# Patient Record
Sex: Female | Born: 1950 | Race: Black or African American | Hispanic: No | Smoking: Never smoker
Health system: Southern US, Community
[De-identification: ages and names within clinical notes are randomized; demographics above are authoritative.]

## PROBLEM LIST (undated history)

## (undated) DIAGNOSIS — G8929 Other chronic pain: Secondary | ICD-10-CM

## (undated) DIAGNOSIS — M069 Rheumatoid arthritis, unspecified: Secondary | ICD-10-CM

## (undated) DIAGNOSIS — Z973 Presence of spectacles and contact lenses: Secondary | ICD-10-CM

## (undated) DIAGNOSIS — I7300 Raynaud's syndrome without gangrene: Secondary | ICD-10-CM

## (undated) DIAGNOSIS — M797 Fibromyalgia: Secondary | ICD-10-CM

## (undated) DIAGNOSIS — M170 Bilateral primary osteoarthritis of knee: Secondary | ICD-10-CM

## (undated) DIAGNOSIS — K08109 Complete loss of teeth, unspecified cause, unspecified class: Secondary | ICD-10-CM

## (undated) DIAGNOSIS — I10 Essential (primary) hypertension: Secondary | ICD-10-CM

## (undated) DIAGNOSIS — M25512 Pain in left shoulder: Secondary | ICD-10-CM

## (undated) DIAGNOSIS — Z9889 Other specified postprocedural states: Secondary | ICD-10-CM

## (undated) DIAGNOSIS — F419 Anxiety disorder, unspecified: Secondary | ICD-10-CM

## (undated) DIAGNOSIS — Z9221 Personal history of antineoplastic chemotherapy: Secondary | ICD-10-CM

## (undated) DIAGNOSIS — R112 Nausea with vomiting, unspecified: Secondary | ICD-10-CM

## (undated) DIAGNOSIS — M5030 Other cervical disc degeneration, unspecified cervical region: Secondary | ICD-10-CM

## (undated) DIAGNOSIS — C50919 Malignant neoplasm of unspecified site of unspecified female breast: Secondary | ICD-10-CM

## (undated) DIAGNOSIS — Z972 Presence of dental prosthetic device (complete) (partial): Secondary | ICD-10-CM

## (undated) DIAGNOSIS — R55 Syncope and collapse: Secondary | ICD-10-CM

## (undated) HISTORY — PX: CHOLECYSTECTOMY: SHX55

## (undated) HISTORY — PX: COLONOSCOPY: SHX174

---

## 1978-10-13 HISTORY — PX: VAGINAL HYSTERECTOMY: SUR661

## 2005-10-11 ENCOUNTER — Emergency Department (HOSPITAL_COMMUNITY): Admission: EM | Admit: 2005-10-11 | Discharge: 2005-10-11 | Payer: Self-pay | Admitting: Emergency Medicine

## 2006-08-23 ENCOUNTER — Emergency Department (HOSPITAL_COMMUNITY): Admission: EM | Admit: 2006-08-23 | Discharge: 2006-08-23 | Payer: Self-pay | Admitting: Emergency Medicine

## 2006-11-05 ENCOUNTER — Ambulatory Visit (HOSPITAL_COMMUNITY): Admission: RE | Admit: 2006-11-05 | Discharge: 2006-11-05 | Payer: Self-pay | Admitting: Obstetrics and Gynecology

## 2006-12-16 ENCOUNTER — Ambulatory Visit (HOSPITAL_COMMUNITY): Admission: RE | Admit: 2006-12-16 | Discharge: 2006-12-16 | Payer: Self-pay | Admitting: Gastroenterology

## 2007-07-04 ENCOUNTER — Emergency Department (HOSPITAL_COMMUNITY): Admission: EM | Admit: 2007-07-04 | Discharge: 2007-07-05 | Payer: Self-pay | Admitting: Emergency Medicine

## 2008-08-28 ENCOUNTER — Emergency Department (HOSPITAL_COMMUNITY): Admission: EM | Admit: 2008-08-28 | Discharge: 2008-08-28 | Payer: Self-pay | Admitting: Emergency Medicine

## 2008-08-29 ENCOUNTER — Encounter: Admission: RE | Admit: 2008-08-29 | Discharge: 2008-08-29 | Payer: Self-pay | Admitting: Internal Medicine

## 2008-12-27 ENCOUNTER — Emergency Department (HOSPITAL_COMMUNITY): Admission: EM | Admit: 2008-12-27 | Discharge: 2008-12-27 | Payer: Self-pay | Admitting: Family Medicine

## 2009-01-01 ENCOUNTER — Other Ambulatory Visit: Admission: RE | Admit: 2009-01-01 | Discharge: 2009-01-01 | Payer: Self-pay | Admitting: Obstetrics and Gynecology

## 2009-01-05 ENCOUNTER — Ambulatory Visit (HOSPITAL_COMMUNITY): Admission: RE | Admit: 2009-01-05 | Discharge: 2009-01-05 | Payer: Self-pay | Admitting: Obstetrics and Gynecology

## 2009-03-18 ENCOUNTER — Emergency Department (HOSPITAL_COMMUNITY): Admission: EM | Admit: 2009-03-18 | Discharge: 2009-03-18 | Payer: Self-pay | Admitting: Emergency Medicine

## 2009-08-29 ENCOUNTER — Encounter: Admission: RE | Admit: 2009-08-29 | Discharge: 2009-08-29 | Payer: Self-pay | Admitting: Family Medicine

## 2009-09-04 ENCOUNTER — Encounter: Admission: RE | Admit: 2009-09-04 | Discharge: 2009-09-04 | Payer: Self-pay | Admitting: Family Medicine

## 2010-03-04 ENCOUNTER — Ambulatory Visit: Payer: Self-pay | Admitting: Internal Medicine

## 2010-03-04 DIAGNOSIS — E119 Type 2 diabetes mellitus without complications: Secondary | ICD-10-CM | POA: Insufficient documentation

## 2010-03-04 DIAGNOSIS — R609 Edema, unspecified: Secondary | ICD-10-CM | POA: Insufficient documentation

## 2010-03-04 DIAGNOSIS — M2550 Pain in unspecified joint: Secondary | ICD-10-CM | POA: Insufficient documentation

## 2010-03-05 ENCOUNTER — Encounter: Payer: Self-pay | Admitting: Internal Medicine

## 2010-03-07 ENCOUNTER — Ambulatory Visit: Payer: Self-pay | Admitting: Internal Medicine

## 2010-03-07 DIAGNOSIS — R799 Abnormal finding of blood chemistry, unspecified: Secondary | ICD-10-CM | POA: Insufficient documentation

## 2010-03-07 LAB — CONVERTED CEMR LAB
CRP, High Sensitivity: 10.52 — ABNORMAL HIGH (ref 0.00–5.00)
Total CK: 104 units/L (ref 7–177)

## 2010-04-12 ENCOUNTER — Telehealth: Payer: Self-pay | Admitting: Internal Medicine

## 2010-06-21 ENCOUNTER — Emergency Department (HOSPITAL_COMMUNITY): Admission: EM | Admit: 2010-06-21 | Discharge: 2010-06-21 | Payer: Self-pay | Admitting: Emergency Medicine

## 2010-08-24 ENCOUNTER — Emergency Department (HOSPITAL_COMMUNITY): Admission: EM | Admit: 2010-08-24 | Discharge: 2010-08-24 | Payer: Self-pay | Admitting: Emergency Medicine

## 2010-09-26 ENCOUNTER — Encounter (INDEPENDENT_AMBULATORY_CARE_PROVIDER_SITE_OTHER): Payer: Self-pay | Admitting: Family Medicine

## 2010-09-26 LAB — CONVERTED CEMR LAB
ALT: 14 units/L (ref 0–35)
AST: 18 units/L (ref 0–37)
Albumin: 5.1 g/dL (ref 3.5–5.2)
Alkaline Phosphatase: 135 units/L — ABNORMAL HIGH (ref 39–117)
Anti Nuclear Antibody(ANA): NEGATIVE
BUN: 10 mg/dL (ref 6–23)
Basophils Absolute: 0 10*3/uL (ref 0.0–0.1)
Basophils Relative: 0 % (ref 0–1)
CO2: 26 meq/L (ref 19–32)
Calcium: 10 mg/dL (ref 8.4–10.5)
Chloride: 103 meq/L (ref 96–112)
Creatinine, Ser: 0.82 mg/dL (ref 0.40–1.20)
Eosinophils Absolute: 0.1 10*3/uL (ref 0.0–0.7)
Eosinophils Relative: 1 % (ref 0–5)
Glucose, Bld: 72 mg/dL (ref 70–99)
HCT: 47.2 % — ABNORMAL HIGH (ref 36.0–46.0)
Hemoglobin: 15.1 g/dL — ABNORMAL HIGH (ref 12.0–15.0)
Lymphocytes Relative: 27 % (ref 12–46)
Lymphs Abs: 1.9 10*3/uL (ref 0.7–4.0)
MCHC: 32 g/dL (ref 30.0–36.0)
MCV: 86.6 fL (ref 78.0–100.0)
Monocytes Absolute: 0.5 10*3/uL (ref 0.1–1.0)
Monocytes Relative: 7 % (ref 3–12)
Neutro Abs: 4.7 10*3/uL (ref 1.7–7.7)
Neutrophils Relative %: 65 % (ref 43–77)
Platelets: 206 10*3/uL (ref 150–400)
Potassium: 3.8 meq/L (ref 3.5–5.3)
RBC: 5.45 M/uL — ABNORMAL HIGH (ref 3.87–5.11)
RDW: 14.9 % (ref 11.5–15.5)
Rheumatoid fact SerPl-aCnc: <20 intl units/mL (ref 0–20)
Sodium: 143 meq/L (ref 135–145)
Total Bilirubin: 0.4 mg/dL (ref 0.3–1.2)
Total Protein: 8.8 g/dL — ABNORMAL HIGH (ref 6.0–8.3)
WBC: 7.2 10*3/uL (ref 4.0–10.5)

## 2010-10-24 ENCOUNTER — Ambulatory Visit: Admit: 2010-10-24 | Payer: Self-pay | Admitting: Nurse Practitioner

## 2010-11-10 LAB — CONVERTED CEMR LAB
ALT: 20 units/L (ref 0–35)
AST: 23 units/L (ref 0–37)
Albumin: 4 g/dL (ref 3.5–5.2)
Alkaline Phosphatase: 103 units/L (ref 39–117)
Anti Nuclear Antibody(ANA): NEGATIVE
BUN: 12 mg/dL (ref 6–23)
Basophils Absolute: 0 10*3/uL (ref 0.0–0.1)
Basophils Relative: 0.3 % (ref 0.0–3.0)
Bilirubin Urine: NEGATIVE
Bilirubin, Direct: 0.1 mg/dL (ref 0.0–0.3)
CO2: 32 meq/L (ref 19–32)
CRP, High Sensitivity: 12.19 — ABNORMAL HIGH (ref 0.00–5.00)
Calcium: 9.1 mg/dL (ref 8.4–10.5)
Chloride: 105 meq/L (ref 96–112)
Creatinine, Ser: 0.8 mg/dL (ref 0.4–1.2)
Eosinophils Absolute: 0.2 10*3/uL (ref 0.0–0.7)
Eosinophils Relative: 2 % (ref 0.0–5.0)
GFR calc non Af Amer: 89.95 mL/min (ref >60)
Glucose, Bld: 93 mg/dL (ref 70–99)
HCT: 38.6 % (ref 36.0–46.0)
Hemoglobin: 13 g/dL (ref 12.0–15.0)
Hgb A1c MFr Bld: 6 % (ref 4.6–6.5)
Ketones, ur: NEGATIVE mg/dL
Lymphocytes Relative: 25.6 % (ref 12.0–46.0)
Lymphs Abs: 2.1 10*3/uL (ref 0.7–4.0)
MCHC: 33.8 g/dL (ref 30.0–36.0)
MCV: 84.6 fL (ref 78.0–100.0)
Monocytes Absolute: 0.8 10*3/uL (ref 0.1–1.0)
Monocytes Relative: 9.1 % (ref 3.0–12.0)
Neutro Abs: 5.2 10*3/uL (ref 1.4–7.7)
Neutrophils Relative %: 63 % (ref 43.0–77.0)
Nitrite: NEGATIVE
Pap Smear: NORMAL
Platelets: 161 10*3/uL (ref 150.0–400.0)
Potassium: 4.3 meq/L (ref 3.5–5.1)
RBC: 4.56 M/uL (ref 3.87–5.11)
RDW: 13.5 % (ref 11.5–14.6)
Rheumatoid fact SerPl-aCnc: 20 intl units/mL (ref 0.0–20.0)
Sed Rate: 13 mm/hr (ref 0–22)
Sodium: 142 meq/L (ref 135–145)
Specific Gravity, Urine: <=1.005 (ref 1.000–1.030)
TSH: 1.04 microintl units/mL (ref 0.35–5.50)
Total Bilirubin: 0.7 mg/dL (ref 0.3–1.2)
Total Protein, Urine: NEGATIVE mg/dL
Total Protein: 7.2 g/dL (ref 6.0–8.3)
Urine Glucose: NEGATIVE mg/dL
Urobilinogen, UA: 0.2 (ref 0.0–1.0)
WBC: 8.3 10*3/uL (ref 4.5–10.5)
pH: 6 (ref 5.0–8.0)

## 2010-11-12 ENCOUNTER — Encounter: Payer: Self-pay | Admitting: Internal Medicine

## 2010-11-12 LAB — CONVERTED CEMR LAB
CRP: 1 mg/dL — ABNORMAL HIGH (ref <0.6)
HCV Ab: NEGATIVE
Hep A Total Ab: NEGATIVE
Hep B Core Total Ab: NEGATIVE
Hep B S Ab: NEGATIVE
Sed Rate: 4 mm/hr (ref 0–22)
TSH: 1.331 microintl units/mL (ref 0.350–4.500)
Total CK: 131 units/L (ref 7–177)
Vit D, 25-Hydroxy: 21 ng/mL — ABNORMAL LOW (ref 30–89)
Vitamin B-12: 303 pg/mL (ref 211–911)

## 2010-11-13 ENCOUNTER — Other Ambulatory Visit (HOSPITAL_COMMUNITY): Payer: Self-pay | Admitting: Family Medicine

## 2010-11-13 ENCOUNTER — Ambulatory Visit (HOSPITAL_COMMUNITY)
Admission: RE | Admit: 2010-11-13 | Discharge: 2010-11-13 | Disposition: A | Discharge status: Home / Self Care | Payer: Self-pay | Source: Ambulatory Visit

## 2010-11-13 ENCOUNTER — Other Ambulatory Visit: Payer: Self-pay

## 2010-11-13 ENCOUNTER — Ambulatory Visit (HOSPITAL_COMMUNITY)
Admission: RE | Admit: 2010-11-13 | Discharge: 2010-11-13 | Disposition: A | Discharge status: Home / Self Care | Payer: Self-pay | Source: Ambulatory Visit | Attending: Family Medicine | Admitting: Family Medicine

## 2010-11-13 ENCOUNTER — Ambulatory Visit (HOSPITAL_COMMUNITY)
Admission: RE | Admit: 2010-11-13 | Discharge: 2010-11-13 | Disposition: A | Discharge status: Home / Self Care | Payer: Self-pay | Source: Ambulatory Visit | Attending: Internal Medicine | Admitting: Internal Medicine

## 2010-11-13 DIAGNOSIS — R52 Pain, unspecified: Secondary | ICD-10-CM

## 2010-11-13 DIAGNOSIS — M25549 Pain in joints of unspecified hand: Secondary | ICD-10-CM | POA: Insufficient documentation

## 2010-11-13 DIAGNOSIS — M25539 Pain in unspecified wrist: Secondary | ICD-10-CM | POA: Insufficient documentation

## 2010-11-14 NOTE — Letter (Signed)
Summary: Results Follow-up Letter  [facility]  [address] [place], [place] [address]   Phone: [phone]  Fax: [phone]    03/05/2010  [address] [place], [place]  [address]  Dear Ms. HEDGEPETH,   The following are the results of your recent test(s):  Test       Result     Liver/kidney     normal CBC         normal Urine         normal Inflammation test     high Blood sugars     very slightly elevated Thyroid       normal   _________________________________________________________  Please call for an appointment soon _________________________________________________________ _________________________________________________________ _________________________________________________________  Sincerely,  Sanda Linger MD [facility]

## 2010-11-14 NOTE — Assessment & Plan Note (Signed)
Summary: HAD A LETTER TO COME IN/NWS   Vital Signs:  Patient profile:   60 year old female Height:      60 inches Weight:      159.75 pounds BMI:     31.31 O2 Sat:      97 % on Room air Temp:     97.5 degrees F oral Pulse rate:   86 / minute Pulse rhythm:   regular Resp:     16 per minute BP sitting:   128 / 82  (left arm) Cuff size:   large  Vitals Entered By: Rock Nephew CMA (Mar 07, 2010 9:59 AM)  Nutrition Counseling: Patient's BMI is greater than 25 and therefore counseled on weight management options.  O2 Flow:  Room air  Primary Care Provider:  Etta Grandchild MD   History of Present Illness: She returns for f/up on joint  pains ( hands, elbows, feet, knees ) and a rare episode of muscle aches in her arms. Her CRP was slightly elevated but ANA and RF were negative. She has a hx. of OA and her sister has had surgery for OA in her knees.  Preventive Screening-Counseling & Management  Alcohol-Tobacco     Alcohol drinks/day: 0     Alcohol Counseling: not indicated; use of alcohol is not excessive or problematic     Smoking Status: never  Caffeine-Diet-Exercise     Does Patient Exercise: yes  Hep-HIV-STD-Contraception     Hepatitis Risk: no risk noted     HIV Risk: no risk noted     STD Risk: no risk noted      Sexual History:  currently monogamous.        Drug Use:  never and no.        Blood Transfusions:  no.    Current Medications (verified): 1)  Neurontin 100 Mg Caps (Gabapentin) .... 3 Tab Three Times A Day 2)  Potassium 3)  Vitamin D (Ergocalciferol) 50000 Unit Caps (Ergocalciferol) .... Once Weekly  Allergies (verified): 1)  ! Codeine  Past History:  Past Medical History: Reviewed history from 03/04/2010 and no changes required. Diabetes mellitus, type II  Past Surgical History: Cholecystectomy Hysterectomy Lumpectomy  Family History: Reviewed history and no changes required. Family History of Arthritis Family History Breast cancer  1st degree relative <50 Family History Diabetes 1st degree relative Family History High cholesterol Family History Hypertension  Social History: Reviewed history and no changes required. Occupation: CNA at [employer] Divorced Never Smoked Alcohol use-no Drug use-no Regular exercise-yes Drug Use:  never, no Does Patient Exercise:  yes  Review of Systems MS:  Complains of joint pain, muscle aches, and stiffness; denies joint redness, joint swelling, loss of strength, low back pain, cramps, muscle weakness, and thoracic pain.  Physical Exam  General:  alert, well-developed, well-nourished, well-hydrated, normal appearance, healthy-appearing, cooperative to examination, good hygiene, and overweight-appearing.   Mouth:  Oral mucosa and oropharynx without lesions or exudates.  Teeth in good repair. Neck:  supple, full ROM, no masses, no thyromegaly, no thyroid nodules or tenderness, no JVD, normal carotid upstroke, no carotid bruits, no cervical lymphadenopathy, and no neck tenderness.   Lungs:  normal respiratory effort, no intercostal retractions, no accessory muscle use, normal breath sounds, no dullness, no fremitus, no crackles, and no wheezes.   Heart:  normal rate, regular rhythm, no murmur, no gallop, no rub, and no JVD.   Abdomen:  soft, non-tender, normal bowel sounds, no distention, no masses, no guarding,  no rigidity, no rebound tenderness, no abdominal hernia, no hepatomegaly, and no splenomegaly.   Msk:  normal ROM, no joint tenderness, no joint swelling, no joint warmth, no redness over joints, no joint deformities, no joint instability, no crepitation, and no muscle atrophy.   Pulses:  R and L carotid,radial,femoral,dorsalis pedis and posterior tibial pulses are full and equal bilaterally Extremities:  No clubbing, cyanosis, edema, or deformity noted with normal full range of motion of all joints.   Neurologic:  No cranial nerve deficits noted. Station and gait are  normal. Plantar reflexes are down-going bilaterally. DTRs are symmetrical throughout. Sensory, motor and coordinative functions appear intact. Skin:  turgor normal, color normal, no rashes, no suspicious lesions, no ecchymoses, no petechiae, no purpura, no ulcerations, no edema, and tattoo(s).   Cervical Nodes:  no anterior cervical adenopathy and no posterior cervical adenopathy.   Axillary Nodes:  no R axillary adenopathy and no L axillary adenopathy.   Psych:  Cognition and judgment appear intact. Alert and cooperative with normal attention span and concentration. No apparent delusions, illusions, hallucinations  Diabetes Management Exam:    Foot Exam (with socks and/or shoes not present):       Sensory-Pinprick/Light touch:          Left medial foot (L-4): normal          Left dorsal foot (L-5): normal          Left lateral foot (S-1): normal          Right medial foot (L-4): normal          Right dorsal foot (L-5): normal          Right lateral foot (S-1): normal       Sensory-Monofilament:          Left foot: normal          Right foot: normal       Inspection:          Left foot: normal          Right foot: normal       Nails:          Left foot: normal          Right foot: normal   Impression & Recommendations:  Problem # 1:  C-REACTIVE PROTEIN, SERUM, ELEVATED (ICD-790.99) Assessment New  Orders: Venipuncture (78295) TLB-CRP-High Sensitivity (C-Reactive Protein) (86140-FCRP) TLB-CK Total Only(Creatine Kinase/CPK) (82550-CK) T-Hand Left 3 Views (73130TC)  Problem # 2:  ARTHRALGIA (ICD-719.40) Assessment: Unchanged try tramadol with the aleve she already takes. Orders: Venipuncture (62130) TLB-CRP-High Sensitivity (C-Reactive Protein) (86140-FCRP) TLB-CK Total Only(Creatine Kinase/CPK) (82550-CK) T-Hand Left 3 Views (73130TC)  Complete Medication List: 1)  Neurontin 100 Mg Caps (Gabapentin) .... 3 tab three times a day 2)  Potassium  3)  Vitamin D  (ergocalciferol) 50000 Unit Caps (Ergocalciferol) .... Once weekly 4)  Tramadol Hcl 50 Mg Tabs (Tramadol hcl) .Marland Kitchen.. 1-2 by mouth qid as needed for pain  Patient Instructions: 1)  Please schedule a follow-up appointment in 2 months. 2)  It is important that you exercise regularly at least 20 minutes 5 times a week. If you develop chest pain, have severe difficulty breathing, or feel very tired , stop exercising immediately and seek medical attention. 3)  You need to lose weight. Consider a lower calorie diet and regular exercise.  4)  Check your blood sugars regularly. If your readings are usually above 200 or below 70 you should contact our office. 5)  It is important that your Diabetic A1c level is checked every 3 months. 6)  See your eye doctor yearly to check for diabetic eye damage. 7)  Check your feet each night for sore areas, calluses or signs of infection. Prescriptions: TRAMADOL HCL 50 MG TABS (TRAMADOL HCL) 1-2 by mouth QID as needed for pain  #75 x 5   Entered and Authorized by:   Etta Grandchild MD   Signed by:   Etta Grandchild MD on 03/07/2010   Method used:   Print then Give to Patient   RxID:   [id number]

## 2010-11-14 NOTE — Progress Notes (Signed)
  Phone Note Call from Patient Call back at [facility] Phone [phone]   Caller: Patient Reason for Call: Lab or Test Results Summary of Call: Patietn called requesting recent lab and xray results. Please advise Initial call taken by: Rock Nephew CMA,  April 12, 2010 10:30 AM  Follow-up for Phone Call        inflammation test was elevated, xray was normal Follow-up by: Etta Grandchild MD,  April 12, 2010 10:40 AM  Additional Follow-up for Phone Call Additional follow up Details #1::        Left detailed vm for pt Additional Follow-up by: Lamar Sprinkles, CMA,  April 12, 2010 4:15 PM

## 2010-11-14 NOTE — Assessment & Plan Note (Signed)
Summary: NEW/ [employer] /NWS  #   Vital Signs:  Patient profile:   60 year old female Height:      60 inches Weight:      161.50 pounds BMI:     31.65 O2 Sat:      98 % on Room air Temp:     98.8 degrees F oral Pulse rate:   98 / minute Pulse rhythm:   regular Resp:     16 per minute BP sitting:   112 / 80  (left arm) Cuff size:   large  Vitals Entered By: Rock Nephew CMA (Mar 04, 2010 3:07 PM)  Nutrition Counseling: Patient's BMI is greater than 25 and therefore counseled on weight management options.  O2 Flow:  Room air CC: Upper and LoweBilateral extremity swelling and pain, Preventive Care Is Patient Diabetic? No   Primary Care Provider:  Etta Grandchild MD  CC:  Upper and LoweBilateral extremity swelling and pain and Preventive Care.  History of Present Illness: New to me she complains of a 2 month hx. of pain/swelling in hands (MCP joints), wrists, and elbows and some non-painful ankle swelling. She has been seen by ? Neurologist and though she says that her NCS/EMG was normal she has been treated for Neuropathy. No prior records are available today.  Preventive Screening-Counseling & Management  Alcohol-Tobacco     Alcohol drinks/day: 0     Smoking Status: never  Hep-HIV-STD-Contraception     Hepatitis Risk: no risk noted     HIV Risk: no risk noted     STD Risk: no risk noted      Sexual History:  currently monogamous.        Drug Use:  never.        Blood Transfusions:  no.    Medications Prior to Update: 1)  None  Current Medications (verified): 1)  Neurontin 100 Mg Caps (Gabapentin) .... 3 Tab Three Times A Day 2)  Potassium 3)  Vitamin D (Ergocalciferol) 50000 Unit Caps (Ergocalciferol) .... Once Weekly  Allergies (verified): 1)  ! Codeine  Past History:  Past Medical History: Diabetes mellitus, type II  Social History: Smoking Status:  never Hepatitis Risk:  no risk noted HIV Risk:  no risk noted STD Risk:  no risk noted Sexual  History:  currently monogamous Drug Use:  never Blood Transfusions:  no  Review of Systems       The patient complains of peripheral edema.  The patient denies anorexia, fever, weight loss, weight gain, chest pain, syncope, dyspnea on exertion, prolonged cough, headaches, hemoptysis, abdominal pain, hematuria, muscle weakness, suspicious skin lesions, difficulty walking, and enlarged lymph nodes.   MS:  Complains of joint pain; denies joint redness, joint swelling, loss of strength, low back pain, mid back pain, muscle aches, cramps, muscle weakness, stiffness, and thoracic pain.  Physical Exam  General:  alert, well-developed, well-nourished, well-hydrated, normal appearance, healthy-appearing, cooperative to examination, good hygiene, and overweight-appearing.   Head:  normocephalic, atraumatic, no abnormalities observed, and no abnormalities palpated.   Eyes:  vision grossly intact, pupils equal, pupils round, pupils reactive to light, and pupils react to accomodation.   Mouth:  Oral mucosa and oropharynx without lesions or exudates.  Teeth in good repair. Neck:  supple, full ROM, no masses, no thyromegaly, no thyroid nodules or tenderness, no JVD, normal carotid upstroke, no carotid bruits, no cervical lymphadenopathy, and no neck tenderness.   Lungs:  normal respiratory effort, no intercostal retractions, no accessory muscle  use, normal breath sounds, no dullness, no fremitus, no crackles, and no wheezes.   Heart:  normal rate, regular rhythm, no murmur, no gallop, no rub, and no JVD.   Abdomen:  soft, non-tender, normal bowel sounds, no distention, no masses, no guarding, no rigidity, no rebound tenderness, no abdominal hernia, no hepatomegaly, and no splenomegaly.   Msk:  normal ROM, no joint tenderness, no joint swelling, no joint warmth, no redness over joints, no joint deformities, no joint instability, no crepitation, and no muscle atrophy.   Pulses:  R and L  carotid,radial,femoral,dorsalis pedis and posterior tibial pulses are full and equal bilaterally Extremities:  No clubbing, cyanosis, edema, or deformity noted with normal full range of motion of all joints.   Neurologic:  No cranial nerve deficits noted. Station and gait are normal. Plantar reflexes are down-going bilaterally. DTRs are symmetrical throughout. Sensory, motor and coordinative functions appear intact. Skin:  Intact without suspicious lesions or rashes Cervical Nodes:  No lymphadenopathy noted Psych:  Cognition and judgment appear intact. Alert and cooperative with normal attention span and concentration. No apparent delusions, illusions, hallucinations Additional Exam:  EKG is normal.  Diabetes Management Exam:    Foot Exam (with socks and/or shoes not present):       Sensory-Pinprick/Light touch:          Left medial foot (L-4): normal          Left dorsal foot (L-5): normal          Left lateral foot (S-1): normal          Right medial foot (L-4): normal          Right dorsal foot (L-5): normal          Right lateral foot (S-1): normal       Sensory-Monofilament:          Left foot: normal          Right foot: normal       Inspection:          Left foot: normal          Right foot: normal       Nails:          Left foot: normal          Right foot: normal   Impression & Recommendations:  Problem # 1:  EDEMA (ICD-782.3) Assessment New exam is normal today but will look for secondary causes Orders: Venipuncture (59563) TLB-BMP (Basic Metabolic Panel-BMET) (80048-METABOL) TLB-CBC Platelet - w/Differential (85025-CBCD) TLB-Hepatic/Liver Function Pnl (80076-HEPATIC) TLB-TSH (Thyroid Stimulating Hormone) (84443-TSH) TLB-A1C / Hgb A1C (Glycohemoglobin) (83036-A1C) TLB-Rheumatoid Factor (RA) (87564-PP) T-Antinuclear Antib (ANA) (29518-84166) TLB-CRP-High Sensitivity (C-Reactive Protein) (86140-FCRP) TLB-Sedimentation Rate (ESR) (85652-ESR) TLB-Udip w/ Micro  (81001-URINE) EKG w/ Interpretation (93000)  Problem # 2:  ARTHRALGIA (ICD-719.40) Assessment: New will look for inflammatory arhtropathy Orders: Venipuncture (06301) TLB-BMP (Basic Metabolic Panel-BMET) (80048-METABOL) TLB-CBC Platelet - w/Differential (85025-CBCD) TLB-Hepatic/Liver Function Pnl (80076-HEPATIC) TLB-TSH (Thyroid Stimulating Hormone) (84443-TSH) TLB-A1C / Hgb A1C (Glycohemoglobin) (83036-A1C) TLB-Rheumatoid Factor (RA) (60109-NA) T-Antinuclear Antib (ANA) (35573-22025) TLB-CRP-High Sensitivity (C-Reactive Protein) (86140-FCRP) TLB-Sedimentation Rate (ESR) (85652-ESR) TLB-Udip w/ Micro (81001-URINE)  Problem # 3:  DIABETES MELLITUS, TYPE II (ICD-250.00) Assessment: Unchanged  Orders: Venipuncture (42706) TLB-BMP (Basic Metabolic Panel-BMET) (80048-METABOL) TLB-CBC Platelet - w/Differential (85025-CBCD) TLB-Hepatic/Liver Function Pnl (80076-HEPATIC) TLB-TSH (Thyroid Stimulating Hormone) (84443-TSH) TLB-A1C / Hgb A1C (Glycohemoglobin) (83036-A1C) TLB-Rheumatoid Factor (RA) (23762-GB) T-Antinuclear Antib (ANA) (15176-16073) TLB-CRP-High Sensitivity (C-Reactive Protein) (86140-FCRP) TLB-Sedimentation Rate (ESR) (85652-ESR) TLB-Udip w/ Micro (81001-URINE)  Complete Medication List: 1)  Neurontin 100 Mg Caps (Gabapentin) .... 3 tab three times a day 2)  Potassium  3)  Vitamin D (ergocalciferol) 50000 Unit Caps (Ergocalciferol) .... Once weekly  PAP Screening:    Hx Cervical Dysplasia in last 5 yrs? No    3 normal PAP smears in last 5 yrs? Yes    Last PAP smear:  01/11/2009  Mammogram Screening:    Last Mammogram:  03/13/2009  Osteoporosis Risk Assessment:  Risk Factors for Fracture or Low Bone Density:   Smoking status:       never  Patient Instructions: 1)  Limit your Sodium (Salt) to less than 4 grams a day (slightly less than 1 teaspoon) to prevent fluid retention, swelling, or worsening or symptoms. 2)  It is important that you exercise regularly at  least 20 minutes 5 times a week. If you develop chest pain, have severe difficulty breathing, or feel very tired , stop exercising immediately and seek medical attention. 3)  You need to lose weight. Consider a lower calorie diet and regular exercise.  4)  Check your Blood Pressure regularly. If it is above 140/90: you should make an appointment.  Preventive Care Screening  Colonoscopy:    Date:  03/13/2009    Results:  normal   Mammogram:    Date:  03/13/2009    Results:  normal   Pap Smear:    Date:  01/11/2009    Results:  normal

## 2010-12-26 LAB — GLUCOSE, CAPILLARY: Glucose-Capillary: 112 mg/dL — ABNORMAL HIGH (ref 70–99)

## 2011-01-31 ENCOUNTER — Other Ambulatory Visit (HOSPITAL_COMMUNITY): Payer: Self-pay | Admitting: Family Medicine

## 2011-01-31 DIAGNOSIS — R52 Pain, unspecified: Secondary | ICD-10-CM

## 2011-02-28 NOTE — Op Note (Signed)
NAMEEMMALIA, Angela Gross           ACCOUNT NO.:  [id number]   MEDICAL RECORD NO.:  [id number]          PATIENT TYPE:  AMB   LOCATION:  ENDO                         FACILITY:  [facility]   PHYSICIAN:  Anselmo Rod, M.D.  DATE OF BIRTH:  09-Oct-1951   DATE OF PROCEDURE:  12/16/2006  DATE OF DISCHARGE:                               OPERATIVE REPORT   PROCEDURE PERFORMED:  Screening colonoscopy.   ENDOSCOPIST:  Anselmo Rod, M.D.   INSTRUMENT USED:  Pentax video colonoscope.   INDICATIONS FOR PROCEDURE:  The patient is a 60 year old African-  American female undergoing screening colonoscopy, rule out colonic  polyps, masses, etc.   PREPROCEDURE PREPARATION:  Informed consent was procured from the  patient. The patient was fasted for eight hours prior to the procedure  and prepped with a gallon of Trilyte night prior to the procedure.  The  risks and benefits of the procedure including a 10% miss rate for cancer  and polyps was discussed with the patient as well.   PREPROCEDURE PHYSICAL:  The patient had stable vital signs.  Neck  supple.  Chest clear to auscultation.  S1 and S2 regular.  Abdomen soft  with normal bowel sounds.   DESCRIPTION OF PROCEDURE:  The patient was placed in left lateral  decubitus position and sedated with 50 mcg of Fentanyl and 4 mg of  Versed in slow incremental doses. Once the patient was adequately  sedated and maintained on low flow oxygen and continuous cardiac  monitoring, the Pentax video colonoscope was advanced from the rectum to  the cecum with difficulty.  The patient's position was changed from left  lateral decubitus position to the supine and then the right lateral  decubitus position with gentle application of abdominal pressure to  reach the cecal base. There was a large amount of residual stool in the  colon.  Multiple washes were done.  The appendicular orifice and  ileocecal valve were clearly visualized and photographed.  The  terminal  ileum was briefly visualized and appeared normal.  No masses, polyps,  erosions, ulcerations or diverticula were seen.  Small internal  hemorrhoids were appreciated on retroflexion in the rectum.  Small  lesions could be missed secondary to relatively poor prep.   IMPRESSION:  1. Small internal hemorrhoids seen on retroflexion.  2. No masses, polyps, or diverticula were seen.  3. Large amount of residual stool in the colon.  Small lesions could      be missed.  4. Normal terminal ileum.   RECOMMENDATIONS:  1. Continue high fiber diet with liberal fluid intake.  2. Use Colace 100 to 200 mg at bedtime to help with bouts of      constipation and if this does not help, MiraLax 17 g mixed in water      is advised two to three times a day.  3. Outpatient followup as need arises in the future. Repeat      colonoscopy planned in the next five years.  If the patient has any      abnormal symptoms in the interim, she should contact the office  immediately for further recommendations.      Anselmo Rod, M.D.  Electronically Signed     JNM/MEDQ  D:  12/16/2006  T:  12/16/2006  Job:  [id number]   cc:   Edwena Felty. Romine, M.D.  Betti D. Pecola Leisure, M.D.

## 2011-07-24 LAB — DIFFERENTIAL
Basophils Absolute: 0
Basophils Relative: 0
Eosinophils Absolute: 0.1
Eosinophils Relative: 1
Lymphocytes Relative: 30
Lymphs Abs: 2.4
Monocytes Absolute: 0.4
Monocytes Relative: 5
Neutro Abs: 5
Neutrophils Relative %: 64

## 2011-07-24 LAB — BASIC METABOLIC PANEL WITH GFR
BUN: 13
CO2: 26
Chloride: 109
Creatinine, Ser: 0.91
Glucose, Bld: 92

## 2011-07-24 LAB — CBC
HCT: 39
Hemoglobin: 13.1
MCHC: 33.7
MCV: 81.7
Platelets: 165
RBC: 4.77
RDW: 13.9
WBC: 7.8

## 2011-07-24 LAB — BASIC METABOLIC PANEL
Calcium: 9.4
GFR calc Af Amer: >60
GFR calc non Af Amer: >60
Potassium: 3.7
Sodium: 141

## 2011-09-20 ENCOUNTER — Encounter: Payer: Self-pay | Admitting: Nurse Practitioner

## 2011-09-20 ENCOUNTER — Emergency Department (HOSPITAL_COMMUNITY)
Admission: EM | Admit: 2011-09-20 | Discharge: 2011-09-20 | Disposition: A | Discharge status: Home / Self Care | Payer: Self-pay | Attending: Emergency Medicine | Admitting: Emergency Medicine

## 2011-09-20 DIAGNOSIS — I10 Essential (primary) hypertension: Secondary | ICD-10-CM | POA: Insufficient documentation

## 2011-09-20 DIAGNOSIS — I7300 Raynaud's syndrome without gangrene: Secondary | ICD-10-CM | POA: Insufficient documentation

## 2011-09-20 DIAGNOSIS — M810 Age-related osteoporosis without current pathological fracture: Secondary | ICD-10-CM | POA: Insufficient documentation

## 2011-09-20 DIAGNOSIS — M2550 Pain in unspecified joint: Secondary | ICD-10-CM | POA: Insufficient documentation

## 2011-09-20 DIAGNOSIS — M129 Arthropathy, unspecified: Secondary | ICD-10-CM | POA: Insufficient documentation

## 2011-09-20 HISTORY — DX: Essential (primary) hypertension: I10

## 2011-09-20 HISTORY — DX: Raynaud's syndrome without gangrene: I73.00

## 2011-09-20 HISTORY — DX: Fibromyalgia: M79.7

## 2011-09-20 MED ORDER — IBUPROFEN 200 MG PO TABS
600.0000 mg | ORAL_TABLET | Freq: Once | ORAL | Status: AC
  Administered 2011-09-20: 600 mg via ORAL
  Filled 2011-09-20: qty 3

## 2011-09-20 MED ORDER — IBUPROFEN 600 MG PO TABS: 600.0000 mg | ORAL_TABLET | Freq: Four times a day (QID) | ORAL | Status: AC | PRN

## 2011-09-20 NOTE — ED Notes (Signed)
C/o joint pain all over body for weeks. Also noticed a lump in l side of neck last week that went away and then returned. Pain at the site of the lump.

## 2011-09-20 NOTE — ED Provider Notes (Signed)
History     CSN: [id number] Arrival date & time: 09/20/2011 10:48 AM   First MD Initiated Contact with Patient 09/20/11 1200      Chief Complaint  Patient presents with  . Joint Pain    (Consider location/radiation/quality/duration/timing/severity/associated sxs/prior treatment) The history is provided by the patient.  pt with hx OA and fibromyalgia c/o pain to bil elbow, wrists, knees and ankles for several months. No acute or abrupt change in symptoms. No fever or chills. No focal joint redness or swelling. Constant, dull, non radiating pain, no specific exacerbating or alleviating factors. Taking no medications currently for pain. No pcp.  Past Medical History  Diagnosis Date  . Fibromyalgia   . Arthritis   . Raynaud disease   . Osteoporosis   . Hypertension     Past Surgical History  Procedure Date  . Cholecystectomy     No family history on file.  History  Substance Use Topics  . Smoking status: Never Smoker   . Smokeless tobacco: Not on file  . Alcohol Use: No    OB History    Grav Para Term Preterm Abortions TAB SAB Ect Mult Living                  Review of Systems  Constitutional: Negative for fever and chills.  HENT: Negative for neck pain.   Eyes: Negative for redness.  Respiratory: Negative for shortness of breath.   Cardiovascular: Negative for chest pain.  Gastrointestinal: Negative for abdominal pain.  Genitourinary: Negative for flank pain.  Musculoskeletal: Negative for back pain.  Skin: Negative for rash.  Neurological: Negative for headaches.  Hematological: Does not bruise/bleed easily.  Psychiatric/Behavioral: Negative for confusion.    Allergies  Codeine  Home Medications  No current outpatient prescriptions on file.  BP 128/84  Pulse 96  Temp(Src) 97.9 F (36.6 C) (Oral)  Resp 18  Ht 5' (1.524 m)  Wt 150 lb (68.04 kg)  BMI 29.30 kg/m2  SpO2 98%  Physical Exam  Nursing note and vitals reviewed. Constitutional: She  appears well-developed and well-nourished. No distress.  Eyes: Conjunctivae are normal. No scleral icterus.  Neck: Neck supple. No tracheal deviation present.  Cardiovascular: Normal rate, regular rhythm, normal heart sounds and intact distal pulses.  Exam reveals no gallop and no friction rub.   No murmur heard. Pulmonary/Chest: Effort normal and breath sounds normal. No respiratory distress. She has no wheezes. She has no rales.  Abdominal: Normal appearance. She exhibits no distension.  Musculoskeletal: She exhibits no edema and no tenderness.       Good rom at bil elbows, wrists, knees and ankles without pain. No focal joint swelling or redness. Distal pulses palp.   Lymphadenopathy:    She has no cervical adenopathy.  Neurological: She is alert.  Skin: Skin is warm and dry. No rash noted.  Psychiatric: She has a normal mood and affect.    ED Course  Procedures (including critical care time)     MDM  Hx arthritis. C/o arthralgias x many months. rec motrin and pcp follow up. Motrin 600 mg po. Pt had noted 'knot' near left shoulder, no current nodule or mass palpable.        Suzi Roots, MD 09/20/11 1210

## 2011-10-31 ENCOUNTER — Other Ambulatory Visit: Payer: Self-pay | Admitting: Rheumatology

## 2011-10-31 DIAGNOSIS — R9389 Abnormal findings on diagnostic imaging of other specified body structures: Secondary | ICD-10-CM

## 2011-11-07 ENCOUNTER — Ambulatory Visit
Admission: RE | Admit: 2011-11-07 | Discharge: 2011-11-07 | Disposition: A | Discharge status: Home / Self Care | Payer: BC Managed Care – PPO | Source: Ambulatory Visit | Attending: Rheumatology | Admitting: Rheumatology

## 2011-11-07 DIAGNOSIS — R9389 Abnormal findings on diagnostic imaging of other specified body structures: Secondary | ICD-10-CM

## 2011-11-07 MED ORDER — IOHEXOL 300 MG/ML  SOLN
75.0000 mL | Freq: Once | INTRAMUSCULAR | Status: AC | PRN
  Administered 2011-11-07: 75 mL via INTRAVENOUS

## 2011-11-07 MED ORDER — IOHEXOL 300 MG/ML  SOLN: 75.0000 mL | Freq: Once | INTRAMUSCULAR | Status: AC | PRN

## 2011-11-13 ENCOUNTER — Other Ambulatory Visit: Payer: Self-pay | Admitting: Rheumatology

## 2011-11-13 DIAGNOSIS — M7989 Other specified soft tissue disorders: Secondary | ICD-10-CM

## 2011-11-14 ENCOUNTER — Ambulatory Visit
Admission: RE | Admit: 2011-11-14 | Discharge: 2011-11-14 | Disposition: A | Discharge status: Home / Self Care | Payer: BC Managed Care – PPO | Source: Ambulatory Visit | Attending: Rheumatology | Admitting: Rheumatology

## 2011-11-14 DIAGNOSIS — M7989 Other specified soft tissue disorders: Secondary | ICD-10-CM

## 2011-11-14 MED ORDER — GADOBENATE DIMEGLUMINE 529 MG/ML IV SOLN
14.0000 mL | Freq: Once | INTRAVENOUS | Status: AC | PRN
  Administered 2011-11-14: 14 mL via INTRAVENOUS

## 2011-12-10 ENCOUNTER — Other Ambulatory Visit: Payer: Self-pay | Admitting: Rheumatology

## 2011-12-10 DIAGNOSIS — M064 Inflammatory polyarthropathy: Secondary | ICD-10-CM

## 2011-12-18 ENCOUNTER — Ambulatory Visit
Admission: RE | Admit: 2011-12-18 | Discharge: 2011-12-18 | Disposition: A | Discharge status: Home / Self Care | Payer: BC Managed Care – PPO | Source: Ambulatory Visit | Attending: Rheumatology | Admitting: Rheumatology

## 2011-12-18 DIAGNOSIS — M064 Inflammatory polyarthropathy: Secondary | ICD-10-CM

## 2011-12-18 MED ORDER — GADOBENATE DIMEGLUMINE 529 MG/ML IV SOLN
15.0000 mL | Freq: Once | INTRAVENOUS | Status: AC | PRN
  Administered 2011-12-18: 15 mL via INTRAVENOUS

## 2012-01-27 IMAGING — CT CT CHEST W/ CM
2 of 3 series · 14 of 31 positions shown, 16 images · IV contrast (75CC OMNI 300)
Comparison: Chest x-ray of 10/29/2011 from [facility] [place]

CLINICAL DATA: Lung nodules questioned on chest x-ray at [facility] at
[place]

CT CHEST WITH CONTRAST
TECHNIQUE: Multidetector CT imaging of the chest was performed
following the standard protocol during bolus administration of
intravenous contrast.
Contrast: 1 OMNIPAQUE IOHEXOL 300 MG/ML IV SOLN, 75mL OMNIPAQUE
IOHEXOL 300 MG/ML IV SOLN

[Series 3: chest with · axial · 0.70mm/px · z∈[-227,-12]mm · 6 of 61 slices shown, 8 images]
[im 9/61  mediastinal]
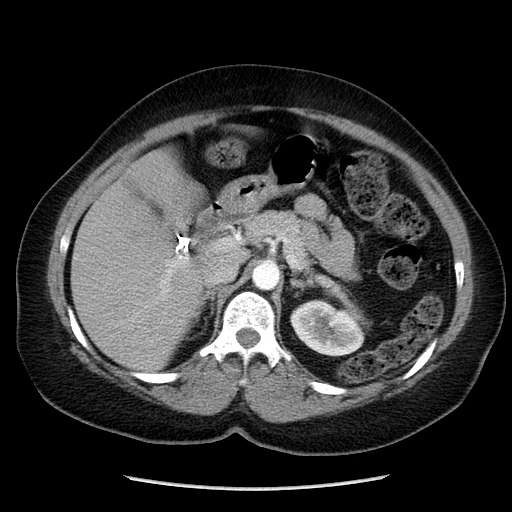
[im 9/61  lung]
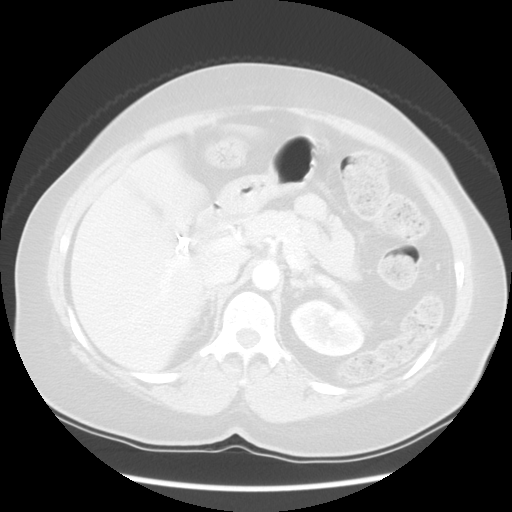
[im 18/61  lung]
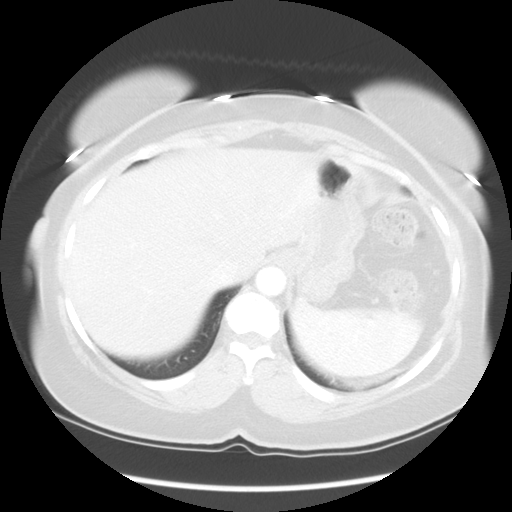
[im 26/61  lung]
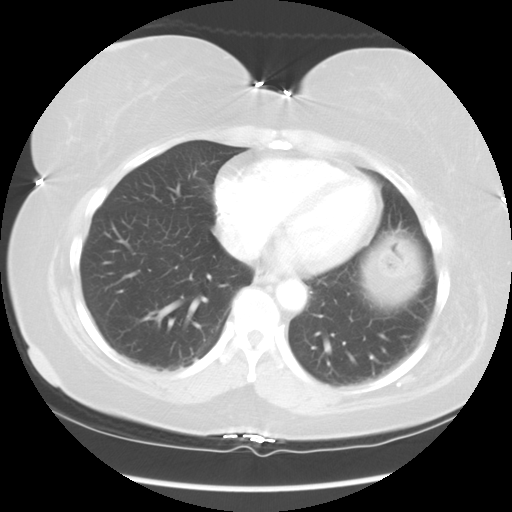
[im 35/61  lung]
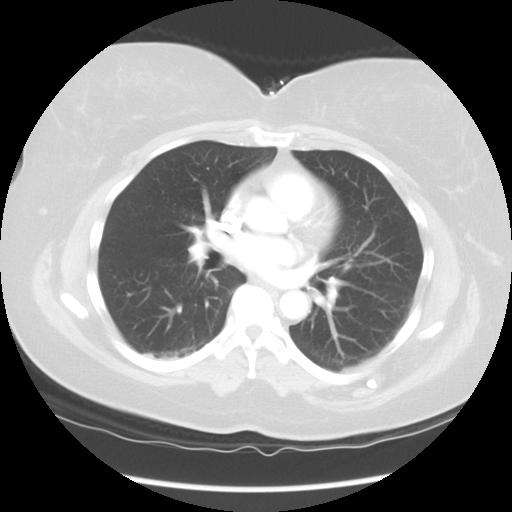
[im 43/61  mediastinal]
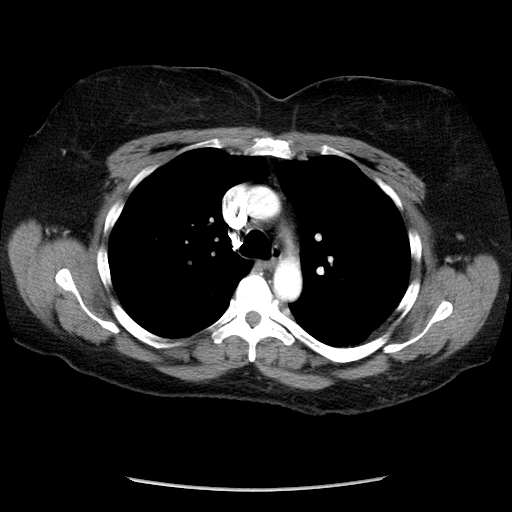
[im 43/61  lung]
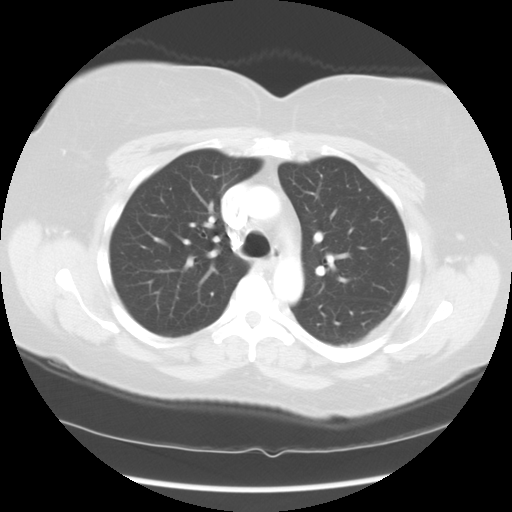
[im 52/61  lung]
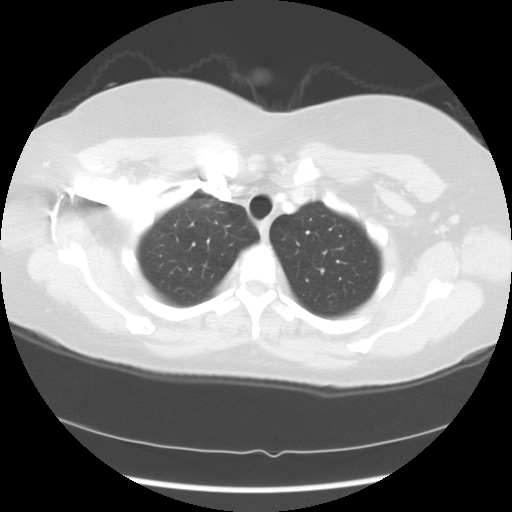

[Series 602: sagittal body · sagittal · 0.70mm/px · 8 of 145 slices shown]
[im 16/145  mediastinal]
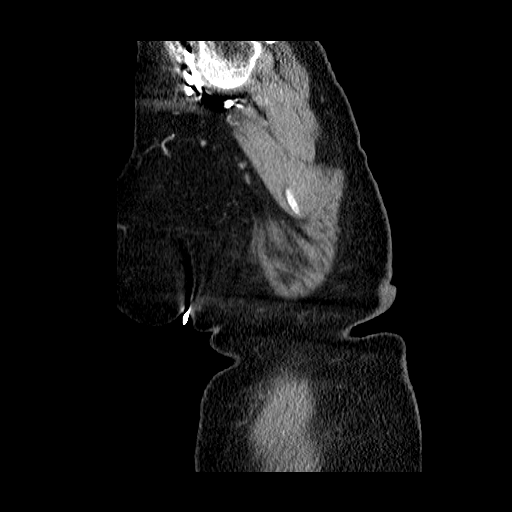
[im 31/145  mediastinal]
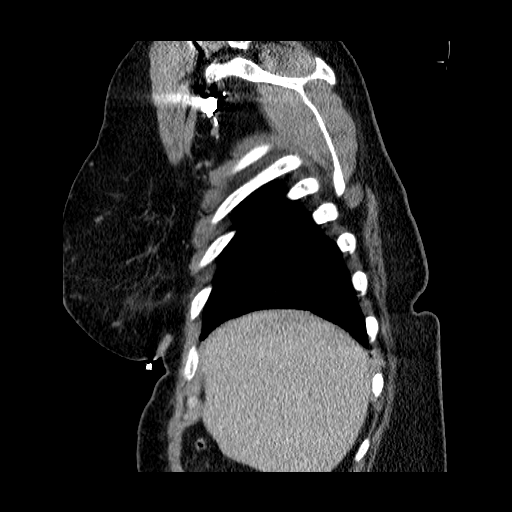
[im 46/145  mediastinal]
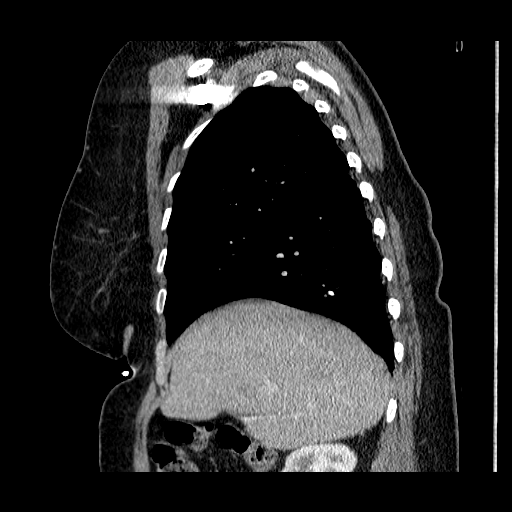
[im 69/145  mediastinal]
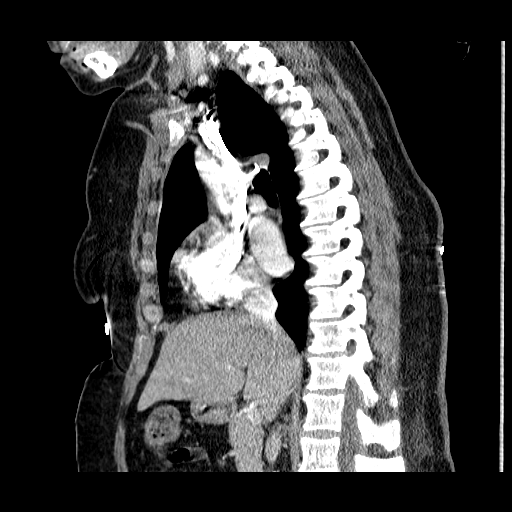
[im 76/145  mediastinal]
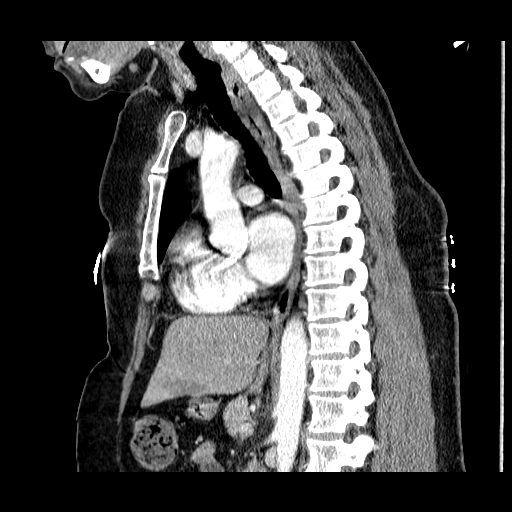
[im 99/145  mediastinal]
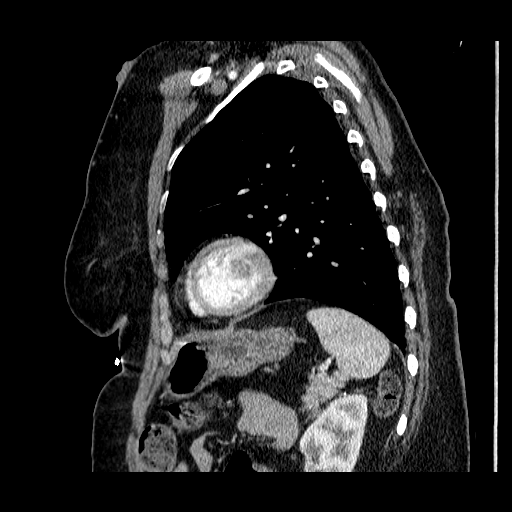
[im 114/145  mediastinal]
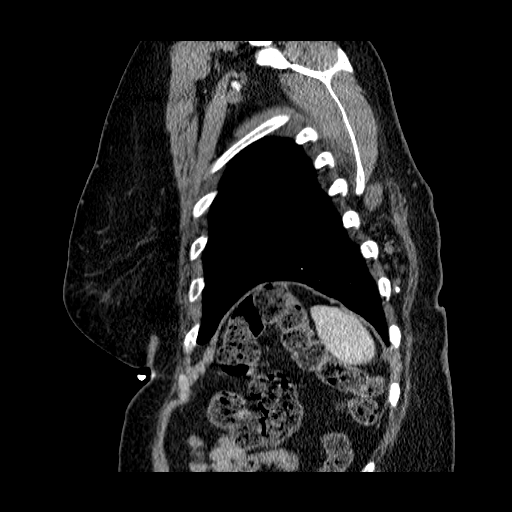
[im 129/145  mediastinal]
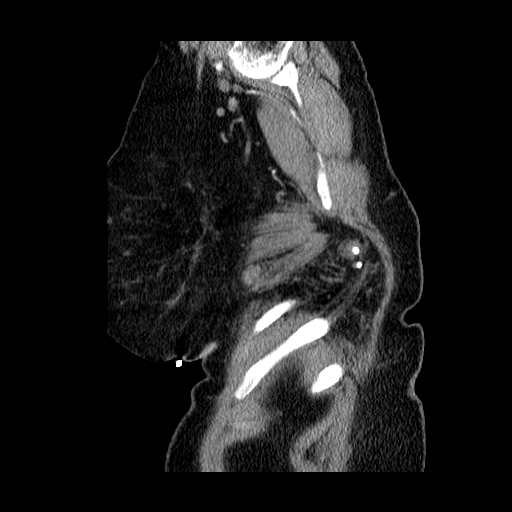

[14 of 31 positions shown; findings below may reference images not displayed]

FINDINGS: On the lung window images no lung parenchymal lesion is
seen.  No infiltrate is noted.  No pleural effusion is seen.

However, on soft tissue window images, the nodular opacity
questioned on chest x-ray corresponds to calcifications in the soft
tissues of the left back. These calcifications are associated with
a strandy soft tissue collection which is immediately below the
left lattissimus dorsally of the left back, slightly bulging the
contours of the left lattissimus dorsi.  This inhomogeneous soft
mass is largely fatty with strandiness and nodularity throughout as
well as a few dense calcifications.  This may represent a benign
entity termed elastofibroma dorsi, but MRI without and with
contrast is recommended to assess further and to exclude the
possibility of liposarcoma.

The pulmonary arteries and thoracic aorta opacify with no
significant abnormality.  No mediastinal or hilar adenopathy is
seen.  The origins of the great vessels are patent.  What is seen
of the upper abdomen is unremarkable with a probable cyst or
hemangioma within the spleen noted.
IMPRESSION: 1.  Soft tissue mass in the left back beneath the left lattissimus
dorsi muscle which may represent a benign soft tissue mass termed
elastofibroma dorsi.  However MRI is recommended without and with
contrast to assess further.
2.  No lung lesion is seen.

## 2012-01-31 ENCOUNTER — Encounter (HOSPITAL_COMMUNITY): Payer: Self-pay | Admitting: *Deleted

## 2012-01-31 ENCOUNTER — Emergency Department (HOSPITAL_COMMUNITY): Payer: Self-pay

## 2012-01-31 ENCOUNTER — Emergency Department (HOSPITAL_COMMUNITY)
Admission: EM | Admit: 2012-01-31 | Discharge: 2012-01-31 | Disposition: A | Discharge status: Home / Self Care | Payer: Self-pay | Attending: Emergency Medicine | Admitting: Emergency Medicine

## 2012-01-31 DIAGNOSIS — Z79899 Other long term (current) drug therapy: Secondary | ICD-10-CM | POA: Insufficient documentation

## 2012-01-31 DIAGNOSIS — R269 Unspecified abnormalities of gait and mobility: Secondary | ICD-10-CM | POA: Insufficient documentation

## 2012-01-31 DIAGNOSIS — S93401A Sprain of unspecified ligament of right ankle, initial encounter: Secondary | ICD-10-CM

## 2012-01-31 DIAGNOSIS — M129 Arthropathy, unspecified: Secondary | ICD-10-CM | POA: Insufficient documentation

## 2012-01-31 DIAGNOSIS — M25579 Pain in unspecified ankle and joints of unspecified foot: Secondary | ICD-10-CM | POA: Insufficient documentation

## 2012-01-31 DIAGNOSIS — R209 Unspecified disturbances of skin sensation: Secondary | ICD-10-CM | POA: Insufficient documentation

## 2012-01-31 DIAGNOSIS — IMO0001 Reserved for inherently not codable concepts without codable children: Secondary | ICD-10-CM | POA: Insufficient documentation

## 2012-01-31 DIAGNOSIS — I10 Essential (primary) hypertension: Secondary | ICD-10-CM | POA: Insufficient documentation

## 2012-01-31 DIAGNOSIS — S93409A Sprain of unspecified ligament of unspecified ankle, initial encounter: Secondary | ICD-10-CM | POA: Insufficient documentation

## 2012-01-31 DIAGNOSIS — M25476 Effusion, unspecified foot: Secondary | ICD-10-CM | POA: Insufficient documentation

## 2012-01-31 DIAGNOSIS — M810 Age-related osteoporosis without current pathological fracture: Secondary | ICD-10-CM | POA: Insufficient documentation

## 2012-01-31 DIAGNOSIS — M25473 Effusion, unspecified ankle: Secondary | ICD-10-CM | POA: Insufficient documentation

## 2012-01-31 DIAGNOSIS — I7300 Raynaud's syndrome without gangrene: Secondary | ICD-10-CM | POA: Insufficient documentation

## 2012-01-31 MED ORDER — HYDROCODONE-ACETAMINOPHEN 5-325 MG PO TABS
1.0000 | ORAL_TABLET | Freq: Once | ORAL | Status: AC
  Administered 2012-01-31: 1 via ORAL
  Filled 2012-01-31: qty 1

## 2012-01-31 MED ORDER — HYDROCODONE-ACETAMINOPHEN 5-325 MG PO TABS: 1.0000 | ORAL_TABLET | ORAL | Status: AC | PRN

## 2012-01-31 NOTE — ED Notes (Signed)
Ortho called for cam walker  

## 2012-01-31 NOTE — ED Provider Notes (Signed)
History     CSN: [id number]  Arrival date & time 01/31/12  1940   First MD Initiated Contact with Patient 01/31/12 2055      Chief Complaint  Patient presents with  . Ankle Pain    (Consider location/radiation/quality/duration/timing/severity/associated sxs/prior treatment) HPI Comments: Patient here after stepping off a step and twisting her right ankle today - pain to lateral malleolus with mild swelling.  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  Reports is unable to bear weight.  Patient is a 61 y.o. female presenting with ankle pain. The history is provided by the patient. No language interpreter was used.  Ankle Pain  The incident occurred 1 to 2 hours ago. The incident occurred at home. The injury mechanism was a fall. The pain is present in the right ankle. The quality of the pain is described as aching. The pain is at a severity of 6/10. The pain is moderate. The pain has been constant since onset. Associated symptoms include inability to bear weight. Pertinent negatives include no numbness, no loss of motion, no muscle weakness, no loss of sensation and no tingling. She reports no foreign bodies present. The symptoms are aggravated by nothing. She has tried nothing for the symptoms. The treatment provided no relief.    Past Medical History  Diagnosis Date  . Fibromyalgia   . Arthritis   . Raynaud disease   . Osteoporosis   . Hypertension     Past Surgical History  Procedure Date  . Cholecystectomy     No family history on file.  History  Substance Use Topics  . Smoking status: Never Smoker   . Smokeless tobacco: Not on file  . Alcohol Use: No    OB History    Grav Para Term Preterm Abortions TAB SAB Ect Mult Living                  Review of Systems  Musculoskeletal: Positive for joint swelling, arthralgias and gait problem. Negative for back pain.  Neurological: Negative for tingling and numbness.  All other systems reviewed  and are negative.    Allergies  Codeine  Home Medications   Current Outpatient Rx  Name Route Sig Dispense Refill  . FOLIC ACID 1 MG PO TABS Oral Take 1 mg by mouth every morning.    Marland Kitchen PREDNISOLONE 5 MG PO TABS Oral Take 15 mg by mouth every morning.    Marland Kitchen PRESCRIPTION MEDICATION Oral Take 1 tablet by mouth every 7 (seven) days. Pt takes a medication for rheumatoid arthritis every Wednesday but is unable to recall its name. [facility] pharmacy is closed & RX Capture did not yield any results.      BP 144/88  Pulse 79  Temp(Src) 98.1 F (36.7 C) (Oral)  Resp 16  SpO2 99%  Physical Exam  Nursing note and vitals reviewed. Constitutional: She is oriented to person, place, and time. She appears well-developed and well-nourished. No distress.  HENT:  Head: Normocephalic and atraumatic.  Right Ear: External ear normal.  Left Ear: External ear normal.  Nose: Nose normal.  Mouth/Throat: Oropharynx is clear and moist. No oropharyngeal exudate.  Eyes: Conjunctivae are normal. Pupils are equal, round, and reactive to light. No scleral icterus.  Neck: Normal range of motion. Neck supple.  Cardiovascular: Normal rate, regular rhythm and normal heart sounds.  Exam reveals no gallop and no friction rub.   No murmur heard. Pulmonary/Chest: Effort normal. No respiratory distress. She has no wheezes.  She has no rales. She exhibits no tenderness.  Abdominal: Soft. Bowel sounds are normal. She exhibits no distension. There is no tenderness.  Musculoskeletal:       Right ankle: She exhibits swelling. She exhibits normal range of motion, no ecchymosis, no deformity and normal pulse. tenderness. Lateral malleolus and posterior TFL tenderness found.  Lymphadenopathy:    She has no cervical adenopathy.  Neurological: She is alert and oriented to person, place, and time. No cranial nerve deficit.       Decreased sensation to toes, reports normal sensation to mid foot  Skin: Skin is warm and dry. No rash  noted. No erythema. No pallor.  Psychiatric: She has a normal mood and affect. Her behavior is normal. Judgment and thought content normal.    ED Course  Procedures (including critical care time)  Labs Reviewed - No data to display Dg Ankle Complete Right  01/31/2012  *RADIOLOGY REPORT*  Clinical Data: Pain after twisting injury.  RIGHT ANKLE - COMPLETE 3+ VIEW  Comparison: 12/27/2008  Findings: Mild posterior soft tissue swelling.  No fracture or dislocation.  Ankle mortise intact. Little change from priors.  IMPRESSION: Mild soft tissue swelling.  No fracture or dislocation.  Original Report Authenticated By: Elsie Stain, M.D.     Right ankle sprain    MDM  Patient here with right ankle sprain, chronic neuropathy, plan to place the patient in CAM walker because of pre-existing unsteadiness and give short course of pain control.        Izola Price [place], [place] 01/31/12 2137

## 2012-01-31 NOTE — ED Notes (Signed)
Pt st's she stepped off a step and twisted right ankle.  C/O pain and swelling to ankle.  ICE pack applied

## 2012-01-31 NOTE — Discharge Instructions (Signed)
Ankle Sprain An ankle sprain is an injury to the strong, fibrous tissues (ligaments) that hold the bones of your ankle joint together.  CAUSES Ankle sprain usually is caused by a fall or by twisting your ankle. People who participate in sports are more prone to these types of injuries.  SYMPTOMS  Symptoms of ankle sprain include:  Pain in your ankle. The pain may be present at rest or only when you are trying to stand or walk.   Swelling.   Bruising. Bruising may develop immediately or within 1 to 2 days after your injury.   Difficulty standing or walking.  DIAGNOSIS  Your caregiver will ask you details about your injury and perform a physical exam of your ankle to determine if you have an ankle sprain. During the physical exam, your caregiver will press and squeeze specific areas of your foot and ankle. Your caregiver will try to move your ankle in certain ways. An X-ray exam may be done to be sure a bone was not broken or a ligament did not separate from one of the bones in your ankle (avulsion).  TREATMENT  Certain types of braces can help stabilize your ankle. Your caregiver can make a recommendation for this. Your caregiver may recommend the use of medication for pain. If your sprain is severe, your caregiver may refer you to a surgeon who helps to restore function to parts of your skeletal system (orthopedist) or a physical therapist. HOME CARE INSTRUCTIONS  Apply ice to your injury for 1 to 2 days or as directed by your caregiver. Applying ice helps to reduce inflammation and pain.  Put ice in a plastic bag.   Place a towel between your skin and the bag.   Leave the ice on for 15 to 20 minutes at a time, every 2 hours while you are awake.   Take over-the-counter or prescription medicines for pain, discomfort, or fever only as directed by your caregiver.   Keep your injured leg elevated, when possible, to lessen swelling.   If your caregiver recommends crutches, use them as  instructed. Gradually, put weight on the affected ankle. Continue to use crutches or a cane until you can walk without feeling pain in your ankle.   If you have a plaster splint, wear the splint as directed by your caregiver. Do not rest it on anything harder than a pillow the first 24 hours. Do not put weight on it. Do not get it wet. You may take it off to take a shower or bath.   You may have been given an elastic bandage to wear around your ankle to provide support. If the elastic bandage is too tight (you have numbness or tingling in your foot or your foot becomes cold and blue), adjust the bandage to make it comfortable.   If you have an air splint, you may blow more air into it or let air out to make it more comfortable. You may take your splint off at night and before taking a shower or bath.   Wiggle your toes in the splint several times per day if you are able.  SEEK MEDICAL CARE IF:   You have an increase in bruising, swelling, or pain.   Your toes feel cold.   Pain relief is not achieved with medication.  SEEK IMMEDIATE MEDICAL CARE IF: Your toes are numb or blue or you have severe pain. MAKE SURE YOU:   Understand these instructions.   Will watch your condition.     Will get help right away if you are not doing well or get worse.  Document Released: 09/29/2005 Document Revised: 09/18/2011 Document Reviewed: 05/03/2008 Exit[employer] Patient Information 2012 [employer]. 

## 2012-01-31 NOTE — Progress Notes (Signed)
Orthopedic Tech Progress Note Patient Details:  Angela Gross 1950/10/25 [id number]  Other Ortho Devices Type of Ortho Device: CAM walker Ortho Device Location: (R) LE Ortho Device Interventions: Application   [name] 01/31/2012, 9:34 PM

## 2012-01-31 NOTE — ED Notes (Signed)
The pt stepped down a step and twisted her rt ankle earlier today. She has pain in her rt ankle.

## 2012-02-01 NOTE — ED Provider Notes (Signed)
Medical screening examination/treatment/procedure(s) were performed by non-physician practitioner and as supervising physician I was immediately available for consultation/collaboration.   Carleene Cooper III, MD 02/01/12 [phone]

## 2013-08-12 ENCOUNTER — Other Ambulatory Visit: Payer: Self-pay

## 2013-08-12 DIAGNOSIS — Z1231 Encounter for screening mammogram for malignant neoplasm of breast: Secondary | ICD-10-CM

## 2013-09-16 ENCOUNTER — Ambulatory Visit: Payer: BC Managed Care – PPO

## 2013-09-20 ENCOUNTER — Ambulatory Visit: Payer: BC Managed Care – PPO

## 2013-09-21 ENCOUNTER — Ambulatory Visit
Admission: RE | Admit: 2013-09-21 | Discharge: 2013-09-21 | Disposition: A | Discharge status: Home / Self Care | Payer: Medicare Other | Source: Ambulatory Visit

## 2013-09-21 DIAGNOSIS — Z1231 Encounter for screening mammogram for malignant neoplasm of breast: Secondary | ICD-10-CM

## 2013-09-29 ENCOUNTER — Other Ambulatory Visit: Payer: Self-pay | Admitting: Family

## 2013-09-29 DIAGNOSIS — R928 Other abnormal and inconclusive findings on diagnostic imaging of breast: Secondary | ICD-10-CM

## 2013-10-11 ENCOUNTER — Other Ambulatory Visit: Payer: Self-pay | Admitting: Family

## 2013-10-11 ENCOUNTER — Ambulatory Visit
Admission: RE | Admit: 2013-10-11 | Discharge: 2013-10-11 | Disposition: A | Discharge status: Home / Self Care | Payer: Medicare Other | Source: Ambulatory Visit | Attending: Family | Admitting: Family

## 2013-10-11 DIAGNOSIS — R928 Other abnormal and inconclusive findings on diagnostic imaging of breast: Secondary | ICD-10-CM

## 2013-10-12 ENCOUNTER — Other Ambulatory Visit: Payer: Self-pay | Admitting: Family

## 2013-10-13 HISTORY — PX: BREAST BIOPSY: SHX20

## 2013-10-13 HISTORY — PX: MASTECTOMY: SHX3

## 2013-10-13 HISTORY — PX: REDUCTION MAMMAPLASTY: SUR839

## 2013-10-19 ENCOUNTER — Other Ambulatory Visit: Payer: Self-pay | Admitting: Family

## 2013-10-19 ENCOUNTER — Ambulatory Visit
Admission: RE | Admit: 2013-10-19 | Discharge: 2013-10-19 | Disposition: A | Discharge status: Home / Self Care | Payer: Medicare Other | Source: Ambulatory Visit | Attending: Family | Admitting: Family

## 2013-10-19 DIAGNOSIS — R928 Other abnormal and inconclusive findings on diagnostic imaging of breast: Secondary | ICD-10-CM

## 2013-10-19 MED ORDER — GADOBENATE DIMEGLUMINE 529 MG/ML IV SOLN
15.0000 mL | Freq: Once | INTRAVENOUS | Status: AC | PRN
  Administered 2013-10-19: 15 mL via INTRAVENOUS

## 2013-10-20 ENCOUNTER — Encounter (INDEPENDENT_AMBULATORY_CARE_PROVIDER_SITE_OTHER): Payer: Self-pay

## 2013-10-20 ENCOUNTER — Encounter: Payer: Self-pay | Admitting: *Deleted

## 2013-10-20 ENCOUNTER — Encounter (INDEPENDENT_AMBULATORY_CARE_PROVIDER_SITE_OTHER): Payer: Self-pay | Admitting: General Surgery

## 2013-10-20 ENCOUNTER — Ambulatory Visit (INDEPENDENT_AMBULATORY_CARE_PROVIDER_SITE_OTHER): Payer: Medicare Other | Admitting: General Surgery

## 2013-10-20 VITALS — BP 127/89 | HR 70 | Temp 98.1°F | Resp 16 | Ht 60.0 in | Wt 157.4 lb

## 2013-10-20 DIAGNOSIS — C50119 Malignant neoplasm of central portion of unspecified female breast: Secondary | ICD-10-CM

## 2013-10-20 DIAGNOSIS — C50111 Malignant neoplasm of central portion of right female breast: Secondary | ICD-10-CM

## 2013-10-20 NOTE — Patient Instructions (Signed)
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 This cancer is estrogen receptor positive and HER-2/neu-positive.  You have biopsy proven invasive cancer in the central and lateral aspect of the right breast, as well as the inferior aspect of the breast. The x-rays are also suspicious in the medial aspect of the breast  He will ultimately need to have a right total mastectomy and sentinel node biopsy to control this disease. We have decided not to perform any other biopsies at this time.  You'll be referred to a medical oncologist for their opinion regarding management options, and whether you need to be given preoperative chemotherapy.  You will also be referred to a plastic surgeon to discuss reconstructive options. Return to see Dr. Dalbert Batman in approximately 3 weeks to make sure that your treatment plan is coming together.

## 2013-10-20 NOTE — Progress Notes (Signed)
Received referral in EPIC and took paperwork to Dr. Jana Hakim for an appt.  Emailed Clarise Cruz at [employer] to make her aware.

## 2013-10-20 NOTE — Progress Notes (Addendum)
Patient ID: Angela Gross, female   DOB: Jun 06, 1951, 63 y.o.   MRN: [id number]  Chief Complaint  Patient presents with  . Breast Problem    HPI Angela Gross is a 63 y.o. female.  She is referred by Dr. Jeanell Sparrow and ejection for evaluation and management of invasive ductal carcinoma of the right breast. Dr. [name] is her rheumatologist. Baruch Goldmann, P.A. At [facility] is her PCP.  The patient has no prior history of breast problems, and her last mammogram was 2010. She recently went for screening mammograms. The mammogram shows a 1.2 cm spiculated mass in the central right breast. There was other calcifications extending laterally spanning 1.6 cm. There were also some calcifications and extended inferiorly and medially and if all of these are malignant the transverse extent was said to be 6.5 cm.  An ultrasound revealed a 10 mm mass in the right breast at the 6:00 position. The axilla looked normal.  Image guided biopsy was performed of the right breast laterally and the right breast at 6:00 position. Both of these areas showed invasive ductal carcinoma. Both are strongly receptor positive and both are amplified for Her-2.   Ki-67  17%.  Subsequent MR shows mass and enhancement in the biopsy-proven area of malignancy in the anterior third at the 6:00 position and additionally in the slightly outer right breast. There is a large area of non-mass like enhancement in the right breast that is asymmetric compared to the left and suspicious for additional DCIS. I have discussed this with [name], and she states that if desired, a stereotactic biopsy of calcifications in the medial right breast could be performed to document multi-quadrant disease.  Patient is here today with her husband and daughter.  Family history is positive for breast cancer in the mother diagnosed in her 70s. No other breast or ovarian cancer in the family  Patient's comorbidities include rheumatoid arthritis,  Raynaud's phenomena, osteoarthritis, non-insulin-dependent diabetes mellitus. She's had a vaginal hysterectomy but ovaries were apparently retained. She is a significant keloid former and has multiple hypertrophic keloids  Social history reveals she is married has one daughter denies tobacco or alcohol. 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  We had a length discussion about the extent of her disease and what we do and don't know. She does not desire  any further biopsies and seems most interested in mastectomy. I also think that she would be best served by right total mastectomy and sentinel biopsy to control what I think is fairly extensive disease and that is our initial thought process. She is interested in plastic surgical consultation regarding reconstructive options and will be referred for that. We also talked about the medical oncology consultation which is appropriate given her breast diagnostic profile and HER-2/neu positive status. She is also interested in that.  HPI  Past Medical History  Diagnosis Date  . Fibromyalgia   . Arthritis   . Raynaud disease   . Osteoporosis   . Hypertension   . Diabetes mellitus without complication     Past Surgical History  Procedure Laterality Date  . Cholecystectomy      History reviewed. No pertinent family history.  Social History History  Substance Use Topics  . Smoking status: Never Smoker   . Smokeless tobacco: Not on file  . Alcohol Use: No    Allergies  Allergen Reactions  . Codeine Other (See Comments)     Altered mental status, Numbess  Current Outpatient Prescriptions  Medication Sig Dispense Refill  . folic acid (FOLVITE) 1 MG tablet Take 1 mg by mouth every morning.      . Methotrexate Sodium (METHOTREXATE PO) Take by mouth once a week. For rheumatoid arthritis every Wednesday      . prednisoLONE 5 MG TABS Take 15 mg by mouth every morning.       No current facility-administered medications for this  visit.    Review of Systems Review of Systems  Constitutional: Negative for fever, chills and unexpected weight change.  HENT: Negative for congestion, hearing loss, sore throat, trouble swallowing and voice change.   Eyes: Negative for visual disturbance.  Respiratory: Negative for cough and wheezing.   Cardiovascular: Negative for chest pain, palpitations and leg swelling.  Gastrointestinal: Negative for nausea, vomiting, abdominal pain, diarrhea, constipation, blood in stool, abdominal distention and anal bleeding.  Genitourinary: Negative for hematuria, vaginal bleeding and difficulty urinating.  Musculoskeletal: Positive for arthralgias and myalgias.       Reynauds.  Skin: Negative for rash and wound.  Neurological: Negative for seizures, syncope and headaches.  Hematological: Negative for adenopathy. Does not bruise/bleed easily.  Psychiatric/Behavioral: Negative for confusion. The patient is nervous/anxious.     Blood pressure 127/89, pulse 70, temperature 98.1 F (36.7 C), temperature source Temporal, resp. rate 16, height 5' (1.524 m), weight 157 lb 6.4 oz (71.396 kg).  Physical Exam Physical Exam  Constitutional: She is oriented to person, place, and time. She appears well-developed and well-nourished. No distress.  HENT:  Head: Normocephalic and atraumatic.  Nose: Nose normal.  Mouth/Throat: No oropharyngeal exudate.  Eyes: Conjunctivae and EOM are normal. Pupils are equal, round, and reactive to light. Left eye exhibits no discharge. No scleral icterus.  Neck: Neck supple. No JVD present. No tracheal deviation present. No thyromegaly present.  Cardiovascular: Normal rate, regular rhythm, normal heart sounds and intact distal pulses.   No murmur heard. Pulmonary/Chest: Effort normal and breath sounds normal. No respiratory distress. She has no wheezes. She has no rales. She exhibits no tenderness.  Breasts are large. 2 biopsy sites on the right. No significant hematoma  or mass. No axillary adenopathy on either side.  Abdominal: Soft. Bowel sounds are normal. She exhibits no distension and no mass. There is no tenderness. There is no rebound and no guarding.  Very large hypertrophic keloid right lateral chest wall, right lower quadrant of abdomen, left infraclavicular area.  Musculoskeletal: She exhibits no edema and no tenderness.  Lymphadenopathy:    She has no cervical adenopathy.  Neurological: She is alert and oriented to person, place, and time. She exhibits normal muscle tone. Coordination normal.  Skin: Skin is warm. No rash noted. She is not diaphoretic. No erythema. No pallor.  Psychiatric: She has a normal mood and affect. Her behavior is normal. Judgment and thought content normal.    Data Reviewed Multiple imaging studies. Pathology report including breast diagnostic profile of the 2 right breast biopsies. Phone conversation with [name] of the [facility].  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 The biopsy-proven and a radiographically suggested extent of her disease suggest that she would be best served by total mastectomy, sentinel nerve biopsy and with or without reconstruction  Rheumatoid arthritis on methotrexate  Reynaud's disease  Family history breast cancer in mother  Non-insulin-dependent diabetes mellitus  Extensive keloid formation  History of vaginal hysterectomy with retained ovaries     Plan  We had a very long talk about surgical issues, reconstructive issues, and medical oncology issues as well as radiation therapy issues. We agree that ultimately she will be best served by a right total mastectomy, sentinel node biopsy, with or without reconstruction.  She is interested in reconstructive options and will be referred to a plastic surgeon. Keloid formation could complicate reconstruction.  I have advised referral  to a medical  oncologist preop because of the possible extent of disease and most importantly because she is HER-2 positive. She is very much in favor of that. ADDENDUM: Case discussed in conference 10/26/2013. Dr. Jana Hakim will see her and states that she will need a port and herceptin based chemotherapy.  She is not interested in any further biopsies at this point in time. She is aware that the area in the medial right breast has not been biopsied yet.  Return to see me in 3 weeks to make sure that her treatment plan is coming together.        Edsel Petrin. Dalbert Batman, M.D., [facility] General and Minimally invasive Surgery Breast and Colorectal Surgery Office:   [phone] Pager:   [phone]  10/20/2013, 12:28 PM

## 2013-10-21 ENCOUNTER — Telehealth (INDEPENDENT_AMBULATORY_CARE_PROVIDER_SITE_OTHER): Payer: Self-pay | Admitting: *Deleted

## 2013-10-21 ENCOUNTER — Inpatient Hospital Stay: Admission: RE | Admit: 2013-10-21 | Payer: No Typology Code available for payment source | Source: Ambulatory Visit

## 2013-10-21 ENCOUNTER — Telehealth: Payer: Self-pay | Admitting: *Deleted

## 2013-10-21 NOTE — Telephone Encounter (Signed)
I spoke with pt and informed her of the appt with Dr. Iran Planas on 10/26/13 @ 3:30pm.  She has another appt this day and is unable to make that appt so I provided her with their phone number and she will call to reschedule.  I informed her of their address.  She is agreeable with this plan at this time.

## 2013-10-21 NOTE — Telephone Encounter (Signed)
Called and confirmed 10/26/13 appt w/ pt.  Unable to schedule Dr. Virgie Dad appt - emailed Audie Clear to enter or open.  Mailed before appt letter, welcome packet & intake form to pt.  Emailed Clarise Cruz at [employer] to make her aware.  Took paperwork to med rec for chart.

## 2013-10-25 ENCOUNTER — Other Ambulatory Visit: Payer: Self-pay | Admitting: *Deleted

## 2013-10-25 ENCOUNTER — Other Ambulatory Visit: Payer: Self-pay | Admitting: Physician Assistant

## 2013-10-25 DIAGNOSIS — Z853 Personal history of malignant neoplasm of breast: Secondary | ICD-10-CM

## 2013-10-25 DIAGNOSIS — Z170 Estrogen receptor positive status [ER+]: Secondary | ICD-10-CM

## 2013-10-25 DIAGNOSIS — C50311 Malignant neoplasm of lower-inner quadrant of right female breast: Secondary | ICD-10-CM

## 2013-10-25 HISTORY — DX: Personal history of malignant neoplasm of breast: Z85.3

## 2013-10-26 ENCOUNTER — Encounter: Payer: Self-pay | Admitting: Oncology

## 2013-10-26 ENCOUNTER — Encounter: Payer: Self-pay | Admitting: *Deleted

## 2013-10-26 ENCOUNTER — Other Ambulatory Visit (HOSPITAL_BASED_OUTPATIENT_CLINIC_OR_DEPARTMENT_OTHER): Payer: Medicare Other

## 2013-10-26 ENCOUNTER — Ambulatory Visit (HOSPITAL_BASED_OUTPATIENT_CLINIC_OR_DEPARTMENT_OTHER): Payer: Medicare Other | Admitting: Oncology

## 2013-10-26 ENCOUNTER — Ambulatory Visit: Payer: Medicare Other

## 2013-10-26 VITALS — BP 138/85 | HR 120 | Temp 97.7°F | Resp 18 | Ht 59.0 in | Wt 156.5 lb

## 2013-10-26 DIAGNOSIS — E119 Type 2 diabetes mellitus without complications: Secondary | ICD-10-CM

## 2013-10-26 DIAGNOSIS — C50919 Malignant neoplasm of unspecified site of unspecified female breast: Secondary | ICD-10-CM

## 2013-10-26 DIAGNOSIS — Z170 Estrogen receptor positive status [ER+]: Secondary | ICD-10-CM

## 2013-10-26 DIAGNOSIS — C50311 Malignant neoplasm of lower-inner quadrant of right female breast: Secondary | ICD-10-CM

## 2013-10-26 DIAGNOSIS — M2550 Pain in unspecified joint: Secondary | ICD-10-CM

## 2013-10-26 LAB — CBC WITH DIFFERENTIAL/PLATELET
BASO%: 0.7 % (ref 0.0–2.0)
Basophils Absolute: 0.1 10*3/uL (ref 0.0–0.1)
EOS%: 0.9 % (ref 0.0–7.0)
Eosinophils Absolute: 0.1 10*3/uL (ref 0.0–0.5)
HEMATOCRIT: 43.3 % (ref 34.8–46.6)
HGB: 14.3 g/dL (ref 11.6–15.9)
LYMPH%: 36.5 % (ref 14.0–49.7)
MCH: 27.5 pg (ref 25.1–34.0)
MCHC: 33 g/dL (ref 31.5–36.0)
MCV: 83.4 fL (ref 79.5–101.0)
MONO#: 0.8 10*3/uL (ref 0.1–0.9)
MONO%: 8.6 % (ref 0.0–14.0)
NEUT#: 5.1 10*3/uL (ref 1.5–6.5)
NEUT%: 53.3 % (ref 38.4–76.8)
Platelets: 169 10*3/uL (ref 145–400)
RBC: 5.2 10*6/uL (ref 3.70–5.45)
RDW: 13.5 % (ref 11.2–14.5)
WBC: 9.7 10*3/uL (ref 3.9–10.3)
lymph#: 3.5 10*3/uL — ABNORMAL HIGH (ref 0.9–3.3)

## 2013-10-26 LAB — COMPREHENSIVE METABOLIC PANEL (CC13)
ALT: 25 U/L (ref 0–55)
AST: 27 U/L (ref 5–34)
Albumin: 3.8 g/dL (ref 3.5–5.0)
Alkaline Phosphatase: 106 U/L (ref 40–150)
Anion Gap: 11 mEq/L (ref 3–11)
BILIRUBIN TOTAL: 0.43 mg/dL (ref 0.20–1.20)
BUN: 8.2 mg/dL (ref 7.0–26.0)
CHLORIDE: 104 meq/L (ref 98–109)
CO2: 27 mEq/L (ref 22–29)
CREATININE: 1 mg/dL (ref 0.6–1.1)
Calcium: 9.5 mg/dL (ref 8.4–10.4)
Glucose: 95 mg/dl (ref 70–140)
Potassium: 3.6 mEq/L (ref 3.5–5.1)
SODIUM: 142 meq/L (ref 136–145)
TOTAL PROTEIN: 7.9 g/dL (ref 6.4–8.3)

## 2013-10-26 NOTE — Progress Notes (Signed)
Checked in new patient with no financial issues. She has appt card and I gave her [facility] form

## 2013-10-26 NOTE — Progress Notes (Signed)
[place]  [phone] Fax:(336) [phone]     ID: Angela Gross OB: 31-Oct-1950  MR#: [id number]  DGL#:[id number]  PCP: Angela Gross GYN:   SU: Angela Gross OTHER MD: Angela Gross, Angela Gross  CHIEF COMPLAINT: "I just had a mammogram and it showed cancer".  HISTORY OF PRESENT ILLNESS: Angela Gross had routine screening mammography at the breast center 09/21/2013 showing a possible mass in calcifications in the right breast. On 10/11/2013 she underwent right diagnostic mammography and ultrasonography. This confirmed a spiculated 1.2 cm mass in the central right breast, with a group of pleomorphic calcifications slightly laterally. The calcifications spanning 1.6 cm. A second group of calcifications extended inferiorly and spanned 8 mm. The entire area in aggregate measured 6.5 cm. By physical exam there was no palpable abnormality. Ultrasonography showed an irregular hypoechoic mass at the 6:00 position of the right breast measuring 1 cm maximally. The right axilla was normal.  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 HER-2 was amplified, with a signals ratio of 3.44 and a copy number per cell 04 0.30.  Bilateral breast MRI 10/19/2013 showed again a spiculated mass in the 6:00 region of the right breast measuring 1.2 cm, a slightly more lateral hematoma associated with a second biopsy and also with clumped enhancement, and asymmetric none masslike enhancement in most of the right breast, the entire area of abnormality measuring 9.0 cm. The left breast was unremarkable and there were no abnormal appearing lymph nodes.  Her subsequent history is as detailed below    INTERVAL  HISTORY: Angela Gross was evaluated in the multidisciplinary breast cancer clinic 10/26/2013 accompanied by her daughter Angela Gross.  REVIEW OF SYSTEMS: There were no specific symptoms leading to the original mammogram, which was routinely scheduled. The patient denies unusual headaches, visual changes, nausea, vomiting, stiff neck, dizziness, or gait imbalance. There has been no cough, phlegm production, or pleurisy, no chest pain or pressure, and no change in bowel or bladder habits. The patient denies fever, rash, bleeding, unexplained fatigue or unexplained weight loss. She has occasional back and joint pain, which is long-standing, not severe, and not more intense or persistent than prior. A detailed review of systems was otherwise entirely negative.  PAST MEDICAL HISTORY: Past Medical History  Diagnosis Date  . Fibromyalgia   . Raynaud disease   . Osteoporosis   . Hypertension   . Diabetes mellitus without complication   . Arthritis     on Remicade    PAST SURGICAL HISTORY: Past Surgical History  Procedure Laterality Date  . Cholecystectomy    . Vaginal hysterectomy      no salpingo-oophoretomu    FAMILY HISTORY No family history on file. The patient is little information about her father. Her mother died from a heart attack at the age of 71. She had been diagnosed with breast cancer in her late 68s. The patient had 4 brothers and 4 sisters. There is no other history of breast or ovarian cancer in the family to her knowledge.  GYNECOLOGIC HISTORY:  She does not recall her age of menarche. She underwent hysterectomy in her 45s. She did not receive hormone replacement. First live birth at 36. She was GX P2.  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 She is currently disabled secondary to her rheumatoid arthritis. Her husband Angela Gross is a retired Administrator. Son Angela Gross works in [place] as a Firefighter. Daughter Angela Gross is a Social worker.    ADVANCED DIRECTIVES: Not in place   HEALTH MAINTENANCE: History  Substance Use Topics  . Smoking status: Never Smoker   . Smokeless tobacco: Not on file  . Alcohol Use: No     Colonoscopy:  PAP:  Bone density: At [facility] 01/05/2009 showed osteopenia with a T score of -1.7  Lipid panel:  Allergies  Allergen Reactions  . Codeine Other (See Comments)     Altered mental status, Numbess    Current Outpatient Prescriptions  Medication Sig Dispense Refill  . cyclobenzaprine (FLEXERIL) 10 MG tablet Take 10 mg by mouth 3 (three) times daily as needed for muscle spasms.      . InFLIXimab (REMICADE IV) Inject 3 mg/kg into the vein once. At 0,2 and 6 weeks      . Methotrexate Sodium (METHOTREXATE PO) Take 5 tablets by mouth once a week. For rheumatoid arthritis every Wednesday      . traMADol (ULTRAM) 50 MG tablet Take 50 mg by mouth 2 (two) times daily as needed.      . folic acid (FOLVITE) 1 MG tablet Take 1 mg by mouth daily.      . Vitamin D, Ergocalciferol, (DRISDOL) 50000 UNITS CAPS capsule Take 50,000 Units by mouth every 7 (seven) days.       No current facility-administered medications for this visit.    OBJECTIVE: [name] in no acute distress Filed Vitals:   10/26/13 1557  BP: 138/85  Pulse: 120  Temp: 97.7 F (36.5 C)  Resp: 18     Body mass index is 31.59 kg/(m^2).    ECOG FS:0 - Asymptomatic  Ocular: Sclerae unicteric, pupils equal, round and reactive to light Ear-nose-throat: Oropharynx clear, dentition fair Lymphatic: No cervical or supraclavicular adenopathy Lungs no rales or rhonchi, good excursion bilaterally Heart regular rate and rhythm, no murmur appreciated Abd soft, nontender, positive bowel sounds MSK no focal spinal tenderness, no joint edema Neuro: non-focal, well-oriented, appropriate affect Breasts: I do not palpate a mass in the right  breast and there are no skin or nipple changes of concern. The right axilla was benign per the left breast was unremarkable.   LAB RESULTS:  CMP     Component Value Date/Time   NA 142 10/26/2013 1551   NA 143 09/26/2010 2106   K 3.6 10/26/2013 1551   K 3.8 09/26/2010 2106   CL 103 09/26/2010 2106   CO2 27 10/26/2013 1551   CO2 26 09/26/2010 2106   GLUCOSE 95 10/26/2013 1551   GLUCOSE 72 09/26/2010 2106   BUN 8.2 10/26/2013 1551   BUN 10 09/26/2010 2106   CREATININE 1.0 10/26/2013 1551   CREATININE 0.82 09/26/2010 2106   CALCIUM 9.5 10/26/2013 1551   CALCIUM 10.0 09/26/2010 2106   PROT 7.9 10/26/2013 1551   PROT 8.8* 09/26/2010 2106   ALBUMIN 3.8 10/26/2013 1551   ALBUMIN 5.1 09/26/2010 2106   AST 27 10/26/2013 1551   AST 18 09/26/2010 2106   ALT 25 10/26/2013 1551   ALT 14 09/26/2010 2106   ALKPHOS 106 10/26/2013 1551   ALKPHOS 135* 09/26/2010 2106   BILITOT 0.43 10/26/2013 1551   BILITOT 0.4 09/26/2010 2106   GFRNONAA  89.95 03/04/2010 0000   GFRAA  Value: >60        The eGFR has been calculated using the MDRD equation. This calculation has not been validated in all clinical 07/05/2007 0035    I No results found for this basename: SPEP,  UPEP,   kappa and lambda light chains    Lab Results  Component Value Date   WBC 9.7 10/26/2013   NEUTROABS 5.1 10/26/2013   HGB 14.3 10/26/2013   HCT 43.3 10/26/2013   MCV 83.4 10/26/2013   PLT 169 10/26/2013      Chemistry      Component Value Date/Time   NA 142 10/26/2013 1551   NA 143 09/26/2010 2106   K 3.6 10/26/2013 1551   K 3.8 09/26/2010 2106   CL 103 09/26/2010 2106   CO2 27 10/26/2013 1551   CO2 26 09/26/2010 2106   BUN 8.2 10/26/2013 1551   BUN 10 09/26/2010 2106   CREATININE 1.0 10/26/2013 1551   CREATININE 0.82 09/26/2010 2106      Component Value Date/Time   CALCIUM 9.5 10/26/2013 1551   CALCIUM 10.0 09/26/2010 2106   ALKPHOS 106 10/26/2013 1551   ALKPHOS 135* 09/26/2010 2106   AST 27 10/26/2013 1551   AST 18 09/26/2010  2106   ALT 25 10/26/2013 1551   ALT 14 09/26/2010 2106   BILITOT 0.43 10/26/2013 1551   BILITOT 0.4 09/26/2010 2106       No results found for this basename: LABCA2    No components found with this basename: LABCA125    No results found for this basename: INR,  in the last 168 hours  Urinalysis    Component Value Date/Time   COLORURINE LT. YELLOW 03/04/2010 0000   APPEARANCEUR CLEAR 03/04/2010 0000   LABSPEC <=1.005 03/04/2010 0000   PHURINE 6.0 03/04/2010 0000   BILIRUBINUR NEGATIVE 03/04/2010 0000   KETONESUR NEGATIVE 03/04/2010 0000   UROBILINOGEN 0.2 03/04/2010 0000   NITRITE NEGATIVE 03/04/2010 0000   LEUKOCYTESUR TRACE 03/04/2010 0000    STUDIES: US Breast Right  10/11/2013   CLINICAL DATA:  Recall from screening mammogram.  EXAM: DIGITAL DIAGNOSTIC  right breast MAMMOGRAM  ULTRASOUND right BREAST  COMPARISON:  Previous examinations.  ACR Breast Density Category b: There are scattered areas of fibroglandular density.  FINDINGS: Additional views of the right breast with magnification demonstrate a spiculated mass measuring 1.2 cm in size located within the central right breast. This is worrisome for invasive mammary carcinoma. In addition, there is a group of pleomorphic calcifications with linear forms located slightly laterally within the right breast worrisome for DCIS. These calcifications span 1.6 cm. There is also an additional group of calcifications extending slightly inferiorly and medially from the spiculated mass with vague increased density. This is also worrisome for possible DCIS or invasive mammary carcinoma. This group of calcifications is located 2 cm from the spiculated mass and span 8 mm. In aggregate, when measuring from the lateral calcifications to these more medial calcifications the distance spans 6.5 cm.  On physical exam there is no discrete palpable abnormality.  Ultrasound is performed, showing an irregular hypoechoic mass with shadowing located within the  right breast at the 6 o'clock position 1 cm from the nipple corresponding to the worrisome mammographic finding. This measures 10 x 9 x 9 mm in size.  Ultrasound of the right axilla demonstrates normal axillary contents and no evidence for adenopathy.  I discussed ultrasound-guided core biopsy of the spiculated mass located at the  6 o'clock position as well stereotactic biopsy of the most distant (laterally located) right breast calcifications with the patient. She would like to proceed. These biopsies will be performed and reported separately.  IMPRESSION: 1. 1 cm spiculated mass located within the right breast at 6 o'clock position 1 cm from the nipple worrisome for invasive mammary carcinoma. 2. Two groups of worrisome calcifications ( located laterally and medially) within the right breast as discussed above. These span 6.5 cm.  Tissue sampling of the spiculated mass via ultrasound-guided core biopsy and stereotactic core biopsy of the more laterally located calcifications will be performed today and reported separately. Depending on the results of these biopsies, and if breast conservation is planned, biopsy of the more medial calcifications may be needed, as well.  RECOMMENDATION: Right breast ultrasound-guided core biopsy. Right breast stereotactic core biopsy.  I have discussed the findings and recommendations with the patient. Results were also provided in writing at the conclusion of the visit. If applicable, a reminder letter will be sent to the patient regarding the next appointment.  BI-RADS CATEGORY  5: Highly suggestive of malignancy - appropriate action should be taken.   Electronically Signed   By: Luberta Robertson M.D.   On: 10/11/2013 15:30   Mr Breast Bilateral W Wo Contrast  10/19/2013   CLINICAL DATA:  Recent diagnosis of invasive ductal carcinoma with DCIS following a stereotactic core biopsy of calcifications in the lateral right breast. A 2nd biopsy, ultrasound-guided, of a mass within the  right breast at 6 o'clock position, demonstrated grade 2-3 invasive ductal carcinoma. Additionally, it is noted that there is a 2nd group of suspicious microcalcifications in the medial right breast that have not been biopsied.  EXAM: BILATERAL BREAST MRI WITH AND WITHOUT CONTRAST  LABS:  BUN and creatinine were obtained on site at [place] at  [address]  Results:  BUN 7 mg/dL,  Creatinine 0.9 mg/dL.  TECHNIQUE: Multiplanar, multisequence MR images of both breasts were obtained prior to and following the intravenous administration of 64m of MultiHance.  THREE-DIMENSIONAL MR IMAGE RENDERING ON INDEPENDENT WORKSTATION:  Three-dimensional MR images were rendered by post-processing of the original MR data on an independent workstation. The three-dimensional MR images were interpreted, and findings are reported in the following complete MRI report for this study. Three dimensional images were evaluated at the independent DynaCad workstation  COMPARISON:  [facility] and CT of the chest 11/07/2011.  FINDINGS: Breast composition: b. Scattered fibroglandular tissue  Background parenchymal enhancement: Mild  Right breast: In the anterior 3rd of the 5-6 o'clock region of the right breast is an enhancing spiculated mass with washout kinetics that measures approximately 1.2 x 0.9 x 1.0 cm. This contains biopsy clip artifact and corresponds to the mass in the 6 o'clock region of the right breast biopsied under ultrasound and shown to be invasive ductal carcinoma. At the same depth within the right breast, but slightly lateral to the nipple is a biopsy clip with surrounding small hematoma measuring approximate 1.5 cm. This corresponds to the biopsy site of suspicious calcifications performed under stereotactic guidance, (invasive ductal carcinoma and DCIS). Clumped enhancement is seen adjacent to this  biopsy hematoma.  There is diffusely asymmetric non-masslike and predominately ductal enhancement in the majority of the right breast, most prominent in the central right breast. Findings are suspicious for ductal carcinoma in situ greater in extent than expected  by mammography. On total, the asymmetric enhancement in the right breast compared to the left, which includes the biopsy sites, measures approximately 9.0 x 6.8 x 4.8 cm (AP by transverse by craniocaudal). No additional masslike areas of enhancement are seen in the right breast, other than the biopsied mass.  Left breast: No mass or abnormal enhancement.  Lymph nodes: No abnormal appearing lymph nodes.  Ancillary findings: There is a fat-containing mass with scattered nodular areas of T2 brightness centered within the musculature of the left lateral chest wall. This mass has previously been characterized by CT and MRI (reference the chest CT dated 11/07/2011 and MRI the chest 11/14/2011).  IMPRESSION: 1. Enhancing mass and clumped enhancement are seen in the biopsy-proven regions of malignancy in the anterior 3rd of the right breast, in the 5-6 o'clock region and in the slightly outer right breast. Including the biopsy proven areas of malignancy, there is a large area (9.0 x 6.8 x 4.8 cm) of non-masslike, ductal enhancement in the right breast that is asymmetric compared to the normal appearing left breast, and suspicious for additional mammographically occult DCIS. If further pathologic evaluation of extent of disease is required for surgical planning, stereotactic biopsy of the previously described 2nd cluster of suspicious microcalcifications in the medial breast is felt to be the most specific targeting site for diagnosis. If this 2nd area of calcifications were positive for malignancy, the biopsy proven area of malignancy would include two quadrants and would span 5-6 cm transverse dimension on mammography. Our office will contact the patient to  schedule a 2nd stereotactic biopsy. A voicemail was left with Dr. Dalbert Batman on 10/19/2012.  2. No evidence of malignancy in the left breast.  3. Previously described left chest wall mass, favored to be a hemangioma or angiolipoma, as described on prior studies of 2013.  RECOMMENDATION: Stereotactic biopsy of calcifications in the medial right breast to document extent of disease.  BI-RADS CATEGORY  4: Suspicious abnormality - biopsy should be considered.   Electronically Signed   By: [name] M.D.   On: 10/19/2013 11:51   Mm Digital Diagnostic Unilat R  10/11/2013   CLINICAL DATA:  Recall from screening mammogram.  EXAM: DIGITAL DIAGNOSTIC  right breast MAMMOGRAM  ULTRASOUND right BREAST  COMPARISON:  Previous examinations.  ACR Breast Density Category b: There are scattered areas of fibroglandular density.  FINDINGS: Additional views of the right breast with magnification demonstrate a spiculated mass measuring 1.2 cm in size located within the central right breast. This is worrisome for invasive mammary carcinoma. In addition, there is a group of pleomorphic calcifications with linear forms located slightly laterally within the right breast worrisome for DCIS. These calcifications span 1.6 cm. There is also an additional group of calcifications extending slightly inferiorly and medially from the spiculated mass with vague increased density. This is also worrisome for possible DCIS or invasive mammary carcinoma. This group of calcifications is located 2 cm from the spiculated mass and span 8 mm. In aggregate, when measuring from the lateral calcifications to these more medial calcifications the distance spans 6.5 cm.  On physical exam there is no discrete palpable abnormality.  Ultrasound is performed, showing an irregular hypoechoic mass with shadowing located within the right breast at the 6 o'clock position 1 cm from the nipple corresponding to the worrisome mammographic finding. This measures 10 x 9 x 9 mm  in size.  Ultrasound of the right axilla demonstrates normal axillary contents and no evidence for adenopathy.  I  discussed ultrasound-guided core biopsy of the spiculated mass located at the 6 o'clock position as well stereotactic biopsy of the most distant (laterally located) right breast calcifications with the patient. She would like to proceed. These biopsies will be performed and reported separately.  IMPRESSION: 1. 1 cm spiculated mass located within the right breast at 6 o'clock position 1 cm from the nipple worrisome for invasive mammary carcinoma. 2. Two groups of worrisome calcifications ( located laterally and medially) within the right breast as discussed above. These span 6.5 cm.  Tissue sampling of the spiculated mass via ultrasound-guided core biopsy and stereotactic core biopsy of the more laterally located calcifications will be performed today and reported separately. Depending on the results of these biopsies, and if breast conservation is planned, biopsy of the more medial calcifications may be needed, as well.  RECOMMENDATION: Right breast ultrasound-guided core biopsy. Right breast stereotactic core biopsy.  I have discussed the findings and recommendations with the patient. Results were also provided in writing at the conclusion of the visit. If applicable, a reminder letter will be sent to the patient regarding the next appointment.  BI-RADS CATEGORY  5: Highly suggestive of malignancy - appropriate action should be taken.   Electronically Signed   By: Luberta Robertson M.D.   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 This is concordant with the imaging findings. The PATHOLOGY associated with the ultrasound-guided core  biopsy of the mass located within the right breast at the 6 o'clock position demonstrated grade 2-3 invasive ductal carcinoma. This is concordant with the imaging findings. I discussed the findings with the patient by telephone and answered her questions. The patient states that her biopsy sites are clean and dry without hematoma formation. There is mild tenderness. Post biopsy wound care instructions were reviewed with the patient. The patient is scheduled for breast MRI on 10/19/2013 at 7:15 a.m. The patient is scheduled for surgical consultation with Dr. Dalbert Batman on 10/20/2013 at 11:15 a.m. The patient was encouraged to call the breast center for additional questions or concerns.   Electronically Signed   By: Luberta Robertson M.D.   On: 10/12/2013 12:48   10/12/2013   CLINICAL DATA:  Right breast mass with worrisome right breast calcifications.  EXAM: RIGHT BREAST STEREOTACTIC CORE NEEDLE BIOPSY  COMPARISON:  Previous exams.  FINDINGS: The patient and I discussed the procedure of stereotactic-guided biopsy including benefits and alternatives. We discussed the high likelihood of a successful procedure. We discussed the risks of the procedure including infection, bleeding, tissue injury, clip migration, and inadequate sampling. Informed written consent was given. The usual time out protocol was performed immediately prior to the procedure.  Using sterile technique and 2% lidocaine as local anesthetic, under stereotactic guidance, a 9 gauge vacuum assisted core biopsy device was used to perform core needle biopsy of calcifications within the lateral right breast using an inferior craniocaudal approach. Specimen radiograph was performed showing multiple representative calcifications within the specimen. Tissue was sent for pathology.  At the conclusion of the procedure, a T-shaped tissue marker clip was deployed into the biopsy cavity. Follow-up 2-view mammogram confirmed clip to be in appropriate position. Also, the  ribbon shaped clip performed during ultrasound-guided core biopsy is in appropriate  position. Clips are separated by 3.5 cm.  IMPRESSION: Stereotactic-guided biopsy of right breast calcifications as discussed above. No apparent complications.  Depending on the results of the biopsies, and if breast conservation is planned, stereotactic biopsy of the more medial calcifications may be needed.  Electronically Signed: By: Luberta Robertson M.D. 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 This is concordant with the imaging findings. The PATHOLOGY associated with the ultrasound-guided core biopsy of the mass located within the right breast at the 6 o'clock position demonstrated grade 2-3 invasive ductal carcinoma. This is concordant with the imaging findings. I discussed the findings with the patient by telephone and answered her questions. The patient states that her biopsy sites are clean and dry without hematoma formation. There is mild tenderness. Post biopsy wound care instructions were reviewed with the patient. The patient is scheduled for breast MRI on 10/19/2013 at 7:15 a.m. The patient is scheduled for surgical consultation with Dr. Dalbert Batman on 10/20/2013 at 11:15 a.m. The patient was encouraged to call the breast center for additional questions or concerns.   Electronically Signed   By: Luberta Robertson M.D.   On: 10/12/2013 12:43   10/12/2013   CLINICAL DATA:  Right breast mass located at the 6 o'clock position.  EXAM: ULTRASOUND GUIDED right BREAST CORE NEEDLE BIOPSY  COMPARISON:  Previous exams.  FINDINGS: I met with the patient and we discussed the procedure of ultrasound-guided biopsy, including benefits and alternatives. We discussed  the high likelihood of a successful procedure. We discussed the risks of the procedure, including infection, bleeding, tissue injury, clip migration, and inadequate sampling. Informed written consent was given. The usual time-out protocol was performed immediately prior to the procedure.  Using sterile technique and 2% Lidocaine as local anesthetic, under direct ultrasound visualization, a 14 gauge core biopsy device was used to perform biopsy of the mass located within the right breast 6 o'clock position using a medial approach. At the conclusion of the procedure a ribbon shaped tissue marker clip was deployed into the biopsy cavity. Follow up 2 view mammogram was performed and dictated separately.  IMPRESSION: Ultrasound guided biopsy of the right breast mass located at the 6 o'clock position as discussed above. No apparent complications.  Electronically Signed: By: Luberta Robertson M.D. On: 10/11/2013 16:22    ASSESSMENT: 63 y.o. [place] woman s/p biopsy of separate Right breast masses 10/11/2013 for a clinical mT1 N0, stage IA invasive ductal carcinoma, grade II-III, one of the mass is being 85% estrogen receptor positive, progesterone receptor negative, with an MIB-1 of 17% and HER-2 amplified.  PLAN: We spent the better part of today's hour-long appointment discussing the biology of breast cancer in general, and the specifics of the patient's tumor in particular. I showed Josey the MRI images, and she could appreciate Y. our recommendation is for mastectomy, even though, if we are dealing with separate invasive masses, it could be that the largest single mass would be under 2 cm and her tumor might still be stage I.  Accordingly I cannot quote her a specific risk of recurrence with local treatment only, but she understands that whatever that risk is, chemotherapy will reduce it by one third, anti-HER-2 treatment will cut it in  half, and antiestrogen treatment will cut it again in half, so that she  will end up with 1/6 the baseline risk. For that reason I expect her prognosis will prove favorable once we have the pathology in hand to make specific predictions.  She understands in addition to the mastectomy and sentinel lymph node sampling she will need a port placed. She understands after surgery she will receive chemotherapy for 18 weeks concurrent with anti-HER-2 treatment, and that the anti-HER-2 treatment will be continued for 1 year. If she needs radiation, that would take 6-1/2 weeks and is started after chemotherapy. Once the chemotherapy and radiation are completed she will start her antiestrogen treatments.  I will work with her rheumatologist to see if she feels the patient needs to continue her methotrexate and Remicade through the chemotherapy, since chemotherapy itself can be profoundly immunosuppressive.  I expect the patient will have her surgery in about 3 weeks. Accordingly I am making her a return appointment with me early in March. She will need an echocardiogram and a visit with our "chemotherapy school" team prior to that visit. At that time she should be ready to start her systemic treatment.  Tamee has a good understanding of the overall plan and is in agreement with it. She knows the goal of treatment in her cases cure. She will call with any problems that may develop before next visit here.  Chauncey Cruel, MD   10/27/2013 6:43 PM

## 2013-10-26 NOTE — Progress Notes (Signed)
Completed chart.  Dawn checked on labs and they were already entered.  Placed chart in Dr. Virgie Dad box.

## 2013-10-27 ENCOUNTER — Encounter: Payer: Self-pay | Admitting: Oncology

## 2013-10-28 ENCOUNTER — Telehealth: Payer: Self-pay | Admitting: *Deleted

## 2013-10-28 ENCOUNTER — Encounter: Payer: Self-pay | Admitting: Oncology

## 2013-10-28 ENCOUNTER — Telehealth: Payer: Self-pay | Admitting: Oncology

## 2013-10-28 NOTE — Telephone Encounter (Signed)
Sent letter to Dr. Missy Sabins office from Dr. Jana Hakim.

## 2013-10-28 NOTE — Progress Notes (Signed)
Per [facility] [place] echos do not require precert.

## 2013-10-28 NOTE — Telephone Encounter (Signed)
Lm gv appt for 12/13/13 w/labs@4pm  and ov@ 4:30pm. i also made the pt aware that i will be calling back w/ echo appt. i gve echo order to [facility] for pre cert. Also made pt aware that i will mail a letter/avs...td

## 2013-11-01 ENCOUNTER — Telehealth: Payer: Self-pay | Admitting: *Deleted

## 2013-11-01 NOTE — Telephone Encounter (Signed)
Call received from [facility] from Dr Trudie Reed office wanting to give Dr Jannifer Rodney her recommendation per rhuematology medications.  Pt will stop Remicade now She will continue the MTX until 1 week prior to surgery.  Prednisone will be used for joint pain if needed.  Return call number for Dr Trudie Reed given as [phone].  This note will be given to MD .

## 2013-11-08 ENCOUNTER — Other Ambulatory Visit: Payer: Self-pay | Admitting: Oncology

## 2013-11-08 NOTE — Progress Notes (Unsigned)
We had a call from Dr. Trudie Reed telling us her planned regarding the patient's rheumatology medications. Dr. Trudie Reed has stopped Remicade. She will continue the methotrexate until one week before surgery. The patient will use prednisone as needed for joint pain. Dr. Trudie Reed and be reached at [phone] as needed.

## 2013-11-10 ENCOUNTER — Ambulatory Visit (INDEPENDENT_AMBULATORY_CARE_PROVIDER_SITE_OTHER): Payer: Medicare Other | Admitting: General Surgery

## 2013-11-10 ENCOUNTER — Encounter (INDEPENDENT_AMBULATORY_CARE_PROVIDER_SITE_OTHER): Payer: Self-pay | Admitting: General Surgery

## 2013-11-10 VITALS — BP 116/78 | HR 100 | Temp 97.7°F | Resp 15 | Ht 60.0 in | Wt 157.6 lb

## 2013-11-10 DIAGNOSIS — C50311 Malignant neoplasm of lower-inner quadrant of right female breast: Secondary | ICD-10-CM

## 2013-11-10 DIAGNOSIS — C50319 Malignant neoplasm of lower-inner quadrant of unspecified female breast: Secondary | ICD-10-CM

## 2013-11-10 NOTE — Patient Instructions (Signed)
You have seenWill. Dr. Jana Hakim, and he plans to give you chemotherapy after surgery. You Angela Gross need a Port-A-Cath inserted at the time of your mastectomy and we have discussed that.  You have seen Dr. Iran Planas, and she plans an immediate implant based reconstruction and excision of keloids at the time of her mastectomy.  You Angela Gross spend 2 nights in the hospital, most likely.  We Angela Gross coordinate your care with Dr. Iran Planas  Discontinue your Remicade 1-2 weeks prior to the surgery, if okay with Dr. [name].     Mastectomy, With or Without Reconstruction Mastectomy (removal of the breast) is a procedure most commonly used to treat cancer (tumor) of the breast. Different procedures are available for treatment. This depends on the stage of the tumor (abnormal growths). Discuss this with your caregiver, surgeon (a specialist for performing operations such as this), or oncologist (someone specialized in the treatment of cancer). With proper information, you can decide which treatment is best for you. Although the sound of the word cancer is frightening to all of [place], the new treatments and medications can be a source of reassurance and comfort. If there are things you are worried about, discuss them with your caregiver. He or she can help comfort you and your family. Some of the different procedures for treating breast cancer are:  Radical (extensive) mastectomy. This is an operation used to remove the entire breast, the muscles under the breast, and all of the glands (lymph nodes) under the arm. With all of the new treatments available for cancer of the breast, this procedure has become less common.  Modified radical mastectomy. This is a similar operation to the radical mastectomy described above. In the modified radical mastectomy, the muscles of the chest wall are not removed unless one of the lessor muscles is removed. One of the lessor muscles may be removed to allow better removal of the  lymph nodes. The axillary lymph nodes are also removed. Rarely, during an axillary node dissection nerves to this area are damaged. Radiation therapy is then often used to the area following this surgery.  A total mastectomy also known as a complete or simple mastectomy. It involves removal of only the breast. The lymph nodes and the muscles are left in place.  In a lumpectomy, the lump is removed from the breast. This is the simplest form of surgical treatment. A sentinel lymph node biopsy may also be done. Additional treatment may be required. RISKS AND COMPLICATIONS The main problems that follow removal of the breast include:  Infection (germs start growing in the wound). This can usually be treated with antibiotics (medications that kill germs).  Lymphedema. This means the arm on the side of the breast that was operated on swells because the lymph (tissue fluid) cannot follow the main channels back into the body. This only occurs when the lymph nodes have had to be removed under the arm.  There may be some areas of numbness to the upper arm and around the incision (cut by the surgeon) in the breast. This happens because of the cutting of or damage to some of the nerves in the area. This is most often unavoidable.  There may be difficulty moving the arm in a full range of motion (moving in all directions) following surgery. This usually improves with time following use and exercise.  Recurrence of breast cancer may happen with the very best of surgery and follow up treatment. Sometimes small cancer cells that cannot be seen with  the naked eye have already spread at the time of surgery. When this happens other treatment is available. This treatment may be radiation, medications or a combination of both. RECONSTRUCTION Reconstruction of the breast may be done immediately if there is not going to be post-operative radiation. This surgery is done for cosmetic (improve appearance) purposes to improve  the physical appearance after the operation. This may be done in two ways:  It can be done using a saline filled prosthetic (an artificial breast which is filled with salt water). Silicone breast implants are now re-approved by the FDA and are being commonly used.  Reconstruction can be done using the body's own muscle/fat/skin. Your caregiver Angela Gross discuss your options with you. Depending upon your needs or choice, together you Angela Gross be able to determine which procedure is best for you. Document Released: 06/24/2001 Document Revised: 06/23/2012 Document Reviewed: 02/15/2008 [facility] Patient Information 2014 [place].

## 2013-11-10 NOTE — Progress Notes (Signed)
Patient ID: Angela Gross, female   DOB: [birth year], 63 y.o.   MRN: [id number] History: Angela Gross is a 63 y.o. female. She returns for further evaluation and management of invasive ductal carcinoma of the right breast. Dr. [name] is her rheumatologist. Baruch Goldmann, P.A. At [facility] triad is her PCP.       She has seen Dr.  Jana Hakim, who has advised her to have chemotherapy and a Port-A-Cath insertion. She is in agreement with that.           She has seen Dr. Celedonio Miyamoto, who plans immediate implant based reconstruction and excision of keloids.       The patient has no prior history of breast problems, and her last mammogram was 2010. She recently went for screening mammograms. The mammogram shows a 1.2 cm spiculated mass in the central right breast. There was other calcifications extending laterally spanning 1.6 cm. There were also some calcifications and extended inferiorly and medially and if all of these are malignant the transverse extent was said to be 6.5 cm.  An ultrasound revealed a 10 mm mass in the right breast at the 6:00 position. The axilla looked normal.  Image guided biopsy was performed of the right breast laterally and the right breast at 6:00 position. Both of these areas showed invasive ductal carcinoma. Both are strongly receptor positive and both are amplified for Her-2. Ki-67 17%.  Subsequent MR shows mass and enhancement in the biopsy-proven area of malignancy in the anterior third at the 6:00 position and additionally in the slightly outer right breast. There is a large area of non-mass like enhancement in the right breast that is asymmetric compared to the left and suspicious for additional DCIS. I have discussed this with Angela Gross, and she states that if desired, a stereotactic biopsy of calcifications in the medial right breast could be performed to document multi-quadrant disease.  Patient is here today with her daughter.  Family history is positive for  breast cancer in the mother diagnosed in her 73s. No other breast or ovarian cancer in the family        Patient's comorbidities include rheumatoid arthritis, Raynaud's phenomena, osteoarthritis, non-insulin-dependent diabetes mellitus. She's had a vaginal hysterectomy but ovaries were apparently retained. She is a significant keloid former and has multiple hypertrophic keloids  Social history reveals she is married has one daughter denies tobacco or alcohol. 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       She is ready to go ahead and plan her surgery. We had a long discussion about Port-A-Cath insertion, a right total mastectomy, right axillary sentinel node biopsy, possible right axillary lymph node dissection. We discussed the indications, details, techniques, and numerous risks of all these procedures, including bleeding, infection, pneumothorax, skin necrosis, nerve damage, arm swelling, or numbness, catheter malfunction, and other unforeseen problems. She understands all these issues and all of her questions are answered. She agrees with this plan.   Past history, family history, social history, and review of systems are all documented on the chart, unchanged, and noncontributory except as described above.  Physical Exam: Constitutional: She is oriented to person, place, and time. She appears well-developed and well-nourished. No distress.  HENT:  Head: Normocephalic and atraumatic.    Neck supple. No JVD present. No tracheal deviation present. No thyromegaly present.  Cardiovascular: Normal rate, regular rhythm, normal heart sounds and intact distal pulses.  No murmur heard.  Pulmonary/Chest: Effort normal and breath sounds  normal. No respiratory distress. She has no wheezes. She has no rales. She exhibits no tenderness.  Breasts are large. 2 biopsy sites on the right. No significant hematoma or mass. No axillary adenopathy on either side.  Abdominal: Soft. Bowel sounds are normal.  She exhibits no distension and no mass. There is no tenderness. There is no rebound and no guarding.  Very large hypertrophic keloid right lateral chest wall, right lower quadrant of abdomen, left infraclavicular area.   Neurological: She is alert and oriented to person, place, and time. She exhibits normal muscle tone. Coordination normal.  Skin: Skin is warm. No rash noted. She is not diaphoretic. No erythema. No pallor.  Psychiatric: She has a normal mood and affect. Her behavior is normal. Judgment and thought content normal.    Assessment  Multifocal and probably multicentric invasive ductal carcinoma right breast, receptor positive, HER-2/neu-positive. The biopsy-proven and a radiographically suggested extent of her disease suggest that she would be best served by total mastectomy, sentinel node biopsy and with or without reconstruction  Rheumatoid arthritis on methotrexate  Reynaud's disease  Family history breast cancer in mother  Non-insulin-dependent diabetes mellitus  Extensive keloid formation  History of vaginal hysterectomy with retained ovaries   Plan: Patient is to go ahead and schedule surgery. She'll be scheduled for Port-A-Cath insertion, right total mastectomy, right axillary sentinal node biopsy, Possible right axillary lymph node dissection. To be followed by immediate implant based reconstruction by Dr. Marcelline Deist model Discontinue Remicade one to 2 weeks preoperative if okay with Dr. Trudie Reed. Otherwise we will simply go ahead.    Edsel Petrin. Dalbert Batman, M.D., [facility] General and Minimally invasive Surgery Breast and Colorectal Surgery Office:   [phone] Pager:   [phone]

## 2013-11-15 ENCOUNTER — Telehealth: Payer: Self-pay | Admitting: *Deleted

## 2013-11-15 NOTE — Telephone Encounter (Signed)
Message left by pt stating she is scheduled for MD follow up 3/3 " but that is when I will be having my surgery "  Pt is inquiring " when does Dr Jana Hakim need to see me ?"  Return call number given as [phone].  This RN will inquire with MD if he needs to see her before or post surgery for further discussion of plan of care.

## 2013-11-16 ENCOUNTER — Telehealth: Payer: Self-pay | Admitting: Oncology

## 2013-11-16 ENCOUNTER — Other Ambulatory Visit: Payer: Self-pay | Admitting: Oncology

## 2013-11-16 NOTE — Telephone Encounter (Signed)
, °

## 2013-11-16 NOTE — Telephone Encounter (Signed)
Pt called to cancel her appts stating that she will be in the hospital on 12/13/13 having breast surgery. i emailed GCM to make him aware...td

## 2013-11-18 ENCOUNTER — Telehealth (INDEPENDENT_AMBULATORY_CARE_PROVIDER_SITE_OTHER): Payer: Self-pay

## 2013-11-18 NOTE — Telephone Encounter (Signed)
Called patient to make her aware that Dr. Dalbert Batman is willing to do a prescription but will give it to her during her 1st PO appt.  Patient states understanding and agreeable at this time.

## 2013-11-18 NOTE — Telephone Encounter (Signed)
Patient calling into office requesting a prescription for Prosthesis Bra to bring the day of her surgery.  Patient  Has been to [facility] for the Prosthetic and was told she need a prescription.  Patient would like to have this as soon as she can.  Patient is scheduled for surgery on 12/13/13 for Right Total Mastectomy w/rt SLN  & PAC Insertion.  Please call when prescription if ready for pick up.

## 2013-12-01 ENCOUNTER — Telehealth: Payer: Self-pay | Admitting: *Deleted

## 2013-12-01 NOTE — Telephone Encounter (Signed)
sw pt gv appt for echo on 12/20/13@ 11am and for 12/26/13 @ 11:40am to see Bensimhon. Pt is aware...td

## 2013-12-07 ENCOUNTER — Encounter (HOSPITAL_BASED_OUTPATIENT_CLINIC_OR_DEPARTMENT_OTHER): Payer: Self-pay | Admitting: *Deleted

## 2013-12-07 NOTE — Progress Notes (Signed)
To come in for [facility] labs and cxr Bring all meds and overnight bag

## 2013-12-09 ENCOUNTER — Other Ambulatory Visit (INDEPENDENT_AMBULATORY_CARE_PROVIDER_SITE_OTHER): Payer: Self-pay | Admitting: General Surgery

## 2013-12-09 ENCOUNTER — Encounter (HOSPITAL_BASED_OUTPATIENT_CLINIC_OR_DEPARTMENT_OTHER)
Admission: RE | Admit: 2013-12-09 | Discharge: 2013-12-09 | Disposition: A | Discharge status: Home / Self Care | Payer: Medicare Other | Source: Ambulatory Visit | Attending: General Surgery | Admitting: General Surgery

## 2013-12-09 ENCOUNTER — Ambulatory Visit
Admission: RE | Admit: 2013-12-09 | Discharge: 2013-12-09 | Disposition: A | Discharge status: Home / Self Care | Payer: Medicare Other | Source: Ambulatory Visit | Attending: General Surgery | Admitting: General Surgery

## 2013-12-09 DIAGNOSIS — Z01812 Encounter for preprocedural laboratory examination: Secondary | ICD-10-CM | POA: Insufficient documentation

## 2013-12-09 LAB — URINALYSIS, ROUTINE W REFLEX MICROSCOPIC
BILIRUBIN URINE: NEGATIVE
Glucose, UA: NEGATIVE mg/dL
Ketones, ur: NEGATIVE mg/dL
Nitrite: NEGATIVE
Protein, ur: NEGATIVE mg/dL
Specific Gravity, Urine: 1.017 (ref 1.005–1.030)
UROBILINOGEN UA: 0.2 mg/dL (ref 0.0–1.0)
pH: 5 (ref 5.0–8.0)

## 2013-12-09 LAB — CBC WITH DIFFERENTIAL/PLATELET
BASOS ABS: 0 10*3/uL (ref 0.0–0.1)
Basophils Relative: 0 % (ref 0–1)
Eosinophils Absolute: 0.1 10*3/uL (ref 0.0–0.7)
Eosinophils Relative: 1 % (ref 0–5)
HEMATOCRIT: 41.5 % (ref 36.0–46.0)
HEMOGLOBIN: 14 g/dL (ref 12.0–15.0)
LYMPHS PCT: 34 % (ref 12–46)
Lymphs Abs: 3.4 10*3/uL (ref 0.7–4.0)
MCH: 27.8 pg (ref 26.0–34.0)
MCHC: 33.7 g/dL (ref 30.0–36.0)
MCV: 82.5 fL (ref 78.0–100.0)
MONO ABS: 0.9 10*3/uL (ref 0.1–1.0)
MONOS PCT: 9 % (ref 3–12)
Neutro Abs: 5.8 10*3/uL (ref 1.7–7.7)
Neutrophils Relative %: 56 % (ref 43–77)
Platelets: 162 10*3/uL (ref 150–400)
RBC: 5.03 MIL/uL (ref 3.87–5.11)
RDW: 13.7 % (ref 11.5–15.5)
WBC: 10.2 10*3/uL (ref 4.0–10.5)

## 2013-12-09 LAB — COMPREHENSIVE METABOLIC PANEL
ALT: 30 U/L (ref 0–35)
AST: 36 U/L (ref 0–37)
Albumin: 3.7 g/dL (ref 3.5–5.2)
Alkaline Phosphatase: 102 U/L (ref 39–117)
BILIRUBIN TOTAL: 0.2 mg/dL — AB (ref 0.3–1.2)
BUN: 8 mg/dL (ref 6–23)
CHLORIDE: 104 meq/L (ref 96–112)
CO2: 24 mEq/L (ref 19–32)
CREATININE: 0.91 mg/dL (ref 0.50–1.10)
Calcium: 9.4 mg/dL (ref 8.4–10.5)
GFR calc Af Amer: 78 mL/min — ABNORMAL LOW (ref >90)
GFR, EST NON AFRICAN AMERICAN: 67 mL/min — AB (ref >90)
Glucose, Bld: 126 mg/dL — ABNORMAL HIGH (ref 70–99)
Potassium: 4.3 mEq/L (ref 3.7–5.3)
Sodium: 140 mEq/L (ref 137–147)
Total Protein: 7.8 g/dL (ref 6.0–8.3)

## 2013-12-09 LAB — URINE MICROSCOPIC-ADD ON

## 2013-12-12 ENCOUNTER — Encounter (HOSPITAL_BASED_OUTPATIENT_CLINIC_OR_DEPARTMENT_OTHER): Payer: Self-pay | Admitting: *Deleted

## 2013-12-12 NOTE — H&P (Signed)
Angela Gross   MRN:  [id number]   Description: 63 year old female  Provider: Adin Hector, MD  Department: [facility]          Diagnoses      Breast cancer of lower-inner quadrant of right female breast    -  Primary      174.3               Current Vitals - Last Recorded      BP Pulse Temp(Src) Resp Ht Wt      116/78 100 97.7 F (36.5 C) (Temporal) 15 5' (1.524 m) 157 lb 9.6 oz (71.487 kg)      BMI 30.78 kg/m2                                 History and Physical   Adin Hector, MD     Status: Signed            Patient ID: Angela Gross, female   DOB: 1951/01/05, 63 y.o.   MRN: [id number]   Angela Gross is a 63 y.o. female. She returns for further evaluation and management of invasive ductal carcinoma of the right breast. Dr. [name] is her rheumatologist. Baruch Goldmann, P.A. At [facility]ad is her PCP.        She has seen Dr.  Jana Hakim, who has advised her to have chemotherapy and a Port-A-Cath insertion. She is in agreement with that.            She has seen Dr. Celedonio Miyamoto, who plans immediate implant based reconstruction and excision of keloids.        The patient has no prior history of breast problems, and her last mammogram was 2010. She recently went for screening mammograms. The mammogram shows a 1.2 cm spiculated mass in the central right breast. There was other calcifications extending laterally spanning 1.6 cm. There were also some calcifications and extended inferiorly and medially and if all of these are malignant the transverse extent was said to be 6.5 cm.   An ultrasound revealed a 10 mm mass in the right breast at the 6:00 position. The axilla looked normal.   Image guided biopsy was performed of the right breast laterally and the right breast at 6:00 position. Both of these areas showed invasive ductal carcinoma. Both are strongly receptor positive and both are amplified for Her-2. Ki-67 17%.   Subsequent MR shows mass  and enhancement in the biopsy-proven area of malignancy in the anterior third at the 6:00 position and additionally in the slightly outer right breast. There is a large area of non-mass like enhancement in the right breast that is asymmetric compared to the left and suspicious for additional DCIS. I have discussed this with [name], and she states that if desired, a stereotactic biopsy of calcifications in the medial right breast could be performed to document multi-quadrant disease.   Patient is here today with her daughter.   Family history is positive for breast cancer in the mother diagnosed in her 57s. No other breast or ovarian cancer in the family         Patient's comorbidities include rheumatoid arthritis, Raynaud's phenomena, osteoarthritis, non-insulin-dependent diabetes mellitus. She's had a vaginal hysterectomy but ovaries were apparently retained. She is a significant keloid former and has multiple hypertrophic keloids   Social history reveals she is married has  one daughter denies tobacco or alcohol. 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         She is ready to go ahead and plan her surgery. We had a long discussion about Port-A-Cath insertion, a right total mastectomy, right axillary sentinel node biopsy, possible right axillary lymph node dissection. We discussed the indications, details, techniques, and numerous risks of all these procedures, including bleeding, infection, pneumothorax, skin necrosis, nerve damage, arm swelling, or numbness, catheter malfunction, and other unforeseen problems. She understands all these issues and all of her questions are answered. She agrees with this plan.    Past history, family history, social history, and review of systems are all documented on the chart, unchanged, and noncontributory except as described above.   Physical Exam: Constitutional: She is oriented to person, place, and time. She appears well-developed and  well-nourished. No distress.   HENT:   Head: Normocephalic and atraumatic.     Neck supple. No JVD present. No tracheal deviation present. No thyromegaly present.   Cardiovascular: Normal rate, regular rhythm, normal heart sounds and intact distal pulses.   No murmur heard.   Pulmonary/Chest: Effort normal and breath sounds normal. No respiratory distress. She has no wheezes. She has no rales. She exhibits no tenderness.  Breasts are large. 2 biopsy sites on the right. No significant hematoma or mass. No axillary adenopathy on either side.   Abdominal: Soft. Bowel sounds are normal. She exhibits no distension and no mass. There is no tenderness. There is no rebound and no guarding.  Very large hypertrophic keloid right lateral chest wall, right lower quadrant of abdomen, left infraclavicular area.   Neurological: She is alert and oriented to person, place, and time. She exhibits normal muscle tone. Coordination normal.   Skin: Skin is warm. No rash noted. She is not diaphoretic. No erythema. No pallor.  Psychiatric: She has a normal mood and affect. Her behavior is normal. Judgment and thought content normal.      Assessment : Multifocal and probably multicentric invasive ductal carcinoma right breast, receptor positive, HER-2/neu-positive. The biopsy-proven and a radiographically suggested extent of her disease suggest that she would be best served by total mastectomy, sentinel node biopsy and with or without reconstruction   Rheumatoid arthritis on methotrexate   Reynaud's disease   Family history breast cancer in mother   Non-insulin-dependent diabetes mellitus   Extensive keloid formation   History of vaginal hysterectomy with retained ovaries    Plan: Patient is to go ahead and schedule surgery. She'll be scheduled for Port-A-Cath insertion, right total mastectomy, right axillary sentinal node biopsy, Possible right axillary lymph node dissection. To be followed by immediate implant  based reconstruction by Dr. Leland Johns Discontinue Remicade one to 2 weeks preoperative if okay with Dr. Trudie Reed. Otherwise we will simply go ahead.       Edsel Petrin. Dalbert Batman, M.D., [facility] General and Minimally invasive Surgery Breast and Colorectal Surgery Office:   [phone] Pager:   [phone]

## 2013-12-12 NOTE — H&P (Signed)
  Subjective:   Patient ID: Angela Gross is a 63 y.o. female.   HPI   [name] that presented with mass on screening MMG on right. . There was other calcifications extending laterally spanning 1.6 cm. Additional calcifications extended inferiorly and medially measuring 6.5 cm. US revealed a 10 mm mass in the right breast at the 6:00 position. Bx of the right breast laterally and the right breast at 6:00 positions showed invasive ductal carcinoma, Er/PR+, Her 2 +.  MRI showed mass in the biopsy-proven area of malignancy and additional arge area of non-mass like enhancement in the right breast suspicious for additional DCIS. Pt has declined additional biopsies of this area and would like to proceed with mastectomy and SLN.   History significant for RA and Raynauds. Angela Gross is her rheumatologist. On MTX for this and has discussed going off for surgery. RA mostly in hands.  Wt up to 167 when on steroids, she has been off these for over one month.  Significant hx keloids, inc ears chest. States worse with staple use. Has had ears excised and did not recur. Had steroids to one abdominal scar and this resolved.   No recent HbA1c in chart.   Review of Systems  HENT: Positive for sinus pressure.  Eyes: Negative.  Respiratory: Negative.  Cardiovascular: Negative.  Endocrine: Negative.  Skin: Negative.  Allergic/Immunologic: Negative.  Hematological: Bruises/bleeds easily.   Objective:   Physical Exam    Cardiovascular: Normal rate.  Pulmonary/Chest: Effort normal.  Genitourinary: No breast discharge.  Breasts without palpable masses grade 2 ptosis  SN o nipple R 29 L 30 cm BW R 19 L 19 cm Nipple to IMF R 9 L 10 cm  Large keloids at noted under skin  Neurological: She is alert and oriented to person, place, and time.  Skin:  Large keloids of left breast in area of tattoo, R chest in IMF, abdominal wall from open CCY scar   Assessment:   R breast cancer  Rheumatoid  arthritis, Raynauds   Plan:   Discussed autologous vs implant based reconstruction, immediate vs delayed. We discussed staged or multiple procedures, drains, expansion over several weeks. Discussed risks implant rupture, rippling, contralateral procedure, use of acellular dermis. Should expect to have smaller than D cup for final reconstruction. We also discussed that pending pathology, if any XRT recommended or additional areas of invasive disease, we will need to regroup and discuss changes to reconstruction plan.   Also discussed her significant keloid hx and she understands all of reconstruction or contralateral procedure could be marred by this. We can plan excision of R chest keloid, possibly left breast as part of this procedure and plan steroids in early post op period.   At this time, pt would like to proceed with immediate implant based reconstruction. Risks including but not limited to bleeding, seroma, hematoma, wound healing problems, implant failure or extrusion, DVT/PE, cardiopulmonary complications, damage to deeper structures, need for additional surgery, keloid recurrence reviewed.

## 2013-12-13 ENCOUNTER — Encounter (HOSPITAL_COMMUNITY)
Admission: RE | Admit: 2013-12-13 | Discharge: 2013-12-13 | Disposition: A | Discharge status: Home / Self Care | Payer: Medicare Other | Source: Ambulatory Visit | Attending: General Surgery | Admitting: General Surgery

## 2013-12-13 ENCOUNTER — Encounter (HOSPITAL_BASED_OUTPATIENT_CLINIC_OR_DEPARTMENT_OTHER)
Admission: RE | Disposition: A | Discharge status: Home / Self Care | Payer: Medicare Other | Source: Ambulatory Visit | Attending: General Surgery

## 2013-12-13 ENCOUNTER — Ambulatory Visit (HOSPITAL_COMMUNITY): Payer: Medicare Other

## 2013-12-13 ENCOUNTER — Encounter (HOSPITAL_BASED_OUTPATIENT_CLINIC_OR_DEPARTMENT_OTHER): Payer: Medicare Other | Admitting: Anesthesiology

## 2013-12-13 ENCOUNTER — Encounter (HOSPITAL_BASED_OUTPATIENT_CLINIC_OR_DEPARTMENT_OTHER): Payer: Self-pay | Admitting: *Deleted

## 2013-12-13 ENCOUNTER — Ambulatory Visit (HOSPITAL_BASED_OUTPATIENT_CLINIC_OR_DEPARTMENT_OTHER): Payer: Medicare Other | Admitting: Anesthesiology

## 2013-12-13 ENCOUNTER — Other Ambulatory Visit: Payer: No Typology Code available for payment source

## 2013-12-13 ENCOUNTER — Ambulatory Visit: Payer: No Typology Code available for payment source | Admitting: Oncology

## 2013-12-13 ENCOUNTER — Ambulatory Visit (HOSPITAL_BASED_OUTPATIENT_CLINIC_OR_DEPARTMENT_OTHER)
Admission: RE | Admit: 2013-12-13 | Discharge: 2013-12-14 | Disposition: A | Discharge status: Home / Self Care | Payer: Medicare Other | Source: Ambulatory Visit | Attending: General Surgery | Admitting: General Surgery

## 2013-12-13 DIAGNOSIS — I7300 Raynaud's syndrome without gangrene: Secondary | ICD-10-CM | POA: Insufficient documentation

## 2013-12-13 DIAGNOSIS — E119 Type 2 diabetes mellitus without complications: Secondary | ICD-10-CM | POA: Diagnosis not present

## 2013-12-13 DIAGNOSIS — M069 Rheumatoid arthritis, unspecified: Secondary | ICD-10-CM | POA: Diagnosis not present

## 2013-12-13 DIAGNOSIS — C50919 Malignant neoplasm of unspecified site of unspecified female breast: Secondary | ICD-10-CM

## 2013-12-13 DIAGNOSIS — Z452 Encounter for adjustment and management of vascular access device: Secondary | ICD-10-CM | POA: Insufficient documentation

## 2013-12-13 DIAGNOSIS — C50311 Malignant neoplasm of lower-inner quadrant of right female breast: Secondary | ICD-10-CM

## 2013-12-13 DIAGNOSIS — D0590 Unspecified type of carcinoma in situ of unspecified breast: Secondary | ICD-10-CM | POA: Diagnosis present

## 2013-12-13 DIAGNOSIS — L910 Hypertrophic scar: Secondary | ICD-10-CM | POA: Diagnosis not present

## 2013-12-13 DIAGNOSIS — C50911 Malignant neoplasm of unspecified site of right female breast: Secondary | ICD-10-CM | POA: Diagnosis present

## 2013-12-13 DIAGNOSIS — M1990 Unspecified osteoarthritis, unspecified site: Secondary | ICD-10-CM | POA: Diagnosis not present

## 2013-12-13 HISTORY — DX: Nausea with vomiting, unspecified: R11.2

## 2013-12-13 HISTORY — PX: LIPOMA EXCISION: SHX5283

## 2013-12-13 HISTORY — DX: Complete loss of teeth, unspecified cause, unspecified class: Z97.2

## 2013-12-13 HISTORY — DX: Presence of spectacles and contact lenses: Z97.3

## 2013-12-13 HISTORY — DX: Complete loss of teeth, unspecified cause, unspecified class: K08.109

## 2013-12-13 HISTORY — PX: MASTECTOMY W/ SENTINEL NODE BIOPSY: SHX2001

## 2013-12-13 HISTORY — PX: PORTACATH PLACEMENT: SHX2246

## 2013-12-13 HISTORY — DX: Nausea with vomiting, unspecified: Z98.890

## 2013-12-13 HISTORY — PX: BREAST RECONSTRUCTION WITH PLACEMENT OF TISSUE EXPANDER AND FLEX HD (ACELLULAR HYDRATED DERMIS): SHX6295

## 2013-12-13 LAB — HEMOGLOBIN A1C
HEMOGLOBIN A1C: 6 % — AB (ref <5.7)
Mean Plasma Glucose: 126 mg/dL — ABNORMAL HIGH (ref <117)

## 2013-12-13 LAB — GLUCOSE, CAPILLARY
GLUCOSE-CAPILLARY: 147 mg/dL — AB (ref 70–99)
GLUCOSE-CAPILLARY: 171 mg/dL — AB (ref 70–99)
Glucose-Capillary: 200 mg/dL — ABNORMAL HIGH (ref 70–99)
Glucose-Capillary: 152 mg/dL — ABNORMAL HIGH (ref 70–99)

## 2013-12-13 SURGERY — MASTECTOMY WITH SENTINEL LYMPH NODE BIOPSY
Anesthesia: General | Site: Chest | Laterality: Right

## 2013-12-13 MED ORDER — EPHEDRINE SULFATE 50 MG/ML IJ SOLN
INTRAMUSCULAR | Status: DC | PRN
  Administered 2013-12-13: 10 mg via INTRAVENOUS
  Administered 2013-12-13: 15 mg via INTRAVENOUS

## 2013-12-13 MED ORDER — INSULIN ASPART 100 UNIT/ML ~~LOC~~ SOLN
0.0000 [IU] | Freq: Three times a day (TID) | SUBCUTANEOUS | Status: DC
  Administered 2013-12-13: 4 [IU] via SUBCUTANEOUS

## 2013-12-13 MED ORDER — SODIUM CHLORIDE 0.9 % IJ SOLN
INTRAMUSCULAR | Status: AC
  Filled 2013-12-13: qty 10

## 2013-12-13 MED ORDER — HEPARIN SOD (PORK) LOCK FLUSH 100 UNIT/ML IV SOLN
INTRAVENOUS | Status: DC | PRN
  Administered 2013-12-13: 400 [IU] via INTRAVENOUS

## 2013-12-13 MED ORDER — ACETAMINOPHEN 500 MG PO TABS
1000.0000 mg | ORAL_TABLET | Freq: Three times a day (TID) | ORAL | Status: DC
  Administered 2013-12-13 – 2013-12-14 (×2): 1000 mg via ORAL
  Filled 2013-12-13 (×2): qty 2

## 2013-12-13 MED ORDER — SODIUM CHLORIDE 0.45 % IV SOLN
INTRAVENOUS | Status: DC
  Administered 2013-12-13: 75 mL/h via INTRAVENOUS

## 2013-12-13 MED ORDER — ACETAMINOPHEN 500 MG PO TABS
1000.0000 mg | ORAL_TABLET | Freq: Four times a day (QID) | ORAL | Status: DC
  Administered 2013-12-13: 1000 mg via ORAL
  Filled 2013-12-13: qty 2

## 2013-12-13 MED ORDER — METHYLENE BLUE 1 % INJ SOLN
INTRAMUSCULAR | Status: AC
  Filled 2013-12-13: qty 10

## 2013-12-13 MED ORDER — ONDANSETRON HCL 4 MG/2ML IJ SOLN: 4.0000 mg | Freq: Once | INTRAMUSCULAR | Status: DC | PRN

## 2013-12-13 MED ORDER — ONDANSETRON HCL 4 MG/2ML IJ SOLN
INTRAMUSCULAR | Status: DC | PRN
Start: 2013-12-13 — End: 2013-12-13
  Administered 2013-12-13 (×2): 4 mg via INTRAVENOUS

## 2013-12-13 MED ORDER — OXYCODONE HCL 5 MG PO TABS
5.0000 mg | ORAL_TABLET | ORAL | Status: DC | PRN
  Administered 2013-12-13 – 2013-12-14 (×4): 10 mg via ORAL
  Filled 2013-12-13 (×4): qty 2

## 2013-12-13 MED ORDER — OXYCODONE HCL 5 MG PO TABS: 5.0000 mg | ORAL_TABLET | Freq: Once | ORAL | Status: DC | PRN

## 2013-12-13 MED ORDER — SUCCINYLCHOLINE CHLORIDE 20 MG/ML IJ SOLN
INTRAMUSCULAR | Status: AC
Start: 2013-12-13 — End: 2013-12-13
  Filled 2013-12-13: qty 1

## 2013-12-13 MED ORDER — MIDAZOLAM HCL 2 MG/2ML IJ SOLN
INTRAMUSCULAR | Status: AC
  Filled 2013-12-13: qty 2

## 2013-12-13 MED ORDER — ONDANSETRON HCL 4 MG PO TABS: 4.0000 mg | ORAL_TABLET | Freq: Four times a day (QID) | ORAL | Status: DC | PRN

## 2013-12-13 MED ORDER — ONDANSETRON HCL 4 MG/2ML IJ SOLN
4.0000 mg | Freq: Once | INTRAMUSCULAR | Status: AC | PRN
  Administered 2013-12-13: 4 mg via INTRAVENOUS

## 2013-12-13 MED ORDER — OXYCODONE HCL 5 MG PO TABS: 5.0000 mg | ORAL_TABLET | ORAL | Status: DC | PRN

## 2013-12-13 MED ORDER — BUPIVACAINE-EPINEPHRINE 0.25% -1:200000 IJ SOLN
INTRAMUSCULAR | Status: DC | PRN
  Administered 2013-12-13: 18 mL

## 2013-12-13 MED ORDER — CHLORHEXIDINE GLUCONATE 4 % EX LIQD: 1.0000 "application " | Freq: Once | CUTANEOUS | Status: DC

## 2013-12-13 MED ORDER — CEFAZOLIN SODIUM 1-5 GM-% IV SOLN
INTRAVENOUS | Status: AC
  Filled 2013-12-13: qty 100

## 2013-12-13 MED ORDER — HYDROMORPHONE HCL PF 1 MG/ML IJ SOLN
INTRAMUSCULAR | Status: AC
  Filled 2013-12-13: qty 1

## 2013-12-13 MED ORDER — LIDOCAINE HCL (CARDIAC) 20 MG/ML IV SOLN
INTRAVENOUS | Status: DC | PRN
  Administered 2013-12-13: 100 mg via INTRAVENOUS

## 2013-12-13 MED ORDER — BUPIVACAINE-EPINEPHRINE PF 0.25-1:200000 % IJ SOLN
INTRAMUSCULAR | Status: AC
  Filled 2013-12-13: qty 30

## 2013-12-13 MED ORDER — CYCLOBENZAPRINE HCL 10 MG PO TABS: 10.0000 mg | ORAL_TABLET | Freq: Three times a day (TID) | ORAL | Status: DC | PRN

## 2013-12-13 MED ORDER — HYDROMORPHONE HCL PF 1 MG/ML IJ SOLN
0.5000 mg | INTRAMUSCULAR | Status: DC | PRN
  Administered 2013-12-13 (×2): 1 mg via INTRAVENOUS
  Filled 2013-12-13 (×2): qty 1

## 2013-12-13 MED ORDER — CEFAZOLIN SODIUM-DEXTROSE 2-3 GM-% IV SOLR
2.0000 g | INTRAVENOUS | Status: AC
  Administered 2013-12-13 (×2): 2 g via INTRAVENOUS

## 2013-12-13 MED ORDER — HYDROMORPHONE HCL PF 1 MG/ML IJ SOLN
0.2500 mg | INTRAMUSCULAR | Status: DC | PRN
Start: 2013-12-13 — End: 2013-12-13
  Administered 2013-12-13 (×2): 0.5 mg via INTRAVENOUS

## 2013-12-13 MED ORDER — 0.9 % SODIUM CHLORIDE (POUR BTL) OPTIME
TOPICAL | Status: DC | PRN
  Administered 2013-12-13: 1000 mL

## 2013-12-13 MED ORDER — PROPOFOL 10 MG/ML IV BOLUS
INTRAVENOUS | Status: DC | PRN
  Administered 2013-12-13: 1 mg via INTRAVENOUS

## 2013-12-13 MED ORDER — TRAMADOL HCL 50 MG PO TABS: 50.0000 mg | ORAL_TABLET | Freq: Four times a day (QID) | ORAL | Status: DC | PRN

## 2013-12-13 MED ORDER — ONDANSETRON HCL 4 MG/2ML IJ SOLN
INTRAMUSCULAR | Status: AC
  Filled 2013-12-13: qty 2

## 2013-12-13 MED ORDER — CEFAZOLIN SODIUM 1-5 GM-% IV SOLN
1.0000 g | Freq: Three times a day (TID) | INTRAVENOUS | Status: DC
  Administered 2013-12-13 – 2013-12-14 (×2): 1 g via INTRAVENOUS

## 2013-12-13 MED ORDER — PROPOFOL 10 MG/ML IV EMUL
INTRAVENOUS | Status: AC
  Filled 2013-12-13: qty 50

## 2013-12-13 MED ORDER — FENTANYL CITRATE 0.05 MG/ML IJ SOLN
50.0000 ug | INTRAMUSCULAR | Status: DC | PRN
  Administered 2013-12-13: 100 ug via INTRAVENOUS

## 2013-12-13 MED ORDER — LIDOCAINE-EPINEPHRINE 1 %-1:100000 IJ SOLN
INTRAMUSCULAR | Status: AC
  Filled 2013-12-13: qty 1

## 2013-12-13 MED ORDER — SODIUM CHLORIDE 0.9 % IR SOLN
Status: DC | PRN
  Administered 2013-12-13: 12:00:00

## 2013-12-13 MED ORDER — MIDAZOLAM HCL 2 MG/2ML IJ SOLN
1.0000 mg | INTRAMUSCULAR | Status: DC | PRN
  Administered 2013-12-13: 2 mg via INTRAVENOUS

## 2013-12-13 MED ORDER — TECHNETIUM TC 99M SULFUR COLLOID FILTERED
1.0000 | Freq: Once | INTRAVENOUS | Status: AC | PRN
  Administered 2013-12-13: 1 via INTRADERMAL

## 2013-12-13 MED ORDER — BUPIVACAINE-EPINEPHRINE PF 0.5-1:200000 % IJ SOLN
INTRAMUSCULAR | Status: AC
  Filled 2013-12-13: qty 30

## 2013-12-13 MED ORDER — SODIUM CHLORIDE 0.9 % IJ SOLN
INTRAMUSCULAR | Status: DC | PRN
  Administered 2013-12-13: 09:00:00

## 2013-12-13 MED ORDER — OXYCODONE HCL 5 MG/5ML PO SOLN: 5.0000 mg | Freq: Once | ORAL | Status: DC | PRN

## 2013-12-13 MED ORDER — SULFAMETHOXAZOLE-TMP DS 800-160 MG PO TABS
1.0000 | ORAL_TABLET | Freq: Two times a day (BID) | ORAL | Status: DC
Start: 2013-12-13 — End: 2013-12-26

## 2013-12-13 MED ORDER — PHENYLEPHRINE HCL 10 MG/ML IJ SOLN
INTRAMUSCULAR | Status: DC | PRN
  Administered 2013-12-13 (×2): 40 ug via INTRAVENOUS

## 2013-12-13 MED ORDER — DEXAMETHASONE SODIUM PHOSPHATE 4 MG/ML IJ SOLN
INTRAMUSCULAR | Status: DC | PRN
  Administered 2013-12-13: 4 mg via INTRAVENOUS

## 2013-12-13 MED ORDER — CEFAZOLIN SODIUM-DEXTROSE 2-3 GM-% IV SOLR
INTRAVENOUS | Status: AC
  Filled 2013-12-13: qty 50

## 2013-12-13 MED ORDER — SUCCINYLCHOLINE CHLORIDE 20 MG/ML IJ SOLN
INTRAMUSCULAR | Status: DC | PRN
  Administered 2013-12-13: 100 mg via INTRAVENOUS

## 2013-12-13 MED ORDER — HEPARIN SODIUM (PORCINE) 5000 UNIT/ML IJ SOLN
5000.0000 [IU] | Freq: Two times a day (BID) | INTRAMUSCULAR | Status: DC
  Administered 2013-12-13 (×2): 5000 [IU] via SUBCUTANEOUS

## 2013-12-13 MED ORDER — LACTATED RINGERS IV SOLN
INTRAVENOUS | Status: DC
  Administered 2013-12-13 (×2): via INTRAVENOUS

## 2013-12-13 MED ORDER — FENTANYL CITRATE 0.05 MG/ML IJ SOLN
INTRAMUSCULAR | Status: AC
  Filled 2013-12-13: qty 2

## 2013-12-13 MED ORDER — MORPHINE SULFATE 10 MG/ML IJ SOLN
INTRAMUSCULAR | Status: AC
  Filled 2013-12-13: qty 1

## 2013-12-13 MED ORDER — HEPARIN SOD (PORK) LOCK FLUSH 100 UNIT/ML IV SOLN
INTRAVENOUS | Status: AC
  Filled 2013-12-13: qty 5

## 2013-12-13 MED ORDER — BUPIVACAINE-EPINEPHRINE 0.5% -1:200000 IJ SOLN
INTRAMUSCULAR | Status: DC | PRN
  Administered 2013-12-13: 10 mL

## 2013-12-13 MED ORDER — HEPARIN (PORCINE) IN NACL 2-0.9 UNIT/ML-% IJ SOLN
INTRAMUSCULAR | Status: DC | PRN
  Administered 2013-12-13: 500 mL via INTRAVENOUS

## 2013-12-13 MED ORDER — MIDAZOLAM HCL 5 MG/5ML IJ SOLN
INTRAMUSCULAR | Status: DC | PRN
  Administered 2013-12-13: 2 mg via INTRAVENOUS

## 2013-12-13 MED ORDER — HEPARIN SODIUM (PORCINE) 5000 UNIT/ML IJ SOLN
INTRAMUSCULAR | Status: AC
  Filled 2013-12-13: qty 1

## 2013-12-13 MED ORDER — MEPERIDINE HCL 25 MG/ML IJ SOLN: 6.2500 mg | INTRAMUSCULAR | Status: DC | PRN

## 2013-12-13 MED ORDER — POTASSIUM CHLORIDE IN NACL 20-0.45 MEQ/L-% IV SOLN: INTRAVENOUS | Status: DC

## 2013-12-13 MED ORDER — SUFENTANIL CITRATE 50 MCG/ML IV SOLN
INTRAVENOUS | Status: DC | PRN
  Administered 2013-12-13: 30 ug via INTRAVENOUS
  Administered 2013-12-13 (×3): 10 ug via INTRAVENOUS

## 2013-12-13 MED ORDER — DEXTROSE 5 % IV SOLN
10.0000 mg | INTRAVENOUS | Status: DC | PRN
  Administered 2013-12-13: 40 ug/min via INTRAVENOUS

## 2013-12-13 MED ORDER — HEPARIN (PORCINE) IN NACL 2-0.9 UNIT/ML-% IJ SOLN
INTRAMUSCULAR | Status: AC
  Filled 2013-12-13: qty 500

## 2013-12-13 MED ORDER — ONDANSETRON HCL 4 MG/2ML IJ SOLN: 4.0000 mg | Freq: Four times a day (QID) | INTRAMUSCULAR | Status: DC | PRN

## 2013-12-13 MED ORDER — SUFENTANIL CITRATE 50 MCG/ML IV SOLN
INTRAVENOUS | Status: AC
  Filled 2013-12-13: qty 2

## 2013-12-13 SURGICAL SUPPLY — 142 items
APPLIER CLIP 11 MED OPEN (CLIP) ×5
BAG DECANTER FOR FLEXI CONT (MISCELLANEOUS) ×10 IMPLANT
BANDAGE ELASTIC 6 VELCRO ST LF (GAUZE/BANDAGES/DRESSINGS) IMPLANT
BENZOIN TINCTURE PRP APPL 2/3 (GAUZE/BANDAGES/DRESSINGS) IMPLANT
BINDER BREAST LRG (GAUZE/BANDAGES/DRESSINGS) IMPLANT
BINDER BREAST MEDIUM (GAUZE/BANDAGES/DRESSINGS) IMPLANT
BINDER BREAST XLRG (GAUZE/BANDAGES/DRESSINGS) IMPLANT
BINDER BREAST XXLRG (GAUZE/BANDAGES/DRESSINGS) ×5 IMPLANT
BIOPATCH RED 1 DISK 7.0 (GAUZE/BANDAGES/DRESSINGS) IMPLANT
BIOPATCH RED 1IN DISK 7.0MM (GAUZE/BANDAGES/DRESSINGS)
BLADE HEX COATED 2.75 (ELECTRODE) ×5 IMPLANT
BLADE SURG 10 STRL SS (BLADE) ×5 IMPLANT
BLADE SURG 15 STRL LF DISP TIS (BLADE) ×6 IMPLANT
BLADE SURG 15 STRL SS (BLADE) ×4
BLADE SURG ROTATE 9660 (MISCELLANEOUS) IMPLANT
BNDG CONFORM 2 STRL LF (GAUZE/BANDAGES/DRESSINGS) IMPLANT
BNDG ELASTIC 2 VLCR STRL LF (GAUZE/BANDAGES/DRESSINGS) IMPLANT
BNDG GAUZE ELAST 4 BULKY (GAUZE/BANDAGES/DRESSINGS) IMPLANT
CANISTER SUCT 1200ML W/VALVE (MISCELLANEOUS) ×10 IMPLANT
CATH ROBINSON RED A/P 14FR (CATHETERS) ×5 IMPLANT
CHLORAPREP W/TINT 26ML (MISCELLANEOUS) ×15 IMPLANT
CLEANER CAUTERY TIP 5X5 PAD (MISCELLANEOUS) IMPLANT
CLIP APPLIE 11 MED OPEN (CLIP) ×3 IMPLANT
CLOSURE WOUND 1/2 X4 (GAUZE/BANDAGES/DRESSINGS)
CORDS BIPOLAR (ELECTRODE) IMPLANT
COVER MAYO STAND STRL (DRAPES) ×5 IMPLANT
COVER PROBE 5X48 (MISCELLANEOUS) ×2
COVER PROBE W GEL 5X96 (DRAPES) IMPLANT
COVER TABLE BACK 60X90 (DRAPES) ×5 IMPLANT
DECANTER SPIKE VIAL GLASS SM (MISCELLANEOUS) IMPLANT
DERMABOND ADVANCED (GAUZE/BANDAGES/DRESSINGS) ×10
DERMABOND ADVANCED .7 DNX12 (GAUZE/BANDAGES/DRESSINGS) ×15 IMPLANT
DRAIN CHANNEL 19F RND (DRAIN) ×5 IMPLANT
DRAIN JP 10F RND SILICONE (MISCELLANEOUS) IMPLANT
DRAPE C-ARM 42X72 X-RAY (DRAPES) ×5 IMPLANT
DRAPE LAPAROSCOPIC ABDOMINAL (DRAPES) ×10 IMPLANT
DRAPE PED LAPAROTOMY (DRAPES) IMPLANT
DRAPE U-SHAPE 76X120 STRL (DRAPES) IMPLANT
DRAPE UTILITY XL STRL (DRAPES) ×15 IMPLANT
DRSG EMULSION OIL 3X3 NADH (GAUZE/BANDAGES/DRESSINGS) IMPLANT
DRSG TEGADERM 2-3/8X2-3/4 SM (GAUZE/BANDAGES/DRESSINGS) IMPLANT
DRSG TEGADERM 4X10 (GAUZE/BANDAGES/DRESSINGS) IMPLANT
DRSG TEGADERM 4X4.75 (GAUZE/BANDAGES/DRESSINGS) IMPLANT
ELECT BLADE 4.0 EZ CLEAN MEGAD (MISCELLANEOUS) ×10
ELECT COATED BLADE 2.86 ST (ELECTRODE) IMPLANT
ELECT NEEDLE BLADE 2-5/6 (NEEDLE) IMPLANT
ELECT REM PT RETURN 9FT ADLT (ELECTROSURGICAL) ×5
ELECT REM PT RETURN 9FT PED (ELECTROSURGICAL)
ELECTRODE BLDE 4.0 EZ CLN MEGD (MISCELLANEOUS) ×6 IMPLANT
ELECTRODE REM PT RETRN 9FT PED (ELECTROSURGICAL) IMPLANT
ELECTRODE REM PT RTRN 9FT ADLT (ELECTROSURGICAL) ×3 IMPLANT
EVACUATOR SILICONE 100CC (DRAIN) ×5 IMPLANT
GAUZE XEROFORM 1X8 LF (GAUZE/BANDAGES/DRESSINGS) IMPLANT
GLOVE BIO SURGEON STRL SZ 6 (GLOVE) ×10 IMPLANT
GLOVE BIO SURGEON STRL SZ 6.5 (GLOVE) IMPLANT
GLOVE BIO SURGEONS STRL SZ 6.5 (GLOVE)
GLOVE BIOGEL PI IND STRL 7.0 (GLOVE) ×6 IMPLANT
GLOVE BIOGEL PI IND STRL 8 (GLOVE) ×3 IMPLANT
GLOVE BIOGEL PI INDICATOR 7.0 (GLOVE) ×4
GLOVE BIOGEL PI INDICATOR 8 (GLOVE) ×2
GLOVE ECLIPSE 6.5 STRL STRAW (GLOVE) ×10 IMPLANT
GLOVE EUDERMIC 7 POWDERFREE (GLOVE) ×10 IMPLANT
GLOVE SURG SS PI 8.0 STRL IVOR (GLOVE) ×5 IMPLANT
GOWN STRL REUS W/ TWL LRG LVL3 (GOWN DISPOSABLE) ×9 IMPLANT
GOWN STRL REUS W/ TWL XL LVL3 (GOWN DISPOSABLE) ×6 IMPLANT
GOWN STRL REUS W/TWL LRG LVL3 (GOWN DISPOSABLE) ×6
GOWN STRL REUS W/TWL XL LVL3 (GOWN DISPOSABLE) ×4
GRAFT FLEX HD 4X16 THICK (Tissue Mesh) ×5 IMPLANT
IMPL BREAST TIS EXP M 450CC (Breast) ×3 IMPLANT
IMPLANT BREAST TIS EXP M 450CC (Breast) ×5 IMPLANT
IV CATH PLACEMENT UNIT 16 GA (IV SOLUTION) IMPLANT
IV KIT MINILOC 20X1 SAFETY (NEEDLE) IMPLANT
IV NS 1000ML (IV SOLUTION)
IV NS 1000ML BAXH (IV SOLUTION) IMPLANT
IV NS 500ML (IV SOLUTION) ×2
IV NS 500ML BAXH (IV SOLUTION) ×3 IMPLANT
KIT BARDPORT ISP (Port) IMPLANT
KIT CVR 48X5XPRB PLUP LF (MISCELLANEOUS) ×3 IMPLANT
KIT FILL SYSTEM UNIVERSAL (SET/KITS/TRAYS/PACK) ×5 IMPLANT
KIT PORT POWER 8FR ISP CVUE (Catheter) ×5 IMPLANT
KIT PORT POWER ISP 8FR (Catheter) IMPLANT
NDL SAFETY ECLIPSE 18X1.5 (NEEDLE) ×3 IMPLANT
NEEDLE 27GAX1X1/2 (NEEDLE) IMPLANT
NEEDLE BLUNT 17GA (NEEDLE) IMPLANT
NEEDLE HYPO 18GX1.5 SHARP (NEEDLE) ×2
NEEDLE HYPO 22GX1.5 SAFETY (NEEDLE) IMPLANT
NEEDLE HYPO 25X1 1.5 SAFETY (NEEDLE) ×10 IMPLANT
NS IRRIG 1000ML POUR BTL (IV SOLUTION) ×10 IMPLANT
PACK BASIN DAY SURGERY FS (CUSTOM PROCEDURE TRAY) ×5 IMPLANT
PAD ABD 8X10 STRL (GAUZE/BANDAGES/DRESSINGS) ×10 IMPLANT
PAD ALCOHOL SWAB (MISCELLANEOUS) ×10 IMPLANT
PAD CLEANER CAUTERY TIP 5X5 (MISCELLANEOUS)
PENCIL BUTTON HOLSTER BLD 10FT (ELECTRODE) ×5 IMPLANT
PIN SAFETY STERILE (MISCELLANEOUS) ×5 IMPLANT
RUBBERBAND STERILE (MISCELLANEOUS) IMPLANT
SET ASEPTIC TRANSFER (MISCELLANEOUS) ×10 IMPLANT
SET SHEATH INTRODUCER 10FR (MISCELLANEOUS) IMPLANT
SHEATH COOK PEEL AWAY SET 9F (SHEATH) IMPLANT
SHEET MEDIUM DRAPE 40X70 STRL (DRAPES) ×5 IMPLANT
SLEEVE SCD COMPRESS KNEE MED (MISCELLANEOUS) ×5 IMPLANT
SPONGE GAUZE 2X2 8PLY STER LF (GAUZE/BANDAGES/DRESSINGS)
SPONGE GAUZE 2X2 8PLY STRL LF (GAUZE/BANDAGES/DRESSINGS) IMPLANT
SPONGE GAUZE 4X4 12PLY (GAUZE/BANDAGES/DRESSINGS) IMPLANT
SPONGE GAUZE 4X4 12PLY STER LF (GAUZE/BANDAGES/DRESSINGS) IMPLANT
SPONGE LAP 18X18 X RAY DECT (DISPOSABLE) ×15 IMPLANT
SPONGE LAP 4X18 X RAY DECT (DISPOSABLE) ×5 IMPLANT
STAPLER VISISTAT 35W (STAPLE) IMPLANT
STRIP CLOSURE SKIN 1/2X4 (GAUZE/BANDAGES/DRESSINGS) IMPLANT
SUCTION FRAZIER TIP 10 FR DISP (SUCTIONS) IMPLANT
SUT CHROMIC 4 0 P 3 18 (SUTURE) IMPLANT
SUT ETHILON 2 0 FS 18 (SUTURE) ×5 IMPLANT
SUT ETHILON 3 0 PS 1 (SUTURE) IMPLANT
SUT ETHILON 4 0 PS 2 18 (SUTURE) IMPLANT
SUT ETHILON 5 0 P 3 18 (SUTURE)
SUT MNCRL 6-0 UNDY P1 1X18 (SUTURE) IMPLANT
SUT MNCRL AB 4-0 PS2 18 (SUTURE) ×15 IMPLANT
SUT MON AB 5-0 P3 18 (SUTURE) IMPLANT
SUT MON AB 5-0 PS2 18 (SUTURE) IMPLANT
SUT MONOCRYL 6-0 P1 1X18 (SUTURE)
SUT NYLON ETHILON 5-0 P-3 1X18 (SUTURE) IMPLANT
SUT PLAIN 5 0 P 3 18 (SUTURE) IMPLANT
SUT PROLENE 2 0 CT2 30 (SUTURE) ×10 IMPLANT
SUT SILK 2 0 FS (SUTURE) ×5 IMPLANT
SUT VIC AB 3-0 SH 27 (SUTURE)
SUT VIC AB 3-0 SH 27X BRD (SUTURE) IMPLANT
SUT VIC AB 5-0 P-3 18X BRD (SUTURE) IMPLANT
SUT VIC AB 5-0 P3 18 (SUTURE)
SUT VICRYL 3-0 CR8 SH (SUTURE) ×10 IMPLANT
SUT VICRYL 4-0 PS2 18IN ABS (SUTURE) ×15 IMPLANT
SYR 50ML LL SCALE MARK (SYRINGE) ×5 IMPLANT
SYR 5ML LUER SLIP (SYRINGE) ×5 IMPLANT
SYR BULB 3OZ (MISCELLANEOUS) IMPLANT
SYR BULB IRRIGATION 50ML (SYRINGE) ×5 IMPLANT
SYR CONTROL 10ML LL (SYRINGE) ×10 IMPLANT
TAPE CLOTH SURG 4X10 WHT LF (GAUZE/BANDAGES/DRESSINGS) IMPLANT
TOWEL OR 17X24 6PK STRL BLUE (TOWEL DISPOSABLE) ×10 IMPLANT
TOWEL OR NON WOVEN STRL DISP B (DISPOSABLE) IMPLANT
TRAY DSU PREP LF (CUSTOM PROCEDURE TRAY) ×5 IMPLANT
TUBE CONNECTING 20'X1/4 (TUBING) ×2
TUBE CONNECTING 20X1/4 (TUBING) ×8 IMPLANT
UNDERPAD 30X30 INCONTINENT (UNDERPADS AND DIAPERS) ×10 IMPLANT
YANKAUER SUCT BULB TIP NO VENT (SUCTIONS) ×5 IMPLANT

## 2013-12-13 NOTE — Interval H&P Note (Signed)
History and Physical Interval Note:  12/13/2013 6:59 AM  Angela Gross  has presented today for surgery, with the diagnosis of right breast cancer,KELOIDS OF CHEST OF LEFT BREAST  The various methods of treatment have been discussed with the patient and family. After consideration of risks, benefits and other options for treatment, the patient has consented to  Procedure(s): RIGHT TOTAL MASTECTOMY WITH SENTINEL LYMPH NODE BIOPSY (Right) INSERTION PORT-A-CATH (N/A) RIGHT BREAST RECONSTRUCTION WITH PLACEMENT OF TISSUE EXPANDER AND FLEX HD (ACELLULAR HYDRATED DERMIS) (Right) EXCISION OF LEFT BREAST KELOID AND RIGHT CHEST KELOID (Left) as a surgical intervention .  The patient's history has been reviewed, patient examined, no change in status, stable for surgery.  I have reviewed the patient's chart and labs.  Questions were answered to the patient's satisfaction.     Jabriel Vanduyne

## 2013-12-13 NOTE — Progress Notes (Signed)
CBG 171. Call in to MD per orders. Awaiting response.

## 2013-12-13 NOTE — Transfer of Care (Signed)
Immediate Anesthesia Transfer of Care Note  Patient: Angela Gross  Procedure(s) Performed: Procedure(s): RIGHT TOTAL MASTECTOMY WITH SENTINEL LYMPH NODE BIOPSY (Right) INSERTION PORT-A-CATH (Right) RIGHT BREAST RECONSTRUCTION WITH PLACEMENT OF TISSUE EXPANDER AND FLEX HD (ACELLULAR HYDRATED DERMIS) (Right) EXCISION OF LEFT BREAST KELOID AND RIGHT CHEST KELOID (Bilateral)  Patient Location: PACU  Anesthesia Type:General  Level of Consciousness: awake, alert  and oriented  Airway & Oxygen Therapy: Patient Spontanous Breathing and Patient connected to face mask oxygen  Post-op Assessment: Report given to PACU RN and Post -op Vital signs reviewed and stable  Post vital signs: Reviewed and stable  Complications: No apparent anesthesia complications

## 2013-12-13 NOTE — Interval H&P Note (Signed)
History and Physical Interval Note:  12/13/2013 7:13 AM  Angela Gross  has presented today for surgery, with the diagnosis of right breast cancer,KELOIDS OF CHEST OF LEFT BREAST  The goals and the various methods of treatment have been discussed with the patient and family. After consideration of risks, benefits and other options for treatment, the patient has consented to  Procedure(s): RIGHT TOTAL MASTECTOMY WITH SENTINEL LYMPH NODE BIOPSY (Right) INSERTION PORT-A-CATH (N/A) RIGHT BREAST RECONSTRUCTION WITH PLACEMENT OF TISSUE EXPANDER AND FLEX HD (ACELLULAR HYDRATED DERMIS) (Right) EXCISION OF LEFT BREAST KELOID AND RIGHT CHEST KELOID (Left) as a surgical intervention .  The patient's history has been reviewed, patient examined, no change in status, stable for surgery.  I have reviewed the patient's chart and labs.  Questions were answered to the patient's satisfaction.     Adin Hector

## 2013-12-13 NOTE — Op Note (Signed)
Operative Note   DATE OF OPERATION: 12/13/2013  LOCATION: [place]- observation  SURGICAL DIVISION: Plastic Surgery  PREOPERATIVE DIAGNOSES:  1.Right breast cancer 2. Keloid of left chest and right trunk  POSTOPERATIVE DIAGNOSES:  same  PROCEDURE:  1. Complex repair of left chest and right trunk totaling 18 cm. 2. Right breast reconstruction with tissue expander 3. Acellular dermis for breast reconstruction 60 cm2  SURGEON: Irene Limbo MD MBA  ASSISTANT: none  ANESTHESIA:  General.   EBL: 75 ml  COMPLICATIONS: None.   INDICATIONS FOR PROCEDURE:  The patient, Angela Gross, is a 63 y.o. female born on 01-27-1951, is here for immediate breast reconstruction. We will also addresss severe keloids over left anterior chest and right lateral chest.   FINDINGS: [place] style 9200 tissue expander 450 ml  Ref [id number] SN [id number] Initial fill volume 210 ml.   DESCRIPTION OF PROCEDURE:  The patient's operative sites were marked with the patient in the preoperative area. The patient was taken to the operating room. SCDs were placed and IV antibiotics were given. Following completion of port placement the drapes were removed and the patient's operative site was prepped and draped in a sterile fashion. A time out was performed and all information was confirmed to be correct.  Following completion of mastectomy, sharp excision of left anterior chest and right lateral chest keloids was completed sharply to normal appearing subcutaneous tissue. Limited subcutaneous dissection was completed to aid with tension free closure. Closure was completed in both areas in a layered fascion with 4-0 vicryl in dermis and running 4-0 monocryl subcuticular skin closure. We the directed attention to right chest. The inferior insertions of pectoralis major muscle were divided with cautery and submuscular dissection completed. Flex HD acellular dermis was fenestrated and sewn to inferior border of pectoralis major  muscle with 3-0 vicryl.The cavity was irrigated with solution containing bacitracin and inspected for hemostasis. 26 Fr JP placed in subcutaneous position and secured with 2-0 nylon. The tissue expander was prepared and inserted into submuscular space and secured to chest wall with 3-0 vicryl. The caudal edge of acelluar dermis was then sewn to Scarpas fascia along inframammary fold. The lateral border was left free. Skin closure was completed with running 3-0 vicryl in superficial fascia and 4-0 vicryl in dermis. Skin closure completed with 4-0 monocryl subcuticular and Dermabond applied to all incisions. Dry dressing and breast binder applied.  The patient was allowed to wake from anesthesia, extubated and taken to the recovery room in satisfactory condition.   SPECIMENS: none  DRAINS: 19 Fr JP in Right subcutaneous chest  Irene Limbo, MD [address] [facility] Plastic & Reconstructive Surgery [phone]

## 2013-12-13 NOTE — Anesthesia Procedure Notes (Signed)
Procedure Name: Intubation Date/Time: 12/13/2013 7:44 AM Performed by: Melynda Ripple D Pre-anesthesia Checklist: Patient identified, Emergency Drugs available, Suction available and Patient being monitored Patient Re-evaluated:Patient Re-evaluated prior to inductionOxygen Delivery Method: Circle System Utilized Preoxygenation: Pre-oxygenation with 100% oxygen Intubation Type: IV induction Ventilation: Mask ventilation without difficulty Laryngoscope Size: Mac and 3 Grade View: Grade I Tube type: Oral Number of attempts: 1 Airway Equipment and Method: stylet and oral airway Placement Confirmation: ETT inserted through vocal cords under direct vision,  positive ETCO2 and breath sounds checked- equal and bilateral Secured at: 2 cm Tube secured with: Tape Dental Injury: Teeth and Oropharynx as per pre-operative assessment

## 2013-12-13 NOTE — Progress Notes (Signed)
Emotional support during breast injections °

## 2013-12-13 NOTE — Anesthesia Preprocedure Evaluation (Signed)
Anesthesia Evaluation  Patient identified by MRN, date of birth, ID band Patient awake    Reviewed: Allergy & Precautions, H&P , NPO status , Patient's Chart, lab work & pertinent test results  History of Anesthesia Complications (+) PONV  Airway Mallampati: I TM Distance: >3 FB Neck ROM: Full    Dental   Pulmonary          Cardiovascular hypertension, Pt. on medications     Neuro/Psych    GI/Hepatic   Endo/Other  diabetes, Well Controlled, Type 2, Oral Hypoglycemic Agents  Renal/GU      Musculoskeletal   Abdominal   Peds  Hematology   Anesthesia Other Findings   Reproductive/Obstetrics                           Anesthesia Physical Anesthesia Plan  ASA: II  Anesthesia Plan: General   Post-op Pain Management:    Induction: Intravenous  Airway Management Planned: Oral ETT  Additional Equipment:   Intra-op Plan:   Post-operative Plan: Extubation in OR  Informed Consent: I have reviewed the patients History and Physical, chart, labs and discussed the procedure including the risks, benefits and alternatives for the proposed anesthesia with the patient or authorized representative who has indicated his/her understanding and acceptance.     Plan Discussed with: CRNA and Surgeon  Anesthesia Plan Comments:         Anesthesia Quick Evaluation

## 2013-12-13 NOTE — Op Note (Signed)
Patient Name:           Angela Gross   Date of Surgery:        12/13/2013  Pre op Diagnosis:      Multifocal and probably multicentric invasive ductal carcinoma right breast, receptor positive, HER-2/neu-positive.    Post op Diagnosis:    Same  Procedure:                 Insertion of power port ClearView 8 French totaled venous vascular access device, Use of fluoroscopy for guidance and positioning of catheter Inject blue dye right breast Right total mastectomy Right axillary sentinel node biopsy  Surgeon:                     Edsel Petrin. Dalbert Batman, M.D., FACS  Assistant:                      Lenis Dickinson, M.D.  Operative Indications:   She recently went for screening mammograms. The mammogram shows a 1.2 cm spiculated mass in the central right breast. There were other calcifications extending laterally spanning 1.6 cm. There were also some calcifications and extended inferiorly and medially and if all of these are malignant the transverse extent was said to be 6.5 cm.  An ultrasound revealed a 10 mm mass in the right breast at the 6:00 position. The axilla looked normal.  Image guided biopsy was performed of the right breast laterally and the right breast at 6:00 position. Both of these areas showed invasive ductal carcinoma. Both are strongly receptor positive and both are amplified for Her-2. Ki-67 17%.  Subsequent MR shows mass and enhancement in the biopsy-proven area of malignancy in the anterior third at the 6:00 position and additionally in the slightly outer right breast. There is a large area of non-mass like enhancement in the right breast that is asymmetric compared to the left and suspicious for additional DCIS.  The patient was not that should any further breast biopsies and requested that we proceed with mastectomy. She was actually an immediate reconstruction.Family history is positive for for breast cancer in the mother and. She has seen Dr. Jana Hakim, who has advised her to  have chemotherapy and a Port-A-Cath insertion. She is in agreement with that.  She has seen Dr. Celedonio Miyamoto, who plans immediate implant based reconstruction and excision of keloids. Comorbidities include methotrexate dependent Rheumatoid arthritis, Raynaud's, extensive keloid formation, osteoarthritis, non-insulin-dependent diabetes mellitus.    Operative Findings:       The port was placed in the right subclavian vein and the catheter flushed well and had excellent blood return. C-arm imaging showed good positioning without any deformity of the catheter. We avoided the left side because there was a huge keloid in the left infraclavicular area. The right mastectomy specimen was quite large but we removed very little skin. We found 2 sentinel lymph nodes. Touch prep cytology was negative for cancer and so no further axillary dissection  was done. Dr. Iran Planas  plans implant based reconstruction of the right breast and resection of some of her large keloids.  Procedure in Detail:          Nuclear medicine injection of the right breast was performed in the holding area by the nuclear medicine technician.     The patient was taken to operating room. General endotracheal anesthesia was utilized. The patient was positioned with her arms at her sides a roll behind her back. The neck  and chest were prepped and draped in sterile fashion. Intravenous antibiotics were given. Surgical time out was performed. A right subclavian venipuncture was performed and a guidewire inserted into the superior vena cava under fluoroscopic guidance. Using the C-arm I marked a template on the chest wall to guide placement of the catheter. The intent is to place a catheter in the superior vena cava just above the right atrium. A small incision was made at the wire insertion site. I made a transverse incision about 2 cm below the right clavicle. A pocket was created at the level of the pectoralis fascia. Using a tunneling device  to pass the catheter from the wire insertion site to the port pocket site. Then, utilizing the template marked on  the chest wall, measured the catheter and cut it at 21 cm. The catheter was then secured to the port with  the locking device and  the port and catheter flushed with heparinized saline. The port was placed in the skin pocket. The dilator and peel-away sheath were inserted over the guidewire into the central venous circulation. The wire and dilator removed, the catheter threaded into the peel-away sheath the peel-away sheath removed. We had  excellent blood return and the catheter flushed easily. Fluoroscopy was performed and showed that the catheter was positioned well in the superior vena cava and there was no deformity of the catheter anywhere along its course. The port and catheter were then flushed with concentrated heparin. The port was sutured to the pectoralis fascia with 3 interrupted sutures of 2-0 Prolene. The subcutaneous  tissue was closed with 3-0 Vicryl sutures and the skin closed with running subcuticular sutures of 4-0 Monocryl and Dermabond.  We then removed all the drapes and  brought the arms out to the sides. The neck and chest and upper abdomen and lateral chest wall and axilla were prepped and draped in sterile fashion. Another time out was performed. We planned a skin sparing transverse elliptical incision encompassing the nipple and areola complex. That incision was made. Skin flaps were raised superiorly to the infraclavicular area being careful to avoid injuring the port, medially to the parasternal area, inferiorly to the anterior rectus sheath and laterally to the latissimus dorsi muscle. I then continued my dissection up in the axilla. Using the neoprobe I dissected out 2 sentinel lymph nodes that were very hot and somewhat blue. These 2 sentinel lymph nodes were all that I found. Intraoperative cytology was performed by Dr. Tammi Klippel in the pathology lab and she called and  stated that both nodes were negative for cancer. We felt that no further axilla dissection was warrented. The lateral skin margin of the breast was marked with a silk suture. The right breast was then dissected off of the pectoralis major and minor muscles with electrocautery. Dissection was carried laterally down the chest wall and up to the tail of Spence and the specimen was removed and sent to the lab. Hemostasis was excellent and achieved with electrocautery and a few metal clips. The wound was irrigated.     At this point in the case we lost about 100 cc of blood. Sponge needle and instrument counts were correct. At this point the case control of the operation was turned over to Dr. Leland Johns who will dictate her portion of the procedure separately.     Edsel Petrin. Dalbert Batman, M.D., FACS General and Minimally Invasive Surgery Breast and Colorectal Surgery  12/13/2013 10:18 AM

## 2013-12-13 NOTE — Anesthesia Postprocedure Evaluation (Signed)
Anesthesia Post Note  Patient: Angela Gross  Procedure(s) Performed: Procedure(s) (LRB): RIGHT TOTAL MASTECTOMY WITH SENTINEL LYMPH NODE BIOPSY (Right) INSERTION PORT-A-CATH (Right) RIGHT BREAST RECONSTRUCTION WITH PLACEMENT OF TISSUE EXPANDER AND FLEX HD (ACELLULAR HYDRATED DERMIS) (Right) EXCISION OF LEFT BREAST KELOID AND RIGHT CHEST KELOID (Bilateral)  Anesthesia type: general  Patient location: PACU  Post pain: Pain level controlled  Post assessment: Patient's Cardiovascular Status Stable  Last Vitals:  Filed Vitals:   12/13/13 1350  BP: 120/80  Pulse: 119  Temp: 36.1 C  Resp: 16    Post vital signs: Reviewed and stable  Level of consciousness: sedated  Complications: No apparent anesthesia complications

## 2013-12-13 NOTE — Progress Notes (Signed)
Spoke to Dr. Iran Planas. Notified her of CBG of 171 and of Tylenol order being above maximum recommenced daily dose. MD to order SSI and change Tylenol order.

## 2013-12-14 ENCOUNTER — Encounter (HOSPITAL_BASED_OUTPATIENT_CLINIC_OR_DEPARTMENT_OTHER): Payer: Self-pay | Admitting: General Surgery

## 2013-12-14 DIAGNOSIS — D0590 Unspecified type of carcinoma in situ of unspecified breast: Secondary | ICD-10-CM | POA: Diagnosis not present

## 2013-12-14 LAB — GLUCOSE, CAPILLARY: Glucose-Capillary: 113 mg/dL — ABNORMAL HIGH (ref 70–99)

## 2013-12-14 NOTE — Addendum Note (Signed)
Addendum created 12/14/13 2025 by Willa Frater, CRNA   Modules edited: Charges VN

## 2013-12-14 NOTE — Progress Notes (Signed)
1 Day Post-Op  Subjective: She had a goodnight. Sore earlier. Pain under good control female. Denies any arm swelling or numbness. Tolerating diet. Alert.  She states she'll be ready to go home today.  Drain functioning normally. Moderate output.  Postop chest x-ray looks good. Lungs expanded. Port well-positioned.  CBG down to 152. Acceptable.  Objective: Vital signs in last 24 hours: Temp:  [97 F (36.1 C)-98.6 F (37 C)] 97.8 F (36.6 C) (03/04 0315) Pulse Rate:  [83-119] 101 (03/04 0315) Resp:  [11-26] 18 (03/04 0315) BP: (97-123)/(55-82) 105/58 mmHg (03/04 0315) SpO2:  [95 %-100 %] 97 % (03/04 0315) Weight:  [154 lb 8 oz (70.081 kg)] 154 lb 8 oz (70.081 kg) (03/03 0615)    Intake/Output from previous day: 03/03 0701 - 03/04 0700 In: 4912 [P.O.:2112; I.V.:2800] Out: 740 [Urine:300; Drains:215; Blood:225] Intake/Output this shift: Total I/O In: 1350 [P.O.:1350] Out: 135 [Drains:135]  General appearance: overt. Cooperative. Oriented. Mental status normal. In no distress. Breasts, right mastectomy skin flaps looked good. Viable and warm. JP draining serosanguineous. Port site looks good. No hematoma.  Lab Results:  Results for orders placed during the hospital encounter of 12/13/13 (from the past 24 hour(s))  HEMOGLOBIN A1C     Status: Abnormal   Collection Time    12/13/13  7:00 AM      Result Value Ref Range   Hemoglobin A1C 6.0 (*) <5.7 %   Mean Plasma Glucose 126 (*) <117 mg/dL  GLUCOSE, CAPILLARY     Status: Abnormal   Collection Time    12/13/13 12:24 PM      Result Value Ref Range   Glucose-Capillary 147 (*) 70 - 99 mg/dL  GLUCOSE, CAPILLARY     Status: Abnormal   Collection Time    12/13/13  3:11 PM      Result Value Ref Range   Glucose-Capillary 171 (*) 70 - 99 mg/dL  GLUCOSE, CAPILLARY     Status: Abnormal   Collection Time    12/13/13  5:19 PM      Result Value Ref Range   Glucose-Capillary 200 (*) 70 - 99 mg/dL  GLUCOSE, CAPILLARY     Status:  Abnormal   Collection Time    12/13/13 10:32 PM      Result Value Ref Range   Glucose-Capillary 152 (*) 70 - 99 mg/dL     Studies/Results: @RISRSLT24 @  . acetaminophen  1,000 mg Oral 3 times per day  .  ceFAZolin (ANCEF) IV  1 g Intravenous 3 times per day  . heparin  5,000 Units Subcutaneous BID  . insulin aspart  0-20 Units Subcutaneous TID WC     Assessment/Plan: s/p Procedure(s): RIGHT TOTAL MASTECTOMY WITH SENTINEL LYMPH NODE BIOPSY INSERTION PORT-A-CATH RIGHT BREAST RECONSTRUCTION WITH PLACEMENT OF TISSUE EXPANDER AND FLEX HD (ACELLULAR HYDRATED DERMIS) EXCISION OF LEFT BREAST KELOID AND RIGHT CHEST KELOID   POD #1. Doing well. Should be ready to discharge home today, if Dr. Leland Johns agrees.  I'll call pathology report to her tomorrow or the next day, once it is available.  she has an appointment to see Dr. Jana Hakim next week. See me in 3 weeks.  Defer all wound care, drain care, prescription for analgesics to Dr. Leland Johns.  @PROBHOSP @  LOS: 1 day    Angela Gross M 12/14/2013  . .prob

## 2013-12-14 NOTE — Discharge Instructions (Signed)
Diabetes and Exercise Exercising regularly is important. It is not just about losing weight. It has many health benefits, such as:  Improving your overall fitness, flexibility, and endurance.  Increasing your bone density.  Helping with weight control.  Decreasing your body fat.  Increasing your muscle strength.  Reducing stress and tension.  Improving your overall health. People with diabetes who exercise gain additional benefits because exercise:  Reduces appetite.  Improves the body's use of blood sugar (glucose).  Helps lower or control blood glucose.  Decreases blood pressure.  Helps control blood lipids (such as cholesterol and triglycerides).  Improves the body's use of the hormone insulin by:  Increasing the body's insulin sensitivity.  Reducing the body's insulin needs.  Decreases the risk for heart disease because exercising:  Lowers cholesterol and triglycerides levels.  Increases the levels of good cholesterol (such as high-density lipoproteins [HDL]) in the body.  Lowers blood glucose levels. YOUR ACTIVITY PLAN  Choose an activity that you enjoy and set realistic goals. Your health care provider or diabetes educator can help you make an activity plan that works for you. You can break activities into 2 or 3 sessions throughout the day. Doing so is as good as one long session. Exercise ideas include:  Taking the dog for a walk.  Taking the stairs instead of the elevator.  Dancing to your favorite song.  Doing your favorite exercise with a friend. RECOMMENDATIONS FOR EXERCISING WITH TYPE 1 OR TYPE 2 DIABETES   Check your blood glucose before exercising. If blood glucose levels are greater than 240 mg/dL, check for urine ketones. Do not exercise if ketones are present.  Avoid injecting insulin into areas of the body that are going to be exercised. For example, avoid injecting insulin into:  The arms when playing tennis.  The legs when  jogging.  Keep a record of:  Food intake before and after you exercise.  Expected peak times of insulin action.  Blood glucose levels before and after you exercise.  The type and amount of exercise you have done.  Review your records with your health care provider. Your health care provider will help you to develop guidelines for adjusting food intake and insulin amounts before and after exercising.  If you take insulin or oral hypoglycemic agents, watch for signs and symptoms of hypoglycemia. They include:  Dizziness.  Shaking.  Sweating.  Chills.  Confusion.  Drink plenty of water while you exercise to prevent dehydration or heat stroke. Body water is lost during exercise and must be replaced.  Talk to your health care provider before starting an exercise program to make sure it is safe for you. Remember, almost any type of activity is better than none. Document Released: 12/20/2003 Document Revised: 06/01/2013 Document Reviewed: 03/08/2013 [facility] Patient Information 2014 [place]. Diabetes Meal Planning Guide The diabetes meal planning guide is a tool to help you plan your meals and snacks. It is important for people with diabetes to manage their blood glucose (sugar) levels. Choosing the right foods and the right amounts throughout your day will help control your blood glucose. Eating right can even help you improve your blood pressure and reach or maintain a healthy weight. CARBOHYDRATE COUNTING MADE EASY When you eat carbohydrates, they turn to sugar. This raises your blood glucose level. Counting carbohydrates can help you control this level so you feel better. When you plan your meals by counting carbohydrates, you can have more flexibility in what you eat and balance your medicine  with your food intake. Carbohydrate counting simply means adding up the total amount of carbohydrate grams in your meals and snacks. Try to eat about the same amount at each meal. Foods  with carbohydrates are listed below. Each portion below is 1 carbohydrate serving or 15 grams of carbohydrates. Ask your dietician how many grams of carbohydrates you should eat at each meal or snack. Grains and Starches  1 slice bread.   English muffin or hotdog/hamburger bun.   cup cold cereal (unsweetened).   cup cooked pasta or rice.   cup starchy vegetables (corn, potatoes, peas, beans, winter squash).  1 tortilla (6 inches).   bagel.  1 waffle or pancake (size of a CD).   cup cooked cereal.  4 to 6 small crackers. *Whole grain is recommended. Fruit  1 cup fresh unsweetened berries, melon, papaya, pineapple.  1 small fresh fruit.   banana or mango.   cup fruit juice (4 oz unsweetened).   cup canned fruit in natural juice or water.  2 tbs dried fruit.  12 to 15 grapes or cherries. Milk and Yogurt  1 cup fat-free or 1% milk.  1 cup soy milk.  6 oz light yogurt with sugar-free sweetener.  6 oz low-fat soy yogurt.  6 oz plain yogurt. Vegetables  1 cup raw or  cup cooked is counted as 0 carbohydrates or a "free" food.  If you eat 3 or more servings at 1 meal, count them as 1 carbohydrate serving. Other Carbohydrates   oz chips or pretzels.   cup ice cream or frozen yogurt.   cup sherbet or sorbet.  2 inch square cake, no frosting.  1 tbs honey, sugar, jam, jelly, or syrup.  2 small cookies.  3 squares of graham crackers.  3 cups popcorn.  6 crackers.  1 cup broth-based soup.  Count 1 cup casserole or other mixed foods as 2 carbohydrate servings.  Foods with less than 20 calories in a serving may be counted as 0 carbohydrates or a "free" food. You may want to purchase a book or computer software that lists the carbohydrate gram counts of different foods. In addition, the nutrition facts panel on the labels of the foods you eat are a good source of this information. The label will tell you how big the serving size is and the  total number of carbohydrate grams you will be eating per serving. Divide this number by 15 to obtain the number of carbohydrate servings in a portion. Remember, 1 carbohydrate serving equals 15 grams of carbohydrate. SERVING SIZES Measuring foods and serving sizes helps you make sure you are getting the right amount of food. The list below tells how big or small some common serving sizes are.  1 oz.........4 stacked dice.  3 oz........Marland KitchenDeck of cards.  1 tsp.......Marland KitchenTip of little finger.  1 tbs......Marland KitchenMarland KitchenThumb.  2 tbs.......Marland KitchenGolf ball.   cup......Marland KitchenHalf of a fist.  1 cup.......Marland KitchenA fist. SAMPLE DIABETES MEAL PLAN Below is a sample meal plan that includes foods from the grain and starches, dairy, vegetable, fruit, and meat groups. A dietician can individualize a meal plan to fit your calorie needs and tell you the number of servings needed from each food group. However, controlling the total amount of carbohydrates in your meal or snack is more important than making sure you include all of the food groups at every meal. You may interchange carbohydrate containing foods (dairy, starches, and fruits). The meal plan below is an example of a 2000 calorie diet  using carbohydrate counting. This meal plan has 17 carbohydrate servings. Breakfast  1 cup oatmeal (2 carb servings).   cup light yogurt (1 carb serving).  1 cup blueberries (1 carb serving).   cup almonds. Snack  1 large apple (2 carb servings).  1 low-fat string cheese stick. Lunch  Chicken breast salad.  1 cup spinach.   cup chopped tomatoes.  2 oz chicken breast, sliced.  2 tbs low-fat [place] dressing.  12 whole-wheat crackers (2 carb servings).  12 to 15 grapes (1 carb serving).  1 cup low-fat milk (1 carb serving). Snack  1 cup carrots.   cup hummus (1 carb serving). Dinner  3 oz broiled salmon.  1 cup brown rice (3 carb servings). Snack  1  cups steamed broccoli (1 carb serving) drizzled with 1  tsp olive oil and lemon juice.  1 cup light pudding (2 carb servings). DIABETES MEAL PLANNING WORKSHEET Your dietician can use this worksheet to help you decide how many servings of foods and what types of foods are right for you.  BREAKFAST Food Group and Servings / Carb Servings Grain/Starches __________________________________ Dairy __________________________________________ Vegetable ______________________________________ Fruit ___________________________________________ Meat __________________________________________ Fat ____________________________________________ LUNCH Food Group and Servings / Carb Servings Grain/Starches ___________________________________ Dairy ___________________________________________ Fruit ____________________________________________ Meat ___________________________________________ Fat _____________________________________________ Angela Gross Food Group and Servings / Carb Servings Grain/Starches ___________________________________ Dairy ___________________________________________ Fruit ____________________________________________ Meat ___________________________________________ Fat _____________________________________________ SNACKS Food Group and Servings / Carb Servings Grain/Starches ___________________________________ Dairy ___________________________________________ Vegetable _______________________________________ Fruit ____________________________________________ Meat ___________________________________________ Fat _____________________________________________ DAILY TOTALS Starches _________________________ Vegetable ________________________ Fruit ____________________________ Dairy ____________________________ Meat ____________________________ Fat ______________________________   About my Jackson-Pratt Bulb Drain  What is a Jackson-Pratt bulb? A Jackson-Pratt is a soft, round device used to collect drainage. It is connected to a long,  thin drainage catheter, which is held in place by one or two small stiches near your surgical incision site. When the bulb is squeezed, it forms a vacuum, forcing the drainage to empty into the bulb.  Emptying the Jackson-Pratt bulb- To empty the bulb: 1. Release the plug on the top of the bulb. 2. Pour the bulb's contents into a measuring container which your nurse will provide. 3. Record the time emptied and amount of drainage. Empty the drain(s) as often as your     doctor or nurse recommends.  Date                  Time                    Amount (Drain 1)                 Amount (Drain 2)  _____________________________________________________________________  _____________________________________________________________________  _____________________________________________________________________  _____________________________________________________________________  _____________________________________________________________________  _____________________________________________________________________  _____________________________________________________________________  _____________________________________________________________________  Squeezing the Jackson-Pratt Bulb- To squeeze the bulb: 1. Make sure the plug at the top of the bulb is open. 2. Squeeze the bulb tightly in your fist. You will hear air squeezing from the bulb. 3. Replace the plug while the bulb is squeezed. 4. Use a safety pin to attach the bulb to your clothing. This will keep the catheter from     pulling at the bulb insertion site.  When to call your doctor- Call your doctor if:  Drain site becomes red, swollen or hot.  You have a fever greater than 101 degrees F.  There is oozing at the drain site.  Drain falls out (apply a guaze bandage over the drain hole and secure it  with tape).  Drainage increases daily not related to activity patterns. (You will usually have more drainage when you  are active than when you are resting.)  Drainage has a bad odor.   .     Be sure to drink lots of fluids, water and juice. Follow a low fat, high fiber diet. Loss of fruits and vegetables Walk as much as possible Keep her appointment with Dr. Jana Hakim next week

## 2013-12-16 NOTE — Progress Notes (Signed)
Pt notified of attached path note from Dr Dalbert Batman. Pt has po appt next week. Pt also has upcoming appt with Dr Jana Hakim. Pt will call with any concerns.

## 2013-12-16 NOTE — Progress Notes (Signed)
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 The margins are negative and that is good news. The 2 lymph nodes that we removed our also negative for cancer. That is excellent news. We will discuss this in detail at her first office visit.  hmi ______

## 2013-12-20 ENCOUNTER — Ambulatory Visit (HOSPITAL_COMMUNITY)
Admission: RE | Admit: 2013-12-20 | Discharge: 2013-12-20 | Disposition: A | Discharge status: Home / Self Care | Payer: Medicare Other | Source: Ambulatory Visit | Attending: Internal Medicine | Admitting: Internal Medicine

## 2013-12-20 DIAGNOSIS — C50319 Malignant neoplasm of lower-inner quadrant of unspecified female breast: Secondary | ICD-10-CM | POA: Insufficient documentation

## 2013-12-20 DIAGNOSIS — C50311 Malignant neoplasm of lower-inner quadrant of right female breast: Secondary | ICD-10-CM

## 2013-12-20 DIAGNOSIS — I519 Heart disease, unspecified: Secondary | ICD-10-CM

## 2013-12-20 NOTE — Progress Notes (Signed)
*  PRELIMINARY RESULTS* Echocardiogram 2D Echocardiogram has been performed.  Leavy Cella 12/20/2013, 11:54 AM

## 2013-12-22 ENCOUNTER — Ambulatory Visit (HOSPITAL_BASED_OUTPATIENT_CLINIC_OR_DEPARTMENT_OTHER): Payer: Medicare Other | Admitting: Oncology

## 2013-12-22 VITALS — BP 111/73 | HR 99 | Temp 98.0°F | Resp 18 | Ht 60.0 in | Wt 153.9 lb

## 2013-12-22 DIAGNOSIS — M2550 Pain in unspecified joint: Secondary | ICD-10-CM

## 2013-12-22 DIAGNOSIS — Z9010 Acquired absence of unspecified breast and nipple: Secondary | ICD-10-CM

## 2013-12-22 DIAGNOSIS — C50919 Malignant neoplasm of unspecified site of unspecified female breast: Secondary | ICD-10-CM

## 2013-12-22 DIAGNOSIS — C50311 Malignant neoplasm of lower-inner quadrant of right female breast: Secondary | ICD-10-CM

## 2013-12-22 DIAGNOSIS — C50911 Malignant neoplasm of unspecified site of right female breast: Secondary | ICD-10-CM

## 2013-12-22 DIAGNOSIS — M069 Rheumatoid arthritis, unspecified: Secondary | ICD-10-CM

## 2013-12-22 NOTE — Progress Notes (Signed)
[place]  [phone]) [phone] [phone]) [phone]     ID: [name] OB: Apr 04, 1951  MR#: [id number]  UTM#:[id number]  PCP: Namon Cirri GYN:   SU: Fanny Skates OTHER MD: [name], Arnoldo Hooker [place]  CHIEF COMPLAINT: "I just had a mammogram and it showed cancer".  HISTORY OF PRESENT ILLNESS: Angela Gross had routine screening mammography at the breast center 09/21/2013 showing a possible mass in calcifications in the right breast. On 10/11/2013 she underwent right diagnostic mammography and ultrasonography. This confirmed a spiculated 1.2 cm mass in the central right breast, with a group of pleomorphic calcifications slightly laterally. The calcifications spanning 1.6 cm. A second group of calcifications extended inferiorly and spanned 8 mm. The entire area in aggregate measured 6.5 cm. By physical exam there was no palpable abnormality. Ultrasonography showed an irregular hypoechoic mass at the 6:00 position of the right breast measuring 1 cm maximally. The right axilla was normal.  Biopsy of the right breast mass in question as well as the more lateral area of calcifications on 10/11/2013 showed ([id number]) most to be positive for an invasive ductal carcinoma, both estrogen receptor 85-90% positive with strong staining intensity, both progesterone receptor negative, with an MIB-1 of 17%. HER-2 was amplified, with a signals ratio of 3.44 and a copy number per cell 04 0.30.  Bilateral breast MRI 10/19/2013 showed again a spiculated mass in the 6:00 region of the right breast measuring 1.2 cm, a slightly more lateral hematoma associated with a second biopsy and also with clumped enhancement, and asymmetric none masslike enhancement in most of the right breast, the entire area of abnormality measuring 9.0 cm. The left breast was unremarkable and there were no abnormal appearing lymph nodes.  Her subsequent history is as detailed below    INTERVAL  HISTORY: Angela Gross returns today for followup of her breast cancer accompanied by her husband Angela Gross and daughter Angela Gross. Since her last visit here the patient underwent right simple mastectomy with sentinel lymph node sampling. The final pathology (SZ [id number]) showed a 1.8 cm invasive ductal carcinoma, grade 3, with ample margins, and and repeat HER-2 testing again positive, with a signals ratio of 5.32 and the number per cell 6.65. The second mass which had been noted to be lateral to the 6:00 mass associated with a biopsy clip showed only biopsy site response. The patient had a right tissue expander placed at the same time. She also had repair of a left chest wall keloid totaling 18 cm. She also had a port placed at the same time.  REVIEW OF SYSTEMS: Angela Gross tolerated the surgery moderately well, but she tells me she has not been able to keep "as quiet as the doctor wanted", so she has had some problems with wound healing on the right. She is now being very careful and keeps a pillow pressed against her right breast. She stopped the Remicade, prednisone, and methotrexate in anticipation of chemotherapy and to facilitate healing. She is beginning to have "flares" of her rheumatoid arthritis however. This is not associated with a rash or fever, but she is having more joint pain, she tells me. A detailed review of systems today was otherwise stable  PAST MEDICAL HISTORY: Past Medical History  Diagnosis Date  . Raynaud disease   . Osteoporosis   . Hypertension   . Arthritis     on Remicade  . PONV (postoperative nausea and vomiting)   . Wears glasses   . Full dentures   . Fibromyalgia   .  Diabetes mellitus without complication     has not taken meds in over a month    PAST SURGICAL HISTORY: Past Surgical History  Procedure Laterality Date  . Cholecystectomy    . Vaginal hysterectomy      no salpingo-oophoretomu  . Colonoscopy    . Mastectomy w/ sentinel node biopsy Right 12/13/2013     Procedure: RIGHT TOTAL MASTECTOMY WITH SENTINEL LYMPH NODE BIOPSY;  Surgeon: Adin Hector, MD;  Location: [facility];  Service: General;  Laterality: Right;  . Portacath placement Right 12/13/2013    Procedure: INSERTION PORT-A-CATH;  Surgeon: Adin Hector, MD;  Location: [facility];  Service: General;  Laterality: Right;  . Breast reconstruction with placement of tissue expander and flex hd (acellular hydrated dermis) Right 12/13/2013    Procedure: RIGHT BREAST RECONSTRUCTION WITH PLACEMENT OF TISSUE EXPANDER AND FLEX HD (ACELLULAR HYDRATED DERMIS);  Surgeon: Irene Limbo, MD;  Location: [facility];  Service: Plastics;  Laterality: Right;  . Lipoma excision Bilateral 12/13/2013    Procedure: EXCISION OF LEFT BREAST KELOID AND RIGHT CHEST KELOID;  Surgeon: Irene Limbo, MD;  Location: [facility];  Service: Plastics;  Laterality: Bilateral;    FAMILY HISTORY No family history on file. The patient is little information about her father. Her mother died from a heart attack at the age of 53. She had been diagnosed with breast cancer in her late 21s. The patient had 4 brothers and 4 sisters. There is no other history of breast or ovarian cancer in the family to her knowledge.  GYNECOLOGIC HISTORY:  She does not recall her age of menarche. She underwent hysterectomy in her 64s. She did not receive hormone replacement. First live birth at 76. She was GX P2.  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 She is currently disabled secondary to her rheumatoid arthritis. Her husband Kolette Vey is a retired Administrator. Son Angela Gross "Angela Maples" Gross works in [place] as a Patent attorney. Daughter Angela Gross is a Social worker.    ADVANCED DIRECTIVES: Not in place   HEALTH MAINTENANCE: History  Substance Use Topics  . Smoking status: Never Smoker   .  Smokeless tobacco: Not on file  . Alcohol Use: No     Colonoscopy:  PAP:  Bone density: At [facility] hospital 01/05/2009 showed osteopenia with a T score of -1.7  Lipid panel:  Allergies  Allergen Reactions  . Codeine Other (See Comments)     Altered mental status, Numbess    Current Outpatient Prescriptions  Medication Sig Dispense Refill  . cyclobenzaprine (FLEXERIL) 10 MG tablet Take 10 mg by mouth 3 (three) times daily as needed for muscle spasms.      . folic acid (FOLVITE) 1 MG tablet Take 1 mg by mouth daily.      . Liraglutide (VICTOZA [place]) Inject into the skin.      Marland Kitchen oxyCODONE (ROXICODONE) 5 MG immediate release tablet Take 1-2 tablets (5-10 mg total) by mouth every 4 (four) hours as needed for severe pain.  50 tablet  0  . sulfamethoxazole-trimethoprim (BACTRIM DS) 800-160 MG per tablet Take 1 tablet by mouth 2 (two) times daily.  14 tablet  0  . traMADol (ULTRAM) 50 MG tablet Take 50 mg by mouth 2 (two) times daily as needed.      . traMADol (ULTRAM) 50 MG tablet Take 1-2 tablets (50-100 mg  total) by mouth every 6 (six) hours as needed.  50 tablet  0  . Vitamin D, Ergocalciferol, (DRISDOL) 50000 UNITS CAPS capsule Take 50,000 Units by mouth every 7 (seven) days.       No current facility-administered medications for this visit.    OBJECTIVE: [name] holding a pillow to her right chest. Filed Vitals:   12/22/13 1657  BP: 111/73  Pulse: 99  Temp: 98 F (36.7 C)  Resp: 18     Body mass index is 30.06 kg/(m^2).    ECOG FS:2 - Symptomatic, <50% confined to bed  Exam was limited today because of the patient's recent surgery.  LAB RESULTS:  CMP     Component Value Date/Time   NA 140 12/09/2013 1400   NA 142 10/26/2013 1551   K 4.3 12/09/2013 1400   K 3.6 10/26/2013 1551   CL 104 12/09/2013 1400   CO2 24 12/09/2013 1400   CO2 27 10/26/2013 1551   GLUCOSE 126* 12/09/2013 1400   GLUCOSE 95 10/26/2013 1551   BUN 8 12/09/2013 1400   BUN 8.2  10/26/2013 1551   CREATININE 0.91 12/09/2013 1400   CREATININE 1.0 10/26/2013 1551   CALCIUM 9.4 12/09/2013 1400   CALCIUM 9.5 10/26/2013 1551   PROT 7.8 12/09/2013 1400   PROT 7.9 10/26/2013 1551   ALBUMIN 3.7 12/09/2013 1400   ALBUMIN 3.8 10/26/2013 1551   AST 36 12/09/2013 1400   AST 27 10/26/2013 1551   ALT 30 12/09/2013 1400   ALT 25 10/26/2013 1551   ALKPHOS 102 12/09/2013 1400   ALKPHOS 106 10/26/2013 1551   BILITOT 0.2* 12/09/2013 1400   BILITOT 0.43 10/26/2013 1551   GFRNONAA 67* 12/09/2013 1400   GFRAA 78* 12/09/2013 1400    I No results found for this basename: SPEP,  UPEP,   kappa and lambda light chains    Lab Results  Component Value Date   WBC 10.2 12/09/2013   NEUTROABS 5.8 12/09/2013   HGB 14.0 12/09/2013   HCT 41.5 12/09/2013   MCV 82.5 12/09/2013   PLT 162 12/09/2013      Chemistry      Component Value Date/Time   NA 140 12/09/2013 1400   NA 142 10/26/2013 1551   K 4.3 12/09/2013 1400   K 3.6 10/26/2013 1551   CL 104 12/09/2013 1400   CO2 24 12/09/2013 1400   CO2 27 10/26/2013 1551   BUN 8 12/09/2013 1400   BUN 8.2 10/26/2013 1551   CREATININE 0.91 12/09/2013 1400   CREATININE 1.0 10/26/2013 1551      Component Value Date/Time   CALCIUM 9.4 12/09/2013 1400   CALCIUM 9.5 10/26/2013 1551   ALKPHOS 102 12/09/2013 1400   ALKPHOS 106 10/26/2013 1551   AST 36 12/09/2013 1400   AST 27 10/26/2013 1551   ALT 30 12/09/2013 1400   ALT 25 10/26/2013 1551   BILITOT 0.2* 12/09/2013 1400   BILITOT 0.43 10/26/2013 1551       No results found for this basename: LABCA2    No components found with this basename: LABCA125    No results found for this basename: INR,  in the last 168 hours  Urinalysis    Component Value Date/Time   COLORURINE YELLOW 12/09/2013 1333   APPEARANCEUR TURBID* 12/09/2013 1333   LABSPEC 1.017 12/09/2013 [place] 5.0 12/09/2013 [place] 12/09/2013 [place]* 12/09/2013 [place] 12/09/2013 [place]  NEGATIVE  12/09/2013 1333   PROTEINUR NEGATIVE 12/09/2013 1333   UROBILINOGEN 0.2 12/09/2013 1333   NITRITE NEGATIVE 12/09/2013 1333   LEUKOCYTESUR LARGE* 12/09/2013 1333    STUDIES: ------------------------------------------------------------ Transthoracic Echocardiography  Patient: Nekeisha, Aure MR #: [id number] Study Date: 12/20/2013 Gender: F Age: 87 Height: 152.4cm Weight: 70kg BSA: 1.3m2 Pt. Status: Room:  ORDERING Anniemae Haberkorn, GHazle NordmannSONOGRAPHER ELeavy CellaPERFORMING [facility], Outpatient cc:  ------------------------------------------------------------ LV EF: 55% - 60%  ------------------------------------------------------------ History: PMH: Breast Cancer Magninant Neoplasm 174.9 Risk factors: Diabetes mellitus.    Chest 2 View  12/09/2013   CLINICAL DATA:  Preoperative evaluation.  Right breast carcinoma.  EXAM: CHEST  2 VIEW  COMPARISON:  DG CHEST 2V dated 10/29/2011; CT CHEST W/CM dated 11/07/2011  FINDINGS: Normal cardiac and mediastinal contours. No consolidative pulmonary opacities. No pleural effusion or pneumothorax. Regional skeleton is unremarkable. Cholecystectomy clips.  IMPRESSION: No acute cardiopulmonary process.   Electronically Signed   By: DLovey NewcomerM.D.   On: 12/09/2013 14:55   Nm Sentinel Node Inj-no Rpt (breast)  12/13/2013   CLINICAL DATA: Cancer right breast   Sulfur colloid was injected intradermally by the nuclear medicine  technologist for breast cancer sentinel node localization.    Dg Chest Port 1 View  12/13/2013   CLINICAL DATA:  Port-A-Cath placement.  EXAM: PORTABLE CHEST - 1 VIEW  COMPARISON:  12/09/2013  FINDINGS: There has been placement of a right subclavian Port-A-Cath which has tip at the cavoatrial junction. No definite right pneumothorax. There is a small caliber catheter projected over the right chest with tip just right of midline at the level of the hilum which may be related to patient's recent right breast surgery. Lungs are  somewhat hypoinflated with minimal prominence of the perihilar markings suggesting minimal vascular congestion. There is subtle opacification over the lateral left base likely atelectasis. Cardiomediastinal silhouette is within normal. There is a small right cervical rib. There are surgical clips over the right axilla. Remainder of the exam is unchanged.  IMPRESSION: Minimal prominence of the perihilar markings suggesting minimal vascular congestion. Possible minimal left basilar atelectasis.  Right subclavian Port-A-Cath with tip at the region of the cavoatrial junction. No definite pneumothorax.  Small caliber catheter over the right chest with tip just right of midline as this may represent a surgical drain related to patient's recent breast surgery.   Electronically Signed   By: DMarin OlpM.D.   On: 12/13/2013 13:27   Dg Fluoro Guide Cv Line-no Report  12/13/2013   CLINICAL DATA: port placement   FLOURO GUIDE CV LINE  Fluoroscopy was utilized by the requesting physician.  No radiographic  interpretation.       ASSESSMENT: 63y.o. [place] woman s/p biopsy of separate Right breast masses 10/11/2013 for a clinical mT1 N0, stage IA invasive ductal carcinoma, grade II-III, one of the mass is being 85% estrogen receptor positive, progesterone receptor negative, with an MIB-1 of 17% and HER-2 amplified.  (1) status post right mastectomy and sentinel lymph node sampling with immediate expander placement 12/13/2013 for a pT1c pN0 invasive ductal carcinoma, grade 3, again HER-2 positive.  (2) To start chemotherapy with carboplatin, docetaxel, trastuzumab and pertuzumab 01/24/2014. The plan is to repeat these agents every 21 days x6, after which the anti-HER-2 treatment will be continued to the total one year.  (3) the patient will not need postmastectomy radiation, so antiestrogen therapy will be started as soon as chemotherapy is completed  (4) rheumatoid arthritis, usually on Remicade,  prednisone  and methotrexate, held prior to mastectomy.  PLAN: We spent about well over an hour today going over Hanako's situation. One consideration would be for Taxol alone with the trastuzumab and pertuzumab, but my preference would be to try carboplatin/docetaxel, and if she does not tolerate the chemotherapy well then default to paclitaxel, always continuing the anti-HER-2 treatment. We discussed this at length today and the patient is a good understanding of the possible toxicities side effects and complications of these agents. She will also come to "chemotherapy school" next week.  Tentatively the plan will be to start chemotherapy on April 14, which should give her enough time to she'll from her current wound issues and will still be within the 2 month period we prefer for HER-2 positive patients. This there will be a several week delay, I have asked the patient to start methotrexate, at a lower dose, namely 35 mg per week, first dose March 18, last dose April 8.  I have entered the chemotherapy orders and also made appointments for the patient's first 3 cycles.  The patient will review her wound healing and also her antinausea and other supportive medications.meet with my 32 assistant on April 1 to review her wound healing and go over her antinausea and other supportive medications.   Quenesha has a good understanding of the overall plan. She agrees with it. She knows the goal of treatment is control. She will call with any problems that may develop before her next visit here.  Chauncey Cruel, MD   12/22/2013 5:24 PM

## 2013-12-23 ENCOUNTER — Other Ambulatory Visit: Payer: Self-pay | Admitting: *Deleted

## 2013-12-26 ENCOUNTER — Telehealth: Payer: Self-pay | Admitting: Oncology

## 2013-12-26 ENCOUNTER — Encounter (HOSPITAL_COMMUNITY): Payer: Self-pay

## 2013-12-26 ENCOUNTER — Ambulatory Visit (HOSPITAL_COMMUNITY)
Admission: RE | Admit: 2013-12-26 | Discharge: 2013-12-26 | Disposition: A | Discharge status: Home / Self Care | Payer: Medicare Other | Source: Ambulatory Visit | Attending: Internal Medicine | Admitting: Internal Medicine

## 2013-12-26 VITALS — BP 96/64 | HR 108 | Wt 154.4 lb

## 2013-12-26 DIAGNOSIS — C50319 Malignant neoplasm of lower-inner quadrant of unspecified female breast: Secondary | ICD-10-CM

## 2013-12-26 DIAGNOSIS — C50311 Malignant neoplasm of lower-inner quadrant of right female breast: Secondary | ICD-10-CM

## 2013-12-26 NOTE — Progress Notes (Signed)
Patient ID: Angela Gross, female   DOB: 08/30/1951, 63 y.o.   MRN: [id number] Primary oncologist: Dr. Jana Hakim  63 yo with history of breast cancer diagnosed in 12/14 s/p mastectomy presents to cardio-oncology clinic.  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  She has no significant cardiac history.  Her mother had CHF but we do not know the cause.  She has no exertional dyspnea.  She has occasional atypical, nonexertional chest pain that seems to be due to muscle spasm.  She had an echo done earlier this month that looked normal with EF 55-60%, lateral s' 11 cm/sec.   Patient will be starting chemotherapy with carboplatin, docetaxel, and Herceptin in 4/15.   PMH: 1. Breast cancer: Diagnosed in 12/14.  ER+/PR-/HER2neu+.  She is now s/p right mastectomy.  - Echo (3/15) with EF 55-60%, lateral s' 11 cm/sec (no strain) 2. Rheumatoid arthritis 3. Raynauds syndrome 4. OA 5. Degenerative disc disease: C-spine  SH: Lives with husband in [place], nonsmoker.   FH: Mother with CHF.   ROS: All systems reviewed and negative except as per HPI.   Current Outpatient Prescriptions  Medication Sig Dispense Refill  . cyclobenzaprine (FLEXERIL) 10 MG tablet Take 10 mg by mouth 3 (three) times daily as needed for muscle spasms.      . folic acid (FOLVITE) 1 MG tablet Take 1 mg by mouth daily.      . Liraglutide (VICTOZA [place]) Inject into the skin.      Marland Kitchen oxyCODONE (ROXICODONE) 5 MG immediate release tablet Take 1-2 tablets (5-10 mg total) by mouth every 4 (four) hours as needed for severe pain.  50 tablet  0  . traMADol (ULTRAM) 50 MG tablet Take 50 mg by mouth 2 (two) times daily as needed.      . traMADol (ULTRAM) 50 MG tablet Take 1-2 tablets (50-100 mg total) by mouth every 6 (six) hours as needed.  50 tablet  0  . Vitamin D, Ergocalciferol, (DRISDOL) 50000 UNITS CAPS capsule Take 50,000 Units by mouth every 7 (seven) days.       No current facility-administered medications for this encounter.   BP  96/64  Pulse 108  Wt 154 lb 6.4 oz (70.035 kg)  SpO2 97% General: NAD Neck: No JVD, no thyromegaly or thyroid nodule.  Lungs: Clear to auscultation bilaterally with normal respiratory effort. CV: Nondisplaced PMI.  Heart regular S1/S2, no S3/S4, no murmur.  No peripheral edema.  No carotid bruit.  Normal pedal pulses.  Abdomen: Soft, nontender, no hepatosplenomegaly, no distention.  Skin: Intact without lesions or rashes.  Neurologic: Alert and oriented x 3.  Psych: Normal affect. Extremities: No clubbing or cyanosis.  HEENT: Normal.   Assessment/Plan: 63 yo with history of breast cancer will be starting chemotherapy including chemotherapy in 4/15. We discussed the cardiac risk from Herceptin and the rationale for monitoring with regular echocardiograms while she is undergoing treatment.  Her baseline echo earlier this month was reviewed and looks good.   - She will followup in 3 months for repeat echocardiogram while on Herceptin.    Loralie Champagne 12/26/2013

## 2013-12-26 NOTE — Telephone Encounter (Signed)
, °

## 2013-12-26 NOTE — Patient Instructions (Signed)
We will contact you in 3 months to schedule your next appointment and echocardiogram  

## 2013-12-27 ENCOUNTER — Telehealth: Payer: Self-pay | Admitting: *Deleted

## 2013-12-27 ENCOUNTER — Ambulatory Visit (INDEPENDENT_AMBULATORY_CARE_PROVIDER_SITE_OTHER): Payer: Medicare Other | Admitting: General Surgery

## 2013-12-27 ENCOUNTER — Encounter (INDEPENDENT_AMBULATORY_CARE_PROVIDER_SITE_OTHER): Payer: Self-pay | Admitting: General Surgery

## 2013-12-27 VITALS — BP 126/78 | HR 75 | Temp 98.5°F | Resp 16 | Ht 60.0 in | Wt 153.0 lb

## 2013-12-27 DIAGNOSIS — C50311 Malignant neoplasm of lower-inner quadrant of right female breast: Secondary | ICD-10-CM

## 2013-12-27 DIAGNOSIS — C50319 Malignant neoplasm of lower-inner quadrant of unspecified female breast: Secondary | ICD-10-CM

## 2013-12-27 NOTE — Patient Instructions (Signed)
Your right mastectomy wound appears to be healing. There is no sign of infection or fluid collection. The skin is healthy  Keep your regular appointments with Dr. Iran Planas. She will decide when to remove the drain.  You need to be referred to physical therapy as soon as Dr. Iran Planas will allow that.  You may begin chemotherapy April 14, as planned  Return to see Dr. Dalbert Batman in 5 months.

## 2013-12-27 NOTE — Telephone Encounter (Signed)
Per staff message and POF I have scheduled appts.  JMW  

## 2013-12-27 NOTE — Progress Notes (Signed)
Patient ID: Angela Gross, female   DOB: 21-Sep-1951, 63 y.o.   MRN: [id number]  History: This patient underwent right total mastectomy, sentinel node biopsy and Port-A-Cath insertion on 12/13/2013.She then underwent right breast reconstruction with tissue expander, and excision of cervical bulky keloids by Dr. Iran Planas. Final pathology report showed invasive ductal carcinoma, 1.8 cm, negative margins, positive LVI,   negative lymph nodes(0/2),  ER +85%, PR negative, HER-2 positive, Ki 67 17%. Stage TI C., N0 (IA). She will not need radiation therapy. Dr. Jana Hakim plans to start chemotherapy on April 14, Herceptin based.   Exam: Is alert. Stable. No significant distress Right mastectomy wound with tissue expander in place and a single drain looked good. No sign of infection, skin necrosis, or fluid collections.    Assessment: Invasive duct carcinoma right breast, 1.8 cm, ER-positive, HER-2 positive, stage TI C., N0 Recovering uneventfully following right total mastectomy, sentinel node biopsy, insertion of tissue expander, and excision of multiple keloids by Dr. Iran Planas    Plan: Followup with Dr. Iran Planas and Dr. Jana Hakim as instructed Begin chemotherapy April 14 Ambulate more Physical therapy when allowed by Dr. Iran Planas Return to see me in 5 months.    Edsel Petrin. Dalbert Batman, M.D., [facility] General and Minimally invasive Surgery Breast and Colorectal Surgery Office:   [phone] Pager:   [phone]

## 2014-01-04 ENCOUNTER — Encounter: Payer: Self-pay | Admitting: *Deleted

## 2014-01-04 ENCOUNTER — Other Ambulatory Visit: Payer: No Typology Code available for payment source

## 2014-01-04 NOTE — Progress Notes (Signed)
[facility] Psychosocial Distress Screening Clinical Social Work  Clinical Social Work was referred by distress screening protocol.  The patient scored a 5 on the Psychosocial Distress Thermometer which indicates moderate distress. Clinical Social Worker contacted patient by phone to assess for distress and other psychosocial needs. The patient reports feeling much more at ease since attending the chemotherapy education class.  [name] shared many of the things she learned in her class and feels more prepared to begin treatment.  The patient indicated no other concerns.  CSW provided information on [employer] and support programs at [facility].  The patient plans to contact CSW if she is interested in the future.   Clinical Social Worker follow up needed: no  If yes, follow up plan:   Angela Gross, MSW, LCSW, OSW-C Clinical Social Worker [facility] [phone]

## 2014-01-06 ENCOUNTER — Encounter (INDEPENDENT_AMBULATORY_CARE_PROVIDER_SITE_OTHER): Payer: Self-pay

## 2014-01-09 ENCOUNTER — Observation Stay (HOSPITAL_COMMUNITY)
Admission: EM | Admit: 2014-01-09 | Discharge: 2014-01-11 | Disposition: A | Discharge status: Home / Self Care | Payer: Medicare Other | Attending: Plastic Surgery | Admitting: Plastic Surgery

## 2014-01-09 ENCOUNTER — Inpatient Hospital Stay: Admit: 2014-01-09 | Payer: Self-pay | Admitting: Plastic Surgery

## 2014-01-09 ENCOUNTER — Encounter (HOSPITAL_COMMUNITY): Payer: Self-pay | Admitting: Plastic Surgery

## 2014-01-09 ENCOUNTER — Encounter (HOSPITAL_COMMUNITY): Admission: EM | Disposition: A | Discharge status: Home / Self Care | Payer: Self-pay | Source: Home / Self Care

## 2014-01-09 DIAGNOSIS — Z853 Personal history of malignant neoplasm of breast: Secondary | ICD-10-CM

## 2014-01-09 DIAGNOSIS — Z79899 Other long term (current) drug therapy: Secondary | ICD-10-CM | POA: Insufficient documentation

## 2014-01-09 DIAGNOSIS — T8579XA Infection and inflammatory reaction due to other internal prosthetic devices, implants and grafts, initial encounter: Principal | ICD-10-CM | POA: Insufficient documentation

## 2014-01-09 DIAGNOSIS — N61 Mastitis without abscess: Secondary | ICD-10-CM | POA: Diagnosis present

## 2014-01-09 DIAGNOSIS — IMO0001 Reserved for inherently not codable concepts without codable children: Secondary | ICD-10-CM | POA: Insufficient documentation

## 2014-01-09 DIAGNOSIS — Y838 Other surgical procedures as the cause of abnormal reaction of the patient, or of later complication, without mention of misadventure at the time of the procedure: Secondary | ICD-10-CM | POA: Insufficient documentation

## 2014-01-09 DIAGNOSIS — Z9010 Acquired absence of unspecified breast and nipple: Secondary | ICD-10-CM | POA: Insufficient documentation

## 2014-01-09 DIAGNOSIS — I7300 Raynaud's syndrome without gangrene: Secondary | ICD-10-CM | POA: Insufficient documentation

## 2014-01-09 DIAGNOSIS — E119 Type 2 diabetes mellitus without complications: Secondary | ICD-10-CM | POA: Insufficient documentation

## 2014-01-09 DIAGNOSIS — M810 Age-related osteoporosis without current pathological fracture: Secondary | ICD-10-CM | POA: Insufficient documentation

## 2014-01-09 DIAGNOSIS — I739 Peripheral vascular disease, unspecified: Secondary | ICD-10-CM | POA: Insufficient documentation

## 2014-01-09 DIAGNOSIS — I10 Essential (primary) hypertension: Secondary | ICD-10-CM | POA: Insufficient documentation

## 2014-01-09 HISTORY — DX: Other chronic pain: G89.29

## 2014-01-09 HISTORY — DX: Syncope and collapse: R55

## 2014-01-09 HISTORY — DX: Rheumatoid arthritis, unspecified: M06.9

## 2014-01-09 HISTORY — DX: Bilateral primary osteoarthritis of knee: M17.0

## 2014-01-09 HISTORY — DX: Malignant neoplasm of unspecified site of unspecified female breast: C50.919

## 2014-01-09 HISTORY — DX: Other cervical disc degeneration, unspecified cervical region: M50.30

## 2014-01-09 HISTORY — DX: Pain in left shoulder: M25.512

## 2014-01-09 LAB — BASIC METABOLIC PANEL
BUN: 11 mg/dL (ref 6–23)
CALCIUM: 8.5 mg/dL (ref 8.4–10.5)
CO2: 25 mEq/L (ref 19–32)
Chloride: 100 mEq/L (ref 96–112)
Creatinine, Ser: 0.99 mg/dL (ref 0.50–1.10)
GFR, EST AFRICAN AMERICAN: 70 mL/min — AB (ref >90)
GFR, EST NON AFRICAN AMERICAN: 61 mL/min — AB (ref >90)
Glucose, Bld: 100 mg/dL — ABNORMAL HIGH (ref 70–99)
POTASSIUM: 4.2 meq/L (ref 3.7–5.3)
Sodium: 138 mEq/L (ref 137–147)

## 2014-01-09 LAB — GLUCOSE, CAPILLARY
Glucose-Capillary: 90 mg/dL (ref 70–99)
Glucose-Capillary: 121 mg/dL — ABNORMAL HIGH (ref 70–99)

## 2014-01-09 LAB — CBC
HCT: 35.1 % — ABNORMAL LOW (ref 36.0–46.0)
Hemoglobin: 11.9 g/dL — ABNORMAL LOW (ref 12.0–15.0)
MCH: 28.1 pg (ref 26.0–34.0)
MCHC: 33.9 g/dL (ref 30.0–36.0)
MCV: 82.8 fL (ref 78.0–100.0)
PLATELETS: 190 10*3/uL (ref 150–400)
RBC: 4.24 MIL/uL (ref 3.87–5.11)
RDW: 13.8 % (ref 11.5–15.5)
WBC: 18.4 10*3/uL — ABNORMAL HIGH (ref 4.0–10.5)

## 2014-01-09 SURGERY — INSERTION, TISSUE EXPANDER
Anesthesia: General | Laterality: Right

## 2014-01-09 MED ORDER — POTASSIUM CHLORIDE IN NACL 20-0.45 MEQ/L-% IV SOLN
INTRAVENOUS | Status: DC
  Filled 2014-01-09 (×2): qty 1000

## 2014-01-09 MED ORDER — POTASSIUM CHLORIDE IN NACL 20-0.45 MEQ/L-% IV SOLN
INTRAVENOUS | Status: DC
  Filled 2014-01-09: qty 1000

## 2014-01-09 MED ORDER — INSULIN ASPART 100 UNIT/ML ~~LOC~~ SOLN
2.0000 [IU] | SUBCUTANEOUS | Status: DC
  Administered 2014-01-09 – 2014-01-11 (×3): 2 [IU] via SUBCUTANEOUS
  Filled 2014-01-09 (×50): qty 0.06

## 2014-01-09 MED ORDER — MORPHINE SULFATE 2 MG/ML IJ SOLN
2.0000 mg | INTRAMUSCULAR | Status: DC | PRN
  Administered 2014-01-09: 2 mg via INTRAVENOUS
  Filled 2014-01-09: qty 1

## 2014-01-09 MED ORDER — POTASSIUM CHLORIDE IN NACL 20-0.45 MEQ/L-% IV SOLN
INTRAVENOUS | Status: DC
  Administered 2014-01-09 – 2014-01-10 (×3): via INTRAVENOUS
  Filled 2014-01-09 (×6): qty 1000

## 2014-01-09 MED ORDER — ACETAMINOPHEN 325 MG PO TABS
650.0000 mg | ORAL_TABLET | Freq: Four times a day (QID) | ORAL | Status: DC | PRN
  Administered 2014-01-09: 650 mg via ORAL
  Filled 2014-01-09 (×2): qty 2

## 2014-01-09 MED ORDER — ACETAMINOPHEN 650 MG RE SUPP
650.0000 mg | Freq: Four times a day (QID) | RECTAL | Status: DC | PRN
  Filled 2014-01-09: qty 1

## 2014-01-09 MED ORDER — ZINC SULFATE 220 (50 ZN) MG PO CAPS
220.0000 mg | ORAL_CAPSULE | Freq: Every day | ORAL | Status: DC
  Administered 2014-01-10: 220 mg via ORAL
  Filled 2014-01-09 (×2): qty 1

## 2014-01-09 MED ORDER — KCL IN DEXTROSE-NACL 20-5-0.45 MEQ/L-%-% IV SOLN
INTRAVENOUS | Status: DC
  Filled 2014-01-09 (×2): qty 1000

## 2014-01-09 MED ORDER — CIPROFLOXACIN IN D5W 400 MG/200ML IV SOLN
INTRAVENOUS | Status: AC
  Administered 2014-01-09: 400 mg via INTRAVENOUS
  Filled 2014-01-09: qty 200

## 2014-01-09 MED ORDER — CIPROFLOXACIN IN D5W 400 MG/200ML IV SOLN
400.0000 mg | Freq: Two times a day (BID) | INTRAVENOUS | Status: DC
  Administered 2014-01-09: 400 mg via INTRAVENOUS

## 2014-01-09 MED ORDER — CIPROFLOXACIN IN D5W 400 MG/200ML IV SOLN
400.0000 mg | Freq: Two times a day (BID) | INTRAVENOUS | Status: DC
  Administered 2014-01-10 – 2014-01-11 (×3): 400 mg via INTRAVENOUS
  Filled 2014-01-09 (×4): qty 200

## 2014-01-09 MED ORDER — ONDANSETRON HCL 4 MG/2ML IJ SOLN
4.0000 mg | Freq: Four times a day (QID) | INTRAMUSCULAR | Status: DC | PRN
  Administered 2014-01-10: 4 mg via INTRAVENOUS
  Filled 2014-01-09 (×2): qty 2

## 2014-01-09 MED ORDER — OXYCODONE HCL 5 MG PO TABS
5.0000 mg | ORAL_TABLET | ORAL | Status: DC | PRN
  Administered 2014-01-09 – 2014-01-11 (×5): 5 mg via ORAL
  Filled 2014-01-09 (×5): qty 1

## 2014-01-09 MED ORDER — VITAMIN C 500 MG PO TABS
500.0000 mg | ORAL_TABLET | Freq: Every day | ORAL | Status: DC
  Administered 2014-01-10: 500 mg via ORAL
  Filled 2014-01-09 (×2): qty 1

## 2014-01-09 MED ORDER — TAB-A-VITE/IRON PO TABS
1.0000 | ORAL_TABLET | Freq: Every day | ORAL | Status: DC
  Administered 2014-01-10: 1 via ORAL
  Filled 2014-01-09 (×2): qty 1

## 2014-01-09 MED ORDER — DOCUSATE SODIUM 100 MG PO CAPS
100.0000 mg | ORAL_CAPSULE | Freq: Two times a day (BID) | ORAL | Status: DC
  Administered 2014-01-09 – 2014-01-10 (×3): 100 mg via ORAL
  Filled 2014-01-09 (×2): qty 1

## 2014-01-09 SURGICAL SUPPLY — 39 items
BAG DECANTER FOR FLEXI CONT (MISCELLANEOUS) ×3 IMPLANT
BINDER BREAST LRG (GAUZE/BANDAGES/DRESSINGS) IMPLANT
BINDER BREAST XLRG (GAUZE/BANDAGES/DRESSINGS) IMPLANT
BIOPATCH RED 1 DISK 7.0 (GAUZE/BANDAGES/DRESSINGS) ×2 IMPLANT
BIOPATCH RED 1IN DISK 7.0MM (GAUZE/BANDAGES/DRESSINGS) ×1
CANISTER SUCTION 2500CC (MISCELLANEOUS) ×3 IMPLANT
CHLORAPREP W/TINT 26ML (MISCELLANEOUS) ×3 IMPLANT
COVER SURGICAL LIGHT HANDLE (MISCELLANEOUS) ×3 IMPLANT
DERMABOND ADVANCED (GAUZE/BANDAGES/DRESSINGS) ×2
DERMABOND ADVANCED .7 DNX12 (GAUZE/BANDAGES/DRESSINGS) ×1 IMPLANT
DRAIN CHANNEL 19F RND (DRAIN) ×3 IMPLANT
DRAPE ORTHO SPLIT 77X108 STRL (DRAPES) ×4
DRAPE SURG 17X23 STRL (DRAPES) ×12 IMPLANT
DRAPE SURG ORHT 6 SPLT 77X108 (DRAPES) ×2 IMPLANT
DRAPE WARM FLUID 44X44 (DRAPE) ×3 IMPLANT
DRSG PAD ABDOMINAL 8X10 ST (GAUZE/BANDAGES/DRESSINGS) ×6 IMPLANT
ELECT BLADE 4.0 EZ CLEAN MEGAD (MISCELLANEOUS) ×3
ELECT REM PT RETURN 9FT ADLT (ELECTROSURGICAL) ×3
ELECTRODE BLDE 4.0 EZ CLN MEGD (MISCELLANEOUS) ×1 IMPLANT
ELECTRODE REM PT RTRN 9FT ADLT (ELECTROSURGICAL) ×1 IMPLANT
EVACUATOR SILICONE 100CC (DRAIN) ×3 IMPLANT
GLOVE BIO SURGEON STRL SZ 6 (GLOVE) ×3 IMPLANT
GOWN STRL REUS W/ TWL LRG LVL3 (GOWN DISPOSABLE) ×2 IMPLANT
GOWN STRL REUS W/TWL LRG LVL3 (GOWN DISPOSABLE) ×4
KIT BASIN OR (CUSTOM PROCEDURE TRAY) ×3 IMPLANT
KIT ROOM TURNOVER OR (KITS) ×3 IMPLANT
NS IRRIG 1000ML POUR BTL (IV SOLUTION) ×6 IMPLANT
PACK GENERAL/GYN (CUSTOM PROCEDURE TRAY) ×3 IMPLANT
PAD ARMBOARD 7.5X6 YLW CONV (MISCELLANEOUS) ×3 IMPLANT
PIN SAFETY STERILE (MISCELLANEOUS) ×3 IMPLANT
SPONGE GAUZE 4X4 12PLY (GAUZE/BANDAGES/DRESSINGS) ×3 IMPLANT
STAPLER VISISTAT 35W (STAPLE) ×3 IMPLANT
SUT ETHILON 2 0 FS 18 (SUTURE) ×3 IMPLANT
SUT VIC AB 3-0 PS2 18 (SUTURE) ×2
SUT VIC AB 3-0 PS2 18XBRD (SUTURE) ×1 IMPLANT
SUT VIC AB 4-0 PS2 27 (SUTURE) ×3 IMPLANT
TOWEL OR 17X24 6PK STRL BLUE (TOWEL DISPOSABLE) ×3 IMPLANT
TOWEL OR 17X26 10 PK STRL BLUE (TOWEL DISPOSABLE) ×3 IMPLANT
TRAY FOLEY CATH 14FRSI W/METER (CATHETERS) IMPLANT

## 2014-01-09 NOTE — Progress Notes (Signed)
Reported called to nurse on 6north. All questions answered

## 2014-01-09 NOTE — H&P (Signed)
Angela Gross is an 63 y.o. female.   Chief Complaint: right breast HPI: The patient is a 63 yrs old bf here for treatment of a red breast.  She underwent right breast mastectomy with expander and FlexHd placement.  The drain fell out in the past 24 hrs and the patient was concerned about redness around the lower breast area.  She is otherwise stable with vitals in normal limits.  Past Medical History  Diagnosis Date  . Raynaud disease   . Osteoporosis   . Hypertension   . Arthritis     on Remicade  . PONV (postoperative nausea and vomiting)   . Wears glasses   . Full dentures   . Fibromyalgia   . Diabetes mellitus without complication     has not taken meds in over a month    Past Surgical History  Procedure Laterality Date  . Cholecystectomy    . Vaginal hysterectomy      no salpingo-oophoretomu  . Colonoscopy    . Mastectomy w/ sentinel node biopsy Right 12/13/2013    Procedure: RIGHT TOTAL MASTECTOMY WITH SENTINEL LYMPH NODE BIOPSY;  Surgeon: Adin Hector, MD;  Location: [place];  Service: General;  Laterality: Right;  . Portacath placement Right 12/13/2013    Procedure: INSERTION PORT-A-CATH;  Surgeon: Adin Hector, MD;  Location: [place];  Service: General;  Laterality: Right;  . Breast reconstruction with placement of tissue expander and flex hd (acellular hydrated dermis) Right 12/13/2013    Procedure: RIGHT BREAST RECONSTRUCTION WITH PLACEMENT OF TISSUE EXPANDER AND FLEX HD (ACELLULAR HYDRATED DERMIS);  Surgeon: Irene Limbo, MD;  Location: [place];  Service: Plastics;  Laterality: Right;  . Lipoma excision Bilateral 12/13/2013    Procedure: EXCISION OF LEFT BREAST KELOID AND RIGHT CHEST KELOID;  Surgeon: Irene Limbo, MD;  Location: [place];  Service: Plastics;  Laterality: Bilateral;    History reviewed. No pertinent family history. Social History:  reports that she has never smoked. She  does not have any smokeless tobacco history on file. She reports that she does not drink alcohol or use illicit drugs.  Allergies:  Allergies  Allergen Reactions  . Codeine Other (See Comments) and Hives    Altered mental status, numbness  Altered mental status, Numbess    Medications Prior to Admission  Medication Sig Dispense Refill  . cyclobenzaprine (FLEXERIL) 10 MG tablet Take 10 mg by mouth 3 (three) times daily as needed for muscle spasms.      . folic acid (FOLVITE) 1 MG tablet Take 1 mg by mouth daily.      . Liraglutide (VICTOZA [place]) Inject into the skin.      Marland Kitchen oxyCODONE (ROXICODONE) 5 MG immediate release tablet Take 1-2 tablets (5-10 mg total) by mouth every 4 (four) hours as needed for severe pain.  50 tablet  0  . traMADol (ULTRAM) 50 MG tablet Take 50 mg by mouth 2 (two) times daily as needed.      . traMADol (ULTRAM) 50 MG tablet Take 1-2 tablets (50-100 mg total) by mouth every 6 (six) hours as needed.  50 tablet  0  . Vitamin D, Ergocalciferol, (DRISDOL) 50000 UNITS CAPS capsule Take 50,000 Units by mouth every 7 (seven) days.        No results found for this or any previous visit (from the past 48 hour(s)). No results found.  Review of Systems  Constitutional: Negative.   HENT: Negative.  Eyes: Negative.   Respiratory: Negative.   Cardiovascular: Negative.   Gastrointestinal: Negative.   Genitourinary: Negative.   Musculoskeletal: Negative.   Skin: Negative.   Neurological: Negative.   Psychiatric/Behavioral: Negative.     There were no vitals taken for this visit. Physical Exam  Constitutional: She appears well-developed and well-nourished.  HENT:  Head: Normocephalic and atraumatic.  Eyes: Conjunctivae and EOM are normal. Pupils are equal, round, and reactive to light.  Cardiovascular: Normal rate.   Respiratory: Effort normal.    GI: Soft.  Musculoskeletal: Normal range of motion.  Neurological: She is alert.  Skin: Skin is warm.    Psychiatric: She has a normal mood and affect. Her behavior is normal. Judgment and thought content normal.     Assessment/Plan Plan for admission, possible surgery in the morning. NPO after midnight.  SANGER,CLAIRE 01/09/2014, 3:26 PM

## 2014-01-10 ENCOUNTER — Encounter (HOSPITAL_COMMUNITY): Payer: Self-pay | Admitting: General Practice

## 2014-01-10 ENCOUNTER — Encounter (HOSPITAL_COMMUNITY): Admission: EM | Disposition: A | Discharge status: Home / Self Care | Payer: Self-pay | Source: Home / Self Care

## 2014-01-10 ENCOUNTER — Encounter (HOSPITAL_COMMUNITY): Payer: Medicare Other | Admitting: Anesthesiology

## 2014-01-10 ENCOUNTER — Observation Stay (HOSPITAL_COMMUNITY): Payer: Medicare Other | Admitting: Anesthesiology

## 2014-01-10 DIAGNOSIS — Z853 Personal history of malignant neoplasm of breast: Secondary | ICD-10-CM

## 2014-01-10 HISTORY — PX: TISSUE EXPANDER PLACEMENT: SHX2530

## 2014-01-10 LAB — GLUCOSE, CAPILLARY
GLUCOSE-CAPILLARY: 101 mg/dL — AB (ref 70–99)
GLUCOSE-CAPILLARY: 105 mg/dL — AB (ref 70–99)
GLUCOSE-CAPILLARY: 112 mg/dL — AB (ref 70–99)
GLUCOSE-CAPILLARY: 121 mg/dL — AB (ref 70–99)
GLUCOSE-CAPILLARY: 77 mg/dL (ref 70–99)
GLUCOSE-CAPILLARY: 98 mg/dL (ref 70–99)
Glucose-Capillary: 108 mg/dL — ABNORMAL HIGH (ref 70–99)
Glucose-Capillary: 110 mg/dL — ABNORMAL HIGH (ref 70–99)
Glucose-Capillary: 118 mg/dL — ABNORMAL HIGH (ref 70–99)

## 2014-01-10 LAB — SURGICAL PCR SCREEN
MRSA, PCR: NEGATIVE
Staphylococcus aureus: POSITIVE — AB

## 2014-01-10 SURGERY — INSERTION, TISSUE EXPANDER
Anesthesia: General | Site: Breast | Laterality: Right

## 2014-01-10 MED ORDER — SODIUM CHLORIDE 0.9 % IV BOLUS (SEPSIS)
250.0000 mL | Freq: Once | INTRAVENOUS | Status: AC
  Administered 2014-01-10: 250 mL via INTRAVENOUS

## 2014-01-10 MED ORDER — LIDOCAINE HCL (CARDIAC) 20 MG/ML IV SOLN
INTRAVENOUS | Status: DC | PRN
  Administered 2014-01-10: 80 mg via INTRAVENOUS

## 2014-01-10 MED ORDER — AMOXICILLIN-POT CLAVULANATE 875-125 MG PO TABS
1.0000 | ORAL_TABLET | Freq: Two times a day (BID) | ORAL | Status: DC
Start: 2014-01-10 — End: 2014-01-13

## 2014-01-10 MED ORDER — SODIUM CHLORIDE 0.9 % IJ SOLN: 10.0000 mL | Freq: Two times a day (BID) | INTRAMUSCULAR | Status: DC

## 2014-01-10 MED ORDER — PHENOL 1.4 % MT LIQD
1.0000 | OROMUCOSAL | Status: DC | PRN
  Administered 2014-01-10: 1 via OROMUCOSAL
  Filled 2014-01-10: qty 177

## 2014-01-10 MED ORDER — FENTANYL CITRATE 0.05 MG/ML IJ SOLN
INTRAMUSCULAR | Status: AC
  Filled 2014-01-10: qty 5

## 2014-01-10 MED ORDER — PHENYLEPHRINE HCL 10 MG/ML IJ SOLN
INTRAMUSCULAR | Status: AC
  Filled 2014-01-10: qty 1

## 2014-01-10 MED ORDER — FENTANYL CITRATE 0.05 MG/ML IJ SOLN
INTRAMUSCULAR | Status: DC | PRN
  Administered 2014-01-10: 50 ug via INTRAVENOUS
  Administered 2014-01-10 (×2): 25 ug via INTRAVENOUS
  Administered 2014-01-10: 150 ug via INTRAVENOUS

## 2014-01-10 MED ORDER — MUPIROCIN 2 % EX OINT
1.0000 "application " | TOPICAL_OINTMENT | Freq: Two times a day (BID) | CUTANEOUS | Status: DC
  Administered 2014-01-10: 1 via NASAL
  Filled 2014-01-10: qty 22

## 2014-01-10 MED ORDER — PROPOFOL 10 MG/ML IV BOLUS
INTRAVENOUS | Status: AC
  Filled 2014-01-10: qty 20

## 2014-01-10 MED ORDER — ONDANSETRON HCL 4 MG/2ML IJ SOLN
INTRAMUSCULAR | Status: DC | PRN
  Administered 2014-01-10: 4 mg via INTRAVENOUS

## 2014-01-10 MED ORDER — SODIUM CHLORIDE 0.9 % IV SOLN
INTRAVENOUS | Status: DC | PRN
  Administered 2014-01-10: 12:00:00

## 2014-01-10 MED ORDER — SODIUM CHLORIDE 0.9 % IV SOLN
INTRAVENOUS | Status: DC
  Administered 2014-01-10 (×2): via INTRAVENOUS

## 2014-01-10 MED ORDER — MIDAZOLAM HCL 2 MG/2ML IJ SOLN
INTRAMUSCULAR | Status: AC
  Filled 2014-01-10: qty 2

## 2014-01-10 MED ORDER — CHLORHEXIDINE GLUCONATE CLOTH 2 % EX PADS
6.0000 | MEDICATED_PAD | Freq: Every day | CUTANEOUS | Status: DC
  Administered 2014-01-10: 6 via TOPICAL

## 2014-01-10 MED ORDER — OXYCODONE HCL 5 MG PO TABS: 5.0000 mg | ORAL_TABLET | ORAL | Status: DC | PRN

## 2014-01-10 MED ORDER — SODIUM CHLORIDE 0.9 % IR SOLN
Status: DC | PRN
  Administered 2014-01-10: 1000 mL

## 2014-01-10 MED ORDER — ESMOLOL HCL 10 MG/ML IV SOLN
INTRAVENOUS | Status: DC | PRN
  Administered 2014-01-10 (×3): 10 mg via INTRAVENOUS

## 2014-01-10 MED ORDER — SODIUM CHLORIDE 0.9 % IV SOLN
INTRAVENOUS | Status: DC
  Filled 2014-01-10: qty 1

## 2014-01-10 MED ORDER — EPHEDRINE SULFATE 50 MG/ML IJ SOLN
INTRAMUSCULAR | Status: AC
  Filled 2014-01-10: qty 1

## 2014-01-10 MED ORDER — POVIDONE-IODINE 10 % EX SOLN
CUTANEOUS | Status: DC | PRN
  Administered 2014-01-10: 1 via TOPICAL

## 2014-01-10 MED ORDER — PROPOFOL 10 MG/ML IV BOLUS
INTRAVENOUS | Status: DC | PRN
  Administered 2014-01-10: 180 mg via INTRAVENOUS

## 2014-01-10 MED ORDER — FENTANYL CITRATE 0.05 MG/ML IJ SOLN: 25.0000 ug | INTRAMUSCULAR | Status: DC | PRN

## 2014-01-10 MED ORDER — PHENYLEPHRINE 40 MCG/ML (10ML) SYRINGE FOR IV PUSH (FOR BLOOD PRESSURE SUPPORT)
PREFILLED_SYRINGE | INTRAVENOUS | Status: AC
  Filled 2014-01-10: qty 10

## 2014-01-10 MED ORDER — 0.9 % SODIUM CHLORIDE (POUR BTL) OPTIME
TOPICAL | Status: DC | PRN
  Administered 2014-01-10 (×2): 1000 mL

## 2014-01-10 MED ORDER — SODIUM CHLORIDE 0.9 % IJ SOLN: 10.0000 mL | INTRAMUSCULAR | Status: DC | PRN

## 2014-01-10 SURGICAL SUPPLY — 52 items
BAG DECANTER FOR FLEXI CONT (MISCELLANEOUS) ×3 IMPLANT
BINDER BREAST LRG (GAUZE/BANDAGES/DRESSINGS) ×3 IMPLANT
BINDER BREAST XLRG (GAUZE/BANDAGES/DRESSINGS) IMPLANT
BIOPATCH RED 1 DISK 7.0 (GAUZE/BANDAGES/DRESSINGS) ×2 IMPLANT
BIOPATCH RED 1IN DISK 7.0MM (GAUZE/BANDAGES/DRESSINGS) ×1
CANISTER SUCTION 2500CC (MISCELLANEOUS) ×3 IMPLANT
CHLORAPREP W/TINT 26ML (MISCELLANEOUS) ×3 IMPLANT
COVER SURGICAL LIGHT HANDLE (MISCELLANEOUS) ×3 IMPLANT
DERMABOND ADHESIVE PROPEN (GAUZE/BANDAGES/DRESSINGS) ×2
DERMABOND ADVANCED (GAUZE/BANDAGES/DRESSINGS) ×2
DERMABOND ADVANCED .7 DNX12 (GAUZE/BANDAGES/DRESSINGS) ×1 IMPLANT
DERMABOND ADVANCED .7 DNX6 (GAUZE/BANDAGES/DRESSINGS) ×1 IMPLANT
DRAIN CHANNEL 15F RND FF W/TCR (WOUND CARE) ×3 IMPLANT
DRAIN CHANNEL 19F RND (DRAIN) IMPLANT
DRAPE ORTHO SPLIT 77X108 STRL (DRAPES) ×4
DRAPE SURG 17X23 STRL (DRAPES) ×12 IMPLANT
DRAPE SURG ORHT 6 SPLT 77X108 (DRAPES) ×2 IMPLANT
DRAPE WARM FLUID 44X44 (DRAPE) ×3 IMPLANT
DRSG PAD ABDOMINAL 8X10 ST (GAUZE/BANDAGES/DRESSINGS) ×6 IMPLANT
ELECT BLADE 4.0 EZ CLEAN MEGAD (MISCELLANEOUS) ×3
ELECT REM PT RETURN 9FT ADLT (ELECTROSURGICAL) ×3
ELECTRODE BLDE 4.0 EZ CLN MEGD (MISCELLANEOUS) ×1 IMPLANT
ELECTRODE REM PT RTRN 9FT ADLT (ELECTROSURGICAL) ×1 IMPLANT
EVACUATOR SILICONE 100CC (DRAIN) ×3 IMPLANT
GLOVE BIO SURGEON STRL SZ 6 (GLOVE) ×6 IMPLANT
GLOVE BIO SURGEON STRL SZ8.5 (GLOVE) ×3 IMPLANT
GLOVE BIOGEL PI IND STRL 7.5 (GLOVE) ×1 IMPLANT
GLOVE BIOGEL PI INDICATOR 7.5 (GLOVE) ×2
GLOVE ECLIPSE 7.5 STRL STRAW (GLOVE) ×6 IMPLANT
GOWN STRL REIN 2XL LVL4 (GOWN DISPOSABLE) ×3 IMPLANT
GOWN STRL REUS W/ TWL LRG LVL3 (GOWN DISPOSABLE) ×2 IMPLANT
GOWN STRL REUS W/TWL LRG LVL3 (GOWN DISPOSABLE) ×4
IMPL BREAST TIS EXP M 450CC (Breast) ×1 IMPLANT
IMPLANT BREAST TIS EXP M 450CC (Breast) ×3 IMPLANT
KIT BASIN OR (CUSTOM PROCEDURE TRAY) ×3 IMPLANT
KIT ROOM TURNOVER OR (KITS) ×3 IMPLANT
NS IRRIG 1000ML POUR BTL (IV SOLUTION) ×6 IMPLANT
PACK GENERAL/GYN (CUSTOM PROCEDURE TRAY) ×3 IMPLANT
PAD ABD 8X10 STRL (GAUZE/BANDAGES/DRESSINGS) ×3 IMPLANT
PAD ARMBOARD 7.5X6 YLW CONV (MISCELLANEOUS) ×3 IMPLANT
PIN SAFETY STERILE (MISCELLANEOUS) ×3 IMPLANT
SET ASEPTIC TRANSFER (MISCELLANEOUS) ×3 IMPLANT
SPONGE GAUZE 4X4 12PLY (GAUZE/BANDAGES/DRESSINGS) ×3 IMPLANT
STAPLER VISISTAT 35W (STAPLE) ×3 IMPLANT
SUT ETHILON 2 0 FS 18 (SUTURE) ×3 IMPLANT
SUT MNCRL AB 4-0 PS2 18 (SUTURE) ×3 IMPLANT
SUT VIC AB 3-0 PS2 18 (SUTURE) ×2
SUT VIC AB 3-0 PS2 18XBRD (SUTURE) ×1 IMPLANT
SUT VIC AB 4-0 PS2 27 (SUTURE) ×3 IMPLANT
TOWEL OR 17X24 6PK STRL BLUE (TOWEL DISPOSABLE) ×3 IMPLANT
TOWEL OR 17X26 10 PK STRL BLUE (TOWEL DISPOSABLE) ×3 IMPLANT
TRAY FOLEY CATH 14FRSI W/METER (CATHETERS) IMPLANT

## 2014-01-10 NOTE — Op Note (Signed)
Operative Note   DATE OF OPERATION: 3.31.2015  LOCATION: [facility]- observation  SURGICAL DIVISION: Plastic Surgery  PREOPERATIVE DIAGNOSES:  1. Acquired absence of right breast 2. Right breast cancer 3. Cellulitis right reconstructed breast  POSTOPERATIVE DIAGNOSES:  same  PROCEDURE:  1. Drainage of right breast seroma 2. Exchange tissue expander right reconstructed breast  SURGEON: Irene Limbo MD MBA  ASSISTANT: none  ANESTHESIA:  General.   EBL: 476 ml  COMPLICATIONS: None.   INDICATIONS FOR PROCEDURE:  The patient, Angela Gross, is a 63 y.o. female born on August 24, 1951, is here for cellulitis right reconstructed breast that developed following prior drain fell out at home.    FINDINGS: Cloudy seroma fluid. Tissue expander exchanged, Menor style 9200 450 ml  SN [id number],  Initial fill volume 400 ml.   DESCRIPTION OF PROCEDURE:  The patient's operative site was marked with the patient in the preoperative area. The patient was taken to the operating room. SCDs were placed. Patient recently received her scheduled IV antibiotics and no additional antibiotics were given. The patient's operative site was prepped and draped in a sterile fashion. A time out was performed and all information was confirmed to be correct.  Limited incision made through previous mastectomy scar. Incision carried through subcutaneous tissue and capsule entered. Large amount cloudy fluid drained. Tissue expander removed. Cavity curreted throughout and non incorporated cellular dermis excised. Cavity irrigated with saline followed by solution containing Ancef, gentamicin, and bacitracin. Gloves were changed and new instruments used following this. 15 Fr drain placed percutaneously and secured to skin with 2-0 nylon. New tissue expander prepared and placed in cavity. Expander secured to chest wall with 3-0 nylon. Closure completed with 3-0 vicryl to approximate fascia, 4-0 vicryl in dermis and 4-0 monocryl  subcuticular for skin closure. Dermabond and dry dressing applied.   The patient was allowed to wake from anesthesia, extubated and taken to the recovery room in satisfactory condition.   SPECIMENS: none  DRAINS: 15 Fr in right chest  Irene Limbo, MD [facility] Plastic & Reconstructive Surgery [phone]

## 2014-01-10 NOTE — Anesthesia Procedure Notes (Addendum)
Procedure Name: LMA Insertion Date/Time: 01/10/2014 11:09 AM Performed by: Maeola Harman Pre-anesthesia Checklist: Patient identified, Emergency Drugs available, Suction available, Patient being monitored and Timeout performed Patient Re-evaluated:Patient Re-evaluated prior to inductionOxygen Delivery Method: Circle system utilized Preoxygenation: Pre-oxygenation with 100% oxygen Intubation Type: IV induction Ventilation: Mask ventilation without difficulty LMA: LMA inserted and LMA with gastric port inserted LMA Size: 4.0 Number of attempts: 1 Placement Confirmation: positive ETCO2 and breath sounds checked- equal and bilateral Tube secured with: Tape Comments: Easy atraumatic induction and LMA insertion.  Dr. Oletta Lamas verified placement. Waldron Session, CRNA

## 2014-01-10 NOTE — Anesthesia Preprocedure Evaluation (Addendum)
Anesthesia Evaluation  Patient identified by MRN, date of birth, ID band Patient awake    Reviewed: Allergy & Precautions, H&P , NPO status , Patient's Chart, lab work & pertinent test results  History of Anesthesia Complications (+) PONV  Airway Mallampati: II TM Distance: >3 FB     Dental  (+) Edentulous Upper, Edentulous Lower   Pulmonary neg pulmonary ROS,  breath sounds clear to auscultation        Cardiovascular hypertension, + Peripheral Vascular Disease Rhythm:Regular     Neuro/Psych  Neuromuscular disease    GI/Hepatic negative GI ROS, Neg liver ROS,   Endo/Other  diabetes, Well Controlled  Renal/GU negative Renal ROS     Musculoskeletal  (+) Arthritis -,   Abdominal (+)  Abdomen: soft. Bowel sounds: normal.  Peds  Hematology   Anesthesia Other Findings   Reproductive/Obstetrics                         Anesthesia Physical Anesthesia Plan  ASA: III  Anesthesia Plan: General   Post-op Pain Management:    Induction: Intravenous  Airway Management Planned: Oral ETT  Additional Equipment:   Intra-op Plan:   Post-operative Plan: Extubation in OR  Informed Consent: I have reviewed the patients History and Physical, chart, labs and discussed the procedure including the risks, benefits and alternatives for the proposed anesthesia with the patient or authorized representative who has indicated his/her understanding and acceptance.   Dental advisory given  Plan Discussed with: Anesthesiologist, CRNA and Surgeon  Anesthesia Plan Comments:         Anesthesia Quick Evaluation

## 2014-01-10 NOTE — Anesthesia Postprocedure Evaluation (Signed)
  Anesthesia Post-op Note  Patient: Angela Gross  Procedure(s) Performed: Procedure(s): Incision and Drainage Right Breast Seroma with TISSUE EXPANDER exchange (Right)  Patient Location: PACU  Anesthesia Type:General  Level of Consciousness: awake  Airway and Oxygen Therapy: Patient Spontanous Breathing  Post-op Pain: mild  Post-op Assessment: Post-op Vital signs reviewed  Post-op Vital Signs: Reviewed  Complications: No apparent anesthesia complications

## 2014-01-10 NOTE — Interval H&P Note (Signed)
History and Physical Interval Note:  01/10/2014 10:18 AM  Angela Gross  has presented today for surgery, with the diagnosis of Right Breast Seroma  The various methods of treatment have been discussed with the patient and family. After consideration of risks, benefits and other options for treatment, the patient has consented to  Procedure(s) with comments: Incision and Drainage Right Breast Seroma with possible TISSUE EXPANDER exchange as a surgical intervention .  The patient's history has been reviewed, patient examined, no change in status, stable for surgery.  I have reviewed the patient's chart and labs.  Questions were answered to the patient's satisfaction.     [name]

## 2014-01-10 NOTE — Progress Notes (Signed)
pts hr reamins in 1                   pts hr remains in 120,s dr edwards here and at bedside no rx ordered ok to return to room dr Iran Planas notifed no rx ordered may return to room

## 2014-01-10 NOTE — Progress Notes (Signed)
HD#2 Reports overnight she again drained from old drain site, enough to soak bed sheets  Temp:  [98.6 F (37 C)-101.8 F (38.8 C)] 98.6 F (37 C) (03/31 0511) Pulse Rate:  [76-127] 108 (03/31 0511) Resp:  [11-20] 17 (03/31 0511) BP: (91-125)/(58-73) 91/61 mmHg (03/31 0511) SpO2:  [93 %-100 %] 97 % (03/31 0511) Weight:  [69.4 kg (153 lb)] 69.4 kg (153 lb) (03/30 1702)  Chest with some edema, and improved cellulitis medial breast  A/P Recurrent drainage from reconstructed breast, febrile overnight Plan replacement drain in [place], MD [facility] Plastic & Reconstructive Surgery [phone]

## 2014-01-10 NOTE — Transfer of Care (Signed)
Immediate Anesthesia Transfer of Care Note  Patient: Angela Gross  Procedure(s) Performed: Procedure(s): Incision and Drainage Right Breast Seroma with TISSUE EXPANDER exchange (Right)  Patient Location: PACU  Anesthesia Type:General  Level of Consciousness: awake, alert  and sedated  Airway & Oxygen Therapy: Patient Spontanous Breathing  Post-op Assessment: Report given to PACU RN  Post vital signs: stable  Complications: No apparent anesthesia complications

## 2014-01-11 ENCOUNTER — Telehealth: Payer: Self-pay | Admitting: Oncology

## 2014-01-11 ENCOUNTER — Other Ambulatory Visit (HOSPITAL_BASED_OUTPATIENT_CLINIC_OR_DEPARTMENT_OTHER): Payer: Medicare Other

## 2014-01-11 ENCOUNTER — Telehealth: Payer: Self-pay | Admitting: *Deleted

## 2014-01-11 ENCOUNTER — Ambulatory Visit (HOSPITAL_BASED_OUTPATIENT_CLINIC_OR_DEPARTMENT_OTHER): Payer: Medicare Other | Admitting: Physician Assistant

## 2014-01-11 ENCOUNTER — Encounter: Payer: Self-pay | Admitting: Physician Assistant

## 2014-01-11 ENCOUNTER — Ambulatory Visit (HOSPITAL_BASED_OUTPATIENT_CLINIC_OR_DEPARTMENT_OTHER): Payer: Medicare Other

## 2014-01-11 VITALS — BP 108/74 | HR 95 | Temp 97.2°F | Resp 18 | Ht 60.0 in | Wt 152.6 lb

## 2014-01-11 VITALS — BP 108/74 | HR 95 | Temp 97.2°F

## 2014-01-11 DIAGNOSIS — C50919 Malignant neoplasm of unspecified site of unspecified female breast: Secondary | ICD-10-CM

## 2014-01-11 DIAGNOSIS — E119 Type 2 diabetes mellitus without complications: Secondary | ICD-10-CM

## 2014-01-11 DIAGNOSIS — C50911 Malignant neoplasm of unspecified site of right female breast: Secondary | ICD-10-CM

## 2014-01-11 DIAGNOSIS — C50311 Malignant neoplasm of lower-inner quadrant of right female breast: Secondary | ICD-10-CM

## 2014-01-11 DIAGNOSIS — N61 Mastitis without abscess: Secondary | ICD-10-CM

## 2014-01-11 DIAGNOSIS — Z170 Estrogen receptor positive status [ER+]: Secondary | ICD-10-CM

## 2014-01-11 DIAGNOSIS — Z95828 Presence of other vascular implants and grafts: Secondary | ICD-10-CM

## 2014-01-11 DIAGNOSIS — M2550 Pain in unspecified joint: Secondary | ICD-10-CM

## 2014-01-11 LAB — COMPREHENSIVE METABOLIC PANEL (CC13)
ALT: 43 U/L (ref 0–55)
AST: 69 U/L — AB (ref 5–34)
Albumin: 2.1 g/dL — ABNORMAL LOW (ref 3.5–5.0)
Alkaline Phosphatase: 71 U/L (ref 40–150)
Anion Gap: 10 mEq/L (ref 3–11)
BUN: 7 mg/dL (ref 7.0–26.0)
CALCIUM: 8.4 mg/dL (ref 8.4–10.4)
CHLORIDE: 108 meq/L (ref 98–109)
CO2: 21 mEq/L — ABNORMAL LOW (ref 22–29)
Creatinine: 0.7 mg/dL (ref 0.6–1.1)
Glucose: 96 mg/dl (ref 70–140)
Potassium: 4.1 mEq/L (ref 3.5–5.1)
SODIUM: 139 meq/L (ref 136–145)
Total Bilirubin: 0.24 mg/dL (ref 0.20–1.20)
Total Protein: 6.4 g/dL (ref 6.4–8.3)

## 2014-01-11 LAB — CBC WITH DIFFERENTIAL/PLATELET
BASO%: 0.2 % (ref 0.0–2.0)
Basophils Absolute: 0 10*3/uL (ref 0.0–0.1)
EOS%: 1.2 % (ref 0.0–7.0)
Eosinophils Absolute: 0.3 10*3/uL (ref 0.0–0.5)
HCT: 31.3 % — ABNORMAL LOW (ref 34.8–46.6)
HGB: 10.1 g/dL — ABNORMAL LOW (ref 11.6–15.9)
LYMPH#: 2.4 10*3/uL (ref 0.9–3.3)
LYMPH%: 10.3 % — ABNORMAL LOW (ref 14.0–49.7)
MCH: 26.3 pg (ref 25.1–34.0)
MCHC: 32.1 g/dL (ref 31.5–36.0)
MCV: 82 fL (ref 79.5–101.0)
MONO#: 3.4 10*3/uL — ABNORMAL HIGH (ref 0.1–0.9)
MONO%: 14.7 % — ABNORMAL HIGH (ref 0.0–14.0)
NEUT#: 17.2 10*3/uL — ABNORMAL HIGH (ref 1.5–6.5)
NEUT%: 73.6 % (ref 38.4–76.8)
Platelets: 225 10*3/uL (ref 145–400)
RBC: 3.82 10*6/uL (ref 3.70–5.45)
RDW: 13.8 % (ref 11.2–14.5)
WBC: 23.4 10*3/uL — AB (ref 3.9–10.3)

## 2014-01-11 LAB — GLUCOSE, CAPILLARY
GLUCOSE-CAPILLARY: 87 mg/dL (ref 70–99)
GLUCOSE-CAPILLARY: 133 mg/dL — AB (ref 70–99)

## 2014-01-11 LAB — TECHNOLOGIST REVIEW

## 2014-01-11 MED ORDER — LIDOCAINE-PRILOCAINE 2.5-2.5 % EX CREA: 1.0000 "application " | TOPICAL_CREAM | CUTANEOUS | Status: DC | PRN

## 2014-01-11 MED ORDER — DEXAMETHASONE 4 MG PO TABS: ORAL_TABLET | ORAL | Status: DC

## 2014-01-11 MED ORDER — ONDANSETRON HCL 8 MG PO TABS: ORAL_TABLET | ORAL | Status: DC

## 2014-01-11 MED ORDER — SODIUM CHLORIDE 0.9 % IJ SOLN
10.0000 mL | INTRAMUSCULAR | Status: DC | PRN
  Administered 2014-01-11: 10 mL via INTRAVENOUS
  Filled 2014-01-11: qty 10

## 2014-01-11 MED ORDER — LORAZEPAM 0.5 MG PO TABS: 0.2500 mg | ORAL_TABLET | Freq: Every evening | ORAL | Status: DC | PRN

## 2014-01-11 MED ORDER — HEPARIN SOD (PORK) LOCK FLUSH 100 UNIT/ML IV SOLN
500.0000 [IU] | Freq: Once | INTRAVENOUS | Status: AC
  Administered 2014-01-11: 500 [IU] via INTRAVENOUS
  Filled 2014-01-11: qty 5

## 2014-01-11 MED ORDER — PROCHLORPERAZINE MALEATE 10 MG PO TABS: ORAL_TABLET | ORAL | Status: DC

## 2014-01-11 MED ORDER — MUPIROCIN 2 % EX OINT: 1.0000 "application " | TOPICAL_OINTMENT | Freq: Two times a day (BID) | CUTANEOUS | Status: DC

## 2014-01-11 NOTE — Telephone Encounter (Signed)
, °

## 2014-01-11 NOTE — Discharge Summary (Signed)
Physician Discharge Summary  Patient ID: Angela Gross MRN: [id number] DOB/AGE: 1951-07-08 63 y.o.  Admit date: 01/09/2014 Discharge date: 01/11/2014  Admission Diagnoses:  Discharge Diagnoses:  Active Problems:   Breast infection in female   Personal history of malignant neoplasm of breast   Discharged Condition: stable  Hospital Course: Admitted for cellulitis reconstructed breast after drain fell out. Underwent surgery for drainage seroma and exchange tissue expander as she had repeat spontaneous drainage from old drain site.. Maintained on IV antibiotics and will transition to oral Augmentin. Demonstrated improvement in cellulitis.   Treatments: surgery: exchange tissue expander, drainage seroma   Disposition: 01-Home or Self Care  Discharge Orders   Future Appointments Provider Department Dept Phone   01/11/2014 10:15 AM [facility] [phone]   01/11/2014 10:45 AM Amy Fredderick [facility] [phone]   01/20/2014 9:00 AM [facility] [phone]   01/20/2014 9:30 AM Chauncey Cruel, [facility] [phone]   01/24/2014 8:30 AM [facility] [phone]   01/25/2014 10:00 AM [facility] Nurse [facility] [phone]   01/31/2014 1:15 PM [facility] [phone]   01/31/2014 1:45 PM Amy G Berry, [facility] [phone]   02/14/2014 8:00 AM [facility] [phone]   02/14/2014 8:30 AM Chauncey Cruel, MD [facility] [phone]   02/14/2014 8:30 AM [facility] [phone]   02/15/2014 10:00 AM [facility] [phone]   02/21/2014 2:15 PM [facility] [phone]   02/21/2014 2:45 PM Amy Fredderick [facility] [phone]   03/07/2014 8:45 AM [facility] [phone]   03/07/2014 9:15 AM Amy Fredderick [facility] [phone]   03/08/2014 10:00 AM [facility] In[facility] Nurse [facility] [phone]   03/14/2014 1:45 PM [facility] [phone]   03/14/2014 2:15 PM Amy Fredderick [facility] [phone]   03/28/2014 8:45 AM [facility] [phone]   03/28/2014 9:15 AM Amy Fredderick [facility] [place] [facility] [phone]   04/04/2014 12:45 PM [facility] [phone]   04/04/2014 1:15 PM Amy Fredderick [facility] [phone]   Future Orders Complete By Expires   Call MD for:  redness, tenderness, or signs of infection (pain, swelling, bleeding, redness, odor or green/yellow discharge around incision site)  As directed    Call MD for:  severe or increased pain, loss or decreased feeling  in affected limb(s)  As directed    Discharge instructions  As directed    Comments:     Ok to shower am 01/11/14. Pat incision dry  JP to bulb suction, record q 12 hrs.  No strenuous activity, housework or lifting.  Compression garment all times.   Driving Restrictions  As directed    Comments:     No driving while taking narcotics   Lifting restrictions  As directed    Comments:     No lifting greater than 5-10 lbs   Resume previous diet  As directed        Medication List    STOP taking these medications       traMADol 50 MG tablet  Commonly known as:   ULTRAM      TAKE these medications       amoxicillin-clavulanate 875-125 MG per tablet  Commonly known as:  AUGMENTIN  Take 1 tablet by mouth 2 (two) times daily.     cyclobenzaprine 10 MG tablet  Commonly known as:  FLEXERIL  Take 10 mg by mouth 3 (three) times daily as needed for muscle spasms.     folic acid 1 MG tablet  Commonly known as:  FOLVITE  Take 1 mg by mouth daily.     oxyCODONE 5 MG immediate release tablet  Commonly known as:  ROXICODONE  Take 1-2 tablets (5-10 mg total) by mouth every 4 (four) hours as needed for severe pain.     oxyCODONE 5 MG immediate release tablet  Commonly known as:  ROXICODONE  Take 1 tablet (5 mg total) by mouth every 4 (four) hours as needed for severe pain.     VICTOZA 18 MG/3ML Sopn  Generic drug:  Liraglutide  Inject into the skin daily.     Vitamin D (Ergocalciferol) 50000 UNITS Caps capsule  Commonly known as:  DRISDOL  Take 50,000 Units by mouth every 7 (seven) days. Wednesdays           Follow-up Information   Follow up with Irene Limbo, MD On 01/12/2014. (945)    Specialty:  Plastic Surgery   Contact information:   [address] [place] [address] [phone]       Signed: Irene Limbo 01/11/2014, 7:23 AM

## 2014-01-11 NOTE — Progress Notes (Signed)
Discharge home. Home discharge instruction given, no questions verbalized. 

## 2014-01-11 NOTE — Patient Instructions (Signed)

## 2014-01-11 NOTE — Progress Notes (Signed)
HD#3, POD#1 drainage seroma R breast and exchange TE   Had nausea post op, received one 250 ml bolus for tachycardia Does not like her hand IV  JP 135  Temp:  [98 F (36.7 C)-99.1 F (37.3 C)] 98.9 F (37.2 C) (04/01 0521) Pulse Rate:  [98-138] 101 (04/01 0521) Resp:  [10-23] 16 (04/01 0521) BP: (95-112)/(52-84) 112/64 mmHg (04/01 0521) SpO2:  [97 %-100 %] 100 % (04/01 0521)  Chest with improved edema, and improved cellulitis though still come present medial lower breast   A/P Needs to hold her RA meds including methotrexate Continue drain, compression Has f/u for tomorrow D/c on Augmentin  Irene Limbo, MD [facility] Plastic & Reconstructive Surgery [phone]

## 2014-01-11 NOTE — Telephone Encounter (Signed)
Per staff message and POF I have scheduled appts.  JMW  

## 2014-01-11 NOTE — Progress Notes (Signed)
[place]  [phone] Fax:(336) [phone]     ID: Angela Gross OB: 20-Apr-1951  MR#: [id number]  CSN#:[id number]  PCP: Angela Gross., MD GYN:   SUFanny Skates OTHER MD: [name], Claire Sanger, Arnoldo Hooker [place]  CHIEF COMPLAINT:  Right Breast Cancer   HISTORY OF PRESENT ILLNESS: [name] had routine screening mammography at the breast center 09/21/2013 showing a possible mass in calcifications in the right breast. On 10/11/2013 she underwent right diagnostic mammography and ultrasonography. This confirmed a spiculated 1.2 cm mass in the central right breast, with a group of pleomorphic calcifications slightly laterally. The calcifications spanning 1.6 cm. A second group of calcifications extended inferiorly and spanned 8 mm. The entire area in aggregate measured 6.5 cm. By physical exam there was no palpable abnormality. Ultrasonography showed an irregular hypoechoic mass at the 6:00 position of the right breast measuring 1 cm maximally. The right axilla was normal.  Biopsy of the right breast mass in question as well as the more lateral area of calcifications on 10/11/2013 showed ([id number]) most to be positive for an invasive ductal carcinoma, both estrogen receptor 85-90% positive with strong staining intensity, both progesterone receptor negative, with an MIB-1 of 17%. HER-2 was amplified, with a signals ratio of 3.44 and a copy number per cell 04 0.30.  Bilateral breast MRI 10/19/2013 showed again a spiculated mass in the 6:00 region of the right breast measuring 1.2 cm, a slightly more lateral hematoma associated with a second biopsy and also with clumped enhancement, and asymmetric none masslike enhancement in most of the right breast, the entire area of abnormality measuring 9.0 cm. The left breast was unremarkable and there were no abnormal appearing lymph nodes.  Her subsequent history is as detailed below    INTERVAL HISTORY: Angela Gross  returns today accompanied by her daughter Angela Gross for followup of her right breast cancer. Interval history is remarkable for an unexpected hospitalization this week for cellulitis in the reconstructed breast.  She underwent surgery for seroma drainage and 2 exchange the tissue expander. She was started on IV antibiotics during her hospitalization, and was discharged this morning on Augmentin. She does have a drain in place. Her temperature is normal here in the office today, but she does continue to have pain in the right breast.  The plan had been to initiate her adjuvant chemotherapy on April 14, so she is here today to review that treatment plan and discuss her antinausea regimen.  Of course this may need to be delayed due to her recent surgery.   REVIEW OF SYSTEMS: Other than the redness in the reconstructed breast, Angela Gross denies any additional skin changes and has had no rashes. She's had no abnormal bruising or bleeding. Her energy level is low. She denies any nausea or emesis and is having regular bowel movements. She's had no dysuria or hematuria. She currently has no cough, phlegm production, increased shortness of breath, chest pain, palpitations. She does have a history of rheumatoid arthritis and has diffuse joint pain which is of course chronic. She denies any new pain, and specifically, she denies any abnormal headaches or dizziness.    A detailed review of systems today was otherwise stable  PAST MEDICAL HISTORY: Past Medical History  Diagnosis Date  . Raynaud disease   . Osteoporosis   . Hypertension   . PONV (postoperative nausea and vomiting)   . Wears glasses   . Full dentures   . Fibromyalgia   . Rheumatoid arthritis     "  elbows; fingers; toes"  . Osteoarthritis of both knees   . Degenerative disc disease, cervical   . Chronic left shoulder pain     "work related injury" (01/10/2014)  . Breast cancer     "right" (01/10/2014)  . Type II diabetes mellitus     has not taken  meds in over a month (01/10/2014)  . Syncope and collapse     "4 times in the last year" (01/10/2014)    PAST SURGICAL HISTORY: Past Surgical History  Procedure Laterality Date  . Colonoscopy    . Mastectomy w/ sentinel node biopsy Right 12/13/2013    Procedure: RIGHT TOTAL MASTECTOMY WITH SENTINEL LYMPH NODE BIOPSY;  Surgeon: Adin Hector, MD;  Location: [facility];  Service: General;  Laterality: Right;  . Portacath placement Right 12/13/2013    Procedure: INSERTION PORT-A-CATH;  Surgeon: Adin Hector, MD;  Location: [facility];  Service: General;  Laterality: Right;  . Breast reconstruction with placement of tissue expander and flex hd (acellular hydrated dermis) Right 12/13/2013    Procedure: RIGHT BREAST RECONSTRUCTION WITH PLACEMENT OF TISSUE EXPANDER AND FLEX HD (ACELLULAR HYDRATED DERMIS);  Surgeon: Irene Limbo, MD;  Location: [facility];  Service: Plastics;  Laterality: Right;  . Lipoma excision Bilateral 12/13/2013    Procedure: EXCISION OF LEFT BREAST KELOID AND RIGHT CHEST KELOID;  Surgeon: Irene Limbo, MD;  Location: [facility];  Service: Plastics;  Laterality: Bilateral;  . Mastectomy    . Cholecystectomy  1970's  . Vaginal hysterectomy  1980    no salpingo-oophoretomu  . Breast biopsy Right 10/2013  . Tissue expander placement Right 01/10/2014    Procedure: Incision and Drainage Right Breast Seroma with TISSUE EXPANDER exchange;  Surgeon: Irene Limbo, MD;  Location: [facility];  Service: Plastics;  Laterality: Right;    FAMILY HISTORY History reviewed. No pertinent family history. The patient is little information about her father. Her mother died from a heart attack at the age of 65. She had been diagnosed with breast cancer in her late 41s. The patient had 4 brothers and 4 sisters. There is no other history of breast or ovarian cancer in the family to her knowledge.  GYNECOLOGIC HISTORY:  She does not  recall her age of menarche. She underwent hysterectomy in her 54s. She did not receive hormone replacement. First live birth at 85. She was GX P2.  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 She is currently disabled secondary to her rheumatoid arthritis. Her husband Angela Gross is a retired Administrator. Son Angela Gross works in [place] as a Patent attorney. Daughter Angela Gross is a Social worker.    ADVANCED DIRECTIVES: Not in place   HEALTH MAINTENANCE: (Updated 01/11/2014) History  Substance Use Topics  . Smoking status: Never Smoker   . Smokeless tobacco: Never Used  . Alcohol Use: No     Colonoscopy: Not on file  PAP: Status post remote hysterectomy  Bone density: At [facility] 01/05/2009 showed osteopenia with a T score of -1.7  Lipid panel: Not on file   Allergies  Allergen Reactions  . Codeine Other (See Comments) and Hives    Altered mental status, numbness  Altered mental status, Numbess  . Diazepam     hallucinations     Current Outpatient Prescriptions  Medication Sig Dispense Refill  . amoxicillin-clavulanate (AUGMENTIN) 875-125 MG per  tablet Take 1 tablet by mouth 2 (two) times daily.  14 tablet  0  . cyclobenzaprine (FLEXERIL) 10 MG tablet Take 10 mg by mouth 3 (three) times daily as needed for muscle spasms.      . Liraglutide (VICTOZA) 18 MG/3ML SOPN Inject into the skin daily.      . mupirocin ointment (BACTROBAN) 2 % Place 1 application into the nose 2 (two) times daily.  22 g  0  . oxyCODONE (ROXICODONE) 5 MG immediate release tablet Take 1 tablet (5 mg total) by mouth every 4 (four) hours as needed for severe pain.  40 tablet  0  . traMADol (ULTRAM) 50 MG tablet       . Vitamin D, Ergocalciferol, (DRISDOL) 50000 UNITS CAPS capsule Take 50,000 Units by mouth every 7 (seven) days. Wednesdays      . dexamethasone (DECADRON) 4 MG tablet 2  tabs by mouth with food twice daily on day before and 3 days after each chemo  30 tablet  1  . folic acid (FOLVITE) 1 MG tablet Take 1 mg by mouth daily.      Marland Kitchen lidocaine-prilocaine (EMLA) cream Apply 1 application topically as needed. 1-2 hrs before port access  30 g  4  . LORazepam (ATIVAN) 0.5 MG tablet Take 0.5-1 tablets (0.25-0.5 mg total) by mouth at bedtime as needed for anxiety or sleep (or nausea).  30 tablet  0  . ondansetron (ZOFRAN) 8 MG tablet 1 tab by mouth twice daily x 3 days after each chemo, then 1 tab by mouth every 12 hrs if needed for nausea  30 tablet  1  . prochlorperazine (COMPAZINE) 10 MG tablet 1 tab by mouth with meals and at bedtime x 3 days after each chemo, then 1 tab by mouth every 6 hrs if needed for nausea  30 tablet  2   No current facility-administered medications for this visit.    OBJECTIVE: [name] who appears slightly uncomfortable but is in no acute distress Filed Vitals:   01/11/14 1053  BP: 108/74  Pulse: 95  Temp: 97.2 F (36.2 C)  Resp: 18     Body mass index is 29.8 kg/(m^2).    ECOG FS:2 - Symptomatic, <50% confined to bed Filed Weights   01/11/14 1053  Weight: 152 lb 9.6 oz (69.219 kg)   Physical Exam: HEENT:  Sclerae anicteric.  Oropharynx clear and moist. Neck supple, trachea midline.  NODES:  No cervical or supraclavicular lymphadenopathy palpated.  BREAST EXAM:  Patient status post right mastectomy with reconstruction. The tissue is still slightly erythematous, status post recent diagnosis of cellulitis. Drain is in place with serous sanguinous drainage noted, but no evidence of infection around the drain insertion site. Left breast is unremarkable. Axillae are benign bilaterally for palpable lymphadenopathy. LUNGS:  Clear to auscultation bilaterally.  No wheezes or rhonchi HEART:  Regular rate and rhythm. No murmur appreciated ABDOMEN:  Soft, nontender.  Positive bowel sounds.  MSK:  No focal spinal tenderness  to palpation. Limited range of motion in the right upper extremity today. EXTREMITIES:  No peripheral edema.   SKIN:  Benign with no additional rashes or skin changes. No excessive ecchymoses. No petechiae. NEURO:  Nonfocal. Well oriented.  Appropriate affect.    LAB RESULTS:    Lab Results  Component Value Date   WBC 23.4* 01/11/2014   NEUTROABS 17.2* 01/11/2014   HGB 10.1* 01/11/2014   HCT 31.3* 01/11/2014   MCV 82.0 01/11/2014  PLT 225 01/11/2014      Chemistry      Component Value Date/Time   NA 139 01/11/2014 1003   NA 138 01/09/2014 1522   K 4.1 01/11/2014 1003   K 4.2 01/09/2014 1522   CL 100 01/09/2014 1522   CO2 21* 01/11/2014 1003   CO2 25 01/09/2014 1522   BUN 7.0 01/11/2014 1003   BUN 11 01/09/2014 1522   CREATININE 0.7 01/11/2014 1003   CREATININE 0.99 01/09/2014 1522      Component Value Date/Time   CALCIUM 8.4 01/11/2014 1003   CALCIUM 8.5 01/09/2014 1522   ALKPHOS 71 01/11/2014 1003   ALKPHOS 102 12/09/2013 1400   AST 69* 01/11/2014 1003   AST 36 12/09/2013 1400   ALT 43 01/11/2014 1003   ALT 30 12/09/2013 1400   BILITOT 0.24 01/11/2014 1003   BILITOT 0.2* 12/09/2013 1400       STUDIES:  Echocardiogram on 12/20/2013 showed an ejection fraction of 55-60%.  DG CHEST PORT 1 VIEW 12/13/2013  CLINICAL DATA: Port-A-Cath placement.  EXAM:  PORTABLE CHEST - 1 VIEW  COMPARISON: 12/09/2013  FINDINGS:  There has been placement of a right subclavian Port-A-Cath which has  tip at the cavoatrial junction. No definite right pneumothorax.  There is a small caliber catheter projected over the right chest  with tip just right of midline at the level of the hilum which may  be related to patient's recent right breast surgery. Lungs are  somewhat hypoinflated with minimal prominence of the perihilar  markings suggesting minimal vascular congestion. There is subtle  opacification over the lateral left base likely atelectasis.  Cardiomediastinal silhouette is within normal. There is a small   right cervical rib. There are surgical clips over the right axilla.  Remainder of the exam is unchanged.  IMPRESSION:  Minimal prominence of the perihilar markings suggesting minimal  vascular congestion. Possible minimal left basilar atelectasis.  Right subclavian Port-A-Cath with tip at the region of the  cavoatrial junction. No definite pneumothorax.  Small caliber catheter over the right chest with tip just right of  midline as this may represent a surgical drain related to patient's  recent breast surgery.  Electronically Signed  By: Marin Olp M.D.  On: 12/13/2013 13:27    ASSESSMENT: 63 y.o. [place] woman s/p biopsy of separate Right breast masses 10/11/2013 for a clinical mT1 N0, stage IA invasive ductal carcinoma, grade II-III, one of the mass is being 85% estrogen receptor positive, progesterone receptor negative, with an MIB-1 of 17% and HER-2 amplified.  (1) status post right mastectomy and sentinel lymph node sampling with immediate expander placement 12/13/2013 for a pT1c pN0 invasive ductal carcinoma, grade 3, again HER-2 positive.  (2) To be treated in the adjuvant setting with carboplatin, docetaxel, trastuzumab and pertuzumab 01/24/2014. The plan is to repeat these agents every 21 days x6, after which the anti-HER-2 treatment (both trastuzumab and pertuzumab) will be continued to the total one year.  (3) the patient will not need postmastectomy radiation, so antiestrogen therapy will be started as soon as chemotherapy is completed  (4) rheumatoid arthritis, usually on Remicade, prednisone and methotrexate, held prior to mastectomy.  (5)  cellulitis affecting the right reconstructed breast diagnosed at 01/09/2014 with subsequent surgery to drain the seroma and exchange tissue expander.  PLAN: Over half of our 50 minute appointment today was spent answering the patient's questions and concerns, counseling her with regards to her treatment plan, reviewing possible  side effects, reviewing her antinausea  regimen, and coordinating care.  As noted above, we're hoping to be able to begin her chemotherapy on April 14. This may need to be delayed, depending on her recovery and healing after her unexpected surgery this week. She is scheduled to see the plastic surgeon again tomorrow for reevaluation.   When we initiate chemotherapy, she will be receiving docetaxel/carboplatin given with trastuzumab/pertuzumab, and the plan is to complete 6 q. three-week doses altogether, then continue the trastuzumab/pertuzumab for total of one year. We reviewed her antinausea regimen which will include: Dexamethasone (8 mg by mouth with food twice daily on the day before and 3 days after each cycle of chemotherapy); prochlorperazine (10 mg by mouth with meals and bedtime x3 days after chemotherapy, then one tablet by mouth every 6 hours if needed for nausea); lorazepam (0.25-0.5 mg at bedtime as needed for anxiety/insomnia/nausea); and ondansetron (8 mg by mouth twice daily x3 days after chemotherapy, then one tablet by mouth every 8 hours if needed for nausea). I will mention that she has a history of diabetes although it has been well controlled without medication for some time. She does have a good, and her on hand at home, and I encouraged her to follow her blood glucose closely over the next several weeks as we initiate chemotherapy and she begins taking oral dexamethasone. We may need to decrease her dose accordingly to keep her glucose under control.  We also reviewed the use of Claritin, 10 mg daily, for 7 days following the Neulasta injection to help decrease pain. She can also utilize Tylenol or Advil as needed. She is also given a prescription for the Emla cream for her port today.  Angela Gross was given a written copy of all the above instructions, and I also made a copy for her daughter lives in who accompanies her here today. They were able to voiced her understanding of how to  utilize all of these medications appropriately.  She scheduled to return next week on April 10 to see Dr. Jana Hakim and at that point they will decide whether or not she is going to be ready to proceed with chemotherapy as originally planned on April 14. If not, we will cancel her appointments for April 14 in April 15, and she will see me the following Monday on April 20 for reevaluation. We will then hope to initiate her first cycle of adjuvant chemotherapy on April 21. Once we make a final decision regarding her start date, we will make the necessary adjustments in her schedule.  This was all reviewed with the patient and her daughter, both of whom voice their understanding and agreement with the above plan. They both know to call with any changes or problems prior to her next appointment on April 10.   Angela Czerwinski, PA-C   01/11/2014 4:19 PM

## 2014-01-13 ENCOUNTER — Emergency Department (HOSPITAL_COMMUNITY)
Admission: EM | Admit: 2014-01-13 | Discharge: 2014-01-13 | Disposition: A | Discharge status: Home / Self Care | Payer: Medicare Other | Attending: Emergency Medicine | Admitting: Emergency Medicine

## 2014-01-13 ENCOUNTER — Emergency Department (HOSPITAL_COMMUNITY): Payer: Medicare Other

## 2014-01-13 ENCOUNTER — Encounter (HOSPITAL_COMMUNITY): Payer: Self-pay | Admitting: Emergency Medicine

## 2014-01-13 ENCOUNTER — Telehealth: Payer: Self-pay | Admitting: *Deleted

## 2014-01-13 DIAGNOSIS — IMO0001 Reserved for inherently not codable concepts without codable children: Secondary | ICD-10-CM | POA: Insufficient documentation

## 2014-01-13 DIAGNOSIS — M1710 Unilateral primary osteoarthritis, unspecified knee: Secondary | ICD-10-CM | POA: Insufficient documentation

## 2014-01-13 DIAGNOSIS — Z888 Allergy status to other drugs, medicaments and biological substances status: Secondary | ICD-10-CM | POA: Insufficient documentation

## 2014-01-13 DIAGNOSIS — M25519 Pain in unspecified shoulder: Secondary | ICD-10-CM | POA: Insufficient documentation

## 2014-01-13 DIAGNOSIS — R112 Nausea with vomiting, unspecified: Secondary | ICD-10-CM | POA: Insufficient documentation

## 2014-01-13 DIAGNOSIS — Z885 Allergy status to narcotic agent status: Secondary | ICD-10-CM | POA: Insufficient documentation

## 2014-01-13 DIAGNOSIS — M069 Rheumatoid arthritis, unspecified: Secondary | ICD-10-CM | POA: Insufficient documentation

## 2014-01-13 DIAGNOSIS — M5030 Other cervical disc degeneration, unspecified cervical region: Secondary | ICD-10-CM | POA: Insufficient documentation

## 2014-01-13 DIAGNOSIS — Z98811 Dental restoration status: Secondary | ICD-10-CM | POA: Insufficient documentation

## 2014-01-13 DIAGNOSIS — M810 Age-related osteoporosis without current pathological fracture: Secondary | ICD-10-CM | POA: Insufficient documentation

## 2014-01-13 DIAGNOSIS — M25476 Effusion, unspecified foot: Principal | ICD-10-CM | POA: Insufficient documentation

## 2014-01-13 DIAGNOSIS — I7300 Raynaud's syndrome without gangrene: Secondary | ICD-10-CM | POA: Insufficient documentation

## 2014-01-13 DIAGNOSIS — M79673 Pain in unspecified foot: Secondary | ICD-10-CM

## 2014-01-13 DIAGNOSIS — IMO0002 Reserved for concepts with insufficient information to code with codable children: Secondary | ICD-10-CM | POA: Insufficient documentation

## 2014-01-13 DIAGNOSIS — Z853 Personal history of malignant neoplasm of breast: Secondary | ICD-10-CM | POA: Insufficient documentation

## 2014-01-13 DIAGNOSIS — Z79899 Other long term (current) drug therapy: Secondary | ICD-10-CM | POA: Insufficient documentation

## 2014-01-13 DIAGNOSIS — Z789 Other specified health status: Secondary | ICD-10-CM | POA: Insufficient documentation

## 2014-01-13 DIAGNOSIS — E119 Type 2 diabetes mellitus without complications: Secondary | ICD-10-CM | POA: Insufficient documentation

## 2014-01-13 DIAGNOSIS — R55 Syncope and collapse: Secondary | ICD-10-CM | POA: Insufficient documentation

## 2014-01-13 DIAGNOSIS — M25473 Effusion, unspecified ankle: Secondary | ICD-10-CM | POA: Insufficient documentation

## 2014-01-13 MED ORDER — MELOXICAM 7.5 MG PO TABS: 7.5000 mg | ORAL_TABLET | Freq: Every day | ORAL | Status: DC

## 2014-01-13 NOTE — ED Provider Notes (Signed)
Medical screening examination/treatment/procedure(s) were performed by non-physician practitioner and as supervising physician I was immediately available for consultation/collaboration.  Trumaine Wimer L Zehava Turski, MD 01/13/14 1502 

## 2014-01-13 NOTE — Discharge Instructions (Signed)
Please read and follow all provided instructions.  Your diagnoses today include:  1. Foot pain     Tests performed today include:  An x-ray of the affected area - does NOT show any broken bones  Vital signs. See below for your results today.   Medications prescribed:   Meloxicam - anti-inflammatory pain medication  You have been prescribed an anti-inflammatory medication or NSAID. Take with food. Do not take aspirin, ibuprofen, or naproxen if taking this medication. Take smallest effective dose for the shortest duration needed for your pain. Stop taking if you experience stomach pain or vomiting.   Take any prescribed medications only as directed.  Home care instructions:   Follow any educational materials contained in this packet  Follow R.I.C.E. Protocol:  R - rest your injury   I  - use ice on injury without applying directly to skin  C - compress injury with bandage or splint  E - elevate the injury as much as possible  Follow-up instructions: Please follow-up with your primary care provider if you continue to have significant pain or trouble walking in 1 week.   Return instructions:   Please return if your toes are numb or tingling, appear gray or blue, or you have severe pain (also elevate leg and loosen splint or wrap if you were given one)  Please return to the Emergency Department if you experience worsening symptoms.   Please return if you have any other emergent concerns.  Additional Information:  Your vital signs today were: BP 132/79   Pulse 111   Temp(Src) 98.4 F (36.9 C) (Oral)   Resp 17   Ht 5' (1.524 m)   Wt 152 lb (68.947 kg)   BMI 29.69 kg/m2   SpO2 100% If your blood pressure (BP) was elevated above 135/85 this visit, please have this repeated by your doctor within one month. --------------

## 2014-01-13 NOTE — ED Notes (Signed)
Patient states she started having foot pain yesterday.  Patient denies injury.

## 2014-01-13 NOTE — Telephone Encounter (Signed)
Mailed after appt letter to pt. 

## 2014-01-13 NOTE — ED Provider Notes (Signed)
CSN: [id number]     Arrival date & time 01/13/14  1035 History   First MD Initiated Contact with Patient 01/13/14 1041   This chart was scribed for Earmon Phoenix, a non-physician practitioner working with Richarda Blade, MD by Denice Bors, ED Scribe. This patient was seen in room TR07C/TR07C and the patient's care was started at 11:17 AM    Chief Complaint  Patient presents with  . Foot Pain    (Consider location/radiation/quality/duration/timing/severity/associated sxs/prior Treatment) The history is provided by the patient. No language interpreter was used.   HPI Comments: Angela Gross is a 63 y.o. female who presents to the Emergency Department with PMHx of rheumatoid arthritis complaining of constant left foot pain onset noticed yesterday while walking. States pain is on the dorsal and plantar aspect. Denies injury. Describes pain as moderate in severity. Reports trying heating pad with no relief of symptoms. Reports pain is exacerbated by touch and movement. Denies associated recent injuries trauma, fever, numbness, and weakness.   Past Medical History  Diagnosis Date  . Raynaud disease   . Osteoporosis   . Hypertension   . PONV (postoperative nausea and vomiting)   . Wears glasses   . Full dentures   . Fibromyalgia   . Rheumatoid arthritis     "elbows; fingers; toes"  . Osteoarthritis of both knees   . Degenerative disc disease, cervical   . Chronic left shoulder pain     "work related injury" (01/10/2014)  . Breast cancer     "right" (01/10/2014)  . Type II diabetes mellitus     has not taken meds in over a month (01/10/2014)  . Syncope and collapse     "4 times in the last year" (01/10/2014)   Past Surgical History  Procedure Laterality Date  . Colonoscopy    . Mastectomy w/ sentinel node biopsy Right 12/13/2013    Procedure: RIGHT TOTAL MASTECTOMY WITH SENTINEL LYMPH NODE BIOPSY;  Surgeon: Adin Hector, MD;  Location: [place];  Service:  General;  Laterality: Right;  . Portacath placement Right 12/13/2013    Procedure: INSERTION PORT-A-CATH;  Surgeon: Adin Hector, MD;  Location: [place];  Service: General;  Laterality: Right;  . Breast reconstruction with placement of tissue expander and flex hd (acellular hydrated dermis) Right 12/13/2013    Procedure: RIGHT BREAST RECONSTRUCTION WITH PLACEMENT OF TISSUE EXPANDER AND FLEX HD (ACELLULAR HYDRATED DERMIS);  Surgeon: Irene Limbo, MD;  Location: [place];  Service: Plastics;  Laterality: Right;  . Lipoma excision Bilateral 12/13/2013    Procedure: EXCISION OF LEFT BREAST KELOID AND RIGHT CHEST KELOID;  Surgeon: Irene Limbo, MD;  Location: [place];  Service: Plastics;  Laterality: Bilateral;  . Mastectomy    . Cholecystectomy  1970's  . Vaginal hysterectomy  1980    no salpingo-oophoretomu  . Breast biopsy Right 10/2013  . Tissue expander placement Right 01/10/2014    Procedure: Incision and Drainage Right Breast Seroma with TISSUE EXPANDER exchange;  Surgeon: Irene Limbo, MD;  Location: [place];  Service: Plastics;  Laterality: Right;   No family history on file. History  Substance Use Topics  . Smoking status: Never Smoker   . Smokeless tobacco: Never Used  . Alcohol Use: No   OB History   Grav Para Term Preterm Abortions TAB SAB Ect Mult Living                 Review of Systems  Constitutional: Negative for fever and activity change.  Musculoskeletal: Positive for arthralgias (left foot pain ). Negative for back pain, joint swelling and neck pain.  Skin: Negative for wound.  Neurological: Negative for weakness and numbness.  Psychiatric/Behavioral: Negative for confusion.      Allergies  Codeine and Diazepam  Home Medications   Current Outpatient Rx  Name  Route  Sig  Dispense  Refill  . cyclobenzaprine (FLEXERIL) 10 MG tablet   Oral   Take 10 mg by mouth 3 (three) times daily as needed for  muscle spasms.         Marland Kitchen lidocaine-prilocaine (EMLA) cream   Topical   Apply 1 application topically as needed. 1-2 hrs before port access   30 g   4   . Liraglutide (VICTOZA) 18 MG/3ML SOPN   Subcutaneous   Inject 0.6 mg into the skin daily.          . mupirocin ointment (BACTROBAN) 2 %   Nasal   Place 1 application into the nose 2 (two) times daily.   22 g   0   . oxyCODONE (ROXICODONE) 5 MG immediate release tablet   Oral   Take 1 tablet (5 mg total) by mouth every 4 (four) hours as needed for severe pain.   40 tablet   0   . traMADol (ULTRAM) 50 MG tablet   Oral   Take 50 mg by mouth 3 (three) times daily as needed (pain).          Marland Kitchen dexamethasone (DECADRON) 4 MG tablet      2 tabs by mouth with food twice daily on day before and 3 days after each chemo   30 tablet   1   . LORazepam (ATIVAN) 0.5 MG tablet   Oral   Take 0.5-1 tablets (0.25-0.5 mg total) by mouth at bedtime as needed for anxiety or sleep (or nausea).   30 tablet   0   . ondansetron (ZOFRAN) 8 MG tablet      1 tab by mouth twice daily x 3 days after each chemo, then 1 tab by mouth every 12 hrs if needed for nausea   30 tablet   1   . prochlorperazine (COMPAZINE) 10 MG tablet      1 tab by mouth with meals and at bedtime x 3 days after each chemo, then 1 tab by mouth every 6 hrs if needed for nausea   30 tablet   2   . Vitamin D, Ergocalciferol, (DRISDOL) 50000 UNITS CAPS capsule   Oral   Take 50,000 Units by mouth every 7 (seven) days. Wednesdays          BP 132/79  Pulse 111  Temp(Src) 98.4 F (36.9 C) (Oral)  Resp 17  Ht 5' (1.524 m)  Wt 152 lb (68.947 kg)  BMI 29.69 kg/m2  SpO2 100% Physical Exam  Nursing note and vitals reviewed. Constitutional: She appears well-developed and well-nourished. No distress.  HENT:  Head: Normocephalic and atraumatic.  Eyes: EOM are normal. Pupils are equal, round, and reactive to light.  Neck: Normal range of motion. Neck supple. No  tracheal deviation present.  Cardiovascular: Normal rate, intact distal pulses and normal pulses.   Pulses:      Dorsalis pedis pulses are 2+ on the left side.       Posterior tibial pulses are 2+ on the left side.  Cap refill < 2 seconds   Pulmonary/Chest: Effort normal. No respiratory distress.  Musculoskeletal: Normal range of motion. She exhibits tenderness. She exhibits no edema.  Left foot:  TTP over medial and lateral malleoli of left foot. TTP of plantar aspect and metatarsals. No erythema or swelling noted.   Neurological: She is alert. No sensory deficit.  Sensation intact   Skin: Skin is warm and dry. No erythema.  Psychiatric: She has a normal mood and affect. Her behavior is normal.    ED Course  Procedures (including critical care time) COORDINATION OF CARE:  Nursing notes reviewed. Vital signs reviewed. Initial pt interview and examination performed.   Filed Vitals:   01/13/14 1040  BP: 132/79  Pulse: 111  Temp: 98.4 F (36.9 C)  TempSrc: Oral  Resp: 17  Height: 5' (1.524 m)  Weight: 152 lb (68.947 kg)  SpO2: 100%    10:59 AM-Discussed work up plan with pt at bedside, which includes  Orders Placed This Encounter  Procedures  . DG Foot Complete Left    Standing Status: Standing     Number of Occurrences: 1     Standing Expiration Date:     Order Specific Question:  Reason for exam:    Answer:  FOOT PAIN  . Pt agrees with plan.    Initial diagnostic testing ordered.      Labs Review Labs Reviewed - No data to display Imaging Review Dg Foot Complete Left  01/13/2014   CLINICAL DATA:  Twisting injury.  EXAM: LEFT FOOT - COMPLETE 3+ VIEW  COMPARISON:  None.  FINDINGS: The joint spaces are maintained. No acute fractures identified. No arthropathic findings. There is a flexion deformity noted at the PIP joint of the second toe.  IMPRESSION: No acute bony findings.   Electronically Signed   By: Kalman Jewels M.D.   On: 01/13/2014 11:18  11:30 AM  Nursing Notes Reviewed/ Care Coordinated Applicable Imaging Reviewed and incorporated into ED treatment Discussed results and treatment plan with pt. Pt demonstrates understanding and agrees with plan.    EKG Interpretation None      Vital signs reviewed and are as follows: Filed Vitals:   01/13/14 1040  BP: 132/79  Pulse: 111  Temp: 98.4 F (36.9 C)  Resp: 17   Patient provided with post-op shoe. NSAIDs indicated with f/u if not improved.    MDM   Final diagnoses:  Foot pain   Patient with foot pain, unclear etiology. She does have a h/o RA but no swelling or erythema. Do not suspect joint infection. NSAIDs indicated with f/u if not improved. Foot is neurovascularly intact. She has narcotic pain meds at home from previous surgery.   I personally performed the services described in this documentation, which was scribed in my presence. The recorded information has been reviewed and is accurate.    Carlisle Cater, PA-C 01/13/14 1157

## 2014-01-20 ENCOUNTER — Ambulatory Visit (HOSPITAL_BASED_OUTPATIENT_CLINIC_OR_DEPARTMENT_OTHER): Payer: Medicare Other | Admitting: Oncology

## 2014-01-20 ENCOUNTER — Telehealth: Payer: Self-pay | Admitting: Oncology

## 2014-01-20 ENCOUNTER — Other Ambulatory Visit (HOSPITAL_BASED_OUTPATIENT_CLINIC_OR_DEPARTMENT_OTHER): Payer: Medicare Other

## 2014-01-20 VITALS — BP 138/91 | HR 87 | Temp 98.1°F | Resp 20 | Ht 60.0 in | Wt 145.5 lb

## 2014-01-20 DIAGNOSIS — Z170 Estrogen receptor positive status [ER+]: Secondary | ICD-10-CM

## 2014-01-20 DIAGNOSIS — C50319 Malignant neoplasm of lower-inner quadrant of unspecified female breast: Secondary | ICD-10-CM

## 2014-01-20 DIAGNOSIS — E119 Type 2 diabetes mellitus without complications: Secondary | ICD-10-CM

## 2014-01-20 DIAGNOSIS — C50311 Malignant neoplasm of lower-inner quadrant of right female breast: Secondary | ICD-10-CM

## 2014-01-20 DIAGNOSIS — M2550 Pain in unspecified joint: Secondary | ICD-10-CM

## 2014-01-20 DIAGNOSIS — N61 Mastitis without abscess: Secondary | ICD-10-CM

## 2014-01-20 LAB — COMPREHENSIVE METABOLIC PANEL (CC13)
ALK PHOS: 75 U/L (ref 40–150)
ALT: 20 U/L (ref 0–55)
AST: 21 U/L (ref 5–34)
Albumin: 3 g/dL — ABNORMAL LOW (ref 3.5–5.0)
Anion Gap: 9 mEq/L (ref 3–11)
BUN: 12 mg/dL (ref 7.0–26.0)
CHLORIDE: 107 meq/L (ref 98–109)
CO2: 27 mEq/L (ref 22–29)
Calcium: 9.4 mg/dL (ref 8.4–10.4)
Creatinine: 0.8 mg/dL (ref 0.6–1.1)
Glucose: 111 mg/dl (ref 70–140)
Potassium: 4.2 mEq/L (ref 3.5–5.1)
Sodium: 143 mEq/L (ref 136–145)
Total Bilirubin: 0.2 mg/dL (ref 0.20–1.20)
Total Protein: 8 g/dL (ref 6.4–8.3)

## 2014-01-20 LAB — CBC WITH DIFFERENTIAL/PLATELET
BASO%: 0.4 % (ref 0.0–2.0)
Basophils Absolute: 0 10*3/uL (ref 0.0–0.1)
EOS%: 2 % (ref 0.0–7.0)
Eosinophils Absolute: 0.3 10*3/uL (ref 0.0–0.5)
HEMATOCRIT: 34.7 % — AB (ref 34.8–46.6)
HGB: 11.1 g/dL — ABNORMAL LOW (ref 11.6–15.9)
LYMPH#: 2.4 10*3/uL (ref 0.9–3.3)
LYMPH%: 19 % (ref 14.0–49.7)
MCH: 26.6 pg (ref 25.1–34.0)
MCHC: 31.9 g/dL (ref 31.5–36.0)
MCV: 83.4 fL (ref 79.5–101.0)
MONO#: 0.6 10*3/uL (ref 0.1–0.9)
MONO%: 4.4 % (ref 0.0–14.0)
NEUT#: 9.5 10*3/uL — ABNORMAL HIGH (ref 1.5–6.5)
NEUT%: 74.2 % (ref 38.4–76.8)
Platelets: 292 10*3/uL (ref 145–400)
RBC: 4.16 10*6/uL (ref 3.70–5.45)
RDW: 14.6 % — ABNORMAL HIGH (ref 11.2–14.5)
WBC: 12.8 10*3/uL — ABNORMAL HIGH (ref 3.9–10.3)

## 2014-01-20 NOTE — Telephone Encounter (Signed)
, °

## 2014-01-20 NOTE — Progress Notes (Signed)
[facility]  Telephone:([phone] Fax:([phone]) [phone]     ID: Angela Gross OB: [birth year]  MR#: [id number]  CSN#:[id number]  PCP: Salena Saner., MD GYN:   SUFanny Gross OTHER MD: [name], Claire Sanger, Arnoldo Hooker [place]  CHIEF COMPLAINT:  Right Breast Cancer   HISTORY OF PRESENT ILLNESS: Angela Gross had routine screening mammography at the breast center 09/21/2013 showing a possible mass in calcifications in the right breast. On 10/11/2013 she underwent right diagnostic mammography and ultrasonography. This confirmed a spiculated 1.2 cm mass in the central right breast, with a group of pleomorphic calcifications slightly laterally. The calcifications spanning 1.6 cm. A second group of calcifications extended inferiorly and spanned 8 mm. The entire area in aggregate measured 6.5 cm. By physical exam there was no palpable abnormality. Ultrasonography showed an irregular hypoechoic mass at the 6:00 position of the right breast measuring 1 cm maximally. The right axilla was normal.  Biopsy of the right breast mass in question as well as the more lateral area of calcifications on 10/11/2013 showed ([id number]) most to be positive for an invasive ductal carcinoma, both estrogen receptor 85-90% positive with strong staining intensity, both progesterone receptor negative, with an MIB-1 of 17%. HER-2 was amplified, with a signals ratio of 3.44 and a copy number per cell 04 0.30.  Bilateral breast MRI 10/19/2013 showed again a spiculated mass in the 6:00 region of the right breast measuring 1.2 cm, a slightly more lateral hematoma associated with a second biopsy and also with clumped enhancement, and asymmetric none masslike enhancement in most of the right breast, the entire area of abnormality measuring 9.0 cm. The left breast was unremarkable and there were no abnormal appearing lymph nodes.  Her subsequent history is as detailed below    INTERVAL HISTORY: Angela Gross  returns today accompanied by her husband Angela Gross for followup of her right breast cancer. She continues to work closely with plastics and she expects to have her drain removed early next week. We're not quite ready to start chemotherapy on the 14th however. The new target date will be 01/31/2049  REVIEW OF SYSTEMS: Aside from the problems with cellulitis and drainage in the right breast, she somehow twisted her ankle and couldn't walk. She was evaluated, there was no fracture by films, and she wore a boot for a couple of days. That completed the care of the problem. Aside from that, she has a little bit of discomfort in the lower left back, which is intermittent but can be bothersome. This does not keep her from walking, which is the only exercise she is allowed at this point. She is done with the expander filling. As noted she is scheduled to remove the drain 01/24/2014. She is having mild hot flashes, which is baseline. A detailed review of systems today was otherwise noncontributory  PAST MEDICAL HISTORY: Past Medical History  Diagnosis Date  . Raynaud disease   . Osteoporosis   . Hypertension   . PONV (postoperative nausea and vomiting)   . Wears glasses   . Full dentures   . Fibromyalgia   . Rheumatoid arthritis     "elbows; fingers; toes"  . Osteoarthritis of both knees   . Degenerative disc disease, cervical   . Chronic left shoulder pain     "work related injury" (01/10/2014)  . Breast cancer     "right" (01/10/2014)  . Type II diabetes mellitus     has not taken meds in over a month (01/10/2014)  .  Syncope and collapse     "4 times in the last year" (01/10/2014)    PAST SURGICAL HISTORY: Past Surgical History  Procedure Laterality Date  . Colonoscopy    . Mastectomy w/ sentinel node biopsy Right 12/13/2013    Procedure: RIGHT TOTAL MASTECTOMY WITH SENTINEL LYMPH NODE BIOPSY;  Surgeon: Adin Hector, MD;  Location: [place];  Service: General;  Laterality: Right;   . Portacath placement Right 12/13/2013    Procedure: INSERTION PORT-A-CATH;  Surgeon: Adin Hector, MD;  Location: [place];  Service: General;  Laterality: Right;  . Breast reconstruction with placement of tissue expander and flex hd (acellular hydrated dermis) Right 12/13/2013    Procedure: RIGHT BREAST RECONSTRUCTION WITH PLACEMENT OF TISSUE EXPANDER AND FLEX HD (ACELLULAR HYDRATED DERMIS);  Surgeon: Irene Limbo, MD;  Location: [place];  Service: Plastics;  Laterality: Right;  . Lipoma excision Bilateral 12/13/2013    Procedure: EXCISION OF LEFT BREAST KELOID AND RIGHT CHEST KELOID;  Surgeon: Irene Limbo, MD;  Location: [place];  Service: Plastics;  Laterality: Bilateral;  . Mastectomy    . Cholecystectomy  1970's  . Vaginal hysterectomy  1980    no salpingo-oophoretomu  . Breast biopsy Right 10/2013  . Tissue expander placement Right 01/10/2014    Procedure: Incision and Drainage Right Breast Seroma with TISSUE EXPANDER exchange;  Surgeon: Irene Limbo, MD;  Location: [place];  Service: Plastics;  Laterality: Right;    FAMILY HISTORY No family history on file. The patient is little information about her father. Her mother died from a heart attack at the age of 38. She had been diagnosed with breast cancer in her late 36s. The patient had 4 brothers and 4 sisters. There is no other history of breast or ovarian cancer in the family to her knowledge.  GYNECOLOGIC HISTORY:  She does not recall her age of menarche. She underwent hysterectomy in her 10s. She did not receive hormone replacement. First live birth at 43. She was GX P2.  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 She is currently disabled secondary to her rheumatoid arthritis. Her husband Angela Gross is a retired Administrator. He now works part time at [employer]. Son Angela Gross works  in [place] as a Patent attorney. Daughter Angela Gross is a Social worker.    ADVANCED DIRECTIVES: Not in place   HEALTH MAINTENANCE: (Updated 01/11/2014) History  Substance Use Topics  . Smoking status: Never Smoker   . Smokeless tobacco: Never Used  . Alcohol Use: No     Colonoscopy: Not on file  PAP: Status post remote hysterectomy  Bone density: At [facility] hospital 01/05/2009 showed osteopenia with a T score of -1.7  Lipid panel: Not on file   Allergies  Allergen Reactions  . Codeine Other (See Comments) and Hives    Altered mental status, numbness  Altered mental status, Numbess  . Diazepam     hallucinations     Current Outpatient Prescriptions  Medication Sig Dispense Refill  . cyclobenzaprine (FLEXERIL) 10 MG tablet Take 10 mg by mouth 3 (three) times daily as needed for muscle spasms.      Marland Kitchen dexamethasone (DECADRON) 4 MG tablet 2 tabs by mouth with food twice daily on day before and 3 days after each chemo  30 tablet  1  . lidocaine-prilocaine (EMLA) cream Apply 1 application topically  as needed. 1-2 hrs before port access  30 g  4  . Liraglutide (VICTOZA) 18 MG/3ML SOPN Inject 0.6 mg into the skin daily.       Marland Kitchen LORazepam (ATIVAN) 0.5 MG tablet Take 0.5-1 tablets (0.25-0.5 mg total) by mouth at bedtime as needed for anxiety or sleep (or nausea).  30 tablet  0  . meloxicam (MOBIC) 7.5 MG tablet Take 1 tablet (7.5 mg total) by mouth daily.  10 tablet  0  . mupirocin ointment (BACTROBAN) 2 % Place 1 application into the nose 2 (two) times daily.  22 g  0  . ondansetron (ZOFRAN) 8 MG tablet Take 8 mg by mouth See admin instructions. 1 tab by mouth twice daily x 3 days after each chemo, then 1 tab by mouth every 12 hrs if needed for nausea      . oxyCODONE (ROXICODONE) 5 MG immediate release tablet Take 1 tablet (5 mg total) by mouth every 4 (four) hours as needed for severe pain.  40 tablet  0  . prochlorperazine (COMPAZINE) 10 MG tablet Take 10 mg  by mouth once. 1 tab by mouth with meals and at bedtime x 3 days after each chemo, then 1 tab by mouth every 6 hrs if needed for nausea      . traMADol (ULTRAM) 50 MG tablet Take 50 mg by mouth 3 (three) times daily as needed (pain).       . Vitamin D, Ergocalciferol, (DRISDOL) 50000 UNITS CAPS capsule Take 50,000 Units by mouth every 7 (seven) days. Wednesdays       No current facility-administered medications for this visit.    OBJECTIVE:  Filed Vitals:   01/20/14 0915  BP: 138/91  Pulse: 87  Temp: 98.1 F (36.7 C)  Resp: 20     Body mass index is 28.42 kg/(m^2).    ECOG FS:2 - Symptomatic, <50% confined to bed Filed Weights   01/20/14 0915  Weight: 145 lb 8 oz (65.998 kg)   [name] who appears stated age Sclerae unicteric, pupils equal and round Oropharynx clear and moist No cervical or supraclavicular adenopathy Lungs no rales or rhonchi Heart regular rate and rhythm Abd soft, nontender, positive bowel sounds MSK no focal spinal tenderness, no upper extremity lymphedema, no masses or tenderness in the left lower back where she occasionally has pain Neuro: nonfocal, well oriented, positive affect Breasts: The right breast is status post mastectomy with expander in place. There is a drain, with scant serous sanguinous fluid in the bulb. There is no evidence of cellulitis or inflammation. The right axilla is benign her left breast is unremarkable   LAB RESULTS:    Lab Results  Component Value Date   WBC 12.8* 01/20/2014   NEUTROABS 9.5* 01/20/2014   HGB 11.1* 01/20/2014   HCT 34.7* 01/20/2014   MCV 83.4 01/20/2014   PLT 292 01/20/2014      Chemistry      Component Value Date/Time   NA 139 01/11/2014 1003   NA 138 01/09/2014 1522   K 4.1 01/11/2014 1003   K 4.2 01/09/2014 1522   CL 100 01/09/2014 1522   CO2 21* 01/11/2014 1003   CO2 25 01/09/2014 1522   BUN 7.0 01/11/2014 1003   BUN 11 01/09/2014 1522   CREATININE 0.7 01/11/2014 1003   CREATININE 0.99  01/09/2014 1522      Component Value Date/Time   CALCIUM 8.4 01/11/2014 1003   CALCIUM 8.5 01/09/2014 1522   ALKPHOS  71 01/11/2014 1003   ALKPHOS 102 12/09/2013 1400   AST 69* 01/11/2014 1003   AST 36 12/09/2013 1400   ALT 43 01/11/2014 1003   ALT 30 12/09/2013 1400   BILITOT 0.24 01/11/2014 1003   BILITOT 0.2* 12/09/2013 1400       STUDIES: Dg Foot Complete Left  01/13/2014   CLINICAL DATA:  Twisting injury.  EXAM: LEFT FOOT - COMPLETE 3+ VIEW  COMPARISON:  None.  FINDINGS: The joint spaces are maintained. No acute fractures identified. No arthropathic findings. There is a flexion deformity noted at the PIP joint of the second toe.  IMPRESSION: No acute bony findings.   Electronically Signed   By: Kalman Jewels M.D.   On: 01/13/2014 11:18    ASSESSMENT: 63 y.o. [place] woman s/p biopsy of separate Right breast masses 10/11/2013 for a clinical mT1 N0, stage IA invasive ductal carcinoma, grade II-III, one of the mass is being 85% estrogen receptor positive, progesterone receptor negative, with an MIB-1 of 17% and HER-2 amplified.  (1) status post right mastectomy and sentinel lymph node sampling with immediate expander placement 12/13/2013 for a pT1c pN0 invasive ductal carcinoma, grade 3, again HER-2 positive.  (2) To be treated in the adjuvant setting with carboplatin, docetaxel, trastuzumab and pertuzumab 01/24/2014. The plan is to repeat these agents every 21 days x6, after which the anti-HER-2 treatment (both trastuzumab and pertuzumab) will be continued to the total one year.  (3) the patient will not need postmastectomy radiation, so antiestrogen therapy will be started as soon as chemotherapy is completed  (4) rheumatoid arthritis, usually on Remicade, prednisone and methotrexate, held prior to mastectomy.  (5)  cellulitis affecting the right reconstructed breast diagnosed at 01/09/2014 with subsequent surgery to drain the seroma and exchange tissue expander.  PLAN: Angela Gross is finally  getting ready to start treatment and the start date will be 01/31/2014. She has a good understanding of the possible toxicities, side effects and complications of her antibody and chemotherapy agents. She also understands the potential benefits. She agrees with the overall plan. She will see [place] again in April 20, just to make sure nothing is in place. She knows to call for any problems that may develop before her next visit here.   Angela Cruel, MD   01/20/2014 9:38 AM

## 2014-01-23 NOTE — Addendum Note (Signed)
Addended by: Laureen Abrahams on: 01/23/2014 06:01 PM   Modules accepted: Medications

## 2014-01-24 ENCOUNTER — Ambulatory Visit: Payer: Medicare Other

## 2014-01-25 ENCOUNTER — Ambulatory Visit: Payer: Medicare Other

## 2014-01-26 ENCOUNTER — Other Ambulatory Visit: Payer: Self-pay | Admitting: Physician Assistant

## 2014-01-26 ENCOUNTER — Telehealth: Payer: Self-pay | Admitting: Physician Assistant

## 2014-01-26 NOTE — Telephone Encounter (Signed)
, °

## 2014-01-27 ENCOUNTER — Telehealth: Payer: Self-pay | Admitting: *Deleted

## 2014-01-27 NOTE — Telephone Encounter (Signed)
Per staff message and POF I have scheduled appts.  JMW  

## 2014-01-27 NOTE — Telephone Encounter (Signed)
Pt left message at 350pm stating " I am confused if I am supposed to be taking any medications this weekend, this calender has me all confused ".  Return call number given as [phone].  This RN returned call and obtained VM- general message left requesting a return call.

## 2014-01-30 ENCOUNTER — Encounter: Payer: Self-pay | Admitting: Physician Assistant

## 2014-01-30 ENCOUNTER — Other Ambulatory Visit (HOSPITAL_BASED_OUTPATIENT_CLINIC_OR_DEPARTMENT_OTHER): Payer: Medicare Other

## 2014-01-30 ENCOUNTER — Encounter (INDEPENDENT_AMBULATORY_CARE_PROVIDER_SITE_OTHER): Payer: Self-pay

## 2014-01-30 ENCOUNTER — Ambulatory Visit (HOSPITAL_BASED_OUTPATIENT_CLINIC_OR_DEPARTMENT_OTHER): Payer: Medicare Other | Admitting: Physician Assistant

## 2014-01-30 ENCOUNTER — Other Ambulatory Visit: Payer: Self-pay | Admitting: Physician Assistant

## 2014-01-30 ENCOUNTER — Telehealth: Payer: Self-pay | Admitting: Oncology

## 2014-01-30 VITALS — BP 126/75 | HR 86 | Temp 98.9°F | Resp 18 | Ht 60.0 in | Wt 144.1 lb

## 2014-01-30 DIAGNOSIS — C50311 Malignant neoplasm of lower-inner quadrant of right female breast: Secondary | ICD-10-CM

## 2014-01-30 DIAGNOSIS — F411 Generalized anxiety disorder: Secondary | ICD-10-CM

## 2014-01-30 DIAGNOSIS — M2550 Pain in unspecified joint: Secondary | ICD-10-CM

## 2014-01-30 DIAGNOSIS — N61 Mastitis without abscess: Secondary | ICD-10-CM

## 2014-01-30 DIAGNOSIS — C50919 Malignant neoplasm of unspecified site of unspecified female breast: Secondary | ICD-10-CM

## 2014-01-30 DIAGNOSIS — M069 Rheumatoid arthritis, unspecified: Secondary | ICD-10-CM

## 2014-01-30 DIAGNOSIS — E119 Type 2 diabetes mellitus without complications: Secondary | ICD-10-CM

## 2014-01-30 DIAGNOSIS — F419 Anxiety disorder, unspecified: Secondary | ICD-10-CM | POA: Insufficient documentation

## 2014-01-30 LAB — COMPREHENSIVE METABOLIC PANEL (CC13)
ALBUMIN: 3.3 g/dL — AB (ref 3.5–5.0)
ALT: 11 U/L (ref 0–55)
ANION GAP: 7 meq/L (ref 3–11)
AST: 17 U/L (ref 5–34)
Alkaline Phosphatase: 99 U/L (ref 40–150)
BUN: 8.7 mg/dL (ref 7.0–26.0)
CO2: 26 meq/L (ref 22–29)
CREATININE: 0.8 mg/dL (ref 0.6–1.1)
Calcium: 9.6 mg/dL (ref 8.4–10.4)
Chloride: 108 mEq/L (ref 98–109)
GLUCOSE: 112 mg/dL (ref 70–140)
Potassium: 4.2 mEq/L (ref 3.5–5.1)
Sodium: 142 mEq/L (ref 136–145)
Total Bilirubin: 0.27 mg/dL (ref 0.20–1.20)
Total Protein: 7.8 g/dL (ref 6.4–8.3)

## 2014-01-30 LAB — CBC WITH DIFFERENTIAL/PLATELET
BASO%: 0.3 % (ref 0.0–2.0)
BASOS ABS: 0 10*3/uL (ref 0.0–0.1)
EOS ABS: 0.2 10*3/uL (ref 0.0–0.5)
EOS%: 2.6 % (ref 0.0–7.0)
HCT: 37.6 % (ref 34.8–46.6)
HEMOGLOBIN: 12 g/dL (ref 11.6–15.9)
LYMPH#: 1.4 10*3/uL (ref 0.9–3.3)
LYMPH%: 19.3 % (ref 14.0–49.7)
MCH: 26.7 pg (ref 25.1–34.0)
MCHC: 31.9 g/dL (ref 31.5–36.0)
MCV: 83.7 fL (ref 79.5–101.0)
MONO#: 0.6 10*3/uL (ref 0.1–0.9)
MONO%: 8.4 % (ref 0.0–14.0)
NEUT#: 4.9 10*3/uL (ref 1.5–6.5)
NEUT%: 69.4 % (ref 38.4–76.8)
Platelets: 201 10*3/uL (ref 145–400)
RBC: 4.49 10*6/uL (ref 3.70–5.45)
RDW: 14.7 % — ABNORMAL HIGH (ref 11.2–14.5)
WBC: 7 10*3/uL (ref 3.9–10.3)
nRBC: 0 % (ref 0–0)

## 2014-01-30 NOTE — Progress Notes (Signed)
I received a message from Dr. Iran Planas this afternoon after she saw Angela Gross today in her office. She still has an area in the right breast incision which has not completely healed. Apparently Dr. Iran Planas withdrew fluid from the expander and closed up this area. The patient was started on antibiotics, and will be reassessed in approximately one week.  To allow further healing, Dr. Iran Planas suggests holding off on chemotherapy. I have reviewed this case with Dr. Jana Hakim, and we concur. Dr. Virgie Dad suggestion is to give the patient an additional 3 weeks for healing, and plan to initiate her first cycle of adjuvant chemotherapy on May 12.  I have tried to contact the patient several times this afternoon on both her mobile and home phone numbers. I have left generalized messages asking her to contact us at [phone], but informed her that we will need to cancel her appointment tomorrow. We will give her further information once we are able to get in touch with her, but basically we will be canceling her appointments on April 21, 22nd, and 29th. She's been scheduled to return for labs, followup visit, and chemotherapy on May 12.  [name], PA-C 01/30/2014

## 2014-01-30 NOTE — Telephone Encounter (Signed)
per AB no new pof sent pt just needs copy of schedule. pt given AVS report.

## 2014-01-30 NOTE — Progress Notes (Signed)
[facility]  [phone] [phone]     ID: Angela Gross OB: [birth year]  MR#: [id number]  JYN#:[id number]  PCP: Salena Saner., MD GYN:   SUFanny Skates OTHER MD: [name], Lyndee Leo Sanger, Arnoldo Hooker Thimmappa  CHIEF COMPLAINT:  Right Breast Cancer   HISTORY OF PRESENT ILLNESS: Tocara had routine screening mammography at the breast center 09/21/2013 showing a possible mass in calcifications in the right breast. On 10/11/2013 she underwent right diagnostic mammography and ultrasonography. This confirmed a spiculated 1.2 cm mass in the central right breast, with a group of pleomorphic calcifications slightly laterally. The calcifications spanning 1.6 cm. A second group of calcifications extended inferiorly and spanned 8 mm. The entire area in aggregate measured 6.5 cm. By physical exam there was no palpable abnormality. Ultrasonography showed an irregular hypoechoic mass at the 6:00 position of the right breast measuring 1 cm maximally. The right axilla was normal.  Biopsy of the right breast mass in question as well as the more lateral area of calcifications on 10/11/2013 showed ([id number]) most to be positive for an invasive ductal carcinoma, both estrogen receptor 85-90% positive with strong staining intensity, both progesterone receptor negative, with an MIB-1 of 17%. HER-2 was amplified, with a signals ratio of 3.44 and a copy number per cell 04 0.30.  Bilateral breast MRI 10/19/2013 showed again a spiculated mass in the 6:00 region of the right breast measuring 1.2 cm, a slightly more lateral hematoma associated with a second biopsy and also with clumped enhancement, and asymmetric none masslike enhancement in most of the right breast, the entire area of abnormality measuring 9.0 cm. The left breast was unremarkable and there were no abnormal appearing lymph nodes.  Her subsequent history is as detailed below    INTERVAL HISTORY: Angela Gross  returns today accompanied by her husband [name] for followup of her right breast cancer. She is here today for brief followup, in anticipation of initiating her first cycle of adjuvant chemotherapy tomorrow, April 21. The plan is to treat with 6 q. three-week doses of docetaxel/carboplatin given with trastuzumab/pertuzumab.  She will be receiving Neulasta on day 2 for granulocyte support. She'll  be continuing trastuzumab/pertuzumab every 3 weeks for total of one year.   Treatment has been delayed slightly due to problems with cellulitis and drainage in the reconstructed right breast. This has healed well, and in fact she scheduled to see her plastic surgeon later today for final release for treatment. [name] herself is feeling well, with the exception of anxiety about to [facility] treatment.  [name] already has all of her antinausea medications on hand at home. I had seen her several weeks ago to review her antinausea regimen, and she will be utilizing dexamethasone, prochlorperazine, lorazepam, and ondansetron as instructed. She began pre-medicating with oral dexamethasone this morning as instructed. Of course she does have a history of diabetes, and I have asked her to follow her blood sugars very closely on a daily basis, especially the week of chemotherapy.   REVIEW OF SYSTEMS: Nimisha denies any fevers, chills, or night sweats. She does have hot flashes. Her energy level is fair. She's had no rashes or skin changes, and denies any abnormal bruising or bleeding. Her appetite is reduced, but she's had no nausea or emesis and denies any recent change in bowel or bladder habits. She has regular bowel movements, with neither diarrhea nor constipation. She's had no cough, phlegm production, increased shortness of breath, orthopnea, peripheral swelling, chest pain, or palpitations.  She denies any abnormal headaches or dizziness and has had no recent change in vision. She does have chronic joint pain  attributable to rheumatoid arthritis, but denies any new pain. At baseline, she also denies any peripheral neuropathy in either the upper or lower extremities.  A detailed review of systems is otherwise stable and noncontributory.   PAST MEDICAL HISTORY: Past Medical History  Diagnosis Date  . Raynaud disease   . Osteoporosis   . Hypertension   . PONV (postoperative nausea and vomiting)   . Wears glasses   . Full dentures   . Fibromyalgia   . Rheumatoid arthritis     "elbows; fingers; toes"  . Osteoarthritis of both knees   . Degenerative disc disease, cervical   . Chronic left shoulder pain     "work related injury" (01/10/2014)  . Breast cancer     "right" (01/10/2014)  . Type II diabetes mellitus     has not taken meds in over a month (01/10/2014)  . Syncope and collapse     "4 times in the last year" (01/10/2014)    PAST SURGICAL HISTORY: Past Surgical History  Procedure Laterality Date  . Colonoscopy    . Mastectomy w/ sentinel node biopsy Right 12/13/2013    Procedure: RIGHT TOTAL MASTECTOMY WITH SENTINEL LYMPH NODE BIOPSY;  Surgeon: Adin Hector, MD;  Location: [facility];  Service: General;  Laterality: Right;  . Portacath placement Right 12/13/2013    Procedure: INSERTION PORT-A-CATH;  Surgeon: Adin Hector, MD;  Location: [facility];  Service: General;  Laterality: Right;  . Breast reconstruction with placement of tissue expander and flex hd (acellular hydrated dermis) Right 12/13/2013    Procedure: RIGHT BREAST RECONSTRUCTION WITH PLACEMENT OF TISSUE EXPANDER AND FLEX HD (ACELLULAR HYDRATED DERMIS);  Surgeon: Irene Limbo, MD;  Location: [facility];  Service: Plastics;  Laterality: Right;  . Lipoma excision Bilateral 12/13/2013    Procedure: EXCISION OF LEFT BREAST KELOID AND RIGHT CHEST KELOID;  Surgeon: Irene Limbo, MD;  Location: [facility];  Service: Plastics;  Laterality: Bilateral;  . Mastectomy     . Cholecystectomy  1970's  . Vaginal hysterectomy  1980    no salpingo-oophoretomu  . Breast biopsy Right 10/2013  . Tissue expander placement Right 01/10/2014    Procedure: Incision and Drainage Right Breast Seroma with TISSUE EXPANDER exchange;  Surgeon: Irene Limbo, MD;  Location: [facility];  Service: Plastics;  Laterality: Right;    FAMILY HISTORY History reviewed. No pertinent family history. The patient is little information about her father. Her mother died from a heart attack at the age of 85. She had been diagnosed with breast cancer in her late 17s. The patient had 4 brothers and 4 sisters. There is no other history of breast or ovarian cancer in the family to her knowledge.  GYNECOLOGIC HISTORY:    She does not recall her age of menarche. She underwent hysterectomy in her 63s. She did not receive hormone replacement. First live birth at 31. She was GX P2.  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 She is currently disabled secondary to her rheumatoid arthritis. Her husband Deshauna Cayson is a retired Administrator. He now works part time at [employer]. Son [name] works in [place] as a Patent attorney. Daughter [name] is a Social worker.    ADVANCED DIRECTIVES: Not  in place   HEALTH MAINTENANCE: (Updated 01/30/2014) History  Substance Use Topics  . Smoking status: Never Smoker   . Smokeless tobacco: Never Used  . Alcohol Use: No     Colonoscopy: Not on file  PAP: Status post remote hysterectomy  Bone density: At [facility] hospital 01/05/2009 showed osteopenia with a T score of -1.7  Lipid panel: Not on file   Allergies  Allergen Reactions  . Codeine Other (See Comments) and Hives    Altered mental status, numbness  Altered mental status, Numbess  . Diazepam     Other reaction(s): Delusions (intolerance) hallucinations     Current Outpatient  Prescriptions  Medication Sig Dispense Refill  . cyclobenzaprine (FLEXERIL) 10 MG tablet Take 10 mg by mouth 3 (three) times daily as needed for muscle spasms.      Marland Kitchen dexamethasone (DECADRON) 4 MG tablet 2 tabs by mouth with food twice daily on day before and 3 days after each chemo  30 tablet  1  . lidocaine-prilocaine (EMLA) cream Apply 1 application topically as needed. 1-2 hrs before port access  30 g  4  . oxyCODONE (ROXICODONE) 5 MG immediate release tablet Take 1 tablet (5 mg total) by mouth every 4 (four) hours as needed for severe pain.  40 tablet  0  . Vitamin D, Ergocalciferol, (DRISDOL) 50000 UNITS CAPS capsule Take 50,000 Units by mouth every 7 (seven) days. Wednesdays      . Liraglutide (VICTOZA) 18 MG/3ML SOPN Inject 0.6 mg into the skin daily.       Marland Kitchen LORazepam (ATIVAN) 0.5 MG tablet Take 0.5-1 tablets (0.25-0.5 mg total) by mouth at bedtime as needed for anxiety or sleep (or nausea).  30 tablet  0  . meloxicam (MOBIC) 7.5 MG tablet Take 1 tablet (7.5 mg total) by mouth daily.  10 tablet  0  . mupirocin ointment (BACTROBAN) 2 % Place 1 application into the nose 2 (two) times daily.  22 g  0  . ondansetron (ZOFRAN) 8 MG tablet Take 8 mg by mouth See admin instructions. 1 tab by mouth twice daily x 3 days after each chemo, then 1 tab by mouth every 12 hrs if needed for nausea      . prochlorperazine (COMPAZINE) 10 MG tablet Take 10 mg by mouth once. 1 tab by mouth with meals and at bedtime x 3 days after each chemo, then 1 tab by mouth every 6 hrs if needed for nausea      . traMADol (ULTRAM) 50 MG tablet Take 50 mg by mouth 3 (three) times daily as needed (pain).        No current facility-administered medications for this visit.    OBJECTIVE:   Filed Vitals:   01/30/14 0932  BP: 126/75  Pulse: 86  Temp: 98.9 F (37.2 C)  Resp: 18     Body mass index is 28.14 kg/(m^2).    ECOG FS: 1 [name] who appears  anxious but is in no acute distress. Filed  Weights   01/30/14 0932  Weight: 144 lb 1.6 oz (65.363 kg)   Physical Exam: HEENT:  Sclerae anicteric.  Oropharynx clear and moist. Neck supple, trachea midline.  NODES:  No cervical or supraclavicular lymphadenopathy palpated.  BREAST EXAM:  Patient status post right mastectomy with expander in place. Drain has been removed, and the area is healing well with no evidence of cellulitis. No excessive erythema or edema noted. Left breast is unremarkable. Axillae are benign bilaterally, with  no palpable lymphadenopathy. LUNGS:  Clear to auscultation bilaterally.  No wheezes or rhonchi HEART:  Regular rate and rhythm. No murmur. ABDOMEN:  Soft, nontender.  Positive bowel sounds.  MSK:  No focal spinal tenderness to palpation. Good range of motion bilaterally in the upper extremities. EXTREMITIES:  No peripheral edema.  No lymphedema in the right upper extremity. SKIN:  Benign with no visible rashes. No excessive ecchymoses. No petechiae. No pallor. NEURO:  Nonfocal. Well oriented.  Anxious affect.      LAB RESULTS:    Lab Results  Component Value Date   WBC 7.0 01/30/2014   NEUTROABS 4.9 01/30/2014   HGB 12.0 01/30/2014   HCT 37.6 01/30/2014   MCV 83.7 01/30/2014   PLT 201 01/30/2014      Chemistry      Component Value Date/Time   NA 142 01/30/2014 0909   NA 138 01/09/2014 1522   K 4.2 01/30/2014 0909   K 4.2 01/09/2014 1522   CL 100 01/09/2014 1522   CO2 26 01/30/2014 0909   CO2 25 01/09/2014 1522   BUN 8.7 01/30/2014 0909   BUN 11 01/09/2014 1522   CREATININE 0.8 01/30/2014 0909   CREATININE 0.99 01/09/2014 1522      Component Value Date/Time   CALCIUM 9.6 01/30/2014 0909   CALCIUM 8.5 01/09/2014 1522   ALKPHOS 99 01/30/2014 0909   ALKPHOS 102 12/09/2013 1400   AST 17 01/30/2014 0909   AST 36 12/09/2013 1400   ALT 11 01/30/2014 0909   ALT 30 12/09/2013 1400   BILITOT 0.27 01/30/2014 0909   BILITOT 0.2* 12/09/2013 1400       STUDIES:  Echocardiogram on 12/20/2013 showed an ejection  fraction of 55-60%.  DG CHEST PORT 1 VIEW 03/03/215   CLINICAL DATA: Port-A-Cath placement.  EXAM:  PORTABLE CHEST - 1 VIEW  COMPARISON: 12/09/2013  FINDINGS:  There has been placement of a right subclavian Port-A-Cath which has  tip at the cavoatrial junction. No definite right pneumothorax.  There is a small caliber catheter projected over the right chest  with tip just right of midline at the level of the hilum which may  be related to patient's recent right breast surgery. Lungs are  somewhat hypoinflated with minimal prominence of the perihilar  markings suggesting minimal vascular congestion. There is subtle  opacification over the lateral left base likely atelectasis.  Cardiomediastinal silhouette is within normal. There is a small  right cervical rib. There are surgical clips over the right axilla.  Remainder of the exam is unchanged.  IMPRESSION:  Minimal prominence of the perihilar markings suggesting minimal  vascular congestion. Possible minimal left basilar atelectasis.  Right subclavian Port-A-Cath with tip at the region of the  cavoatrial junction. No definite pneumothorax.  Small caliber catheter over the right chest with tip just right of  midline as this may represent a surgical drain related to patient's  recent breast surgery.  Electronically Signed  By: Marin Olp M.D.  On: 12/13/2013 13:27    ASSESSMENT: 63 y.o. [place] woman   (1)  s/p biopsy of separate Right breast masses 10/11/2013 for a clinical mT1 N0, stage IA invasive ductal carcinoma, grade II-III, one of the mass is being 85% estrogen receptor positive, progesterone receptor negative, with an MIB-1 of 17% and HER-2 amplified.  (2) status post right mastectomy and sentinel lymph node sampling with immediate expander placement 12/13/2013 for a pT1c pN0 invasive ductal carcinoma, grade 3, again HER-2 positive.  (3) To be treated  in the adjuvant setting with carboplatin, docetaxel, trastuzumab  and pertuzumab 01/24/2014. The plan is to repeat these agents every 21 days x6, after which the anti-HER-2 treatment (both trastuzumab and pertuzumab) will be continued to the total one year.  (4) the patient will not need postmastectomy radiation, so antiestrogen therapy will be started as soon as chemotherapy is completed  (5) rheumatoid arthritis, usually on Remicade, prednisone and methotrexate, held prior to mastectomy.  (6)  cellulitis affecting the right reconstructed breast diagnosed at 01/09/2014 with subsequent surgery to drain the seroma and exchange tissue expander.    PLAN: Cherryl is  ready to proceed with adjuvant chemotherapy, and will return tomorrow as scheduled, April 21, for day 1 cycle 1 of docetaxel/carboplatin given with trastuzumab/pertuzumab.  She will return on day 2, April 22, for her Neulasta injection, and I will see her next week on April 29 for assessment of chemotoxicity. We again reviewed her antinausea regimen. We also discussed the use of Imodium should she began to experience diarrhea associated with the pertuzumab.    All of the above was reviewed in detail with the patient and her husband today, and she was given all these instructions in writing. She voiced her understanding and agreement with our plan thus far, and will call with any changes or problems.   Theotis Burrow, PA-C   01/30/2014 10:25 AM

## 2014-01-31 ENCOUNTER — Other Ambulatory Visit: Payer: Medicare Other

## 2014-01-31 ENCOUNTER — Ambulatory Visit: Payer: Medicare Other

## 2014-01-31 ENCOUNTER — Ambulatory Visit: Payer: Medicare Other | Admitting: Physician Assistant

## 2014-01-31 ENCOUNTER — Encounter: Payer: Self-pay | Admitting: Oncology

## 2014-01-31 ENCOUNTER — Telehealth: Payer: Self-pay | Admitting: *Deleted

## 2014-01-31 ENCOUNTER — Telehealth: Payer: Self-pay | Admitting: Oncology

## 2014-01-31 NOTE — Telephone Encounter (Signed)
Called pt to follow-up from message she received yesterday. Communicated with pt about her chemo appt being cancelled for today due to allowing her incision to rt breast to further heal. Pt has   picked up Rx for the antibiotic and knows to take it for the next 7 days. I told pt she will return on May 12 for labs, doctor's visit and chemo. Pt verbalized understanding. I told pt if she has any problems to give [place] a call @ [phone].

## 2014-01-31 NOTE — Telephone Encounter (Signed)
Cancelled all the appts for April per md orders.

## 2014-02-01 ENCOUNTER — Ambulatory Visit: Payer: Medicare Other

## 2014-02-08 ENCOUNTER — Other Ambulatory Visit: Payer: Medicare Other

## 2014-02-08 ENCOUNTER — Ambulatory Visit: Payer: Medicare Other | Admitting: Physician Assistant

## 2014-02-14 ENCOUNTER — Ambulatory Visit: Payer: Medicare Other | Admitting: Oncology

## 2014-02-14 ENCOUNTER — Other Ambulatory Visit: Payer: Medicare Other

## 2014-02-14 ENCOUNTER — Ambulatory Visit: Payer: Medicare Other

## 2014-02-15 ENCOUNTER — Ambulatory Visit: Payer: Medicare Other

## 2014-02-21 ENCOUNTER — Ambulatory Visit (HOSPITAL_BASED_OUTPATIENT_CLINIC_OR_DEPARTMENT_OTHER): Payer: Medicare Other | Admitting: Hematology and Oncology

## 2014-02-21 ENCOUNTER — Ambulatory Visit (HOSPITAL_BASED_OUTPATIENT_CLINIC_OR_DEPARTMENT_OTHER): Payer: Medicare Other

## 2014-02-21 ENCOUNTER — Other Ambulatory Visit: Payer: Medicare Other

## 2014-02-21 ENCOUNTER — Ambulatory Visit: Payer: Medicare Other | Admitting: Physician Assistant

## 2014-02-21 VITALS — BP 137/85 | HR 85 | Temp 98.5°F | Resp 18

## 2014-02-21 VITALS — Ht 60.0 in | Wt 147.2 lb

## 2014-02-21 VITALS — BP 137/83 | HR 112 | Temp 97.8°F | Resp 16

## 2014-02-21 DIAGNOSIS — M069 Rheumatoid arthritis, unspecified: Secondary | ICD-10-CM

## 2014-02-21 DIAGNOSIS — C50311 Malignant neoplasm of lower-inner quadrant of right female breast: Secondary | ICD-10-CM

## 2014-02-21 DIAGNOSIS — C50919 Malignant neoplasm of unspecified site of unspecified female breast: Secondary | ICD-10-CM

## 2014-02-21 DIAGNOSIS — M2550 Pain in unspecified joint: Secondary | ICD-10-CM

## 2014-02-21 DIAGNOSIS — Z95828 Presence of other vascular implants and grafts: Secondary | ICD-10-CM

## 2014-02-21 DIAGNOSIS — N61 Mastitis without abscess: Secondary | ICD-10-CM

## 2014-02-21 DIAGNOSIS — D72829 Elevated white blood cell count, unspecified: Secondary | ICD-10-CM

## 2014-02-21 DIAGNOSIS — Z5112 Encounter for antineoplastic immunotherapy: Secondary | ICD-10-CM

## 2014-02-21 DIAGNOSIS — Z5111 Encounter for antineoplastic chemotherapy: Secondary | ICD-10-CM

## 2014-02-21 LAB — CBC WITH DIFFERENTIAL/PLATELET
BASO%: 0 % (ref 0.0–2.0)
Basophils Absolute: 0 10*3/uL (ref 0.0–0.1)
EOS%: 0 % (ref 0.0–7.0)
Eosinophils Absolute: 0 10*3/uL (ref 0.0–0.5)
HCT: 38.2 % (ref 34.8–46.6)
HGB: 12 g/dL (ref 11.6–15.9)
LYMPH#: 1.5 10*3/uL (ref 0.9–3.3)
LYMPH%: 5.7 % — AB (ref 14.0–49.7)
MCH: 26 pg (ref 25.1–34.0)
MCHC: 31.4 g/dL — AB (ref 31.5–36.0)
MCV: 82.6 fL (ref 79.5–101.0)
MONO#: 1.1 10*3/uL — AB (ref 0.1–0.9)
MONO%: 4.3 % (ref 0.0–14.0)
NEUT%: 90 % — ABNORMAL HIGH (ref 38.4–76.8)
NEUTROS ABS: 23 10*3/uL — AB (ref 1.5–6.5)
PLATELETS: 210 10*3/uL (ref 145–400)
RBC: 4.62 10*6/uL (ref 3.70–5.45)
RDW: 14.1 % (ref 11.2–14.5)
WBC: 25.6 10*3/uL — AB (ref 3.9–10.3)

## 2014-02-21 LAB — COMPREHENSIVE METABOLIC PANEL (CC13)
ALBUMIN: 3.4 g/dL — AB (ref 3.5–5.0)
ALT: 10 U/L (ref 0–55)
ANION GAP: 12 meq/L — AB (ref 3–11)
AST: 11 U/L (ref 5–34)
Alkaline Phosphatase: 106 U/L (ref 40–150)
BILIRUBIN TOTAL: 0.23 mg/dL (ref 0.20–1.20)
BUN: 12.7 mg/dL (ref 7.0–26.0)
CALCIUM: 9.7 mg/dL (ref 8.4–10.4)
CO2: 20 meq/L — AB (ref 22–29)
Chloride: 108 mEq/L (ref 98–109)
Creatinine: 0.9 mg/dL (ref 0.6–1.1)
GLUCOSE: 187 mg/dL — AB (ref 70–140)
POTASSIUM: 3.7 meq/L (ref 3.5–5.1)
Sodium: 140 mEq/L (ref 136–145)
TOTAL PROTEIN: 7.6 g/dL (ref 6.4–8.3)

## 2014-02-21 MED ORDER — SODIUM CHLORIDE 0.9 % IV SOLN
Freq: Once | INTRAVENOUS | Status: AC
  Administered 2014-02-21: 11:00:00 via INTRAVENOUS

## 2014-02-21 MED ORDER — SODIUM CHLORIDE 0.9 % IJ SOLN
10.0000 mL | INTRAMUSCULAR | Status: DC | PRN
  Administered 2014-02-21 (×2): 10 mL via INTRAVENOUS
  Filled 2014-02-21: qty 10

## 2014-02-21 MED ORDER — HEPARIN SOD (PORK) LOCK FLUSH 100 UNIT/ML IV SOLN
500.0000 [IU] | Freq: Once | INTRAVENOUS | Status: AC | PRN
  Administered 2014-02-21: 500 [IU]
  Filled 2014-02-21: qty 5

## 2014-02-21 MED ORDER — ONDANSETRON 16 MG/50ML IVPB (CHCC)
16.0000 mg | Freq: Once | INTRAVENOUS | Status: AC
  Administered 2014-02-21: 16 mg via INTRAVENOUS

## 2014-02-21 MED ORDER — ACETAMINOPHEN 325 MG PO TABS
650.0000 mg | ORAL_TABLET | Freq: Once | ORAL | Status: AC
  Administered 2014-02-21: 650 mg via ORAL

## 2014-02-21 MED ORDER — DOCETAXEL CHEMO INJECTION 160 MG/16ML
75.0000 mg/m2 | Freq: Once | INTRAVENOUS | Status: AC
  Administered 2014-02-21: 130 mg via INTRAVENOUS
  Filled 2014-02-21: qty 13

## 2014-02-21 MED ORDER — DEXAMETHASONE SODIUM PHOSPHATE 20 MG/5ML IJ SOLN
20.0000 mg | Freq: Once | INTRAMUSCULAR | Status: AC
  Administered 2014-02-21: 20 mg via INTRAVENOUS

## 2014-02-21 MED ORDER — SODIUM CHLORIDE 0.9 % IV SOLN
537.0000 mg | Freq: Once | INTRAVENOUS | Status: AC
  Administered 2014-02-21: 540 mg via INTRAVENOUS
  Filled 2014-02-21: qty 54

## 2014-02-21 MED ORDER — LORAZEPAM 1 MG PO TABS
0.5000 mg | ORAL_TABLET | Freq: Once | ORAL | Status: AC
  Administered 2014-02-21: 0.5 mg via SUBLINGUAL

## 2014-02-21 MED ORDER — LORAZEPAM 1 MG PO TABS
ORAL_TABLET | ORAL | Status: AC
  Filled 2014-02-21: qty 1

## 2014-02-21 MED ORDER — PERTUZUMAB CHEMO INJECTION 420 MG/14ML
840.0000 mg | Freq: Once | INTRAVENOUS | Status: AC
  Administered 2014-02-21: 840 mg via INTRAVENOUS
  Filled 2014-02-21: qty 28

## 2014-02-21 MED ORDER — ACETAMINOPHEN 325 MG PO TABS
ORAL_TABLET | ORAL | Status: AC
  Filled 2014-02-21: qty 2

## 2014-02-21 MED ORDER — DEXAMETHASONE SODIUM PHOSPHATE 20 MG/5ML IJ SOLN
INTRAMUSCULAR | Status: AC
  Filled 2014-02-21: qty 5

## 2014-02-21 MED ORDER — DIPHENHYDRAMINE HCL 25 MG PO CAPS
ORAL_CAPSULE | ORAL | Status: AC
  Filled 2014-02-21: qty 1

## 2014-02-21 MED ORDER — TRASTUZUMAB CHEMO INJECTION 440 MG
8.0000 mg/kg | Freq: Once | INTRAVENOUS | Status: AC
  Administered 2014-02-21: 567 mg via INTRAVENOUS
  Filled 2014-02-21: qty 27

## 2014-02-21 MED ORDER — SODIUM CHLORIDE 0.9 % IJ SOLN
10.0000 mL | INTRAMUSCULAR | Status: DC | PRN
  Filled 2014-02-21: qty 10

## 2014-02-21 MED ORDER — DIPHENHYDRAMINE HCL 25 MG PO CAPS
25.0000 mg | ORAL_CAPSULE | Freq: Once | ORAL | Status: AC
  Administered 2014-02-21: 25 mg via ORAL

## 2014-02-21 MED ORDER — ONDANSETRON 16 MG/50ML IVPB (CHCC)
INTRAVENOUS | Status: AC
  Filled 2014-02-21: qty 16

## 2014-02-21 NOTE — Progress Notes (Signed)
[place]  [phone] [phone]     ID: [name] OB: 1951/06/11  MR#: [id number]  MLY#:[id number]  PCP: Salena Saner., MD GYN:   SUFanny Skates OTHER MD: [name], Lyndee Leo Sanger, Arnoldo Hooker [place]ma[place]  CHIEF COMPLAINT:  Right Breast Cancer and for initiation of chemotherapy   HISTORY OF PRESENT ILLNESS: As per previously documented note: Edelmira had routine screening mammography at the breast center 09/21/2013 showing a possible mass in calcifications in the right breast. On 10/11/2013 she underwent right diagnostic mammography and ultrasonography. This confirmed a spiculated 1.2 cm mass in the central right breast, with a group of pleomorphic calcifications slightly laterally. The calcifications spanning 1.6 cm. A second group of calcifications extended inferiorly and spanned 8 mm. The entire area in aggregate measured 6.5 cm. By physical exam there was no palpable abnormality. Ultrasonography showed an irregular hypoechoic mass at the 6:00 position of the right breast measuring 1 cm maximally. The right axilla was normal.  Biopsy of the right breast mass in question as well as the more lateral area of calcifications on 10/11/2013 showed ([id number]) most to be positive for an invasive ductal carcinoma, both estrogen receptor 85-90% positive with strong staining intensity, both progesterone receptor negative, with an MIB-1 of 17%. HER-2 was amplified, with a signals ratio of 3.44 and a copy number per cell 04 0.30.  Bilateral breast MRI 10/19/2013 showed again a spiculated mass in the 6:00 region of the right breast measuring 1.2 cm, a slightly more lateral hematoma associated with a second biopsy and also with clumped enhancement, and asymmetric none masslike enhancement in most of the right breast, the entire area of abnormality measuring 9.0 cm. The left breast was unremarkable and there were no abnormal appearing lymph nodes.  Her  subsequent history is as detailed below    INTERVAL HISTORY: Lucita returns today  accompanied by her daughter  for followup visit for her right breast cancer. Her chemotherapy has been postponed in view of cellulitis and drainage in the reconstructed right breast. She says she has seen the surgery and her cellulitis wound drainage resolved and she was cleared to proceed with chemotherapy. She denies any pain in the right breast. She says that she feels very anxietic and she did not take tramadol/ oxycodone for her rheumatoid arthritis in view of concerns for drug interactions She took her dexamethasone medication as scheduled yesterday and she already has antinausea medication at home  REVIEW OF SYSTEMS: She does complain of rheumatoid arthritis pains and also anxiety. She takes medications as needed for constipation. She denies any shortness of breath, chest pain, palpitation, blurred vision or headaches denies any nausea vomiting fevers Remaining 10 point review of systems were assessed and negative  PAST MEDICAL HISTORY: Past Medical History  Diagnosis Date  . Raynaud disease   . Osteoporosis   . Hypertension   . PONV (postoperative nausea and vomiting)   . Wears glasses   . Full dentures   . Fibromyalgia   . Rheumatoid arthritis     "elbows; fingers; toes"  . Osteoarthritis of both knees   . Degenerative disc disease, cervical   . Chronic left shoulder pain     "work related injury" (01/10/2014)  . Breast cancer     "right" (01/10/2014)  . Type II diabetes mellitus     has not taken meds in over a month (01/10/2014)  . Syncope and collapse     "4 times in the last year" (01/10/2014)  PAST SURGICAL HISTORY: Past Surgical History  Procedure Laterality Date  . Colonoscopy    . Mastectomy w/ sentinel node biopsy Right 12/13/2013    Procedure: RIGHT TOTAL MASTECTOMY WITH SENTINEL LYMPH NODE BIOPSY;  Surgeon: Adin Hector, MD;  Location: [facility];  Service:  General;  Laterality: Right;  . Portacath placement Right 12/13/2013    Procedure: INSERTION PORT-A-CATH;  Surgeon: Adin Hector, MD;  Location: [facility];  Service: General;  Laterality: Right;  . Breast reconstruction with placement of tissue expander and flex hd (acellular hydrated dermis) Right 12/13/2013    Procedure: RIGHT BREAST RECONSTRUCTION WITH PLACEMENT OF TISSUE EXPANDER AND FLEX HD (ACELLULAR HYDRATED DERMIS);  Surgeon: Irene Limbo, MD;  Location: [facility];  Service: Plastics;  Laterality: Right;  . Lipoma excision Bilateral 12/13/2013    Procedure: EXCISION OF LEFT BREAST KELOID AND RIGHT CHEST KELOID;  Surgeon: Irene Limbo, MD;  Location: [facility];  Service: Plastics;  Laterality: Bilateral;  . Mastectomy    . Cholecystectomy  1970's  . Vaginal hysterectomy  1980    no salpingo-oophoretomu  . Breast biopsy Right 10/2013  . Tissue expander placement Right 01/10/2014    Procedure: Incision and Drainage Right Breast Seroma with TISSUE EXPANDER exchange;  Surgeon: Irene Limbo, MD;  Location: [facility];  Service: Plastics;  Laterality: Right;    FAMILY HISTORY No family history on file. The patient is little information about her father. Her mother died from a heart attack at the age of 34. She had been diagnosed with breast cancer in her late 62s. The patient had 4 brothers and 4 sisters. There is no other history of breast or ovarian cancer in the family to her knowledge.  GYNECOLOGIC HISTORY:    She does not recall her age of menarche. She underwent hysterectomy in her 15s. She did not receive hormone replacement. First live birth at 37. She was GX P2.  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 She is currently disabled secondary to her rheumatoid arthritis. Her husband Mahalie Kanner is a retired Administrator. He now works part time at [employer]. Son  [name] works in [place] as a Patent attorney. Daughter [name] is a Social worker.    ADVANCED DIRECTIVES: Not in place   HEALTH MAINTENANCE: (Updated 01/30/2014) History  Substance Use Topics  . Smoking status: Never Smoker   . Smokeless tobacco: Never Used  . Alcohol Use: No     Colonoscopy: Not on file  PAP: Status post remote hysterectomy  Bone density: At [facility] hospital 01/05/2009 showed osteopenia with a T score of -1.7  Lipid panel: Not on file   Allergies  Allergen Reactions  . Codeine Other (See Comments) and Hives    Altered mental status, numbness  Altered mental status, Numbess  . Diazepam     Other reaction(s): Delusions (intolerance) hallucinations     Current Outpatient Prescriptions  Medication Sig Dispense Refill  . cyclobenzaprine (FLEXERIL) 10 MG tablet Take 10 mg by mouth 3 (three) times daily as needed for muscle spasms.      Marland Kitchen dexamethasone (DECADRON) 4 MG tablet 2 tabs by mouth with food twice daily on day before and 3 days after each chemo  30 tablet  1  . lidocaine-prilocaine (EMLA) cream Apply 1 application topically as needed. 1-2 hrs before port access  30 g  4  . Liraglutide (VICTOZA) 18 MG/3ML SOPN Inject 0.6 mg into the skin daily.       Marland Kitchen LORazepam (ATIVAN) 0.5 MG tablet Take 0.5-1 tablets (0.25-0.5 mg total) by mouth at bedtime as needed for anxiety or sleep (or nausea).  30 tablet  0  . meloxicam (MOBIC) 7.5 MG tablet Take 1 tablet (7.5 mg total) by mouth daily.  10 tablet  0  . mupirocin ointment (BACTROBAN) 2 % Place 1 application into the nose 2 (two) times daily.  22 g  0  . ondansetron (ZOFRAN) 8 MG tablet Take 8 mg by mouth See admin instructions. 1 tab by mouth twice daily x 3 days after each chemo, then 1 tab by mouth every 12 hrs if needed for nausea      . oxyCODONE (ROXICODONE) 5 MG immediate release tablet Take 1 tablet (5 mg total) by mouth every 4 (four) hours as needed for severe  pain.  40 tablet  0  . prochlorperazine (COMPAZINE) 10 MG tablet Take 10 mg by mouth once. 1 tab by mouth with meals and at bedtime x 3 days after each chemo, then 1 tab by mouth every 6 hrs if needed for nausea      . traMADol (ULTRAM) 50 MG tablet Take 50 mg by mouth 3 (three) times daily as needed (pain).       . Vitamin D, Ergocalciferol, (DRISDOL) 50000 UNITS CAPS capsule Take 50,000 Units by mouth every 7 (seven) days. Wednesdays       No current facility-administered medications for this visit.   Facility-Administered Medications Ordered in Other Visits  Medication Dose Route Frequency Provider Last Rate Last Dose  . CARBOplatin (PARAPLATIN) 540 mg in sodium chloride 0.9 % 250 mL chemo infusion  540 mg Intravenous Once Chauncey Cruel, MD 608 mL/hr at 02/21/14 1728 540 mg at 02/21/14 1728  . heparin lock flush 100 unit/mL  500 Units Intracatheter Once PRN Chauncey Cruel, MD      . sodium chloride 0.9 % injection 10 mL  10 mL Intravenous PRN Wilmon Arms, MD   10 mL at 02/21/14 0913  . sodium chloride 0.9 % injection 10 mL  10 mL Intracatheter PRN Chauncey Cruel, MD        OBJECTIVE:   There were no vitals filed for this visit.   Body mass index is 28.75 kg/(m^2).    ECOG FS: 1 [name] who appears  anxious but is in no acute distress. Filed Weights   02/21/14 0949  Weight: 147 lb 3.2 oz (66.769 kg)   Physical Exam: HEENT:  Sclerae anicteric.  Oropharynx clear and moist. Neck supple, trachea midline.  NODES:  No cervical or supraclavicular lymphadenopathy palpated.  BREAST EXAM:  Patient status post right mastectomy with expander in place. Well-healed surgical scar  with no evidence of cellulitis. Left breast is unremarkable. Axillae are benign bilaterally, with no palpable lymphadenopathy. LUNGS:  Clear to auscultation bilaterally.  No wheezes or rhonchi HEART:  Regular rate and rhythm. No murmur. ABDOMEN:  Soft, nontender.  Positive bowel  sounds.  MSK:  No focal spinal tenderness to palpation. Good range of motion bilaterally in the upper extremities. EXTREMITIES:  No peripheral edema.  No lymphedema in the right upper extremity. SKIN:  Benign with no visible rashes. No excessive ecchymoses. No petechiae. No pallor. NEURO:  Nonfocal. Well oriented.  Anxious affect.      LAB RESULTS:    Lab Results  Component Value  Date   WBC 25.6* 02/21/2014   NEUTROABS 23.0* 02/21/2014   HGB 12.0 02/21/2014   HCT 38.2 02/21/2014   MCV 82.6 02/21/2014   PLT 210 02/21/2014      Chemistry      Component Value Date/Time   NA 140 02/21/2014 0904   NA 138 01/09/2014 1522   K 3.7 02/21/2014 0904   K 4.2 01/09/2014 1522   CL 100 01/09/2014 1522   CO2 20* 02/21/2014 0904   CO2 25 01/09/2014 1522   BUN 12.7 02/21/2014 0904   BUN 11 01/09/2014 1522   CREATININE 0.9 02/21/2014 0904   CREATININE 0.99 01/09/2014 1522      Component Value Date/Time   CALCIUM 9.7 02/21/2014 0904   CALCIUM 8.5 01/09/2014 1522   ALKPHOS 106 02/21/2014 0904   ALKPHOS 102 12/09/2013 1400   AST 11 02/21/2014 0904   AST 36 12/09/2013 1400   ALT 10 02/21/2014 0904   ALT 30 12/09/2013 1400   BILITOT 0.23 02/21/2014 0904   BILITOT 0.2* 12/09/2013 1400       STUDIES:  Echocardiogram on 12/20/2013 showed an ejection fraction of 55-60%.  DG CHEST PORT 1 VIEW 03/03/215   CLINICAL DATA: Port-A-Cath placement.  EXAM:  PORTABLE CHEST - 1 VIEW  COMPARISON: 12/09/2013  FINDINGS:  There has been placement of a right subclavian Port-A-Cath which has  tip at the cavoatrial junction. No definite right pneumothorax.  There is a small caliber catheter projected over the right chest  with tip just right of midline at the level of the hilum which may  be related to patient's recent right breast surgery. Lungs are  somewhat hypoinflated with minimal prominence of the perihilar  markings suggesting minimal vascular congestion. There is subtle  opacification over the lateral left  base likely atelectasis.  Cardiomediastinal silhouette is within normal. There is a small  right cervical rib. There are surgical clips over the right axilla.  Remainder of the exam is unchanged.  IMPRESSION:  Minimal prominence of the perihilar markings suggesting minimal  vascular congestion. Possible minimal left basilar atelectasis.  Right subclavian Port-A-Cath with tip at the region of the  cavoatrial junction. No definite pneumothorax.  Small caliber catheter over the right chest with tip just right of  midline as this may represent a surgical drain related to patient's  recent breast surgery.  Electronically Signed  By: Marin Olp M.D.  On: 12/13/2013 13:27    ASSESSMENT: 63 y.o. [place] woman   (1)  s/p biopsy of separate Right breast masses 10/11/2013 for a clinical mT1 N0, stage IA invasive ductal carcinoma, grade II-III, one of the mass is being 85% estrogen receptor positive, progesterone receptor negative, with an MIB-1 of 17% and HER-2 amplified.  (2) status post right mastectomy and sentinel lymph node sampling with immediate expander placement 12/13/2013 for a pT1c pN0 invasive ductal carcinoma, grade 3, again HER-2 positive.  (3) To be treated in the adjuvant setting with carboplatin, docetaxel, trastuzumab and pertuzumab 01/24/2014. The plan is to repeat these agents every 21 days x6, after which the anti-HER-2 treatment (both trastuzumab and pertuzumab) will be continued to the total one year.  (4) the patient will not need postmastectomy radiation, so antiestrogen therapy will be started as soon as chemotherapy is completed  (5) rheumatoid arthritis, usually on Remicade, prednisone and methotrexate, held prior to mastectomy.  (6)  cellulitis affecting the right reconstructed breast diagnosed at 01/09/2014 with subsequent surgery to drain the seroma and exchange tissue expander.    (  7) leukocytosis most likely secondary to steroids   PLAN: Dannae is   ready to proceed with cycle #1 adjuvant chemotherapy with docetaxel/carboplatin given with trastuzumab/pertuzumab. She will return tomorrow as scheduled for Neulasta injection. I have once again reiterated the benefits and side effects of chemotherapy.  I have asked the patient to take oxycodone, tramadol, and Flexeril as needed for her rheumatoid arthritis.  We again reviewed her antinausea meds and also use of Imodium as needed if she gets diarrhea.  Leukocytosis could be related to steroids. Clinically she is asymptomatic and  her right breast wound is very well healed and no evidence of infection noted. However we'll carefully monitor CBC    Next followup visit in 1 week with CBC and differential and CMP  She understood and in agreement with the current plan of care. She knows to call if any questions or concerns arise prior to her next visit   Total time spent: 30 minutes  Wilmon Arms, MD   Medical oncology  02/21/2014 5:53 PM

## 2014-02-21 NOTE — Patient Instructions (Signed)

## 2014-02-21 NOTE — Patient Instructions (Signed)
[facility] Discharge Instructions for Patients Receiving Chemotherapy  Today you received the following chemotherapy agents Herceptin/Perjeta/Taxotere/Carboplatin To help prevent nausea and vomiting after your treatment, we encourage you to take your nausea medication as prescribed.  If you develop nausea and vomiting that is not controlled by your nausea medication, call the clinic.   BELOW ARE SYMPTOMS THAT SHOULD BE REPORTED IMMEDIATELY:  *FEVER GREATER THAN 100.5 F  *CHILLS WITH OR WITHOUT FEVER  NAUSEA AND VOMITING THAT IS NOT CONTROLLED WITH YOUR NAUSEA MEDICATION  *UNUSUAL SHORTNESS OF BREATH  *UNUSUAL BRUISING OR BLEEDING  TENDERNESS IN MOUTH AND THROAT WITH OR WITHOUT PRESENCE OF ULCERS  *URINARY PROBLEMS  *BOWEL PROBLEMS  UNUSUAL RASH Items with * indicate a potential emergency and should be followed up as soon as possible.  Feel free to call the clinic you have any questions or concerns. The clinic phone number is [phone].    

## 2014-02-22 ENCOUNTER — Ambulatory Visit (HOSPITAL_BASED_OUTPATIENT_CLINIC_OR_DEPARTMENT_OTHER): Payer: Medicare Other

## 2014-02-22 ENCOUNTER — Telehealth: Payer: Self-pay | Admitting: *Deleted

## 2014-02-22 VITALS — BP 143/83 | HR 90 | Temp 97.8°F

## 2014-02-22 DIAGNOSIS — C50919 Malignant neoplasm of unspecified site of unspecified female breast: Secondary | ICD-10-CM

## 2014-02-22 DIAGNOSIS — C50311 Malignant neoplasm of lower-inner quadrant of right female breast: Secondary | ICD-10-CM

## 2014-02-22 DIAGNOSIS — Z5189 Encounter for other specified aftercare: Secondary | ICD-10-CM

## 2014-02-22 MED ORDER — PEGFILGRASTIM INJECTION 6 MG/0.6ML
6.0000 mg | Freq: Once | SUBCUTANEOUS | Status: AC
  Administered 2014-02-22: 6 mg via SUBCUTANEOUS
  Filled 2014-02-22: qty 0.6

## 2014-02-22 NOTE — Patient Instructions (Signed)

## 2014-02-22 NOTE — Telephone Encounter (Signed)
[place] here for Neulasta injection following 1st tch/perj chemo treatment.  States doing well   No nausea, vomiting or diarrhea.  Is drinking plenty of fluids and eating some.  All questions answered.  Knows to call if she has any problems or concerns.

## 2014-03-01 ENCOUNTER — Other Ambulatory Visit: Payer: Self-pay

## 2014-03-01 ENCOUNTER — Telehealth: Payer: Self-pay | Admitting: *Deleted

## 2014-03-01 ENCOUNTER — Ambulatory Visit (HOSPITAL_BASED_OUTPATIENT_CLINIC_OR_DEPARTMENT_OTHER): Payer: Medicare Other | Admitting: Physician Assistant

## 2014-03-01 ENCOUNTER — Encounter: Payer: Self-pay | Admitting: Physician Assistant

## 2014-03-01 ENCOUNTER — Telehealth: Payer: Self-pay | Admitting: Physician Assistant

## 2014-03-01 ENCOUNTER — Ambulatory Visit (HOSPITAL_BASED_OUTPATIENT_CLINIC_OR_DEPARTMENT_OTHER): Payer: Medicare Other

## 2014-03-01 VITALS — BP 107/75 | HR 112 | Temp 98.2°F | Resp 18 | Ht 60.0 in | Wt 138.3 lb

## 2014-03-01 DIAGNOSIS — R197 Diarrhea, unspecified: Secondary | ICD-10-CM

## 2014-03-01 DIAGNOSIS — R5381 Other malaise: Secondary | ICD-10-CM

## 2014-03-01 DIAGNOSIS — E860 Dehydration: Secondary | ICD-10-CM

## 2014-03-01 DIAGNOSIS — C50311 Malignant neoplasm of lower-inner quadrant of right female breast: Secondary | ICD-10-CM

## 2014-03-01 DIAGNOSIS — R630 Anorexia: Secondary | ICD-10-CM

## 2014-03-01 DIAGNOSIS — E119 Type 2 diabetes mellitus without complications: Secondary | ICD-10-CM

## 2014-03-01 DIAGNOSIS — R5383 Other fatigue: Secondary | ICD-10-CM

## 2014-03-01 DIAGNOSIS — B370 Candidal stomatitis: Secondary | ICD-10-CM | POA: Insufficient documentation

## 2014-03-01 DIAGNOSIS — E876 Hypokalemia: Secondary | ICD-10-CM

## 2014-03-01 DIAGNOSIS — D696 Thrombocytopenia, unspecified: Secondary | ICD-10-CM

## 2014-03-01 DIAGNOSIS — M069 Rheumatoid arthritis, unspecified: Secondary | ICD-10-CM

## 2014-03-01 DIAGNOSIS — C50919 Malignant neoplasm of unspecified site of unspecified female breast: Secondary | ICD-10-CM

## 2014-03-01 DIAGNOSIS — R634 Abnormal weight loss: Secondary | ICD-10-CM

## 2014-03-01 DIAGNOSIS — M2550 Pain in unspecified joint: Secondary | ICD-10-CM

## 2014-03-01 DIAGNOSIS — Z170 Estrogen receptor positive status [ER+]: Secondary | ICD-10-CM

## 2014-03-01 LAB — CBC WITH DIFFERENTIAL/PLATELET
BASO%: 0.2 % (ref 0.0–2.0)
Basophils Absolute: 0 10*3/uL (ref 0.0–0.1)
EOS%: 0.2 % (ref 0.0–7.0)
Eosinophils Absolute: 0 10*3/uL (ref 0.0–0.5)
HEMATOCRIT: 38.3 % (ref 34.8–46.6)
HGB: 12.7 g/dL (ref 11.6–15.9)
LYMPH%: 15.8 % (ref 14.0–49.7)
MCH: 26.8 pg (ref 25.1–34.0)
MCHC: 33.2 g/dL (ref 31.5–36.0)
MCV: 80.8 fL (ref 79.5–101.0)
MONO#: 2 10*3/uL — AB (ref 0.1–0.9)
MONO%: 12.4 % (ref 0.0–14.0)
NEUT#: 11.6 10*3/uL — ABNORMAL HIGH (ref 1.5–6.5)
NEUT%: 71.4 % (ref 38.4–76.8)
Platelets: 125 10*3/uL — ABNORMAL LOW (ref 145–400)
RBC: 4.74 10*6/uL (ref 3.70–5.45)
RDW: 13.9 % (ref 11.2–14.5)
WBC: 16.2 10*3/uL — AB (ref 3.9–10.3)
lymph#: 2.6 10*3/uL (ref 0.9–3.3)

## 2014-03-01 LAB — COMPREHENSIVE METABOLIC PANEL (CC13)
ALT: 13 U/L (ref 0–55)
AST: 15 U/L (ref 5–34)
Albumin: 3.4 g/dL — ABNORMAL LOW (ref 3.5–5.0)
Alkaline Phosphatase: 106 U/L (ref 40–150)
Anion Gap: 10 mEq/L (ref 3–11)
BUN: 11.2 mg/dL (ref 7.0–26.0)
CO2: 24 mEq/L (ref 22–29)
CREATININE: 0.9 mg/dL (ref 0.6–1.1)
Calcium: 8.9 mg/dL (ref 8.4–10.4)
Chloride: 104 mEq/L (ref 98–109)
Glucose: 109 mg/dl (ref 70–140)
Potassium: 3.4 mEq/L — ABNORMAL LOW (ref 3.5–5.1)
Sodium: 138 mEq/L (ref 136–145)
TOTAL PROTEIN: 6.7 g/dL (ref 6.4–8.3)
Total Bilirubin: 0.33 mg/dL (ref 0.20–1.20)

## 2014-03-01 LAB — MAGNESIUM (CC13): MAGNESIUM: 2.1 mg/dL (ref 1.5–2.5)

## 2014-03-01 MED ORDER — MAGIC MOUTHWASH W/LIDOCAINE: 5.0000 mL | Freq: Four times a day (QID) | ORAL | Status: DC | PRN

## 2014-03-01 MED ORDER — FLUCONAZOLE 100 MG PO TABS: ORAL_TABLET | ORAL | Status: DC

## 2014-03-01 MED ORDER — SODIUM CHLORIDE 0.9 % IJ SOLN
10.0000 mL | INTRAMUSCULAR | Status: DC | PRN
  Administered 2014-03-01: 10 mL via INTRAVENOUS
  Filled 2014-03-01: qty 10

## 2014-03-01 MED ORDER — HEPARIN SOD (PORK) LOCK FLUSH 100 UNIT/ML IV SOLN
500.0000 [IU] | Freq: Once | INTRAVENOUS | Status: AC
Start: 2014-03-01 — End: 2014-03-01
  Administered 2014-03-01: 500 [IU] via INTRAVENOUS
  Filled 2014-03-01: qty 5

## 2014-03-01 MED ORDER — SODIUM CHLORIDE 0.9 % IV SOLN
Freq: Once | INTRAVENOUS | Status: DC
  Administered 2014-03-01: 12:00:00 via INTRAVENOUS

## 2014-03-01 MED ORDER — HEPARIN SOD (PORK) LOCK FLUSH 100 UNIT/ML IV SOLN
500.0000 [IU] | Freq: Once | INTRAVENOUS | Status: AC
  Administered 2014-03-01: 500 [IU] via INTRAVENOUS
  Filled 2014-03-01: qty 5

## 2014-03-01 NOTE — Telephone Encounter (Signed)
per pof to sch pt appt/print pt sch °

## 2014-03-01 NOTE — Telephone Encounter (Signed)
Per staff message and POF I have scheduled appts.  JMW  

## 2014-03-01 NOTE — Progress Notes (Signed)
[facility]  [phone] Fax:([phone]) [phone]     ID: [name] OB: 1951/07/13  MR#: [id number]  ANV#:[id number]  PCP: Salena Saner., MD GYN:   SUFanny Skates OTHER MD: [name], Claire Sanger, Arnoldo Hooker Thimmappa  CHIEF COMPLAINT:  Right Breast Cancer/Adjuvant Chemo   HISTORY OF PRESENT ILLNESS: Mellisa had routine screening mammography at the breast center 09/21/2013 showing a possible mass in calcifications in the right breast. On 10/11/2013 she underwent right diagnostic mammography and ultrasonography. This confirmed a spiculated 1.2 cm mass in the central right breast, with a group of pleomorphic calcifications slightly laterally. The calcifications spanning 1.6 cm. A second group of calcifications extended inferiorly and spanned 8 mm. The entire area in aggregate measured 6.5 cm. By physical exam there was no palpable abnormality. Ultrasonography showed an irregular hypoechoic mass at the 6:00 position of the right breast measuring 1 cm maximally. The right axilla was normal.  Biopsy of the right breast mass in question as well as the more lateral area of calcifications on 10/11/2013 showed ([id number]) most to be positive for an invasive ductal carcinoma, both estrogen receptor 85-90% positive with strong staining intensity, both progesterone receptor negative, with an MIB-1 of 17%. HER-2 was amplified, with a signals ratio of 3.44 and a copy number per cell 04 0.30.  Bilateral breast MRI 10/19/2013 showed again a spiculated mass in the 6:00 region of the right breast measuring 1.2 cm, a slightly more lateral hematoma associated with a second biopsy and also with clumped enhancement, and asymmetric none masslike enhancement in most of the right breast, the entire area of abnormality measuring 9.0 cm. The left breast was unremarkable and there were no abnormal appearing lymph nodes.  Her subsequent history is as detailed below    INTERVAL  HISTORY: Casimira returns alone today for followup of her right breast cancer. She is currently receiving adjuvant chemotherapy, with today being day 9 cycle 1 of 6 planned q. three-week doses of  docetaxel/carboplatin given with trastuzumab/pertuzumab.  She receives Neulasta on day 2 for granulocyte support.   Laurann has had several difficult to days. She but reasonably well for the first few days after chemotherapy, but on Sunday (day 6 of the first cycle) she began to have taste aversion, nausea, and emesis. She began having difficulty eating or drinking at that time. She ate ice cream and try drinking milk on Sunday, and tells me today "soured" in her stomach and caused vomiting. By Sunday night, she began having loose stools. These have slowed down somewhat. She had 2 loose stools yesterday, and has had only one today thus far. There's been no blood or mucus in the stool. She's had no abdominal pain. She denies any fevers or chills. She does have oral sensitivity although she denies any ulcerations. Her throat feel sore.  I will mention that she has also lost a significant amount of weight since her initial diagnosis, down from approximately 71 kg in January, to 62.7 kg today.  REVIEW OF SYSTEMS: Elsbeth denies any fevers, chills, or night sweats. She's had no rashes or skin changes. She is extremely weak and fatigued today with signs of dehydration. Her blood pressure today was 107/75, which is quite low for her. She currently denies any nausea. She has had no emesis since Sunday. In fact, she tells me she actually feels hungry, but is unable to eat due to the oral sensitivity, sore throat, and "foul taste".   She denies any change in  urinary habits, neither dysuria or hematuria. She's had no cough, phlegm production, increased shortness of breath, orthopnea, peripheral swelling, chest pain, or palpitations. She denies any abnormal headaches, dizziness, or change in vision. Her joint pain has been  well-controlled despite her history of rheumatoid arthritis. She denies any new or unusual myalgias, arthralgias, or bony pain. She has already started to feel some numbness and dullness in her toes bilaterally, and tells me this has been present "all the time" for the past 2 days. She's had no neuropathy thus far in the upper extremities.  A detailed review of systems is otherwise stable and noncontributory.   PAST MEDICAL HISTORY: Past Medical History  Diagnosis Date  . Raynaud disease   . Osteoporosis   . Hypertension   . PONV (postoperative nausea and vomiting)   . Wears glasses   . Full dentures   . Fibromyalgia   . Rheumatoid arthritis     "elbows; fingers; toes"  . Osteoarthritis of both knees   . Degenerative disc disease, cervical   . Chronic left shoulder pain     "work related injury" (01/10/2014)  . Breast cancer     "right" (01/10/2014)  . Type II diabetes mellitus     has not taken meds in over a month (01/10/2014)  . Syncope and collapse     "4 times in the last year" (01/10/2014)    PAST SURGICAL HISTORY: Past Surgical History  Procedure Laterality Date  . Colonoscopy    . Mastectomy w/ sentinel node biopsy Right 12/13/2013    Procedure: RIGHT TOTAL MASTECTOMY WITH SENTINEL LYMPH NODE BIOPSY;  Surgeon: Adin Hector, MD;  Location: [facility];  Service: General;  Laterality: Right;  . Portacath placement Right 12/13/2013    Procedure: INSERTION PORT-A-CATH;  Surgeon: Adin Hector, MD;  Location: [facility];  Service: General;  Laterality: Right;  . Breast reconstruction with placement of tissue expander and flex hd (acellular hydrated dermis) Right 12/13/2013    Procedure: RIGHT BREAST RECONSTRUCTION WITH PLACEMENT OF TISSUE EXPANDER AND FLEX HD (ACELLULAR HYDRATED DERMIS);  Surgeon: Irene Limbo, MD;  Location: [facility];  Service: Plastics;  Laterality: Right;  . Lipoma excision Bilateral 12/13/2013    Procedure:  EXCISION OF LEFT BREAST KELOID AND RIGHT CHEST KELOID;  Surgeon: Irene Limbo, MD;  Location: [facility];  Service: Plastics;  Laterality: Bilateral;  . Mastectomy    . Cholecystectomy  1970's  . Vaginal hysterectomy  1980    no salpingo-oophoretomu  . Breast biopsy Right 10/2013  . Tissue expander placement Right 01/10/2014    Procedure: Incision and Drainage Right Breast Seroma with TISSUE EXPANDER exchange;  Surgeon: Irene Limbo, MD;  Location: [facility];  Service: Plastics;  Laterality: Right;    FAMILY HISTORY History reviewed. No pertinent family history. The patient is little information about her father. Her mother died from a heart attack at the age of 31. She had been diagnosed with breast cancer in her late 8s. The patient had 4 brothers and 4 sisters. There is no other history of breast or ovarian cancer in the family to her knowledge.  GYNECOLOGIC HISTORY:    (Reviewed 03/01/2014) She does not recall her age of menarche. She underwent hysterectomy in her 18s. She did not receive hormone replacement. First live birth at 5. She was GX P2.  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 She is currently disabled secondary to her rheumatoid arthritis. Her husband Labrisha Wuellner is a retired Administrator. He now works part time at [employer]. Son [name] works in [place] as a Patent attorney. Daughter [name] is a Social worker.    ADVANCED DIRECTIVES: Not in place   HEALTH MAINTENANCE: (Updated 03/01/2014) History  Substance Use Topics  . Smoking status: Never Smoker   . Smokeless tobacco: Never Used  . Alcohol Use: No     Colonoscopy: Not on file  PAP: Status post remote hysterectomy  Bone density: At [facility] hospital 01/05/2009 showed osteopenia with a T score of -1.7  Lipid panel: Not on file   Allergies  Allergen Reactions  . Codeine  Other (See Comments) and Hives    Altered mental status, numbness  Altered mental status, Numbess  . Diazepam     Other reaction(s): Delusions (intolerance) hallucinations     Current Outpatient Prescriptions  Medication Sig Dispense Refill  . cyclobenzaprine (FLEXERIL) 10 MG tablet Take 10 mg by mouth 3 (three) times daily as needed for muscle spasms.      Marland Kitchen dexamethasone (DECADRON) 4 MG tablet 2 tabs by mouth with food twice daily on day before and 3 days after each chemo  30 tablet  1  . lidocaine-prilocaine (EMLA) cream Apply 1 application topically as needed. 1-2 hrs before port access  30 g  4  . LORazepam (ATIVAN) 0.5 MG tablet Take 0.5-1 tablets (0.25-0.5 mg total) by mouth at bedtime as needed for anxiety or sleep (or nausea).  30 tablet  0  . ondansetron (ZOFRAN) 8 MG tablet Take 8 mg by mouth See admin instructions. 1 tab by mouth twice daily x 3 days after each chemo, then 1 tab by mouth every 12 hrs if needed for nausea      . oxyCODONE (ROXICODONE) 5 MG immediate release tablet Take 1 tablet (5 mg total) by mouth every 4 (four) hours as needed for severe pain.  40 tablet  0  . prochlorperazine (COMPAZINE) 10 MG tablet Take 10 mg by mouth once. 1 tab by mouth with meals and at bedtime x 3 days after each chemo, then 1 tab by mouth every 6 hrs if needed for nausea      . traMADol (ULTRAM) 50 MG tablet Take 50 mg by mouth 3 (three) times daily as needed (pain).       . Vitamin D, Ergocalciferol, (DRISDOL) 50000 UNITS CAPS capsule Take 50,000 Units by mouth every 7 (seven) days. Wednesdays      . Alum & Mag Hydroxide-Simeth (MAGIC MOUTHWASH W/LIDOCAINE) SOLN Take 5 mLs by mouth 4 (four) times daily as needed for mouth pain. Gargle, swish and spit  360 mL  1  . fluconazole (DIFLUCAN) 100 MG tablet 2 tabs by mouth x 1 day, then 1 tab by mouth daily - total of 7 days  8 tablet  5  . Liraglutide (VICTOZA) 18 MG/3ML SOPN Inject 0.6 mg into the skin daily.       . meloxicam (MOBIC) 7.5 MG  tablet Take 1 tablet (7.5 mg total) by mouth daily.  10 tablet  0  . mupirocin ointment (BACTROBAN) 2 % Place 1 application into the nose 2 (two) times daily.  22 g  0   No current facility-administered medications for this visit.   Facility-Administered Medications Ordered in Other Visits  Medication Dose Route Frequency Provider Last Rate Last Dose  . heparin lock flush 100 unit/mL  500 Units Intravenous Once Jordell Outten G Cassiopeia Florentino, PA-C      . sodium chloride 0.9 % injection 10 mL  10 mL Intravenous PRN Wilmon Arms, MD   10 mL at 02/21/14 1759  . sodium chloride 0.9 % injection 10 mL  10 mL Intravenous PRN Damali Broadfoot G Joplin Canty, PA-C      . sodium chloride 0.9 % injection 10 mL  10 mL Intravenous PRN Chauncey Cruel, MD   10 mL at 03/01/14 0934    OBJECTIVE:   Filed Vitals:   03/01/14 0948  BP: 107/75  Pulse: 112  Temp: 98.2 F (36.8 C)  Resp: 18     Body mass index is 27.01 kg/(m^2).    ECOG FS: 1 [name] who appears weak and tired. Filed Weights   03/01/14 0948  Weight: 138 lb 4.8 oz (62.732 kg)   Physical Exam: HEENT:  Sclerae anicteric.  Oropharynx is dry. Tongue is red and "beefy". There are visible white patches in the posterior buccal mucosa consistent with candidiasis. No ulcerations are noted. Angular cheilitis bilaterally. Pharyngeal erythema, but no exudate. Neck supple, trachea midline.  NODES:  No cervical or supraclavicular lymphadenopathy palpated.  BREAST EXAM: Breast exam was deferred. Axillae are benign bilaterally, with no palpable lymphadenopathy. LUNGS:  Clear to auscultation bilaterally with good excursion.  No wheezes or rhonchi HEART:  Regular rate and rhythm. No murmur. ABDOMEN:  Soft, nontender.  Positive, normoactive bowel sounds.  MSK:  No focal spinal tenderness to palpation. Good range of motion bilaterally in the upper extremities. EXTREMITIES:  No peripheral edema.  No lymphedema in the right upper extremity. SKIN:  Benign with no  visible rashes. No nail dyscrasia. No excessive ecchymoses. No petechiae. No pallor. Skin turgor is fair. NEURO:  Nonfocal. Well oriented.  Fatigued affect.      LAB RESULTS:    Lab Results  Component Value Date   WBC 16.2* 03/01/2014   NEUTROABS 11.6* 03/01/2014   HGB 12.7 03/01/2014   HCT 38.3 03/01/2014   MCV 80.8 03/01/2014   PLT 125* 03/01/2014      Chemistry      Component Value Date/Time   NA 138 03/01/2014 0924   NA 138 01/09/2014 1522   K 3.4* 03/01/2014 0924   K 4.2 01/09/2014 1522   CL 100 01/09/2014 1522   CO2 24 03/01/2014 0924   CO2 25 01/09/2014 1522   BUN 11.2 03/01/2014 0924   BUN 11 01/09/2014 1522   CREATININE 0.9 03/01/2014 0924   CREATININE 0.99 01/09/2014 1522      Component Value Date/Time   CALCIUM 8.9 03/01/2014 0924   CALCIUM 8.5 01/09/2014 1522   ALKPHOS 106 03/01/2014 0924   ALKPHOS 102 12/09/2013 1400   AST 15 03/01/2014 0924   AST 36 12/09/2013 1400   ALT 13 03/01/2014 0924   ALT 30 12/09/2013 1400   BILITOT 0.33 03/01/2014 0924   BILITOT 0.2* 12/09/2013 1400       STUDIES:  Echocardiogram on 12/20/2013 showed an ejection fraction of 55-60%.  DG CHEST PORT 1 VIEW 03/03/215   CLINICAL DATA: Port-A-Cath placement.  EXAM:  PORTABLE CHEST - 1 VIEW  COMPARISON: 12/09/2013  FINDINGS:  There has been placement of a right subclavian Port-A-Cath which has  tip at the cavoatrial junction. No definite right pneumothorax.  There is a small caliber catheter projected over the right chest  with tip just right of midline at the level of the hilum which may  be related  to patient's recent right breast surgery. Lungs are  somewhat hypoinflated with minimal prominence of the perihilar  markings suggesting minimal vascular congestion. There is subtle  opacification over the lateral left base likely atelectasis.  Cardiomediastinal silhouette is within normal. There is a small  right cervical rib. There are surgical clips over the right axilla.  Remainder of the  exam is unchanged.  IMPRESSION:  Minimal prominence of the perihilar markings suggesting minimal  vascular congestion. Possible minimal left basilar atelectasis.  Right subclavian Port-A-Cath with tip at the region of the  cavoatrial junction. No definite pneumothorax.  Small caliber catheter over the right chest with tip just right of  midline as this may represent a surgical drain related to patient's  recent breast surgery.  Electronically Signed  By: Marin Olp M.D.  On: 12/13/2013 13:27    ASSESSMENT: 64 y.o. [place] woman   (1)  s/p biopsy of separate Right breast masses 10/11/2013 for a clinical mT1 N0, stage IA invasive ductal carcinoma, grade II-III, one of the mass is being 85% estrogen receptor positive, progesterone receptor negative, with an MIB-1 of 17% and HER-2 amplified.  (2) status post right mastectomy and sentinel lymph node sampling with immediate expander placement 12/13/2013 for a pT1c pN0 invasive ductal carcinoma, grade 3, again HER-2 positive.  (3) Being treated in the adjuvant setting with carboplatin, docetaxel, trastuzumab and pertuzumab, first dose on 02/21/2014. Neulasta on day 2 for granulocyte support. The plan is to repeat these agents every 21 days x6, after which the anti-HER-2 treatment (both trastuzumab and pertuzumab) will be continued to the total one year.  (4) The patient will not need postmastectomy radiation, so antiestrogen therapy will be started as soon as chemotherapy is completed  (5) Rheumatoid arthritis, usually on Remicade, prednisone and methotrexate, held prior to mastectomy.  (6)  Cellulitis affecting the right reconstructed breast diagnosed at 01/09/2014 with subsequent surgery to drain the seroma and exchange tissue expander - Resolved  PLAN: Raymona is going to receive supportive IV fluids today for her dehydration. I think one of her biggest problems is severe oropharyngeal candidiasis which we will treat with a combination  of oral fluconazole and magic mouthwash for at least  7 days. I think that once her mouth feels better, she will have an easier time eating and drinking. She has, however, lost quite a bit of weight since her original diagnosis, and we will also request a nutritional consult.  We also discussed starting with a mild, bland diet such as the BRAT diet and to avoid dairy products at this time. This should also help decrease her diarrhea which actually seems to be reasonably well controlled at this time. I did advise her to use Imodium as needed if the diarrhea continues, but if she has more than 3 or 4 loose stools in a day, she needs to contact us immediately for further direction.   Melany is scheduled to return on June 2 for repeat labs and physical exam prior to cycle 2.  I have asked her to contact us in the next couple of days with an update on how she is feeling, and certainly she should call us immediately if any of her symptoms worsen.  We will also need to follow her very closely for peripheral neuropathy which seems to be developing already in the lower extremities.  The above plan was reviewed in detail with [place] today. She voices her understanding and agreement with our plan, and will call with any changes  or problems.   Theotis Burrow, PA-C   03/01/2014 1:08 PM

## 2014-03-01 NOTE — Patient Instructions (Signed)

## 2014-03-01 NOTE — Patient Instructions (Signed)
Dehydration, Adult Dehydration is when you lose more fluids from the body than you take in. Vital organs like the kidneys, brain, and heart cannot function without a proper amount of fluids and salt. Any loss of fluids from the body can cause dehydration.  CAUSES   Vomiting.  Diarrhea.  Excessive sweating.  Excessive urine output.  Fever. SYMPTOMS  Mild dehydration  Thirst.  Dry lips.  Slightly dry mouth. Moderate dehydration  Very dry mouth.  Sunken eyes.  Skin does not bounce back quickly when lightly pinched and released.  Dark urine and decreased urine production.  Decreased tear production.  Headache. Severe dehydration  Very dry mouth.  Extreme thirst.  Rapid, weak pulse (more than 100 beats per minute at rest).  Cold hands and feet.  Not able to sweat in spite of heat and temperature.  Rapid breathing.  Blue lips.  Confusion and lethargy.  Difficulty being awakened.  Minimal urine production.  No tears. DIAGNOSIS  Your caregiver will diagnose dehydration based on your symptoms and your exam. Blood and urine tests will help confirm the diagnosis. The diagnostic evaluation should also identify the cause of dehydration. TREATMENT  Treatment of mild or moderate dehydration can often be done at home by increasing the amount of fluids that you drink. It is best to drink small amounts of fluid more often. Drinking too much at one time can make vomiting worse. Refer to the home care instructions below. Severe dehydration needs to be treated at the hospital where you will probably be given intravenous (IV) fluids that contain water and electrolytes. HOME CARE INSTRUCTIONS   Ask your caregiver about specific rehydration instructions.  Drink enough fluids to keep your urine clear or pale yellow.  Drink small amounts frequently if you have nausea and vomiting.  Eat as you normally do.  Avoid:  Foods or drinks high in sugar.  Carbonated  drinks.  Juice.  Extremely hot or cold fluids.  Drinks with caffeine.  Fatty, greasy foods.  Alcohol.  Tobacco.  Overeating.  Gelatin desserts.  Wash your hands well to avoid spreading bacteria and viruses.  Only take over-the-counter or prescription medicines for pain, discomfort, or fever as directed by your caregiver.  Ask your caregiver if you should continue all prescribed and over-the-counter medicines.  Keep all follow-up appointments with your caregiver. SEEK MEDICAL CARE IF:  You have abdominal pain and it increases or stays in one area (localizes).  You have a rash, stiff neck, or severe headache.  You are irritable, sleepy, or difficult to awaken.  You are weak, dizzy, or extremely thirsty. SEEK IMMEDIATE MEDICAL CARE IF:   You are unable to keep fluids down or you get worse despite treatment.  You have frequent episodes of vomiting or diarrhea.  You have blood or green matter (bile) in your vomit.  You have blood in your stool or your stool looks black and tarry.  You have not urinated in 6 to 8 hours, or you have only urinated a small amount of very dark urine.  You have a fever.  You faint. MAKE SURE YOU:   Understand these instructions.  Will watch your condition.  Will get help right away if you are not doing well or get worse. Document Released: 09/29/2005 Document Revised: 12/22/2011 Document Reviewed: 05/19/2011 Exit[employer] Patient Information 2014 [employer].  

## 2014-03-02 ENCOUNTER — Ambulatory Visit (HOSPITAL_BASED_OUTPATIENT_CLINIC_OR_DEPARTMENT_OTHER): Payer: Medicare Other

## 2014-03-02 ENCOUNTER — Other Ambulatory Visit: Payer: Self-pay | Admitting: Physician Assistant

## 2014-03-02 ENCOUNTER — Telehealth: Payer: Self-pay | Admitting: *Deleted

## 2014-03-02 ENCOUNTER — Telehealth: Payer: Self-pay | Admitting: Physician Assistant

## 2014-03-02 VITALS — BP 145/89 | HR 115 | Temp 99.0°F | Resp 18

## 2014-03-02 DIAGNOSIS — C50311 Malignant neoplasm of lower-inner quadrant of right female breast: Secondary | ICD-10-CM

## 2014-03-02 DIAGNOSIS — E860 Dehydration: Secondary | ICD-10-CM

## 2014-03-02 DIAGNOSIS — R197 Diarrhea, unspecified: Secondary | ICD-10-CM

## 2014-03-02 MED ORDER — SODIUM CHLORIDE 0.9 % IJ SOLN
10.0000 mL | INTRAMUSCULAR | Status: DC | PRN
  Administered 2014-03-02: 10 mL via INTRAVENOUS
  Filled 2014-03-02: qty 10

## 2014-03-02 MED ORDER — CHOLESTYRAMINE 4 G PO PACK: 4.0000 g | PACK | Freq: Two times a day (BID) | ORAL | Status: DC

## 2014-03-02 MED ORDER — SODIUM CHLORIDE 0.9 % IV SOLN
INTRAVENOUS | Status: DC
  Administered 2014-03-02: 16:00:00 via INTRAVENOUS

## 2014-03-02 MED ORDER — HEPARIN SOD (PORK) LOCK FLUSH 100 UNIT/ML IV SOLN
500.0000 [IU] | Freq: Once | INTRAVENOUS | Status: AC
  Administered 2014-03-02: 500 [IU] via INTRAVENOUS
  Filled 2014-03-02: qty 5

## 2014-03-02 NOTE — Telephone Encounter (Signed)
9:03 Verbal order received and read back from [name] PA-C for patient to come in for 1-2 hours of IVF.  Questran packets twice daily (breakfast and dinner) mixed with applesauce.  Called mobile number provided by patient.  Order information left on voicemail at [phone].  Requesting return call   At 0930 called home number [phone].  Female asked that this nurse call back as she is unable to come to phone. 0955 spoke with patient.  Appointment information given.  Questran instructions given to use in place of immodium.  Reports loose stool at 0930.  Instructed to proceed with imodium.

## 2014-03-02 NOTE — Telephone Encounter (Signed)
per pof to sch nut appt ASAP-1st avai 6/4-cld & spoke to pt & gave time 7 date of appt

## 2014-03-02 NOTE — Telephone Encounter (Signed)
   Provider input needed: Diarrhea   Reason for call: Continued weakness, tired, decreased po intake  Constitutional: positive for fatigue and weakness Gastrointestinal: positive for diarrhea and stomach pain   ALLERGIES:  is allergic to codeine and diazepam.  Patient last received chemotherapy/ treatment on 02-21-2014 first carbo/herceptin/perjetta/taxotere  Patient was last seen in the office on 03-01-2014  Next appt is 03-14-2014  Is patient having fevers greater than 100.5?  no   Is patient having uncontrolled pain, or new pain? yes, stomach pain with bowel movements and sips of beverages.   Is patient having new back pain that changes with position (worsens or eases when laying down?)  no   Is patient able to eat and drink? yes, "very small bites and sips with BRAT diet started yesterday"    Is patient able to pass stool without difficulty?   yes, three loose diarrhea stools after IVF yesterday.  Imodium used.  No further stools.     Is patient having uncontrolled nausea?  no, denies n/v at this time.    patient calls 03/02/2014 with complaint of  Constitutional: positive for fatigue and weakness Gastrointestinal: positive for diarrhea "I don't feel any better.  Calling as instructed by [name] so I don't go into the weekend feeling poorly.  I am not able to eat or drink much and with every sip my stomach hurts.     Summary Based on the above information advised patient to  Await return call.  This nurse will notify providers and call with any new orders.   Angela Gross  03/02/2014, 9:36 AM   Background Info  Angela Gross   DOB: Nov 01, 1950   MR#: [id number]   CSN#   [id number] 03/02/2014

## 2014-03-02 NOTE — Patient Instructions (Signed)
Dehydration, Adult Dehydration is when you lose more fluids from the body than you take in. Vital organs like the kidneys, brain, and heart cannot function without a proper amount of fluids and salt. Any loss of fluids from the body can cause dehydration.  CAUSES   Vomiting.  Diarrhea.  Excessive sweating.  Excessive urine output.  Fever. SYMPTOMS  Mild dehydration  Thirst.  Dry lips.  Slightly dry mouth. Moderate dehydration  Very dry mouth.  Sunken eyes.  Skin does not bounce back quickly when lightly pinched and released.  Dark urine and decreased urine production.  Decreased tear production.  Headache. Severe dehydration  Very dry mouth.  Extreme thirst.  Rapid, weak pulse (more than 100 beats per minute at rest).  Cold hands and feet.  Not able to sweat in spite of heat and temperature.  Rapid breathing.  Blue lips.  Confusion and lethargy.  Difficulty being awakened.  Minimal urine production.  No tears. DIAGNOSIS  Your caregiver will diagnose dehydration based on your symptoms and your exam. Blood and urine tests will help confirm the diagnosis. The diagnostic evaluation should also identify the cause of dehydration. TREATMENT  Treatment of mild or moderate dehydration can often be done at home by increasing the amount of fluids that you drink. It is best to drink small amounts of fluid more often. Drinking too much at one time can make vomiting worse. Refer to the home care instructions below. Severe dehydration needs to be treated at the hospital where you will probably be given intravenous (IV) fluids that contain water and electrolytes. HOME CARE INSTRUCTIONS   Ask your caregiver about specific rehydration instructions.  Drink enough fluids to keep your urine clear or pale yellow.  Drink small amounts frequently if you have nausea and vomiting.  Eat as you normally do.  Avoid:  Foods or drinks high in sugar.  Carbonated  drinks.  Juice.  Extremely hot or cold fluids.  Drinks with caffeine.  Fatty, greasy foods.  Alcohol.  Tobacco.  Overeating.  Gelatin desserts.  Wash your hands well to avoid spreading bacteria and viruses.  Only take over-the-counter or prescription medicines for pain, discomfort, or fever as directed by your caregiver.  Ask your caregiver if you should continue all prescribed and over-the-counter medicines.  Keep all follow-up appointments with your caregiver. SEEK MEDICAL CARE IF:  You have abdominal pain and it increases or stays in one area (localizes).  You have a rash, stiff neck, or severe headache.  You are irritable, sleepy, or difficult to awaken.  You are weak, dizzy, or extremely thirsty. SEEK IMMEDIATE MEDICAL CARE IF:   You are unable to keep fluids down or you get worse despite treatment.  You have frequent episodes of vomiting or diarrhea.  You have blood or green matter (bile) in your vomit.  You have blood in your stool or your stool looks black and tarry.  You have not urinated in 6 to 8 hours, or you have only urinated a small amount of very dark urine.  You have a fever.  You faint. MAKE SURE YOU:   Understand these instructions.  Will watch your condition.  Will get help right away if you are not doing well or get worse. Document Released: 09/29/2005 Document Revised: 12/22/2011 Document Reviewed: 05/19/2011 Exit[employer] Patient Information 2014 [employer].  

## 2014-03-07 ENCOUNTER — Other Ambulatory Visit: Payer: Medicare Other

## 2014-03-07 ENCOUNTER — Ambulatory Visit: Payer: Medicare Other | Admitting: Physician Assistant

## 2014-03-08 ENCOUNTER — Ambulatory Visit: Payer: Medicare Other

## 2014-03-14 ENCOUNTER — Telehealth: Payer: Self-pay | Admitting: Physician Assistant

## 2014-03-14 ENCOUNTER — Ambulatory Visit (HOSPITAL_BASED_OUTPATIENT_CLINIC_OR_DEPARTMENT_OTHER): Payer: Medicare Other

## 2014-03-14 ENCOUNTER — Other Ambulatory Visit (HOSPITAL_BASED_OUTPATIENT_CLINIC_OR_DEPARTMENT_OTHER): Payer: Medicare Other

## 2014-03-14 ENCOUNTER — Encounter: Payer: Self-pay | Admitting: Physician Assistant

## 2014-03-14 ENCOUNTER — Ambulatory Visit (HOSPITAL_BASED_OUTPATIENT_CLINIC_OR_DEPARTMENT_OTHER): Payer: Medicare Other | Admitting: Physician Assistant

## 2014-03-14 ENCOUNTER — Ambulatory Visit: Payer: Medicare Other | Admitting: Physician Assistant

## 2014-03-14 ENCOUNTER — Encounter: Payer: Self-pay | Admitting: Oncology

## 2014-03-14 ENCOUNTER — Other Ambulatory Visit: Payer: Medicare Other

## 2014-03-14 VITALS — BP 137/87 | HR 109 | Temp 98.5°F | Resp 18 | Ht 60.0 in | Wt 145.2 lb

## 2014-03-14 DIAGNOSIS — Z95828 Presence of other vascular implants and grafts: Secondary | ICD-10-CM

## 2014-03-14 DIAGNOSIS — R197 Diarrhea, unspecified: Secondary | ICD-10-CM

## 2014-03-14 DIAGNOSIS — C50311 Malignant neoplasm of lower-inner quadrant of right female breast: Secondary | ICD-10-CM

## 2014-03-14 DIAGNOSIS — C50919 Malignant neoplasm of unspecified site of unspecified female breast: Secondary | ICD-10-CM

## 2014-03-14 DIAGNOSIS — G620 Drug-induced polyneuropathy: Secondary | ICD-10-CM

## 2014-03-14 DIAGNOSIS — M2550 Pain in unspecified joint: Secondary | ICD-10-CM

## 2014-03-14 DIAGNOSIS — Z5112 Encounter for antineoplastic immunotherapy: Secondary | ICD-10-CM

## 2014-03-14 DIAGNOSIS — T451X5A Adverse effect of antineoplastic and immunosuppressive drugs, initial encounter: Secondary | ICD-10-CM

## 2014-03-14 DIAGNOSIS — D649 Anemia, unspecified: Secondary | ICD-10-CM | POA: Insufficient documentation

## 2014-03-14 DIAGNOSIS — Z170 Estrogen receptor positive status [ER+]: Secondary | ICD-10-CM

## 2014-03-14 LAB — CBC WITH DIFFERENTIAL/PLATELET
BASO%: 0.2 % (ref 0.0–2.0)
Basophils Absolute: 0.1 10*3/uL (ref 0.0–0.1)
EOS ABS: 0 10*3/uL (ref 0.0–0.5)
EOS%: 0 % (ref 0.0–7.0)
HCT: 33.3 % — ABNORMAL LOW (ref 34.8–46.6)
HEMOGLOBIN: 10.7 g/dL — AB (ref 11.6–15.9)
LYMPH%: 5.3 % — ABNORMAL LOW (ref 14.0–49.7)
MCH: 26.6 pg (ref 25.1–34.0)
MCHC: 32.2 g/dL (ref 31.5–36.0)
MCV: 82.7 fL (ref 79.5–101.0)
MONO#: 1.1 10*3/uL — ABNORMAL HIGH (ref 0.1–0.9)
MONO%: 4.2 % (ref 0.0–14.0)
NEUT%: 90.3 % — ABNORMAL HIGH (ref 38.4–76.8)
NEUTROS ABS: 24 10*3/uL — AB (ref 1.5–6.5)
Platelets: 200 10*3/uL (ref 145–400)
RBC: 4.03 10*6/uL (ref 3.70–5.45)
RDW: 14.8 % — AB (ref 11.2–14.5)
WBC: 26.6 10*3/uL — ABNORMAL HIGH (ref 3.9–10.3)
lymph#: 1.4 10*3/uL (ref 0.9–3.3)

## 2014-03-14 MED ORDER — SODIUM CHLORIDE 0.9 % IJ SOLN
10.0000 mL | INTRAMUSCULAR | Status: DC | PRN
  Administered 2014-03-14: 10 mL via INTRAVENOUS
  Filled 2014-03-14: qty 10

## 2014-03-14 MED ORDER — DIPHENHYDRAMINE HCL 25 MG PO CAPS
ORAL_CAPSULE | ORAL | Status: AC
  Filled 2014-03-14: qty 1

## 2014-03-14 MED ORDER — ACETAMINOPHEN 325 MG PO TABS
650.0000 mg | ORAL_TABLET | Freq: Once | ORAL | Status: AC
  Administered 2014-03-14: 650 mg via ORAL

## 2014-03-14 MED ORDER — TRASTUZUMAB CHEMO INJECTION 440 MG
6.0000 mg/kg | Freq: Once | INTRAVENOUS | Status: AC
  Administered 2014-03-14: 420 mg via INTRAVENOUS
  Filled 2014-03-14: qty 20

## 2014-03-14 MED ORDER — SODIUM CHLORIDE 0.9 % IV SOLN
Freq: Once | INTRAVENOUS | Status: AC
  Administered 2014-03-14: 13:00:00 via INTRAVENOUS

## 2014-03-14 MED ORDER — HEPARIN SOD (PORK) LOCK FLUSH 100 UNIT/ML IV SOLN
500.0000 [IU] | Freq: Once | INTRAVENOUS | Status: AC
  Administered 2014-03-14: 500 [IU] via INTRAVENOUS
  Filled 2014-03-14: qty 5

## 2014-03-14 MED ORDER — SODIUM CHLORIDE 0.9 % IJ SOLN
10.0000 mL | INTRAMUSCULAR | Status: DC | PRN
  Administered 2014-03-14: 10 mL
  Filled 2014-03-14: qty 10

## 2014-03-14 MED ORDER — ACETAMINOPHEN 325 MG PO TABS
ORAL_TABLET | ORAL | Status: AC
  Filled 2014-03-14: qty 2

## 2014-03-14 MED ORDER — HEPARIN SOD (PORK) LOCK FLUSH 100 UNIT/ML IV SOLN
500.0000 [IU] | Freq: Once | INTRAVENOUS | Status: AC | PRN
  Administered 2014-03-14: 500 [IU]
  Filled 2014-03-14: qty 5

## 2014-03-14 MED ORDER — SODIUM CHLORIDE 0.9 % IV SOLN
420.0000 mg | Freq: Once | INTRAVENOUS | Status: AC
  Administered 2014-03-14: 420 mg via INTRAVENOUS
  Filled 2014-03-14: qty 14

## 2014-03-14 MED ORDER — DIPHENHYDRAMINE HCL 25 MG PO CAPS
25.0000 mg | ORAL_CAPSULE | Freq: Once | ORAL | Status: AC
  Administered 2014-03-14: 25 mg via ORAL

## 2014-03-14 NOTE — Telephone Encounter (Signed)
cld pt to adv per pof AB cancel inj for 6/3-adv pt to call if any questions [phone]

## 2014-03-14 NOTE — Progress Notes (Signed)
[place]  [phone] [phone]) [phone]     ID: Angela Gross OB: 02/09/1958  MR#: [id number]  UKG#:[id number]  PCP: Salena Saner., MD GYN:   SUFanny Skates OTHER MD: [name], Claire Sanger, Arnoldo Hooker Thimmappa  CHIEF COMPLAINT:  Right Breast Cancer/Adjuvant Chemo   HISTORY OF PRESENT ILLNESS: Angela Gross had routine screening mammography at the breast center 09/21/2013 showing a possible mass in calcifications in the right breast. On 10/11/2013 she underwent right diagnostic mammography and ultrasonography. This confirmed a spiculated 1.2 cm mass in the central right breast, with a group of pleomorphic calcifications slightly laterally. The calcifications spanning 1.6 cm. A second group of calcifications extended inferiorly and spanned 8 mm. The entire area in aggregate measured 6.5 cm. By physical exam there was no palpable abnormality. Ultrasonography showed an irregular hypoechoic mass at the 6:00 position of the right breast measuring 1 cm maximally. The right axilla was normal.  Biopsy of the right breast mass in question as well as the more lateral area of calcifications on 10/11/2013 showed ([id number]) most to be positive for an invasive ductal carcinoma, both estrogen receptor 85-90% positive with strong staining intensity, both progesterone receptor negative, with an MIB-1 of 17%. HER-2 was amplified, with a signals ratio of 3.44 and a copy number per cell 04 0.30.  Bilateral breast MRI 10/19/2013 showed again a spiculated mass in the 6:00 region of the right breast measuring 1.2 cm, a slightly more lateral hematoma associated with a second biopsy and also with clumped enhancement, and asymmetric none masslike enhancement in most of the right breast, the entire area of abnormality measuring 9.0 cm. The left breast was unremarkable and there were no abnormal appearing lymph nodes.  Her subsequent history is as detailed below    INTERVAL  HISTORY: [name] returns today accompanied by her daughter for followup of her right breast cancer. She is currently receiving adjuvant chemotherapy, and is due for day 1 cycle 2 of 6 planned q. three-week doses of  docetaxel/carboplatin given with trastuzumab/pertuzumab.  She receives Neulasta on day 2 for granulocyte support.   [name] had a very difficult time tolerating cycle 1, although fortunately she is beginning to feel better. She receives supportive IV fluids following cycle one, but is not eating and drinking and keeping herself well hydrated on her own. Her appetite has improved. She has gained her weight back. She's had no additional nausea or emesis, and is having regular bowel movements. The thrush has also resolved, so to taste aversion has improved as well.   Of biggest concern today is the fact that she continues to have some significant signs of peripheral neuropathy affecting both the upper and lower extremities. This was present after only one dose of docetaxel 3 weeks ago, and it has not improved. It does affect both the upper and lower extremities, and both the right and left sides. It is present all the time and she describes the sensation as a dull ache and the feeling of numbness. She's having problems with fine motor skills like buttoning a blouse or putting her earrings on. She finds herself dropping things.  With her history of rheumatoid arthritis affecting both hands and feet, this has only added to the pain and discomfort she typically experiences.  REVIEW OF SYSTEMS: [name] denies any fevers, chills, or night sweats, but does have hot flashes. She's had no rashes,  skin changes, or nail bed changes. Although she is less weak than she was 2 weeks  ago, she is still fatigued and admits that she is still only doing about 20% of her normal day-to-day activities.  She has some sinus congestion and a runny nose. She's had no cough, phlegm production, increased shortness of breath,  orthopnea, chest pain, or palpitations. She's had some slight swelling in her feet and ankles bilaterally. Improves with elevation. She denies any abnormal headaches or dizziness. Her vision is a little blurry. She has general aches and pains, muscle aches, and joint pain likely associated with both the chemotherapy and her history of arthritis.   A detailed review of systems is otherwise stable and noncontributory.   PAST MEDICAL HISTORY: Past Medical History  Diagnosis Date  . Raynaud disease   . Osteoporosis   . Hypertension   . PONV (postoperative nausea and vomiting)   . Wears glasses   . Full dentures   . Fibromyalgia   . Rheumatoid arthritis     "elbows; fingers; toes"  . Osteoarthritis of both knees   . Degenerative disc disease, cervical   . Chronic left shoulder pain     "work related injury" (01/10/2014)  . Breast cancer     "right" (01/10/2014)  . Type II diabetes mellitus     has not taken meds in over a month (01/10/2014)  . Syncope and collapse     "4 times in the last year" (01/10/2014)    PAST SURGICAL HISTORY: Past Surgical History  Procedure Laterality Date  . Colonoscopy    . Mastectomy w/ sentinel node biopsy Right 12/13/2013    Procedure: RIGHT TOTAL MASTECTOMY WITH SENTINEL LYMPH NODE BIOPSY;  Surgeon: Adin Hector, MD;  Location: [place];  Service: General;  Laterality: Right;  . Portacath placement Right 12/13/2013    Procedure: INSERTION PORT-A-CATH;  Surgeon: Adin Hector, MD;  Location: [facility];  Service: General;  Laterality: Right;  . Breast reconstruction with placement of tissue expander and flex hd (acellular hydrated dermis) Right 12/13/2013    Procedure: RIGHT BREAST RECONSTRUCTION WITH PLACEMENT OF TISSUE EXPANDER AND FLEX HD (ACELLULAR HYDRATED DERMIS);  Surgeon: Irene Limbo, MD;  Location: [facility];  Service: Plastics;  Laterality: Right;  . Lipoma excision Bilateral 12/13/2013     Procedure: EXCISION OF LEFT BREAST KELOID AND RIGHT CHEST KELOID;  Surgeon: Irene Limbo, MD;  Location: [place];  Service: Plastics;  Laterality: Bilateral;  . Mastectomy    . Cholecystectomy  1970's  . Vaginal hysterectomy  1980    no salpingo-oophoretomu  . Breast biopsy Right 10/2013  . Tissue expander placement Right 01/10/2014    Procedure: Incision and Drainage Right Breast Seroma with TISSUE EXPANDER exchange;  Surgeon: Irene Limbo, MD;  Location: [facility];  Service: Plastics;  Laterality: Right;    FAMILY HISTORY History reviewed. No pertinent family history. The patient is little information about her father. Her mother died from a heart attack at the age of 27. She had been diagnosed with breast cancer in her late 55s. The patient had 4 brothers and 4 sisters. There is no other history of breast or ovarian cancer in the family to her knowledge.  GYNECOLOGIC HISTORY:    (Reviewed 03/14/2014) She does not recall her age of menarche. She underwent hysterectomy in her 64s. She did not receive hormone replacement. First live birth at 32. She was GX P2.  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 She is currently disabled secondary to her rheumatoid arthritis. Her husband Yassmine Tamm is a retired Administrator. He now works part time at [employer]. Son [name] works in [place] as a Patent attorney. Daughter [name] is a Social worker.    ADVANCED DIRECTIVES: Not in place   HEALTH MAINTENANCE: (Updated 03/14/2014) History  Substance Use Topics  . Smoking status: Never Smoker   . Smokeless tobacco: Never Used  . Alcohol Use: No     Colonoscopy: Not on file  PAP: Status post remote hysterectomy  Bone density: At [facility] hospital 01/05/2009 showed osteopenia with a T score of -1.7  Lipid panel: Not on file   Allergies  Allergen Reactions   . Codeine Other (See Comments) and Hives    Altered mental status, numbness  Altered mental status, Numbess  . Diazepam     Other reaction(s): Delusions (intolerance) hallucinations     Current Outpatient Prescriptions  Medication Sig Dispense Refill  . cyclobenzaprine (FLEXERIL) 10 MG tablet Take 10 mg by mouth 3 (three) times daily as needed for muscle spasms.      Marland Kitchen dexamethasone (DECADRON) 4 MG tablet 2 tabs by mouth with food twice daily on day before and 3 days after each chemo  30 tablet  1  . lidocaine-prilocaine (EMLA) cream Apply 1 application topically as needed. 1-2 hrs before port access  30 g  4  . Liraglutide (VICTOZA) 18 MG/3ML SOPN Inject 0.6 mg into the skin daily.       . meloxicam (MOBIC) 7.5 MG tablet Take 1 tablet (7.5 mg total) by mouth daily.  10 tablet  0  . oxyCODONE (ROXICODONE) 5 MG immediate release tablet Take 1 tablet (5 mg total) by mouth every 4 (four) hours as needed for severe pain.  40 tablet  0  . traMADol (ULTRAM) 50 MG tablet Take 50 mg by mouth 3 (three) times daily as needed (pain).       . Vitamin D, Ergocalciferol, (DRISDOL) 50000 UNITS CAPS capsule Take 50,000 Units by mouth every 7 (seven) days. Wednesdays      . Alum & Mag Hydroxide-Simeth (MAGIC MOUTHWASH W/LIDOCAINE) SOLN Take 5 mLs by mouth 4 (four) times daily as needed for mouth pain. Gargle, swish and spit  360 mL  1  . cholestyramine (QUESTRAN) 4 G packet Take 1 packet (4 g total) by mouth 2 (two) times daily. For diarrhea.  Mix with beverage or applesauce.  60 each  1  . fluconazole (DIFLUCAN) 100 MG tablet 2 tabs by mouth x 1 day, then 1 tab by mouth daily - total of 7 days  8 tablet  5  . LORazepam (ATIVAN) 0.5 MG tablet Take 0.5-1 tablets (0.25-0.5 mg total) by mouth at bedtime as needed for anxiety or sleep (or nausea).  30 tablet  0  . ondansetron (ZOFRAN) 8 MG tablet Take 8 mg by mouth See admin instructions. 1 tab by mouth twice daily x 3 days after each chemo, then 1 tab by mouth  every 12 hrs if needed for nausea      . prochlorperazine (COMPAZINE) 10 MG tablet Take 10 mg by mouth once. 1 tab by mouth with meals and at bedtime x 3 days after each chemo, then 1 tab by mouth every 6 hrs if needed for nausea       No current facility-administered medications for this visit.   Facility-Administered Medications Ordered in Other Visits  Medication Dose Route Frequency Provider Last  Rate Last Dose  . heparin lock flush 100 unit/mL  500 Units Intracatheter Once PRN Welby Montminy Milda Smart, PA-C      . pertuzumab (PERJETA) 420 mg in sodium chloride 0.9 % 250 mL chemo infusion  420 mg Intravenous Once Sharnika Binney G Bhavya Eschete, PA-C      . sodium chloride 0.9 % injection 10 mL  10 mL Intravenous PRN Wilmon Arms, MD   10 mL at 02/21/14 1759  . sodium chloride 0.9 % injection 10 mL  10 mL Intravenous PRN Eirik Schueler G Mirca Yale, PA-C   10 mL at 03/01/14 1425  . sodium chloride 0.9 % injection 10 mL  10 mL Intravenous PRN Chauncey Cruel, MD   10 mL at 03/01/14 0934  . sodium chloride 0.9 % injection 10 mL  10 mL Intracatheter PRN Kyasia Steuck G Yaslene Lindamood, PA-C      . trastuzumab (HERCEPTIN) 420 mg in sodium chloride 0.9 % 250 mL chemo infusion  6 mg/kg (Treatment Plan Actual) Intravenous Once Jarmon Javid Milda Smart, PA-C        OBJECTIVE:   Filed Vitals:   03/14/14 1120  BP: 137/87  Pulse: 109  Temp: 98.5 F (36.9 C)  Resp: 18     Body mass index is 28.36 kg/(m^2).    ECOG FS: 1 [name] who appears tired but is in no acute distress Filed Weights   03/14/14 1120  Weight: 145 lb 3.2 oz (65.862 kg)   Physical Exam: HEENT:  Sclerae anicteric.  Oropharynx is pink, moist, and clear currently no ulcerations, and no evidence of oropharyngeal candidiasis. No pharyngeal erythema.  Neck supple, trachea midline.  NODES:  No cervical or supraclavicular lymphadenopathy palpated.  BREAST EXAM: Breast exam was deferred. Axillae are benign bilaterally, with no palpable lymphadenopathy. LUNGS:  Clear to auscultation  bilaterally with good excursion.  No crackles, wheezes or rhonchi HEART:  Regular rate and rhythm. No murmur. ABDOMEN:  Soft, nontender. No hepatomegaly or masses palpated. Positive bowel sounds.  MSK:  No focal spinal tenderness to palpation. Good range of motion bilaterally in the upper extremities. EXTREMITIES:  No peripheral edema.  No lymphedema in the right upper extremity. SKIN:  Benign with no visible rashes. No nail dyscrasia. No significant hyperpigmentation. No excessive ecchymoses. No petechiae. No pallor. Skin turgor is fair. NEURO:  Nonfocal. Well oriented.  Fatigued affect.      LAB RESULTS:   Lab Results  Component Value Date   WBC 26.6* 03/14/2014   NEUTROABS 24.0* 03/14/2014   HGB 10.7* 03/14/2014   HCT 33.3* 03/14/2014   MCV 82.7 03/14/2014   PLT 200 03/14/2014      Chemistry      Component Value Date/Time   NA 138 03/01/2014 0924   NA 138 01/09/2014 1522   K 3.4* 03/01/2014 0924   K 4.2 01/09/2014 1522   CL 100 01/09/2014 1522   CO2 24 03/01/2014 0924   CO2 25 01/09/2014 1522   BUN 11.2 03/01/2014 0924   BUN 11 01/09/2014 1522   CREATININE 0.9 03/01/2014 0924   CREATININE 0.99 01/09/2014 1522      Component Value Date/Time   CALCIUM 8.9 03/01/2014 0924   CALCIUM 8.5 01/09/2014 1522   ALKPHOS 106 03/01/2014 0924   ALKPHOS 102 12/09/2013 1400   AST 15 03/01/2014 0924   AST 36 12/09/2013 1400   ALT 13 03/01/2014 0924   ALT 30 12/09/2013 1400   BILITOT 0.33 03/01/2014 0924   BILITOT 0.2* 12/09/2013 1400  STUDIES:  Echocardiogram on 12/20/2013 showed an ejection fraction of 55-60%.  This is due to be repeated in early August, as first dose of trastuzumab and pertuzumab was not given until 02/21/2014.  DG CHEST PORT 1 VIEW 03/03/215   CLINICAL DATA: Port-A-Cath placement.  EXAM:  PORTABLE CHEST - 1 VIEW  COMPARISON: 12/09/2013  FINDINGS:  There has been placement of a right subclavian Port-A-Cath which has  tip at the cavoatrial junction. No definite right  pneumothorax.  There is a small caliber catheter projected over the right chest  with tip just right of midline at the level of the hilum which may  be related to patient's recent right breast surgery. Lungs are  somewhat hypoinflated with minimal prominence of the perihilar  markings suggesting minimal vascular congestion. There is subtle  opacification over the lateral left base likely atelectasis.  Cardiomediastinal silhouette is within normal. There is a small  right cervical rib. There are surgical clips over the right axilla.  Remainder of the exam is unchanged.  IMPRESSION:  Minimal prominence of the perihilar markings suggesting minimal  vascular congestion. Possible minimal left basilar atelectasis.  Right subclavian Port-A-Cath with tip at the region of the  cavoatrial junction. No definite pneumothorax.  Small caliber catheter over the right chest with tip just right of  midline as this may represent a surgical drain related to patient's  recent breast surgery.  Electronically Signed  By: Marin Olp M.D.  On: 12/13/2013 13:27    ASSESSMENT: 63 y.o. Lupus woman   (1)  s/p biopsy of separate Right breast masses 10/11/2013 for a clinical mT1 N0, stage IA invasive ductal carcinoma, grade II-III, one of the mass is being 85% estrogen receptor positive, progesterone receptor negative, with an MIB-1 of 17% and HER-2 amplified.  (2) status post right mastectomy and sentinel lymph node sampling with immediate expander placement 12/13/2013 for a pT1c pN0 invasive ductal carcinoma, grade 3, again HER-2 positive.  (3) Being treated in the adjuvant setting with carboplatin, docetaxel, trastuzumab and pertuzumab, first dose on 02/21/2014. Neulasta on day 2 for granulocyte support. The plan is to repeat these agents every 21 days x6, after which the anti-HER-2 treatment (both trastuzumab and pertuzumab) will be continued to the total one year.  (4) The patient will not need  postmastectomy radiation, so antiestrogen therapy will be started as soon as chemotherapy is completed  (5) Rheumatoid arthritis, usually on Remicade, prednisone and methotrexate, held prior to mastectomy.  (6)  Cellulitis affecting the right reconstructed breast diagnosed at 01/09/2014 with subsequent surgery to drain the seroma and exchange tissue expander - Resolved  (7)  chemotherapy-induced peripheral neuropathy  PLAN: I have reviewed this case with Dr. Jana Hakim today. I am very concerned about the fact that Neelah has developed some significant neuropathy in both her hands and feet after only one cycle of docetaxel 3 weeks ago. It has not improved over the past 3 weeks, and if anything has actually worsened since her visit here on day 9 cycle 1. With her history of rheumatoid arthritis already affecting her hands and feet, this could be a significant quality of life issue for Marvene very quickly.   Per Dr. Virgie Dad suggestion, Adalea will proceed to treatment today as scheduled, but will receive only the trastuzumab and pertuzumab. Both docetaxel and carboplatin will be held today. We will cancel her Neulasta injection tomorrow. She will not need the oral dexamethasone over the next 3 days, and in fact will likely not need the  prochlorperazine either. She does understand that it is the pertuzumab that is the likely culprit for her diarrhea. I again encouraged her to eat a somewhat bland diet and avoid dairy products over the next several days. She also knows to utilize Imodium as needed for diarrhea, and to contact us if she is not able to control it on her own.  I will see Carlis again next week for assessment of chemotoxicity on 03/21/2014. I will see her 3 weeks from now on June 23. If the neuropathy has not completely resolved, Dr. Jana Hakim feels that we should stop the docetaxel and carboplatin altogether, start her on anastrozole at 1 mg daily, and continue only the trastuzumab and  pertuzumab every 3 weeks for total of 6 cycles. After 6 cycles, the pertuzumab would be stopped, and she would receive trastuzumab alone every 3 weeks to complete a total of one year.  I have reviewed this plan in detail with the patient and with her daughter today, and in fact I have given them all of this information in writing. She is very much in agreement with this plan, and we'll continue to follow her very closely over the next several weeks while we make these decisions.  In the meanwhile, she knows to call with any changes or problems.    Theotis Burrow, PA-C   03/14/2014 1:07 PM

## 2014-03-14 NOTE — Patient Instructions (Signed)
Pertuzumab injection What is this medicine? PERTUZUMAB (per TOOZ ue mab) is a monoclonal antibody that targets a protein called HER2. HER2 is found in some breast cancers. This medicine can stop cancer cell growth. This medicine is used with other cancer treatments. This medicine may be used for other purposes; ask your health care provider or pharmacist if you have questions. COMMON BRAND NAME(S): PERJETA What should I tell my health care provider before I take this medicine? They need to know if you have any of these conditions: -heart disease -heart failure -high blood pressure -history of irregular heart beat -recent or ongoing radiation therapy -an unusual or allergic reaction to pertuzumab, other medicines, foods, dyes, or preservatives -pregnant or trying to get pregnant -breast-feeding How should I use this medicine? This medicine is for infusion into a vein. It is given by a health care professional in a hospital or clinic setting. Talk to your pediatrician regarding the use of this medicine in children. Special care may be needed. Overdosage: If you think you've taken too much of this medicine contact a poison control center or emergency room at once. Overdosage: If you think you have taken too much of this medicine contact a poison control center or emergency room at once. NOTE: This medicine is only for you. Do not share this medicine with others. What if I miss a dose? It is important not to miss your dose. Call your doctor or health care professional if you are unable to keep an appointment. What may interact with this medicine? Interactions are not expected. Give your health care provider a list of all the medicines, herbs, non-prescription drugs, or dietary supplements you use. Also tell them if you smoke, drink alcohol, or use illegal drugs. Some items may interact with your medicine. This list may not describe all possible interactions. Give your health care provider a  list of all the medicines, herbs, non-prescription drugs, or dietary supplements you use. Also tell them if you smoke, drink alcohol, or use illegal drugs. Some items may interact with your medicine. What should I watch for while using this medicine? Your condition will be monitored carefully while you are receiving this medicine. Report any side effects. Continue your course of treatment even though you feel ill unless your doctor tells you to stop. Do not become pregnant while taking this medicine. Women should inform their doctor if they wish to become pregnant or think they might be pregnant. There is a potential for serious side effects to an unborn child. Talk to your health care professional or pharmacist for more information. Do not breast-feed an infant while taking this medicine. Call your doctor or health care professional for advice if you get a fever, chills or sore throat, or other symptoms of a cold or flu. Do not treat yourself. Try to avoid being around people who are sick. You may experience fever, chills, and headache during the infusion. Report any side effects during the infusion to your health care professional. What side effects may I notice from receiving this medicine? Side effects that you should report to your doctor or health care professional as soon as possible: -breathing problems -chest pain or palpitations -dizziness -feeling faint or lightheaded -fever or chills -skin rash, itching or hives -sore throat -swelling of the face, lips, or tongue -swelling of the legs or ankles -unusually weak or tired  Side effects that usually do not require medical attention (Report these to your doctor or health care professional if they continue  or are bothersome.): -diarrhea -hair loss -nausea, vomiting -tiredness This list may not describe all possible side effects. Call your doctor for medical advice about side effects. You may report side effects to FDA at  1-800-FDA-1088. Where should I keep my medicine? This drug is given in a hospital or clinic and will not be stored at home. NOTE: This sheet is a summary. It may not cover all possible information. If you have questions about this medicine, talk to your doctor, pharmacist, or health care provider.  2014, [employer] Standard. (2012-07-28 16:54:15) Trastuzumab injection for infusion What is this medicine? TRASTUZUMAB (tras TOO zoo mab) is a monoclonal antibody. It targets a protein called HER2. This protein is found in some stomach and breast cancers. This medicine can stop cancer cell growth. This medicine may be used with other cancer treatments. This medicine may be used for other purposes; ask your health care provider or pharmacist if you have questions. COMMON BRAND NAME(S): Herceptin What should I tell my health care provider before I take this medicine? They need to know if you have any of these conditions: -heart disease -heart failure -infection (especially a virus infection such as chickenpox, cold sores, or herpes) -lung or breathing disease, like asthma -recent or ongoing radiation therapy -an unusual or allergic reaction to trastuzumab, benzyl alcohol, or other medications, foods, dyes, or preservatives -pregnant or trying to get pregnant -breast-feeding How should I use this medicine? This drug is given as an infusion into a vein. It is administered in a hospital or clinic by a specially trained health care professional. Talk to your pediatrician regarding the use of this medicine in children. This medicine is not approved for use in children. Overdosage: If you think you have taken too much of this medicine contact a poison control center or emergency room at once. NOTE: This medicine is only for you. Do not share this medicine with others. What if I miss a dose? It is important not to miss a dose. Call your doctor or health care professional if you are unable to keep an  appointment. What may interact with this medicine? -cyclophosphamide -doxorubicin -warfarin This list may not describe all possible interactions. Give your health care provider a list of all the medicines, herbs, non-prescription drugs, or dietary supplements you use. Also tell them if you smoke, drink alcohol, or use illegal drugs. Some items may interact with your medicine. What should I watch for while using this medicine? Visit your doctor for checks on your progress. Report any side effects. Continue your course of treatment even though you feel ill unless your doctor tells you to stop. Call your doctor or health care professional for advice if you get a fever, chills or sore throat, or other symptoms of a cold or flu. Do not treat yourself. Try to avoid being around people who are sick. You may experience fever, chills and shaking during your first infusion. These effects are usually mild and can be treated with other medicines. Report any side effects during the infusion to your health care professional. Fever and chills usually do not happen with later infusions. What side effects may I notice from receiving this medicine? Side effects that you should report to your doctor or other health care professional as soon as possible: -breathing difficulties -chest pain or palpitations -cough -dizziness or fainting -fever or chills, sore throat -skin rash, itching or hives -swelling of the legs or ankles -unusually weak or tired Side effects that usually do not require  medical attention (report to your doctor or other health care professional if they continue or are bothersome): -loss of appetite -headache -muscle aches -nausea This list may not describe all possible side effects. Call your doctor for medical advice about side effects. You may report side effects to FDA at 1-800-FDA-1088. Where should I keep my medicine? This drug is given in a hospital or clinic and will not be stored at  home. NOTE: This sheet is a summary. It may not cover all possible information. If you have questions about this medicine, talk to your doctor, pharmacist, or health care provider.  2014, [employer] Standard. (2009-08-03 13:43:15)

## 2014-03-15 ENCOUNTER — Ambulatory Visit: Payer: Medicare Other

## 2014-03-16 ENCOUNTER — Other Ambulatory Visit: Payer: Self-pay | Admitting: *Deleted

## 2014-03-16 ENCOUNTER — Ambulatory Visit: Payer: Medicare Other | Admitting: Nutrition

## 2014-03-16 NOTE — Progress Notes (Signed)
This is a 63 year old female diagnosed with breast cancer, patient of Dr. Jana Hakim.   Past medical history includes diabetes.   Medications include Decadron, compazine, zofran, questran, Victoza, vitamin D  Labs: Reviewed  Height: 60 inches Weight: 145.6 pounds Usual Body Weight: 160 pounds just before treatment (patient had gained weight prior to treatment, usual weight 140 pounds)  BMI: 28.4  Patient and daughter present to nutrition assessment. Patient reports very poor tolerance of 1st chemotherapy cycle. She reports having no appetite during that time. She lost about 22 pounds (lowest weight 138 pounds). However, her appetite is currently good, and she has since regained 7 pounds. She just started her second chemotherapy cycle this week, and would like to maintain her weight. She has been experiencing some diarrhea.   Nutrition diagnosis: Predicted suboptimal energy intake related to new cycle of chemotherapy as evidenced by history of weight loss during treatment.   Intervention: Patient educated on a plant-based diet for healthy eating while appetite is good. We discussed the importance of increasing fruits and vegetables in the diet for a balanced intake. We also discussed the importance of small, frequent meals and snacks throughout treatment to maintain weight. Patient was also educated on strategies to increase calories and protein if appetite decreases. Teach back method used.  Monitoring, evaluation, goals: Patient will tolerate a healthy, plant-based diet with adequate calories and protein to maintain current weight.   Next visit: Follow up not needed at this time. Patient has contact information and will contact with any questions.

## 2014-03-21 ENCOUNTER — Ambulatory Visit: Payer: Medicare Other

## 2014-03-21 ENCOUNTER — Ambulatory Visit (HOSPITAL_BASED_OUTPATIENT_CLINIC_OR_DEPARTMENT_OTHER): Payer: Medicare Other | Admitting: Physician Assistant

## 2014-03-21 ENCOUNTER — Other Ambulatory Visit (HOSPITAL_BASED_OUTPATIENT_CLINIC_OR_DEPARTMENT_OTHER): Payer: Medicare Other

## 2014-03-21 ENCOUNTER — Encounter: Payer: Self-pay | Admitting: Physician Assistant

## 2014-03-21 VITALS — BP 140/90 | HR 114 | Temp 98.1°F | Resp 18 | Ht 60.0 in | Wt 145.7 lb

## 2014-03-21 DIAGNOSIS — C50919 Malignant neoplasm of unspecified site of unspecified female breast: Secondary | ICD-10-CM

## 2014-03-21 DIAGNOSIS — C50311 Malignant neoplasm of lower-inner quadrant of right female breast: Secondary | ICD-10-CM

## 2014-03-21 DIAGNOSIS — R5381 Other malaise: Secondary | ICD-10-CM

## 2014-03-21 DIAGNOSIS — R61 Generalized hyperhidrosis: Secondary | ICD-10-CM

## 2014-03-21 DIAGNOSIS — T451X5A Adverse effect of antineoplastic and immunosuppressive drugs, initial encounter: Secondary | ICD-10-CM

## 2014-03-21 DIAGNOSIS — Z95828 Presence of other vascular implants and grafts: Secondary | ICD-10-CM

## 2014-03-21 DIAGNOSIS — M792 Neuralgia and neuritis, unspecified: Secondary | ICD-10-CM

## 2014-03-21 DIAGNOSIS — G620 Drug-induced polyneuropathy: Secondary | ICD-10-CM

## 2014-03-21 DIAGNOSIS — M069 Rheumatoid arthritis, unspecified: Secondary | ICD-10-CM

## 2014-03-21 DIAGNOSIS — R5383 Other fatigue: Secondary | ICD-10-CM

## 2014-03-21 DIAGNOSIS — M2550 Pain in unspecified joint: Secondary | ICD-10-CM

## 2014-03-21 DIAGNOSIS — Z170 Estrogen receptor positive status [ER+]: Secondary | ICD-10-CM

## 2014-03-21 DIAGNOSIS — D649 Anemia, unspecified: Secondary | ICD-10-CM

## 2014-03-21 DIAGNOSIS — G609 Hereditary and idiopathic neuropathy, unspecified: Secondary | ICD-10-CM

## 2014-03-21 LAB — CBC WITH DIFFERENTIAL/PLATELET
BASO%: 0.1 % (ref 0.0–2.0)
BASOS ABS: 0 10*3/uL (ref 0.0–0.1)
EOS%: 0.6 % (ref 0.0–7.0)
Eosinophils Absolute: 0.1 10*3/uL (ref 0.0–0.5)
HEMATOCRIT: 36.1 % (ref 34.8–46.6)
HGB: 11.5 g/dL — ABNORMAL LOW (ref 11.6–15.9)
LYMPH%: 24 % (ref 14.0–49.7)
MCH: 26.7 pg (ref 25.1–34.0)
MCHC: 31.9 g/dL (ref 31.5–36.0)
MCV: 83.8 fL (ref 79.5–101.0)
MONO#: 0.6 10*3/uL (ref 0.1–0.9)
MONO%: 6.6 % (ref 0.0–14.0)
NEUT#: 5.9 10*3/uL (ref 1.5–6.5)
NEUT%: 68.7 % (ref 38.4–76.8)
PLATELETS: 204 10*3/uL (ref 145–400)
RBC: 4.31 10*6/uL (ref 3.70–5.45)
RDW: 15.6 % — ABNORMAL HIGH (ref 11.2–14.5)
WBC: 8.6 10*3/uL (ref 3.9–10.3)
lymph#: 2.1 10*3/uL (ref 0.9–3.3)

## 2014-03-21 LAB — COMPREHENSIVE METABOLIC PANEL (CC13)
ALBUMIN: 3.3 g/dL — AB (ref 3.5–5.0)
ALT: 14 U/L (ref 0–55)
AST: 13 U/L (ref 5–34)
Alkaline Phosphatase: 93 U/L (ref 40–150)
Anion Gap: 9 mEq/L (ref 3–11)
BUN: 11.4 mg/dL (ref 7.0–26.0)
CO2: 25 meq/L (ref 22–29)
Calcium: 9.1 mg/dL (ref 8.4–10.4)
Chloride: 109 mEq/L (ref 98–109)
Creatinine: 1 mg/dL (ref 0.6–1.1)
GLUCOSE: 173 mg/dL — AB (ref 70–140)
POTASSIUM: 3.4 meq/L — AB (ref 3.5–5.1)
SODIUM: 144 meq/L (ref 136–145)
TOTAL PROTEIN: 7 g/dL (ref 6.4–8.3)
Total Bilirubin: 0.26 mg/dL (ref 0.20–1.20)

## 2014-03-21 MED ORDER — SODIUM CHLORIDE 0.9 % IJ SOLN
10.0000 mL | INTRAMUSCULAR | Status: DC | PRN
  Administered 2014-03-21: 10 mL via INTRAVENOUS
  Filled 2014-03-21: qty 10

## 2014-03-21 MED ORDER — OXYCODONE HCL 5 MG PO TABS: 5.0000 mg | ORAL_TABLET | ORAL | Status: DC | PRN

## 2014-03-21 MED ORDER — HEPARIN SOD (PORK) LOCK FLUSH 100 UNIT/ML IV SOLN
500.0000 [IU] | Freq: Once | INTRAVENOUS | Status: AC
  Administered 2014-03-21: 500 [IU] via INTRAVENOUS
  Filled 2014-03-21: qty 5

## 2014-03-21 NOTE — Progress Notes (Signed)
[facility]  [phone] [phone]     ID: Huel Cote OB: January 10, 1951  MR#: [id number]  PNT#:[id number]  PCP: Alva Garnet., MD GYN:   SUClaud Kelp OTHER MD: Zenovia Jordan, Alan Ripper Sanger, Irean Hong Thimmappa  CHIEF COMPLAINT:  Right Breast Cancer/Adjuvant Chemo   HISTORY OF PRESENT ILLNESS: Angela Gross had routine screening mammography at the breast center 09/21/2013 showing a possible mass in calcifications in the right breast. On 10/11/2013 she underwent right diagnostic mammography and ultrasonography. This confirmed a spiculated 1.2 cm mass in the central right breast, with a group of pleomorphic calcifications slightly laterally. The calcifications spanning 1.6 cm. A second group of calcifications extended inferiorly and spanned 8 mm. The entire area in aggregate measured 6.5 cm. By physical exam there was no palpable abnormality. Ultrasonography showed an irregular hypoechoic mass at the 6:00 position of the right breast measuring 1 cm maximally. The right axilla was normal.  Biopsy of the right breast mass in question as well as the more lateral area of calcifications on 10/11/2013 showed ([id number]) most to be positive for an invasive ductal carcinoma, both estrogen receptor 85-90% positive with strong staining intensity, both progesterone receptor negative, with an MIB-1 of 17%. HER-2 was amplified, with a signals ratio of 3.44 and a copy number per cell 04 0.30.  Bilateral breast MRI 10/19/2013 showed again a spiculated mass in the 6:00 region of the right breast measuring 1.2 cm, a slightly more lateral hematoma associated with a second biopsy and also with clumped enhancement, and asymmetric none masslike enhancement in most of the right breast, the entire area of abnormality measuring 9.0 cm. The left breast was unremarkable and there were no abnormal appearing lymph nodes.  Her subsequent history is as detailed below    INTERVAL  HISTORY: Angela Gross returns today accompanied by her daughter for followup of her right breast cancer. She is currently receiving adjuvant chemotherapy.  She received one cycle of  docetaxel/carboplatin given with trastuzumab/pertuzumab 4 weeks ago. When seen here last week in anticipation of cycle 2, she had already developed significant peripheral neuropathy. The decision was made last week on 03/14/2014 to proceed with trastuzumab/pertuzumab alone, holding both the docetaxel and carboplatin.  She returns today for reevaluation and assessment of tolerance.  Unfortunately, although the neuropathy has improved slightly, it is still a persistent problem in both the upper and lower extremities. She continues to have problems dropping things. She's having more problems with fine motor skills like buttoning a blouse. Her feet hurt, and she feels unsteady on her feet, especially if she walks for prolonged periods of time. Of course she also has a history of rheumatoid arthritis affecting hands and feet, and that has exacerbated the issue. She's currently using an occasional dose of oxycodone 5 mg for the pain, especially at night when the neuropathic pain is keeping her awake.  With regards to her tolerance of trastuzumab/pertuzumab which she received one week ago, she tolerated these agents very well with no notable side effects. Her appetite has been great, and she is Herself well hydrated. She's had no change in bowel habits whatsoever, and denies any problems with diarrhea. She denies any cough, phlegm production, increased shortness of breath, orthopnea, peripheral swelling, chest pain, or palpitations.   REVIEW OF SYSTEMS: Angela Gross denies any fevers, chills, or night sweats, but continues to have hot flashes. She's had no rashes,  skin changes, or nail bed changes, and denies any abnormal bruising or bleeding. She continues to  have moderate fatigue. As noted above, she is keeping herself well hydrated, and  she's had no nausea or emesis. She's noted no change in urinary habits. She continues to have periodic headaches which she tells me are "not new" and are stable. She feels somewhat weak, but denies any dizziness and has had no change in vision other than mild blurred vision, also stable.  A detailed review of systems is otherwise stable and noncontributory.    PAST MEDICAL HISTORY: Past Medical History  Diagnosis Date  . Raynaud disease   . Osteoporosis   . Hypertension   . PONV (postoperative nausea and vomiting)   . Wears glasses   . Full dentures   . Fibromyalgia   . Rheumatoid arthritis     "elbows; fingers; toes"  . Osteoarthritis of both knees   . Degenerative disc disease, cervical   . Chronic left shoulder pain     "work related injury" (01/10/2014)  . Breast cancer     "right" (01/10/2014)  . Type II diabetes mellitus     has not taken meds in over a month (01/10/2014)  . Syncope and collapse     "4 times in the last year" (01/10/2014)    PAST SURGICAL HISTORY: Past Surgical History  Procedure Laterality Date  . Colonoscopy    . Mastectomy w/ sentinel node biopsy Right 12/13/2013    Procedure: RIGHT TOTAL MASTECTOMY WITH SENTINEL LYMPH NODE BIOPSY;  Surgeon: Adin Hector, MD;  Location: [place];  Service: General;  Laterality: Right;  . Portacath placement Right 12/13/2013    Procedure: INSERTION PORT-A-CATH;  Surgeon: Adin Hector, MD;  Location: [place];  Service: General;  Laterality: Right;  . Breast reconstruction with placement of tissue expander and flex hd (acellular hydrated dermis) Right 12/13/2013    Procedure: RIGHT BREAST RECONSTRUCTION WITH PLACEMENT OF TISSUE EXPANDER AND FLEX HD (ACELLULAR HYDRATED DERMIS);  Surgeon: Irene Limbo, MD;  Location: [place];  Service: Plastics;  Laterality: Right;  . Lipoma excision Bilateral 12/13/2013    Procedure: EXCISION OF LEFT BREAST KELOID AND RIGHT CHEST  KELOID;  Surgeon: Irene Limbo, MD;  Location: [place];  Service: Plastics;  Laterality: Bilateral;  . Mastectomy    . Cholecystectomy  1970's  . Vaginal hysterectomy  1980    no salpingo-oophoretomu  . Breast biopsy Right 10/2013  . Tissue expander placement Right 01/10/2014    Procedure: Incision and Drainage Right Breast Seroma with TISSUE EXPANDER exchange;  Surgeon: Irene Limbo, MD;  Location: [place];  Service: Plastics;  Laterality: Right;    FAMILY HISTORY History reviewed. No pertinent family history. The patient is little information about her father. Her mother died from a heart attack at the age of 64. She had been diagnosed with breast cancer in her late 50s. The patient had 4 brothers and 4 sisters. There is no other history of breast or ovarian cancer in the family to her knowledge.  GYNECOLOGIC HISTORY:    (Reviewed 03/21/2014) She does not recall her age of menarche. She underwent hysterectomy in her 70s. She did not receive hormone replacement. First live birth at 68. She was GX P2.  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 She is currently disabled secondary to her rheumatoid arthritis. Her husband Sila Sarsfield is a retired Administrator. He now works part time at [employer]. Son Roderic Scarce "Venora Maples"  Ziglar works in [employer] as a Patent attorney. Daughter [name] is a Social worker.    ADVANCED DIRECTIVES: Not in place   HEALTH MAINTENANCE: (Updated 03/21/2014) History  Substance Use Topics  . Smoking status: Never Smoker   . Smokeless tobacco: Never Used  . Alcohol Use: No     Colonoscopy: Not on file  PAP: Status post remote hysterectomy  Bone density: At [facility] hospital 01/05/2009 showed osteopenia with a T score of -1.7  Lipid panel: Not on file   Allergies  Allergen Reactions  . Codeine Other (See Comments) and Hives    Altered  mental status, numbness  Altered mental status, Numbess  . Diazepam     Other reaction(s): Delusions (intolerance) hallucinations     Current Outpatient Prescriptions  Medication Sig Dispense Refill  . cyclobenzaprine (FLEXERIL) 10 MG tablet Take 10 mg by mouth 3 (three) times daily as needed for muscle spasms.      . Liraglutide (VICTOZA) 18 MG/3ML SOPN Inject 0.6 mg into the skin daily.       . traMADol (ULTRAM) 50 MG tablet Take 50 mg by mouth 3 (three) times daily as needed (pain).       . Vitamin D, Ergocalciferol, (DRISDOL) 50000 UNITS CAPS capsule Take 50,000 Units by mouth every 7 (seven) days. Wednesdays      . Alum & Mag Hydroxide-Simeth (MAGIC MOUTHWASH W/LIDOCAINE) SOLN Take 5 mLs by mouth 4 (four) times daily as needed for mouth pain. Gargle, swish and spit  360 mL  1  . cholestyramine (QUESTRAN) 4 G packet Take 1 packet (4 g total) by mouth 2 (two) times daily. For diarrhea.  Mix with beverage or applesauce.  60 each  1  . dexamethasone (DECADRON) 4 MG tablet 2 tabs by mouth with food twice daily on day before and 3 days after each chemo  30 tablet  1  . lidocaine-prilocaine (EMLA) cream Apply 1 application topically as needed. 1-2 hrs before port access  30 g  4  . LORazepam (ATIVAN) 0.5 MG tablet Take 0.5-1 tablets (0.25-0.5 mg total) by mouth at bedtime as needed for anxiety or sleep (or nausea).  30 tablet  0  . meloxicam (MOBIC) 7.5 MG tablet Take 1 tablet (7.5 mg total) by mouth daily.  10 tablet  0  . ondansetron (ZOFRAN) 8 MG tablet Take 8 mg by mouth See admin instructions. 1 tab by mouth twice daily x 3 days after each chemo, then 1 tab by mouth every 12 hrs if needed for nausea      . oxyCODONE (ROXICODONE) 5 MG immediate release tablet Take 1 tablet (5 mg total) by mouth every 4 (four) hours as needed for moderate pain or severe pain.  20 tablet  0  . prochlorperazine (COMPAZINE) 10 MG tablet Take 10 mg by mouth once. 1 tab by mouth with meals and at bedtime x 3 days  after each chemo, then 1 tab by mouth every 6 hrs if needed for nausea       No current facility-administered medications for this visit.   Facility-Administered Medications Ordered in Other Visits  Medication Dose Route Frequency Provider Last Rate Last Dose  . sodium chloride 0.9 % injection 10 mL  10 mL Intravenous PRN Wilmon Arms, MD   10 mL at 02/21/14 1759  . sodium chloride 0.9 % injection 10 mL  10 mL Intravenous PRN Shakena Callari G Haidyn Kilburg, PA-C   10 mL at 03/01/14 1425  . sodium chloride 0.9 %  injection 10 mL  10 mL Intravenous PRN Chauncey Cruel, MD   10 mL at 03/01/14 0934    OBJECTIVE:   Filed Vitals:   03/21/14 1435  BP: 140/90  Pulse: 114  Temp: 98.1 F (36.7 C)  Resp: 18     Body mass index is 28.46 kg/(m^2).    ECOG FS: 1 Middle-aged African American woman in no acute distress Filed Weights   03/21/14 1435  Weight: 145 lb 11.2 oz (66.089 kg)   Physical Exam: HEENT:  Sclerae anicteric.  Oropharynx is pink, moist, and clear.  No evidence of mucositis or candidiasis. Neck supple, trachea midline. NODES:  No cervical or supraclavicular lymphadenopathy palpated.  BREAST EXAM: Breast exam was deferred. Axillae are benign bilaterally, with no palpable lymphadenopathy. LUNGS:  Clear to auscultation bilaterally with good excursion.  No wheezes or rhonchi HEART:  Regular rate and rhythm. No murmur appreciated ABDOMEN:  Soft, nontender. No hepatomegaly or masses palpated. Positive bowel sounds.  MSK:  No focal spinal tenderness to palpation. Good range of motion bilaterally in the upper extremities. EXTREMITIES:  No peripheral edema.  No lymphedema in the right upper extremity. SKIN:  Benign with no visible rashes and only mild hyperpigmentation on the hands and feet. No nail dyscrasia.  No excessive ecchymoses. No petechiae. No pallor. Skin is warm and dry. NEURO:  Nonfocal. Well oriented.  Appropriate affect.    LAB RESULTS:   Lab Results  Component Value Date   WBC 8.6  03/21/2014   NEUTROABS 5.9 03/21/2014   HGB 11.5* 03/21/2014   HCT 36.1 03/21/2014   MCV 83.8 03/21/2014   PLT 204 03/21/2014      Chemistry      Component Value Date/Time   NA 144 03/21/2014 1411   NA 138 01/09/2014 1522   K 3.4* 03/21/2014 1411   K 4.2 01/09/2014 1522   CL 100 01/09/2014 1522   CO2 25 03/21/2014 1411   CO2 25 01/09/2014 1522   BUN 11.4 03/21/2014 1411   BUN 11 01/09/2014 1522   CREATININE 1.0 03/21/2014 1411   CREATININE 0.99 01/09/2014 1522      Component Value Date/Time   CALCIUM 9.1 03/21/2014 1411   CALCIUM 8.5 01/09/2014 1522   ALKPHOS 93 03/21/2014 1411   ALKPHOS 102 12/09/2013 1400   AST 13 03/21/2014 1411   AST 36 12/09/2013 1400   ALT 14 03/21/2014 1411   ALT 30 12/09/2013 1400   BILITOT 0.26 03/21/2014 1411   BILITOT 0.2* 12/09/2013 1400       STUDIES:  Echocardiogram on 12/20/2013 showed an ejection fraction of 55-60%.  This is due to be repeated in early August, as first dose of trastuzumab and pertuzumab was not given until 02/21/2014.  DG CHEST PORT 1 VIEW 03/03/215   CLINICAL DATA: Port-A-Cath placement.  EXAM:  PORTABLE CHEST - 1 VIEW  COMPARISON: 12/09/2013  FINDINGS:  There has been placement of a right subclavian Port-A-Cath which has  tip at the cavoatrial junction. No definite right pneumothorax.  There is a small caliber catheter projected over the right chest  with tip just right of midline at the level of the hilum which may  be related to patient's recent right breast surgery. Lungs are  somewhat hypoinflated with minimal prominence of the perihilar  markings suggesting minimal vascular congestion. There is subtle  opacification over the lateral left base likely atelectasis.  Cardiomediastinal silhouette is within normal. There is a small  right cervical rib. There are surgical  clips over the right axilla.  Remainder of the exam is unchanged.  IMPRESSION:  Minimal prominence of the perihilar markings suggesting minimal  vascular congestion. Possible  minimal left basilar atelectasis.  Right subclavian Port-A-Cath with tip at the region of the  cavoatrial junction. No definite pneumothorax.  Small caliber catheter over the right chest with tip just right of  midline as this may represent a surgical drain related to patient's  recent breast surgery.  Electronically Signed  By: Marin Olp M.D.  On: 12/13/2013 13:27    ASSESSMENT: 63 y.o. [place] woman   (1)  s/p biopsy of separate Right breast masses 10/11/2013 for a clinical mT1 N0, stage IA invasive ductal carcinoma, grade II-III, one of the mass is being 85% estrogen receptor positive, progesterone receptor negative, with an MIB-1 of 17% and HER-2 amplified.  (2) status post right mastectomy and sentinel lymph node sampling with immediate expander placement 12/13/2013 for a pT1c pN0 invasive ductal carcinoma, grade 3, again HER-2 positive.  (3) Being treated in the adjuvant setting with carboplatin, docetaxel, trastuzumab and pertuzumab, first dose on 02/21/2014. Neulasta on day 2 for granulocyte support. The plan is to repeat these agents every 21 days x6, after which the anti-HER-2 treatment (both trastuzumab and pertuzumab) will be continued to the total one year. Patient developed significant peripheral neuropathy after one cycle of chemotherapy, and both carboplatin and docetaxel were held with cycle 2.  (4) The patient will not need postmastectomy radiation, so antiestrogen therapy will be started as soon as chemotherapy is completed  (5) Rheumatoid arthritis, usually on Remicade, prednisone and methotrexate, held prior to mastectomy.  (6)  Cellulitis affecting the right reconstructed breast diagnosed at 01/09/2014 with subsequent surgery to drain the seroma and exchange tissue expander - Resolved   PLAN: Also Amia has seen some slight improvement in the neuropathy over the past week, it has been minimal, and this continues to be a significant issue. Fortunately,  however, she continues to tolerate both the trastuzumab and the pertuzumab well with no significant side effects.  As stated in our last office note, Dr. Jana Hakim has suggested reevaluating the patient when she returns in 2 weeks, due for her next infusion. If the peripheral neuropathy has not resolved completely, we will discontinue both carboplatin and docetaxel, continue only the trastuzumab and pertuzumab every 3 weeks, and go ahead and start her on anastrozole for antiestrogen therapy.  Of course we will continue to treat every 3 weeks for total of 6 cycles with both trastuzumab and pertuzumab.  After 6 cycles, the pertuzumab would be stopped, and she would receive trastuzumab alone every 3 weeks to complete a total of one year.  I have given Angela Gross a refill on her oxycodone 5 mg, one tablet up to every 4 hours as needed for pain, dispensed #20 with no refills. When I see her again in 2 weeks we will reassess, and if in fact we discontinue the docetaxel, I have suggested trying gabapentin for her neuropathic pain as well as her hot flashes. She tells me she is actually taken this in the past with very good results, and is very much in favor of this plan. At that point, we would discontinue the oxycodone and suggest that she take the gabapentin along with tramadol as needed..  I have reviewed this plan in detail with the patient and with her daughter today.  They are both very much in favor of this plan, and voiced their understanding and agreement. They'll call  with any changes or problems prior to her next scheduled appointment here which is June 23.   Theotis Burrow, PA-C   03/21/2014 3:09 PM

## 2014-03-21 NOTE — Patient Instructions (Signed)

## 2014-03-28 ENCOUNTER — Ambulatory Visit: Payer: Medicare Other | Admitting: Physician Assistant

## 2014-03-28 ENCOUNTER — Other Ambulatory Visit: Payer: Medicare Other

## 2014-03-30 ENCOUNTER — Other Ambulatory Visit: Payer: Self-pay | Admitting: Physician Assistant

## 2014-04-03 ENCOUNTER — Other Ambulatory Visit: Payer: Self-pay | Admitting: Physician Assistant

## 2014-04-04 ENCOUNTER — Other Ambulatory Visit (HOSPITAL_BASED_OUTPATIENT_CLINIC_OR_DEPARTMENT_OTHER): Payer: Medicare Other

## 2014-04-04 ENCOUNTER — Encounter: Payer: Self-pay | Admitting: Physician Assistant

## 2014-04-04 ENCOUNTER — Ambulatory Visit: Payer: Medicare Other | Admitting: Physician Assistant

## 2014-04-04 ENCOUNTER — Ambulatory Visit: Payer: Medicare Other

## 2014-04-04 ENCOUNTER — Telehealth: Payer: Self-pay | Admitting: Oncology

## 2014-04-04 ENCOUNTER — Telehealth (HOSPITAL_COMMUNITY): Payer: Self-pay | Admitting: Vascular Surgery

## 2014-04-04 ENCOUNTER — Other Ambulatory Visit: Payer: Self-pay | Admitting: *Deleted

## 2014-04-04 ENCOUNTER — Other Ambulatory Visit: Payer: Medicare Other

## 2014-04-04 ENCOUNTER — Ambulatory Visit (HOSPITAL_BASED_OUTPATIENT_CLINIC_OR_DEPARTMENT_OTHER): Payer: Medicare Other

## 2014-04-04 ENCOUNTER — Ambulatory Visit (HOSPITAL_BASED_OUTPATIENT_CLINIC_OR_DEPARTMENT_OTHER): Payer: Medicare Other | Admitting: Physician Assistant

## 2014-04-04 VITALS — BP 121/84 | HR 106 | Temp 98.2°F | Resp 18 | Ht 60.0 in | Wt 144.1 lb

## 2014-04-04 DIAGNOSIS — C50919 Malignant neoplasm of unspecified site of unspecified female breast: Secondary | ICD-10-CM

## 2014-04-04 DIAGNOSIS — Z5112 Encounter for antineoplastic immunotherapy: Secondary | ICD-10-CM

## 2014-04-04 DIAGNOSIS — N951 Menopausal and female climacteric states: Secondary | ICD-10-CM

## 2014-04-04 DIAGNOSIS — D649 Anemia, unspecified: Secondary | ICD-10-CM

## 2014-04-04 DIAGNOSIS — C50311 Malignant neoplasm of lower-inner quadrant of right female breast: Secondary | ICD-10-CM

## 2014-04-04 DIAGNOSIS — M810 Age-related osteoporosis without current pathological fracture: Secondary | ICD-10-CM

## 2014-04-04 DIAGNOSIS — E876 Hypokalemia: Secondary | ICD-10-CM

## 2014-04-04 DIAGNOSIS — Z95828 Presence of other vascular implants and grafts: Secondary | ICD-10-CM

## 2014-04-04 DIAGNOSIS — T451X5A Adverse effect of antineoplastic and immunosuppressive drugs, initial encounter: Secondary | ICD-10-CM

## 2014-04-04 DIAGNOSIS — M2550 Pain in unspecified joint: Secondary | ICD-10-CM

## 2014-04-04 DIAGNOSIS — G620 Drug-induced polyneuropathy: Secondary | ICD-10-CM

## 2014-04-04 HISTORY — DX: Menopausal and female climacteric states: N95.1

## 2014-04-04 LAB — CBC WITH DIFFERENTIAL/PLATELET
BASO%: 0.1 % (ref 0.0–2.0)
Basophils Absolute: 0 10*3/uL (ref 0.0–0.1)
EOS ABS: 0.1 10*3/uL (ref 0.0–0.5)
EOS%: 1 % (ref 0.0–7.0)
HCT: 34.1 % — ABNORMAL LOW (ref 34.8–46.6)
HGB: 11 g/dL — ABNORMAL LOW (ref 11.6–15.9)
LYMPH#: 2 10*3/uL (ref 0.9–3.3)
LYMPH%: 21.9 % (ref 14.0–49.7)
MCH: 26.7 pg (ref 25.1–34.0)
MCHC: 32.3 g/dL (ref 31.5–36.0)
MCV: 82.8 fL (ref 79.5–101.0)
MONO#: 0.7 10*3/uL (ref 0.1–0.9)
MONO%: 7 % (ref 0.0–14.0)
NEUT%: 70 % (ref 38.4–76.8)
NEUTROS ABS: 6.5 10*3/uL (ref 1.5–6.5)
Platelets: 200 10*3/uL (ref 145–400)
RBC: 4.12 10*6/uL (ref 3.70–5.45)
RDW: 14.7 % — AB (ref 11.2–14.5)
WBC: 9.3 10*3/uL (ref 3.9–10.3)

## 2014-04-04 LAB — COMPREHENSIVE METABOLIC PANEL (CC13)
ALBUMIN: 3.1 g/dL — AB (ref 3.5–5.0)
ALK PHOS: 104 U/L (ref 40–150)
ALT: <6 U/L (ref 0–55)
AST: 11 U/L (ref 5–34)
Anion Gap: 8 mEq/L (ref 3–11)
BUN: 5.9 mg/dL — AB (ref 7.0–26.0)
CALCIUM: 9.2 mg/dL (ref 8.4–10.4)
CO2: 26 mEq/L (ref 22–29)
Chloride: 109 mEq/L (ref 98–109)
Creatinine: 0.9 mg/dL (ref 0.6–1.1)
Glucose: 98 mg/dl (ref 70–140)
POTASSIUM: 3.4 meq/L — AB (ref 3.5–5.1)
Sodium: 143 mEq/L (ref 136–145)
Total Bilirubin: 0.28 mg/dL (ref 0.20–1.20)
Total Protein: 7.1 g/dL (ref 6.4–8.3)

## 2014-04-04 LAB — MAGNESIUM (CC13): Magnesium: 2.1 mg/dl (ref 1.5–2.5)

## 2014-04-04 MED ORDER — SODIUM CHLORIDE 0.9 % IJ SOLN
10.0000 mL | INTRAMUSCULAR | Status: DC | PRN
  Administered 2014-04-04: 10 mL
  Filled 2014-04-04: qty 10

## 2014-04-04 MED ORDER — ACETAMINOPHEN 325 MG PO TABS
ORAL_TABLET | ORAL | Status: AC
  Filled 2014-04-04: qty 2

## 2014-04-04 MED ORDER — SODIUM CHLORIDE 0.9 % IV SOLN
420.0000 mg | Freq: Once | INTRAVENOUS | Status: AC
  Administered 2014-04-04: 420 mg via INTRAVENOUS
  Filled 2014-04-04: qty 14

## 2014-04-04 MED ORDER — HEPARIN SOD (PORK) LOCK FLUSH 100 UNIT/ML IV SOLN
500.0000 [IU] | Freq: Once | INTRAVENOUS | Status: AC | PRN
  Administered 2014-04-04: 500 [IU]
  Filled 2014-04-04: qty 5

## 2014-04-04 MED ORDER — SODIUM CHLORIDE 0.9 % IJ SOLN
10.0000 mL | INTRAMUSCULAR | Status: DC | PRN
  Administered 2014-04-04: 10 mL via INTRAVENOUS
  Filled 2014-04-04: qty 10

## 2014-04-04 MED ORDER — ACETAMINOPHEN 325 MG PO TABS
650.0000 mg | ORAL_TABLET | Freq: Once | ORAL | Status: AC
  Administered 2014-04-04: 650 mg via ORAL

## 2014-04-04 MED ORDER — DIPHENHYDRAMINE HCL 25 MG PO CAPS
ORAL_CAPSULE | ORAL | Status: AC
  Filled 2014-04-04: qty 1

## 2014-04-04 MED ORDER — ANASTROZOLE 1 MG PO TABS: 1.0000 mg | ORAL_TABLET | Freq: Every day | ORAL | Status: DC

## 2014-04-04 MED ORDER — TRASTUZUMAB CHEMO INJECTION 440 MG
6.0000 mg/kg | Freq: Once | INTRAVENOUS | Status: AC
  Administered 2014-04-04: 420 mg via INTRAVENOUS
  Filled 2014-04-04: qty 20

## 2014-04-04 MED ORDER — GABAPENTIN 300 MG PO CAPS: 300.0000 mg | ORAL_CAPSULE | Freq: Every evening | ORAL | Status: DC | PRN

## 2014-04-04 MED ORDER — DIPHENHYDRAMINE HCL 25 MG PO CAPS
25.0000 mg | ORAL_CAPSULE | Freq: Once | ORAL | Status: AC
  Administered 2014-04-04: 25 mg via ORAL

## 2014-04-04 MED ORDER — SODIUM CHLORIDE 0.9 % IV SOLN
Freq: Once | INTRAVENOUS | Status: AC
  Administered 2014-04-04: 10:00:00 via INTRAVENOUS

## 2014-04-04 NOTE — Patient Instructions (Signed)
[facility] Discharge Instructions for Patients Receiving Chemotherapy  Today you received the following chemotherapy agents Herceptin and Perjeta.   To help prevent nausea and vomiting after your treatment, we encourage you to take your nausea medication as directed.    If you develop nausea and vomiting that is not controlled by your nausea medication, call the clinic.   BELOW ARE SYMPTOMS THAT SHOULD BE REPORTED IMMEDIATELY:  *FEVER GREATER THAN 100.5 F  *CHILLS WITH OR WITHOUT FEVER  NAUSEA AND VOMITING THAT IS NOT CONTROLLED WITH YOUR NAUSEA MEDICATION  *UNUSUAL SHORTNESS OF BREATH  *UNUSUAL BRUISING OR BLEEDING  TENDERNESS IN MOUTH AND THROAT WITH OR WITHOUT PRESENCE OF ULCERS  *URINARY PROBLEMS  *BOWEL PROBLEMS  UNUSUAL RASH Items with * indicate a potential emergency and should be followed up as soon as possible.  Feel free to call the clinic you have any questions or concerns. The clinic phone number is [phone].    

## 2014-04-04 NOTE — Progress Notes (Signed)
[place]  [phone] Fax:([phone]) [phone]     ID: Angela Gross OB: [birth year]  MR#: [id number]  IOX#:[id number]  PCP: Salena Saner., MD GYN:   SUFanny Skates OTHER MD: [name], Claire Sanger, Arnoldo Hooker [place]ma[place]  CHIEF COMPLAINT:  Right Breast Cancer/Adjuvant Therapy   HISTORY OF PRESENT ILLNESS: Angela Gross had routine screening mammography at the breast center 09/21/2013 showing a possible mass in calcifications in the right breast. On 10/11/2013 she underwent right diagnostic mammography and ultrasonography. This confirmed a spiculated 1.2 cm mass in the central right breast, with a group of pleomorphic calcifications slightly laterally. The calcifications spanning 1.6 cm. A second group of calcifications extended inferiorly and spanned 8 mm. The entire area in aggregate measured 6.5 cm. By physical exam there was no palpable abnormality. Ultrasonography showed an irregular hypoechoic mass at the 6:00 position of the right breast measuring 1 cm maximally. The right axilla was normal.  Biopsy of the right breast mass in question as well as the more lateral area of calcifications on 10/11/2013 showed ([id number]) most to be positive for an invasive ductal carcinoma, both estrogen receptor 85-90% positive with strong staining intensity, both progesterone receptor negative, with an MIB-1 of 17%. HER-2 was amplified, with a signals ratio of 3.44 and a copy number per cell 04 0.30.  Bilateral breast MRI 10/19/2013 showed again a spiculated mass in the 6:00 region of the right breast measuring 1.2 cm, a slightly more lateral hematoma associated with a second biopsy and also with clumped enhancement, and asymmetric none masslike enhancement in most of the right breast, the entire area of abnormality measuring 9.0 cm. The left breast was unremarkable and there were no abnormal appearing lymph nodes.  Her subsequent history is as detailed below    INTERVAL  HISTORY: Angela Gross returns today accompanied by her daughter for followup of her right breast cancer. As a brief recap, she was receiving adjuvant chemotherapy and completed 1 q. three-week dose of docetaxel/carboplatin given with trastuzumab/pertuzumab,  given on 02/21/2014, with Neulasta support on day 2. When seen in anticipation of cycle 2, she had developed significant peripheral neuropathy which continues, and has improved very little over the last several weeks. She received trastuzumab/pertuzumab on 03/14/2014.  At that time, Dr. Jana Hakim made the suggestion of discontinuing both the docetaxel and carboplatin and continuing only q. three-week trastuzumab/pertuzumab if her neuropathy did not improve significantly before today's visit.   Angela Gross continues to have neuropathy in both her fingers and her feet. This really has not improved significantly over the past several weeks. Her feet bother her more, often causing pain and discomfort at night, but fortunately she is having no problems with walking. She's also had some numbness and tingling in the left thigh at times. She denies any significant lower back pain. She's had no additional lower extremity weakness. She continues to have joint pain associated with rheumatoid arthritis and takes an occasional dose of oxycodone, 5 mg, at night. In the past, she tried taking tramadol with very little relief. She has never taken gabapentin.  REVIEW OF SYSTEMS: Otherwise, Angela Gross is tolerating the trastuzumab/pertuzumab combination quite well. She's had no problems with diarrhea, and is having regular bowel movements. She's eating and drinking well with no nausea or emesis. Angela Gross denies any fevers or chills but does have hot flashes, especially at night. Her nailbeds are darkening and are slightly tender to touch. She's had no additional rashes or skin changes. She denies any abnormal bruising or bleeding.  She continues to have occasional headaches which are  chronic and stable. She feels somewhat weak, although her energy level is gradually improving. She denies any dizziness and has had no change in vision. She has some sinus congestion she attributes to allergies. She currently denies any cough, phlegm production, increased shortness of breath, orthopnea, chest pain, or palpitations. She has had more swelling in the feet and ankles over the past couple of weeks, and this improved slightly with elevation.  A detailed review of systems is otherwise stable and noncontributory.    PAST MEDICAL HISTORY: Past Medical History  Diagnosis Date  . Raynaud disease   . Osteoporosis   . Hypertension   . PONV (postoperative nausea and vomiting)   . Wears glasses   . Full dentures   . Fibromyalgia   . Rheumatoid arthritis     "elbows; fingers; toes"  . Osteoarthritis of both knees   . Degenerative disc disease, cervical   . Chronic left shoulder pain     "work related injury" (01/10/2014)  . Breast cancer     "right" (01/10/2014)  . Type II diabetes mellitus     has not taken meds in over a month (01/10/2014)  . Syncope and collapse     "4 times in the last year" (01/10/2014)    PAST SURGICAL HISTORY: Past Surgical History  Procedure Laterality Date  . Colonoscopy    . Mastectomy w/ sentinel node biopsy Right 12/13/2013    Procedure: RIGHT TOTAL MASTECTOMY WITH SENTINEL LYMPH NODE BIOPSY;  Surgeon: Adin Hector, MD;  Location: [facility];  Service: General;  Laterality: Right;  . Portacath placement Right 12/13/2013    Procedure: INSERTION PORT-A-CATH;  Surgeon: Adin Hector, MD;  Location: [facility];  Service: General;  Laterality: Right;  . Breast reconstruction with placement of tissue expander and flex hd (acellular hydrated dermis) Right 12/13/2013    Procedure: RIGHT BREAST RECONSTRUCTION WITH PLACEMENT OF TISSUE EXPANDER AND FLEX HD (ACELLULAR HYDRATED DERMIS);  Surgeon: Irene Limbo, MD;  Location: [facility];  Service: Plastics;  Laterality: Right;  . Lipoma excision Bilateral 12/13/2013    Procedure: EXCISION OF LEFT BREAST KELOID AND RIGHT CHEST KELOID;  Surgeon: Irene Limbo, MD;  Location: [facility];  Service: Plastics;  Laterality: Bilateral;  . Mastectomy    . Cholecystectomy  1970's  . Vaginal hysterectomy  1980    no salpingo-oophoretomu  . Breast biopsy Right 10/2013  . Tissue expander placement Right 01/10/2014    Procedure: Incision and Drainage Right Breast Seroma with TISSUE EXPANDER exchange;  Surgeon: Irene Limbo, MD;  Location: [facility];  Service: Plastics;  Laterality: Right;    FAMILY HISTORY History reviewed. No pertinent family history. The patient is little information about her father. Her mother died from a heart attack at the age of 39. She had been diagnosed with breast cancer in her late 60s. The patient had 4 brothers and 4 sisters. There is no other history of breast or ovarian cancer in the family to her knowledge.  GYNECOLOGIC HISTORY:    (Reviewed 04/04/2014) She does not recall her age of menarche. She underwent hysterectomy in her 43s. She did not receive hormone replacement. First live birth at 49. She was GX P2.  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 She is currently disabled secondary to her rheumatoid arthritis. Her husband Angela Amass  Gross is a retired Administrator. He now works part time at [employer]. Son Angela Gross works in [place] as a Patent attorney. Daughter Angela Gross is a Social worker.    ADVANCED DIRECTIVES: Not in place   HEALTH MAINTENANCE: (Updated 04/04/2014) History  Substance Use Topics  . Smoking status: Never Smoker   . Smokeless tobacco: Never Used  . Alcohol Use: No     Colonoscopy: Not on file  PAP: Status post remote hysterectomy  Bone density: At [facility] hospital 01/05/2009  showed osteopenia with a T score of -1.7  Lipid panel: Not on file   Allergies  Allergen Reactions  . Codeine Other (See Comments) and Hives    Altered mental status, numbness  Altered mental status, Numbess  . Diazepam     Other reaction(s): Delusions (intolerance) hallucinations     Current Outpatient Prescriptions  Medication Sig Dispense Refill  . cyclobenzaprine (FLEXERIL) 10 MG tablet Take 10 mg by mouth 3 (three) times daily as needed for muscle spasms.      Marland Kitchen lidocaine-prilocaine (EMLA) cream Apply 1 application topically as needed. 1-2 hrs before port access  30 g  4  . Liraglutide (VICTOZA) 18 MG/3ML SOPN Inject 0.6 mg into the skin daily.       Marland Kitchen oxyCODONE (ROXICODONE) 5 MG immediate release tablet Take 1 tablet (5 mg total) by mouth every 4 (four) hours as needed for moderate pain or severe pain.  20 tablet  0  . traMADol (ULTRAM) 50 MG tablet Take 50 mg by mouth 3 (three) times daily as needed (pain).       . Vitamin D, Ergocalciferol, (DRISDOL) 50000 UNITS CAPS capsule Take 50,000 Units by mouth every 7 (seven) days. Wednesdays      . Alum & Mag Hydroxide-Simeth (MAGIC MOUTHWASH W/LIDOCAINE) SOLN Take 5 mLs by mouth 4 (four) times daily as needed for mouth pain. Gargle, swish and spit  360 mL  1  . anastrozole (ARIMIDEX) 1 MG tablet Take 1 tablet (1 mg total) by mouth daily.  30 tablet  5  . cholestyramine (QUESTRAN) 4 G packet Take 1 packet (4 g total) by mouth 2 (two) times daily. For diarrhea.  Mix with beverage or applesauce.  60 each  1  . dexamethasone (DECADRON) 4 MG tablet 2 tabs by mouth with food twice daily on day before and 3 days after each chemo  30 tablet  1  . gabapentin (NEURONTIN) 300 MG capsule Take 1 capsule (300 mg total) by mouth at bedtime as needed. For neuropathy and hot flashes  30 capsule  5  . LORazepam (ATIVAN) 0.5 MG tablet Take 0.5-1 tablets (0.25-0.5 mg total) by mouth at bedtime as needed for anxiety or sleep (or nausea).  30 tablet  0  .  ondansetron (ZOFRAN) 8 MG tablet Take 8 mg by mouth See admin instructions. 1 tab by mouth twice daily x 3 days after each chemo, then 1 tab by mouth every 12 hrs if needed for nausea      . prochlorperazine (COMPAZINE) 10 MG tablet Take 10 mg by mouth once. 1 tab by mouth with meals and at bedtime x 3 days after each chemo, then 1 tab by mouth every 6 hrs if needed for nausea       No current facility-administered medications for this visit.   Facility-Administered Medications Ordered in Other Visits  Medication Dose Route Frequency Provider Last Rate Last Dose  . sodium chloride 0.9 % injection 10 mL  10 mL Intravenous PRN Theotis Burrow, PA-C   10 mL at 03/01/14 1425  . sodium chloride 0.9 % injection 10 mL  10 mL Intravenous PRN Chauncey Cruel, MD   10 mL at 03/01/14 0934  . sodium chloride 0.9 % injection 10 mL  10 mL Intracatheter PRN Theotis Burrow, PA-C   10 mL at 04/04/14 1203    OBJECTIVE:   Filed Vitals:   04/04/14 0839  BP: 121/84  Pulse: 106  Temp: 98.2 F (36.8 C)  Resp: 18     Body mass index is 28.14 kg/(m^2).    ECOG FS: 1 Middle-aged African American woman in no acute distress Filed Weights   04/04/14 0839  Weight: 144 lb 1.6 oz (65.363 kg)   Physical Exam: HEENT:  Sclerae anicteric.  Oropharynx is pink, moist, and clear. No ulcerations noted. No evidence of mucositis or oropharyngeal candidiasis. Neck supple, trachea midline. NODES:  No cervical or supraclavicular lymphadenopathy palpated.  BREAST EXAM: Breast exam was deferred. Axillae are benign bilaterally, with no palpable lymphadenopathy. LUNGS:  Clear to auscultation bilaterally with good excursion.  No crackles, wheezes, or rhonchi HEART:  Regular rate and rhythm. No murmur appreciated. ABDOMEN:  Soft, nontender. No organomegaly or masses palpated. Positive bowel sounds.  MSK:  No focal spinal tenderness to palpation. Good range of motion bilaterally in the upper extremities. EXTREMITIES:  1+ pitting edema  bilaterally in the lower extremities, equal bilaterally. No erythema, warmth, or palpable cords.  No lymphedema in the right upper extremity. SKIN:  Benign with no visible rashes and only mild hyperpigmentation on the hands and feet. The nail beds are also hyperpigmented on the hands, but no additional nail dyscrasia is noted.  No excessive ecchymoses. No petechiae. No pallor. Skin is warm and dry. NEURO:  Nonfocal. Well oriented.  Appropriate affect.    LAB RESULTS:   Lab Results  Component Value Date   WBC 9.3 04/04/2014   NEUTROABS 6.5 04/04/2014   HGB 11.0* 04/04/2014   HCT 34.1* 04/04/2014   MCV 82.8 04/04/2014   PLT 200 04/04/2014      Chemistry      Component Value Date/Time   NA 143 04/04/2014 0958   NA 138 01/09/2014 1522   K 3.4* 04/04/2014 0958   K 4.2 01/09/2014 1522   CL 100 01/09/2014 1522   CO2 26 04/04/2014 0958   CO2 25 01/09/2014 1522   BUN 5.9* 04/04/2014 0958   BUN 11 01/09/2014 1522   CREATININE 0.9 04/04/2014 0958   CREATININE 0.99 01/09/2014 1522      Component Value Date/Time   CALCIUM 9.2 04/04/2014 0958   CALCIUM 8.5 01/09/2014 1522   ALKPHOS 104 04/04/2014 0958   ALKPHOS 102 12/09/2013 1400   AST 11 04/04/2014 0958   AST 36 12/09/2013 1400   ALT <6 04/04/2014 0958   ALT 30 12/09/2013 1400   BILITOT 0.28 04/04/2014 0958   BILITOT 0.2* 12/09/2013 1400       STUDIES:  Echocardiogram on 12/20/2013 showed an ejection fraction of 55-60%.  This is due to be repeated in early August, as first dose of trastuzumab and pertuzumab was not given until 02/21/2014.  DG CHEST PORT 1 VIEW 03/03/215   CLINICAL DATA: Port-A-Cath placement.  EXAM:  PORTABLE CHEST - 1 VIEW  COMPARISON: 12/09/2013  FINDINGS:  There has been placement of a right subclavian Port-A-Cath which has  tip at the cavoatrial junction. No definite right pneumothorax.  There is a small caliber  catheter projected over the right chest  with tip just right of midline at the level of the hilum which may  be  related to patient's recent right breast surgery. Lungs are  somewhat hypoinflated with minimal prominence of the perihilar  markings suggesting minimal vascular congestion. There is subtle  opacification over the lateral left base likely atelectasis.  Cardiomediastinal silhouette is within normal. There is a small  right cervical rib. There are surgical clips over the right axilla.  Remainder of the exam is unchanged.  IMPRESSION:  Minimal prominence of the perihilar markings suggesting minimal  vascular congestion. Possible minimal left basilar atelectasis.  Right subclavian Port-A-Cath with tip at the region of the  cavoatrial junction. No definite pneumothorax.  Small caliber catheter over the right chest with tip just right of  midline as this may represent a surgical drain related to patient's  recent breast surgery.  Electronically Signed  By: Marin Olp M.D.  On: 12/13/2013 13:27    ASSESSMENT: 63 y.o. Angela Gross woman   (1)  s/p biopsy of separate Right breast masses 10/11/2013 for a clinical mT1 N0, stage IA invasive ductal carcinoma, grade II-III, one of the mass is being 85% estrogen receptor positive, progesterone receptor negative, with an MIB-1 of 17% and HER-2 amplified.  (2) status post right mastectomy and sentinel lymph node sampling with immediate expander placement 12/13/2013 for a pT1c pN0 invasive ductal carcinoma, grade 3, again HER-2 positive.  (3) Being treated in the adjuvant setting with carboplatin, docetaxel, trastuzumab and pertuzumab, first dose on 02/21/2014. Neulasta on day 2 for granulocyte support. The plan is to repeat these agents every 21 days x6, after which the anti-HER-2 treatment (both trastuzumab and pertuzumab) will be continued to the total one year. Patient developed significant peripheral neuropathy after one cycle of chemotherapy, and both carboplatin and docetaxel were held beginning with cycle 2.  (4)  For cycles 2 through 6, patient  receiving only q. three-week trastuzumab/pertuzumab.  After a total of 6 cycles of trastuzumab/pertuzumab the pertuzumab will be discontinued, and single agent trastuzumab will be continued every 3 weeks for total of one year (until May 2016)  (5)  The patient started on anastrozole on 04/04/2014 after discontinuing chemotherapy with docetaxel/carboplatin. The plan is to continue for total of 5 years, until June 2020.   (6) The patient will not need postmastectomy radiation  (7) Rheumatoid arthritis, usually on Remicade, prednisone and methotrexate, held prior to mastectomy.  (8)  Hx of cellulitis affecting the right reconstructed breast diagnosed at 01/09/2014 with subsequent surgery to drain the seroma and exchange tissue expander - Resolved  (9) chemotherapy-induced peripheral neuropathy  - to be treated with gabapentin, 300 mg at bedtime, and tramadol, 50 mg every 6 hours as needed.     PLAN: The majority of our 40 minute appointment today was spent reviewing Ariyonna's concerns, discussing her treatment options, reviewing the benefits as well as possible side effects associated with anastrozole, and coordinating care.  As discussed above, we will hold both docetaxel and carboplatin, and Tatelyn will receive trastuzumab/pertuzumab  every 3 weeks until she completes a total of 6 cycles. This should be in late August.  At that time we will discontinue the pertuzumab, continuing only with trastuzumab every 3 weeks for total of one year.    In the meanwhile, she will also start on anastrozole, 1 mg daily, for her ER positive breast cancer. We did discuss possible side effects, including hot flashes (which she already has at night),  vaginal dryness, joint pain, and increased osteoporosis. She does tell me she has a history of osteoporosis, so we will need to get a new baseline bone density and assess whether or not the osteoporosis needs to be treated as well.  With regards to her peripheral  neuropathy, now that she has discontinued the docetaxel altogether, I am comfortable with prescribing gabapentin, 300 mg at bedtime. She understands the medication we'll likely make her sleepy, but hopefully it will help with both the neuropathy and the hot flashes she experiences at night. She will discontinue the oxycodone. She has some tramadol on hand at home and understands that  taking both the gabapentin and tramadol often decreases neuropathic pain and has a better result than taking oxycodone alone.  She was cautioned about taking the tramadol during the day, certainly if she will be driving. She knows that might make her sleepy.   I have reviewed this plan in detail with the patient and her daughter today, and they were also given all of this information in writing.  They are both very much in favor of this plan, and voiced their understanding and agreement. They'll call with any changes or problems prior to her next scheduled appointment here which will be July 14. As noted above, her echocardiogram will be repeated in August.    Elanora Quin, PA-C   04/04/2014 2:52 PM

## 2014-04-04 NOTE — Telephone Encounter (Signed)
[facility] PT APPT SCHEDULE FOR JULY/AUG WHILE IN INF AREA. [place] [facility] REQUESTING APPT FOR ECHO/[facility] EARLY AUGUST. CLINIC WILL CALL PT.

## 2014-04-04 NOTE — Telephone Encounter (Signed)
Left message on pt cell phone to call back for echo/clinic appt

## 2014-04-05 ENCOUNTER — Ambulatory Visit: Payer: Medicare Other

## 2014-04-11 ENCOUNTER — Ambulatory Visit: Payer: Medicare Other | Admitting: Physician Assistant

## 2014-04-11 ENCOUNTER — Other Ambulatory Visit: Payer: Medicare Other

## 2014-04-11 ENCOUNTER — Telehealth: Payer: Self-pay | Admitting: Oncology

## 2014-04-11 NOTE — Telephone Encounter (Signed)
S/W PT RE ECHO 7/27 AND APPT W/[facility] 7/30. BOTH APPTS @ DR BENSIMHON'S OFFICE. PT GIVEN APPT SCHEDULE FOR OTHER APPTS PRIOR TO LEAVING 04/04/14.

## 2014-04-12 NOTE — Telephone Encounter (Signed)
appts sch

## 2014-04-16 ENCOUNTER — Encounter: Payer: Self-pay | Admitting: Oncology

## 2014-04-25 ENCOUNTER — Ambulatory Visit (HOSPITAL_BASED_OUTPATIENT_CLINIC_OR_DEPARTMENT_OTHER): Payer: Medicare Other

## 2014-04-25 ENCOUNTER — Other Ambulatory Visit: Payer: Medicare Other

## 2014-04-25 ENCOUNTER — Ambulatory Visit (HOSPITAL_BASED_OUTPATIENT_CLINIC_OR_DEPARTMENT_OTHER): Payer: Medicare Other | Admitting: Oncology

## 2014-04-25 ENCOUNTER — Telehealth: Payer: Self-pay | Admitting: Oncology

## 2014-04-25 ENCOUNTER — Other Ambulatory Visit (HOSPITAL_BASED_OUTPATIENT_CLINIC_OR_DEPARTMENT_OTHER): Payer: Medicare Other

## 2014-04-25 ENCOUNTER — Encounter: Payer: Self-pay | Admitting: Oncology

## 2014-04-25 VITALS — BP 128/81 | HR 112 | Temp 97.7°F | Resp 18 | Ht 60.0 in | Wt 144.8 lb

## 2014-04-25 DIAGNOSIS — C50919 Malignant neoplasm of unspecified site of unspecified female breast: Secondary | ICD-10-CM

## 2014-04-25 DIAGNOSIS — Z170 Estrogen receptor positive status [ER+]: Secondary | ICD-10-CM

## 2014-04-25 DIAGNOSIS — G620 Drug-induced polyneuropathy: Secondary | ICD-10-CM

## 2014-04-25 DIAGNOSIS — C50311 Malignant neoplasm of lower-inner quadrant of right female breast: Secondary | ICD-10-CM

## 2014-04-25 DIAGNOSIS — Z95828 Presence of other vascular implants and grafts: Secondary | ICD-10-CM

## 2014-04-25 DIAGNOSIS — M069 Rheumatoid arthritis, unspecified: Secondary | ICD-10-CM

## 2014-04-25 DIAGNOSIS — M2550 Pain in unspecified joint: Secondary | ICD-10-CM

## 2014-04-25 DIAGNOSIS — Z5112 Encounter for antineoplastic immunotherapy: Secondary | ICD-10-CM

## 2014-04-25 LAB — CBC WITH DIFFERENTIAL/PLATELET
BASO%: 0.1 % (ref 0.0–2.0)
BASOS ABS: 0 10*3/uL (ref 0.0–0.1)
EOS ABS: 0.1 10*3/uL (ref 0.0–0.5)
EOS%: 0.9 % (ref 0.0–7.0)
HCT: 37.1 % (ref 34.8–46.6)
HEMOGLOBIN: 11.8 g/dL (ref 11.6–15.9)
LYMPH%: 28.2 % (ref 14.0–49.7)
MCH: 26 pg (ref 25.1–34.0)
MCHC: 31.8 g/dL (ref 31.5–36.0)
MCV: 81.9 fL (ref 79.5–101.0)
MONO#: 0.6 10*3/uL (ref 0.1–0.9)
MONO%: 8.1 % (ref 0.0–14.0)
NEUT%: 62.7 % (ref 38.4–76.8)
NEUTROS ABS: 4.8 10*3/uL (ref 1.5–6.5)
Platelets: 182 10*3/uL (ref 145–400)
RBC: 4.53 10*6/uL (ref 3.70–5.45)
RDW: 14.5 % (ref 11.2–14.5)
WBC: 7.6 10*3/uL (ref 3.9–10.3)
lymph#: 2.2 10*3/uL (ref 0.9–3.3)

## 2014-04-25 LAB — COMPREHENSIVE METABOLIC PANEL (CC13)
ALT: 6 U/L (ref 0–55)
ANION GAP: 10 meq/L (ref 3–11)
AST: 12 U/L (ref 5–34)
Albumin: 3.3 g/dL — ABNORMAL LOW (ref 3.5–5.0)
Alkaline Phosphatase: 102 U/L (ref 40–150)
BUN: 6 mg/dL — ABNORMAL LOW (ref 7.0–26.0)
CALCIUM: 9.3 mg/dL (ref 8.4–10.4)
CHLORIDE: 108 meq/L (ref 98–109)
CO2: 24 meq/L (ref 22–29)
Creatinine: 1 mg/dL (ref 0.6–1.1)
Glucose: 134 mg/dl (ref 70–140)
POTASSIUM: 3.4 meq/L — AB (ref 3.5–5.1)
SODIUM: 142 meq/L (ref 136–145)
Total Bilirubin: 0.33 mg/dL (ref 0.20–1.20)
Total Protein: 7.3 g/dL (ref 6.4–8.3)

## 2014-04-25 LAB — MAGNESIUM (CC13): Magnesium: 1.9 mg/dl (ref 1.5–2.5)

## 2014-04-25 MED ORDER — ACETAMINOPHEN 325 MG PO TABS
650.0000 mg | ORAL_TABLET | Freq: Once | ORAL | Status: AC
  Administered 2014-04-25: 650 mg via ORAL

## 2014-04-25 MED ORDER — ACETAMINOPHEN 325 MG PO TABS
ORAL_TABLET | ORAL | Status: AC
  Filled 2014-04-25: qty 2

## 2014-04-25 MED ORDER — PERTUZUMAB CHEMO INJECTION 420 MG/14ML
420.0000 mg | Freq: Once | INTRAVENOUS | Status: AC
  Administered 2014-04-25: 420 mg via INTRAVENOUS
  Filled 2014-04-25: qty 14

## 2014-04-25 MED ORDER — TRASTUZUMAB CHEMO INJECTION 440 MG
6.0000 mg/kg | Freq: Once | INTRAVENOUS | Status: AC
  Administered 2014-04-25: 420 mg via INTRAVENOUS
  Filled 2014-04-25: qty 20

## 2014-04-25 MED ORDER — DIPHENHYDRAMINE HCL 25 MG PO CAPS
ORAL_CAPSULE | ORAL | Status: AC
  Filled 2014-04-25: qty 1

## 2014-04-25 MED ORDER — SODIUM CHLORIDE 0.9 % IJ SOLN
10.0000 mL | INTRAMUSCULAR | Status: DC | PRN
  Administered 2014-04-25: 10 mL
  Filled 2014-04-25: qty 10

## 2014-04-25 MED ORDER — SODIUM CHLORIDE 0.9 % IJ SOLN
10.0000 mL | INTRAMUSCULAR | Status: DC | PRN
  Administered 2014-04-25: 10 mL via INTRAVENOUS
  Filled 2014-04-25: qty 10

## 2014-04-25 MED ORDER — HEPARIN SOD (PORK) LOCK FLUSH 100 UNIT/ML IV SOLN
500.0000 [IU] | Freq: Once | INTRAVENOUS | Status: AC | PRN
  Administered 2014-04-25: 500 [IU]
  Filled 2014-04-25: qty 5

## 2014-04-25 MED ORDER — DIPHENHYDRAMINE HCL 25 MG PO CAPS
25.0000 mg | ORAL_CAPSULE | Freq: Once | ORAL | Status: AC
  Administered 2014-04-25: 25 mg via ORAL

## 2014-04-25 MED ORDER — SODIUM CHLORIDE 0.9 % IV SOLN
Freq: Once | INTRAVENOUS | Status: AC
  Administered 2014-04-25: 12:00:00 via INTRAVENOUS

## 2014-04-25 NOTE — Progress Notes (Signed)
[place]  [phone] [phone]     ID: Angela Gross OB: 1951-05-30  MR#: [id number]  JJO#:[id number]  PCP: Salena Saner., MD GYN:   SUFanny Skates OTHER MD: [name], Lyndee Leo Sanger, Arnoldo Hooker [place]ma[place]  CHIEF COMPLAINT:  Right Breast Cancer/Adjuvant Therapy   HISTORY OF PRESENT ILLNESS: [name] had routine screening mammography at the breast center 09/21/2013 showing a possible mass in calcifications in the right breast. On 10/11/2013 she underwent right diagnostic mammography and ultrasonography. This confirmed a spiculated 1.2 cm mass in the central right breast, with a group of pleomorphic calcifications slightly laterally. The calcifications spanning 1.6 cm. A second group of calcifications extended inferiorly and spanned 8 mm. The entire area in aggregate measured 6.5 cm. By physical exam there was no palpable abnormality. Ultrasonography showed an irregular hypoechoic mass at the 6:00 position of the right breast measuring 1 cm maximally. The right axilla was normal.  Biopsy of the right breast mass in question as well as the more lateral area of calcifications on 10/11/2013 showed ([id number]) most to be positive for an invasive ductal carcinoma, both estrogen receptor 85-90% positive with strong staining intensity, both progesterone receptor negative, with an MIB-1 of 17%. HER-2 was amplified, with a signals ratio of 3.44 and a copy number per cell 04 0.30.  Bilateral breast MRI 10/19/2013 showed again a spiculated mass in the 6:00 region of the right breast measuring 1.2 cm, a slightly more lateral hematoma associated with a second biopsy and also with clumped enhancement, and asymmetric none masslike enhancement in most of the right breast, the entire area of abnormality measuring 9.0 cm. The left breast was unremarkable and there were no abnormal appearing lymph nodes.  Her subsequent history is as detailed below    INTERVAL  HISTORY: Angela Gross returns today accompanied by her daughter for followup of her right breast cancer. As a brief recap, she was receiving adjuvant chemotherapy and completed 1 q. three-week dose of docetaxel/carboplatin given with trastuzumab/pertuzumab,  given on 02/21/2014, with Neulasta support on day 2. When seen in anticipation of cycle 2, she had developed significant peripheral neuropathy which continues, and has improved very little over the last several weeks. She received trastuzumab/pertuzumab on 03/14/2014.  At that time, Dr. Jana Hakim made the suggestion of discontinuing both the docetaxel and carboplatin and continuing only q. three-week trastuzumab/pertuzumab if her neuropathy did not improve. Since there was no change in the neuropathy, the carboplatin and docetaxel were not restarted.  Walda continues to have neuropathy in both her fingers and her feet. This really has not improved significantly over the past several weeks. Her feet bother her more, often causing pain and discomfort at night, but fortunately she is having no problems with walking. She started taking Neurontin and has not seen much change yet. She denies any significant lower back pain. She's had no additional lower extremity weakness. She continues to have joint pain associated with rheumatoid arthritis and take Tramadol as needed. She started anastrazole 3 weeks ago.  REVIEW OF SYSTEMS: Otherwise, Angela Gross is tolerating the trastuzumab/pertuzumab combination quite well. She's had no problems with diarrhea, and is having regular bowel movements. She's eating and drinking well with no nausea or emesis. She has not noticed any new side effects from the anastrazole. No worsening of her hot flashes or her arthralgias have been noted. [name] denies any fevers or chills but does have hot flashes, especially at night. Her nailbeds are darkening and are slightly tender to touch.  She's had no additional rashes or skin changes. She  denies any abnormal bruising or bleeding. She continues to have occasional headaches which are chronic and stable. She feels somewhat weak, although her energy level is gradually improving. She denies any dizziness and has had no change in vision. She has some sinus congestion she attributes to allergies. She currently denies any cough, phlegm production, increased shortness of breath, orthopnea, chest pain, or palpitations. She has had more swelling in the feet and ankles over the past couple of weeks, and this improved slightly with elevation.  A detailed review of systems is otherwise stable and noncontributory.    PAST MEDICAL HISTORY: Past Medical History  Diagnosis Date  . Raynaud disease   . Osteoporosis   . Hypertension   . PONV (postoperative nausea and vomiting)   . Wears glasses   . Full dentures   . Fibromyalgia   . Rheumatoid arthritis     "elbows; fingers; toes"  . Osteoarthritis of both knees   . Degenerative disc disease, cervical   . Chronic left shoulder pain     "work related injury" (01/10/2014)  . Breast cancer     "right" (01/10/2014)  . Type II diabetes mellitus     has not taken meds in over a month (01/10/2014)  . Syncope and collapse     "4 times in the last year" (01/10/2014)    PAST SURGICAL HISTORY: Past Surgical History  Procedure Laterality Date  . Colonoscopy    . Mastectomy w/ sentinel node biopsy Right 12/13/2013    Procedure: RIGHT TOTAL MASTECTOMY WITH SENTINEL LYMPH NODE BIOPSY;  Surgeon: Adin Hector, MD;  Location: [facility];  Service: General;  Laterality: Right;  . Portacath placement Right 12/13/2013    Procedure: INSERTION PORT-A-CATH;  Surgeon: Adin Hector, MD;  Location: [facility];  Service: General;  Laterality: Right;  . Breast reconstruction with placement of tissue expander and flex hd (acellular hydrated dermis) Right 12/13/2013    Procedure: RIGHT BREAST RECONSTRUCTION WITH PLACEMENT OF TISSUE  EXPANDER AND FLEX HD (ACELLULAR HYDRATED DERMIS);  Surgeon: Irene Limbo, MD;  Location: [facility];  Service: Plastics;  Laterality: Right;  . Lipoma excision Bilateral 12/13/2013    Procedure: EXCISION OF LEFT BREAST KELOID AND RIGHT CHEST KELOID;  Surgeon: Irene Limbo, MD;  Location: [facility];  Service: Plastics;  Laterality: Bilateral;  . Mastectomy    . Cholecystectomy  1970's  . Vaginal hysterectomy  1980    no salpingo-oophoretomu  . Breast biopsy Right 10/2013  . Tissue expander placement Right 01/10/2014    Procedure: Incision and Drainage Right Breast Seroma with TISSUE EXPANDER exchange;  Surgeon: Irene Limbo, MD;  Location: [facility];  Service: Plastics;  Laterality: Right;    FAMILY HISTORY History reviewed. No pertinent family history. The patient is little information about her father. Her mother died from a heart attack at the age of 65. She had been diagnosed with breast cancer in her late 67s. The patient had 4 brothers and 4 sisters. There is no other history of breast or ovarian cancer in the family to her knowledge.  GYNECOLOGIC HISTORY:    (Reviewed 04/04/2014) She does not recall her age of menarche. She underwent hysterectomy in her 55s. She did not receive hormone replacement. First live birth at 33. She was GX P2.  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 She is currently disabled secondary to her rheumatoid arthritis. Her husband Sarin Comunale is a retired Administrator. He now works part time at [employer]. Son [name] works in [place] as a Patent attorney. Daughter [name] is a Social worker.    ADVANCED DIRECTIVES: Not in place   HEALTH MAINTENANCE: (Updated 04/04/2014) History  Substance Use Topics  . Smoking status: Never Smoker   . Smokeless tobacco: Never Used  . Alcohol Use: No     Colonoscopy:  Not on file  PAP: Status post remote hysterectomy  Bone density: At [facility] 01/05/2009 showed osteopenia with a T score of -1.7  Lipid panel: Not on file   Allergies  Allergen Reactions  . Codeine Other (See Comments) and Hives    Altered mental status, numbness  Altered mental status, Numbess  . Diazepam     Other reaction(s): Delusions (intolerance) hallucinations     Current Outpatient Prescriptions  Medication Sig Dispense Refill  . anastrozole (ARIMIDEX) 1 MG tablet Take 1 tablet (1 mg total) by mouth daily.  30 tablet  5  . cyclobenzaprine (FLEXERIL) 10 MG tablet Take 10 mg by mouth 3 (three) times daily as needed for muscle spasms.      Marland Kitchen gabapentin (NEURONTIN) 300 MG capsule Take 1 capsule (300 mg total) by mouth at bedtime as needed. For neuropathy and hot flashes  30 capsule  5  . lidocaine-prilocaine (EMLA) cream Apply 1 application topically as needed. 1-2 hrs before port access  30 g  4  . traMADol (ULTRAM) 50 MG tablet Take 50 mg by mouth 3 (three) times daily as needed (pain).       . Vitamin D, Ergocalciferol, (DRISDOL) 50000 UNITS CAPS capsule Take 50,000 Units by mouth every 7 (seven) days. Wednesdays      . Alum & Mag Hydroxide-Simeth (MAGIC MOUTHWASH W/LIDOCAINE) SOLN Take 5 mLs by mouth 4 (four) times daily as needed for mouth pain. Gargle, swish and spit  360 mL  1  . cholestyramine (QUESTRAN) 4 G packet Take 1 packet (4 g total) by mouth 2 (two) times daily. For diarrhea.  Mix with beverage or applesauce.  60 each  1  . dexamethasone (DECADRON) 4 MG tablet 2 tabs by mouth with food twice daily on day before and 3 days after each chemo  30 tablet  1  . Liraglutide (VICTOZA) 18 MG/3ML SOPN Inject 0.6 mg into the skin daily.       Marland Kitchen LORazepam (ATIVAN) 0.5 MG tablet Take 0.5-1 tablets (0.25-0.5 mg total) by mouth at bedtime as needed for anxiety or sleep (or nausea).  30 tablet  0  . ondansetron (ZOFRAN) 8 MG tablet Take 8 mg by mouth See admin instructions.  1 tab by mouth twice daily x 3 days after each chemo, then 1 tab by mouth every 12 hrs if needed for nausea      . oxyCODONE (ROXICODONE) 5 MG immediate release tablet Take 1 tablet (5 mg total) by mouth every 4 (four) hours as needed for moderate pain or severe pain.  20 tablet  0  . prochlorperazine (COMPAZINE) 10 MG tablet Take 10 mg by mouth once. 1 tab by mouth with meals and at bedtime x 3 days after each chemo, then 1 tab by mouth every 6 hrs if needed for nausea       No current facility-administered medications for this visit.   Facility-Administered Medications Ordered in Other Visits  Medication Dose Route  Frequency Provider Last Rate Last Dose  . heparin lock flush 100 unit/mL  500 Units Intracatheter Once PRN Amy Milda Smart, PA-C      . pertuzumab (PERJETA) 420 mg in sodium chloride 0.9 % 250 mL chemo infusion  420 mg Intravenous Once Amy G Berry, PA-C      . sodium chloride 0.9 % injection 10 mL  10 mL Intravenous PRN Amy G Berry, PA-C   10 mL at 03/01/14 1425  . sodium chloride 0.9 % injection 10 mL  10 mL Intravenous PRN Chauncey Cruel, MD   10 mL at 03/01/14 0934  . sodium chloride 0.9 % injection 10 mL  10 mL Intracatheter PRN Amy G Berry, PA-C      . trastuzumab (HERCEPTIN) 420 mg in sodium chloride 0.9 % 250 mL chemo infusion  6 mg/kg (Treatment Plan Actual) Intravenous Once Amy G Berry, PA-C        OBJECTIVE:   Filed Vitals:   04/25/14 1021  BP: 128/81  Pulse: 112  Temp: 97.7 F (36.5 C)  Resp: 18     Body mass index is 28.28 kg/(m^2).    ECOG FS: 1 Middle-aged African American woman in no acute distress Filed Weights   04/25/14 1021  Weight: 144 lb 12.8 oz (65.681 kg)   Physical Exam: HEENT:  Sclerae anicteric.  Oropharynx is pink, moist, and clear. No ulcerations noted. No evidence of mucositis or oropharyngeal candidiasis. Neck supple, trachea midline. NODES:  No cervical or supraclavicular lymphadenopathy palpated.  BREAST EXAM: Breast exam was deferred.  Axillae are benign bilaterally, with no palpable lymphadenopathy. LUNGS:  Clear to auscultation bilaterally with good excursion.  No crackles, wheezes, or rhonchi HEART:  Regular rate and rhythm. No murmur appreciated. ABDOMEN:  Soft, nontender. No organomegaly or masses palpated. Positive bowel sounds.  MSK:  No focal spinal tenderness to palpation. Good range of motion bilaterally in the upper extremities. EXTREMITIES:  1+ pitting edema bilaterally in the lower extremities, equal bilaterally. No erythema, warmth, or palpable cords.  No lymphedema in the right upper extremity. SKIN:  Benign with no visible rashes and only mild hyperpigmentation on the hands and feet. The nail beds are also hyperpigmented on the hands, but no additional nail dyscrasia is noted.  No excessive ecchymoses. No petechiae. No pallor. Skin is warm and dry. NEURO:  Nonfocal. Well oriented.  Appropriate affect.    LAB RESULTS:   Lab Results  Component Value Date   WBC 7.6 04/25/2014   NEUTROABS 4.8 04/25/2014   HGB 11.8 04/25/2014   HCT 37.1 04/25/2014   MCV 81.9 04/25/2014   PLT 182 04/25/2014      Chemistry      Component Value Date/Time   NA 142 04/25/2014 0954   NA 138 01/09/2014 1522   K 3.4* 04/25/2014 0954   K 4.2 01/09/2014 1522   CL 100 01/09/2014 1522   CO2 24 04/25/2014 0954   CO2 25 01/09/2014 1522   BUN 6.0* 04/25/2014 0954   BUN 11 01/09/2014 1522   CREATININE 1.0 04/25/2014 0954   CREATININE 0.99 01/09/2014 1522      Component Value Date/Time   CALCIUM 9.3 04/25/2014 0954   CALCIUM 8.5 01/09/2014 1522   ALKPHOS 102 04/25/2014 0954   ALKPHOS 102 12/09/2013 1400   AST 12 04/25/2014 0954   AST 36 12/09/2013 1400   ALT 6 04/25/2014 0954   ALT 30 12/09/2013 1400   BILITOT 0.33 04/25/2014 0954   BILITOT 0.2* 12/09/2013 1400  STUDIES:  Echocardiogram on 12/20/2013 showed an ejection fraction of 55-60%.  This is due to be repeated in early August, as first dose of trastuzumab and pertuzumab was not  given until 02/21/2014.  DG CHEST PORT 1 VIEW 03/03/215   CLINICAL DATA: Port-A-Cath placement.  EXAM:  PORTABLE CHEST - 1 VIEW  COMPARISON: 12/09/2013  FINDINGS:  There has been placement of a right subclavian Port-A-Cath which has  tip at the cavoatrial junction. No definite right pneumothorax.  There is a small caliber catheter projected over the right chest  with tip just right of midline at the level of the hilum which may  be related to patient's recent right breast surgery. Lungs are  somewhat hypoinflated with minimal prominence of the perihilar  markings suggesting minimal vascular congestion. There is subtle  opacification over the lateral left base likely atelectasis.  Cardiomediastinal silhouette is within normal. There is a small  right cervical rib. There are surgical clips over the right axilla.  Remainder of the exam is unchanged.  IMPRESSION:  Minimal prominence of the perihilar markings suggesting minimal  vascular congestion. Possible minimal left basilar atelectasis.  Right subclavian Port-A-Cath with tip at the region of the  cavoatrial junction. No definite pneumothorax.  Small caliber catheter over the right chest with tip just right of  midline as this may represent a surgical drain related to patient's  recent breast surgery.  Electronically Signed  By: Marin Olp M.D.  On: 12/13/2013 13:27    ASSESSMENT: 63 y.o. [place] woman   (1)  s/p biopsy of separate Right breast masses 10/11/2013 for a clinical mT1 N0, stage IA invasive ductal carcinoma, grade II-III, one of the mass is being 85% estrogen receptor positive, progesterone receptor negative, with an MIB-1 of 17% and HER-2 amplified.  (2) status post right mastectomy and sentinel lymph node sampling with immediate expander placement 12/13/2013 for a pT1c pN0 invasive ductal carcinoma, grade 3, again HER-2 positive.  (3) Being treated in the adjuvant setting with carboplatin, docetaxel,  trastuzumab and pertuzumab, first dose on 02/21/2014. Neulasta on day 2 for granulocyte support. The plan is to repeat these agents every 21 days x6, after which the anti-HER-2 treatment (both trastuzumab and pertuzumab) will be continued to the total one year. Patient developed significant peripheral neuropathy after one cycle of chemotherapy, and both carboplatin and docetaxel were held beginning with cycle 2.  (4)  For cycles 2 through 6, patient receiving only q. three-week trastuzumab/pertuzumab.  After a total of 6 cycles of trastuzumab/pertuzumab the pertuzumab will be discontinued, and single agent trastuzumab will be continued every 3 weeks for total of one year (until May 2016)  (5)  The patient started on anastrozole on 04/04/2014 after discontinuing chemotherapy with docetaxel/carboplatin. The plan is to continue for total of 5 years, until June 2020.   (6) The patient will not need postmastectomy radiation  (7) Rheumatoid arthritis, usually on Remicade, prednisone and methotrexate, held prior to mastectomy.  (8)  Hx of cellulitis affecting the right reconstructed breast diagnosed at 01/09/2014 with subsequent surgery to drain the seroma and exchange tissue expander - Resolved  (9) chemotherapy-induced peripheral neuropathy  - to be treated with gabapentin, 300 mg at bedtime, and tramadol, 50 mg every 6 hours as needed.     PLAN:   Mikiala is tolerating the Herceptin/Perjeta and Anastazole well. She will proceed with the 4 th dose of Herceptin and Perjeta today and continue the anastrazole. The pan is for trastuzumab/pertuzumab  every 3 weeks  until she completes a total of 6 cycles. This should be in late August.  At that time we will discontinue the pertuzumab, continuing only with trastuzumab every 3 weeks for total of one year.    For her neuropathy, she will continue Neurontin 300 mg at bedtime.   I have reviewed this plan in detail with the patient and her daughter today, and  they were also given all of this information in writing.  They are both very much in favor of this plan, and voiced their understanding and agreement. They'll call with any changes or problems prior to her next scheduled appointment here which will be August 4th. As noted above, her echocardiogram will be repeated in late July.    Mikey Bussing, NP   04/25/2014 12:17 PM

## 2014-04-25 NOTE — Patient Instructions (Signed)
[facility] Discharge Instructions for Patients Receiving Chemotherapy  Today you received the following chemotherapy agents herceptin and perjeta  To help prevent nausea and vomiting after your treatment, we encourage you to take your nausea medication if needed   If you develop nausea and vomiting that is not controlled by your nausea medication, call the clinic.   BELOW ARE SYMPTOMS THAT SHOULD BE REPORTED IMMEDIATELY:  *FEVER GREATER THAN 100.5 F  *CHILLS WITH OR WITHOUT FEVER  NAUSEA AND VOMITING THAT IS NOT CONTROLLED WITH YOUR NAUSEA MEDICATION  *UNUSUAL SHORTNESS OF BREATH  *UNUSUAL BRUISING OR BLEEDING  TENDERNESS IN MOUTH AND THROAT WITH OR WITHOUT PRESENCE OF ULCERS  *URINARY PROBLEMS  *BOWEL PROBLEMS  UNUSUAL RASH Items with * indicate a potential emergency and should be followed up as soon as possible.  Feel free to call the clinic you have any questions or concerns. The clinic phone number is [phone].

## 2014-04-25 NOTE — Telephone Encounter (Signed)
per pof to add APP on 8/4-added --chgd lab & flush gave pt copy of updatd sch

## 2014-05-08 ENCOUNTER — Ambulatory Visit (HOSPITAL_COMMUNITY)
Admission: RE | Admit: 2014-05-08 | Discharge: 2014-05-08 | Disposition: A | Discharge status: Home / Self Care | Payer: Medicare Other | Source: Ambulatory Visit | Attending: Internal Medicine | Admitting: Internal Medicine

## 2014-05-08 DIAGNOSIS — T451X5A Adverse effect of antineoplastic and immunosuppressive drugs, initial encounter: Secondary | ICD-10-CM | POA: Insufficient documentation

## 2014-05-08 DIAGNOSIS — C50319 Malignant neoplasm of lower-inner quadrant of unspecified female breast: Secondary | ICD-10-CM | POA: Insufficient documentation

## 2014-05-08 DIAGNOSIS — F411 Generalized anxiety disorder: Secondary | ICD-10-CM | POA: Insufficient documentation

## 2014-05-08 DIAGNOSIS — E119 Type 2 diabetes mellitus without complications: Secondary | ICD-10-CM | POA: Insufficient documentation

## 2014-05-08 DIAGNOSIS — M2550 Pain in unspecified joint: Secondary | ICD-10-CM | POA: Diagnosis not present

## 2014-05-08 DIAGNOSIS — I10 Essential (primary) hypertension: Secondary | ICD-10-CM

## 2014-05-08 DIAGNOSIS — C50311 Malignant neoplasm of lower-inner quadrant of right female breast: Secondary | ICD-10-CM

## 2014-05-08 DIAGNOSIS — G589 Mononeuropathy, unspecified: Secondary | ICD-10-CM | POA: Diagnosis not present

## 2014-05-08 DIAGNOSIS — Z885 Allergy status to narcotic agent status: Secondary | ICD-10-CM | POA: Insufficient documentation

## 2014-05-08 DIAGNOSIS — C50919 Malignant neoplasm of unspecified site of unspecified female breast: Secondary | ICD-10-CM

## 2014-05-08 DIAGNOSIS — E876 Hypokalemia: Secondary | ICD-10-CM | POA: Insufficient documentation

## 2014-05-08 NOTE — Progress Notes (Signed)
Echo Lab  2D Echocardiogram completed.  [place], [place] 05/08/2014 2:31 PM

## 2014-05-11 ENCOUNTER — Ambulatory Visit (HOSPITAL_COMMUNITY)
Admission: RE | Admit: 2014-05-11 | Discharge: 2014-05-11 | Disposition: A | Discharge status: Home / Self Care | Payer: Medicare Other | Source: Ambulatory Visit | Attending: Internal Medicine | Admitting: Internal Medicine

## 2014-05-11 ENCOUNTER — Encounter (HOSPITAL_COMMUNITY): Payer: Self-pay

## 2014-05-11 ENCOUNTER — Other Ambulatory Visit: Payer: Self-pay | Admitting: Oncology

## 2014-05-11 VITALS — BP 122/88 | HR 115 | Wt 143.8 lb

## 2014-05-11 DIAGNOSIS — I429 Cardiomyopathy, unspecified: Secondary | ICD-10-CM | POA: Insufficient documentation

## 2014-05-11 DIAGNOSIS — R799 Abnormal finding of blood chemistry, unspecified: Secondary | ICD-10-CM

## 2014-05-11 DIAGNOSIS — I427 Cardiomyopathy due to drug and external agent: Secondary | ICD-10-CM

## 2014-05-11 DIAGNOSIS — T451X5A Adverse effect of antineoplastic and immunosuppressive drugs, initial encounter: Secondary | ICD-10-CM | POA: Insufficient documentation

## 2014-05-11 DIAGNOSIS — R000 Tachycardia, unspecified: Secondary | ICD-10-CM | POA: Insufficient documentation

## 2014-05-11 DIAGNOSIS — Z9010 Acquired absence of unspecified breast and nipple: Secondary | ICD-10-CM | POA: Diagnosis not present

## 2014-05-11 DIAGNOSIS — C50319 Malignant neoplasm of lower-inner quadrant of unspecified female breast: Secondary | ICD-10-CM

## 2014-05-11 DIAGNOSIS — C50919 Malignant neoplasm of unspecified site of unspecified female breast: Secondary | ICD-10-CM | POA: Insufficient documentation

## 2014-05-11 DIAGNOSIS — C50311 Malignant neoplasm of lower-inner quadrant of right female breast: Secondary | ICD-10-CM

## 2014-05-11 MED ORDER — CARVEDILOL 6.25 MG PO TABS: 6.2500 mg | ORAL_TABLET | Freq: Two times a day (BID) | ORAL | Status: DC

## 2014-05-11 NOTE — Patient Instructions (Addendum)
Follow up in 1 month with ECHO  Take carvedilol 6.25 mg twice a day

## 2014-05-11 NOTE — Progress Notes (Signed)
Patient ID: [name], female   DOB: [birth year], 63 y.o.   MRN: [id number] Primary oncologist: Dr. Jana Hakim  63 yo with history of breast cancer diagnosed in 12/14 s/p mastectomy presents to cardio-oncology clinic.  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  She has no significant cardiac history.  Her mother had CHF but we do not know the cause.    She was started on carboplatin, docetaxel, and Herceptin in 4/15 but only tolerated 2 cycles.   Now receiving Herceptin/Perjeta and Anastazole until she completes 6 cycles. This will be followed by Herceptin every 3 weeks for 1 year.  She returns for follow up. SHe has had a rough time with breast cancer therapy. Denies SOB/Orthopnea/CP.   ECHO 12/20/2013 EF 55-60%, lateral s' 11 cm/sec ECHO 05/08/14 EF ~ 50% Lateral S' 9.2 GLS 0 -15.9   PMH: 1. Breast cancer: Diagnosed in 12/14.  ER+/PR-/HER2neu+.  She is now s/p right mastectomy.  - Echo (3/15) with EF 55-60%, lateral s' 11 cm/sec (no strain) 2. Rheumatoid arthritis 3. Raynauds syndrome 4. OA 5. Degenerative disc disease: C-spine  SH: Lives with husband in [place], nonsmoker.   FH: Mother with CHF.   ROS: All systems reviewed and negative except as per HPI.   Current Outpatient Prescriptions  Medication Sig Dispense Refill  . anastrozole (ARIMIDEX) 1 MG tablet Take 1 tablet (1 mg total) by mouth daily.  30 tablet  5  . cyclobenzaprine (FLEXERIL) 10 MG tablet Take 10 mg by mouth 3 (three) times daily as needed for muscle spasms.      Marland Kitchen gabapentin (NEURONTIN) 300 MG capsule Take 1 capsule (300 mg total) by mouth at bedtime as needed. For neuropathy and hot flashes  30 capsule  5  . lidocaine-prilocaine (EMLA) cream Apply 1 application topically as needed. 1-2 hrs before port access  30 g  4  . traMADol (ULTRAM) 50 MG tablet Take 50 mg by mouth 3 (three) times daily as needed (pain).       . Vitamin D, Ergocalciferol, (DRISDOL) 50000 UNITS CAPS capsule Take 50,000 Units by mouth every 7  (seven) days. Wednesdays       No current facility-administered medications for this encounter.   Facility-Administered Medications Ordered in Other Encounters  Medication Dose Route Frequency Provider Last Rate Last Dose  . sodium chloride 0.9 % injection 10 mL  10 mL Intravenous PRN Amy G Berry, PA-C   10 mL at 03/01/14 1425  . sodium chloride 0.9 % injection 10 mL  10 mL Intravenous PRN Chauncey Cruel, MD   10 mL at 03/01/14 0934   BP 122/88  Pulse 115  Wt 143 lb 12.8 oz (65.227 kg)  SpO2 99% General: NAD. Husband present  Neck: No JVD, no thyromegaly or thyroid nodule.  Lungs: Clear to auscultation bilaterally with normal respiratory effort. CV: Nondisplaced PMI.  Heart regular S1/S2, no S3/S4, no murmur.  No peripheral edema.  No carotid bruit.  Normal pedal pulses.  Abdomen: Soft, nontender, no hepatosplenomegaly, no distention.  Skin: Intact without lesions or rashes.  Neurologic: Alert and oriented x 3.  Psych: Normal affect. Extremities: No clubbing or cyanosis.  HEENT: Normal.   ECG: NSR 110 No ST-T wave abnormalities.   Assessment/Plan: 1. R Breast Cancer- mT1 N0, stage IA invasive ductal carcinoma. Receivied only 2 cycles of carboplatin, docetaxel, trastuzumab and pertuzumab. Now receiving Herceptin/Perjeta and Anastazole until she completes 6 cycles. This will be followed by Herceptin every 3 weeks for 1 year.  Dr Haroldine Laws discussed and reviewed ECHO. EF and lateral S' down a little. Stop herceptin and start 6.25 mg.   Follow up in 1 month with an ECHO  CLEGG,AMY NP-C  05/11/2014  Patient seen and examined with Darrick Grinder, NP. We discussed all aspects of the encounter. I agree with the assessment and plan as stated above.   I reviewed echos personally. EF and Doppler parameters c/w with mild chemotherapy-induced CM. Will hold herceptin, Start carvedilol 6.25 bid. Repeat echo in 1 month. I called Dr. Jana Hakim to discuss.   Benay Spice 3:35 PM

## 2014-05-11 NOTE — Progress Notes (Unsigned)
Called by Dr. Haroldine Laws telling me that lie down as EF shows very early signs of dysfunction and therefore we are holding the Herceptin for 3 months. He will be following her closely and if it recovers we will consider resuming Herceptin. Other options are to go to lapatinib but I would prefer the former.

## 2014-05-16 ENCOUNTER — Ambulatory Visit: Payer: Medicare Other

## 2014-05-16 ENCOUNTER — Other Ambulatory Visit (HOSPITAL_BASED_OUTPATIENT_CLINIC_OR_DEPARTMENT_OTHER): Payer: Medicare Other

## 2014-05-16 ENCOUNTER — Ambulatory Visit (HOSPITAL_BASED_OUTPATIENT_CLINIC_OR_DEPARTMENT_OTHER): Payer: Medicare Other | Admitting: Adult Health

## 2014-05-16 ENCOUNTER — Encounter: Payer: Self-pay | Admitting: Adult Health

## 2014-05-16 ENCOUNTER — Telehealth: Payer: Self-pay | Admitting: Adult Health

## 2014-05-16 ENCOUNTER — Other Ambulatory Visit: Payer: Medicare Other

## 2014-05-16 ENCOUNTER — Telehealth: Payer: Self-pay | Admitting: *Deleted

## 2014-05-16 ENCOUNTER — Ambulatory Visit (HOSPITAL_BASED_OUTPATIENT_CLINIC_OR_DEPARTMENT_OTHER): Payer: Medicare Other

## 2014-05-16 VITALS — BP 125/90 | HR 87 | Temp 98.1°F | Resp 18 | Ht 60.0 in | Wt 148.5 lb

## 2014-05-16 DIAGNOSIS — Z452 Encounter for adjustment and management of vascular access device: Secondary | ICD-10-CM

## 2014-05-16 DIAGNOSIS — G620 Drug-induced polyneuropathy: Secondary | ICD-10-CM

## 2014-05-16 DIAGNOSIS — C50919 Malignant neoplasm of unspecified site of unspecified female breast: Secondary | ICD-10-CM

## 2014-05-16 DIAGNOSIS — Z95828 Presence of other vascular implants and grafts: Secondary | ICD-10-CM

## 2014-05-16 DIAGNOSIS — M2550 Pain in unspecified joint: Secondary | ICD-10-CM

## 2014-05-16 DIAGNOSIS — Z170 Estrogen receptor positive status [ER+]: Secondary | ICD-10-CM

## 2014-05-16 DIAGNOSIS — C50311 Malignant neoplasm of lower-inner quadrant of right female breast: Secondary | ICD-10-CM

## 2014-05-16 DIAGNOSIS — E876 Hypokalemia: Secondary | ICD-10-CM

## 2014-05-16 DIAGNOSIS — M069 Rheumatoid arthritis, unspecified: Secondary | ICD-10-CM

## 2014-05-16 LAB — COMPREHENSIVE METABOLIC PANEL (CC13)
ALT: 6 U/L (ref 0–55)
AST: 14 U/L (ref 5–34)
Albumin: 3.1 g/dL — ABNORMAL LOW (ref 3.5–5.0)
Alkaline Phosphatase: 97 U/L (ref 40–150)
Anion Gap: 9 mEq/L (ref 3–11)
BILIRUBIN TOTAL: 0.32 mg/dL (ref 0.20–1.20)
BUN: 7.8 mg/dL (ref 7.0–26.0)
CO2: 25 mEq/L (ref 22–29)
CREATININE: 0.9 mg/dL (ref 0.6–1.1)
Calcium: 9.2 mg/dL (ref 8.4–10.4)
Chloride: 106 mEq/L (ref 98–109)
Glucose: 140 mg/dl (ref 70–140)
Potassium: 3.3 mEq/L — ABNORMAL LOW (ref 3.5–5.1)
Sodium: 140 mEq/L (ref 136–145)
Total Protein: 7 g/dL (ref 6.4–8.3)

## 2014-05-16 LAB — CBC WITH DIFFERENTIAL/PLATELET
BASO%: 0.3 % (ref 0.0–2.0)
Basophils Absolute: 0 10*3/uL (ref 0.0–0.1)
EOS%: 1.3 % (ref 0.0–7.0)
Eosinophils Absolute: 0.1 10*3/uL (ref 0.0–0.5)
HEMATOCRIT: 35.7 % (ref 34.8–46.6)
HGB: 11.3 g/dL — ABNORMAL LOW (ref 11.6–15.9)
LYMPH%: 33.8 % (ref 14.0–49.7)
MCH: 25.3 pg (ref 25.1–34.0)
MCHC: 31.6 g/dL (ref 31.5–36.0)
MCV: 80.2 fL (ref 79.5–101.0)
MONO#: 0.5 10*3/uL (ref 0.1–0.9)
MONO%: 7.9 % (ref 0.0–14.0)
NEUT#: 3.6 10*3/uL (ref 1.5–6.5)
NEUT%: 56.7 % (ref 38.4–76.8)
PLATELETS: 180 10*3/uL (ref 145–400)
RBC: 4.45 10*6/uL (ref 3.70–5.45)
RDW: 14.7 % — ABNORMAL HIGH (ref 11.2–14.5)
WBC: 6.3 10*3/uL (ref 3.9–10.3)
lymph#: 2.1 10*3/uL (ref 0.9–3.3)

## 2014-05-16 LAB — MAGNESIUM (CC13): Magnesium: 1.9 mg/dl (ref 1.5–2.5)

## 2014-05-16 MED ORDER — HEPARIN SOD (PORK) LOCK FLUSH 100 UNIT/ML IV SOLN
500.0000 [IU] | Freq: Once | INTRAVENOUS | Status: AC
  Administered 2014-05-16: 500 [IU] via INTRAVENOUS
  Filled 2014-05-16: qty 5

## 2014-05-16 MED ORDER — SODIUM CHLORIDE 0.9 % IJ SOLN
10.0000 mL | INTRAMUSCULAR | Status: DC | PRN
  Administered 2014-05-16: 10 mL via INTRAVENOUS
  Filled 2014-05-16: qty 10

## 2014-05-16 MED ORDER — POTASSIUM CHLORIDE ER 10 MEQ PO TBCR: 10.0000 meq | EXTENDED_RELEASE_TABLET | Freq: Two times a day (BID) | ORAL | Status: DC

## 2014-05-16 NOTE — Progress Notes (Signed)
Angela Gross  Telephone:(336) [phone] Fax:(336) [phone]     ID: Angela Gross OB: 08/22/1951  MR#: [id number]  QJF#:[id number]  PCP: Salena Saner., MD GYN:   SUFanny Skates OTHER MD: [name], Lyndee Leo Sanger, Arnoldo Hooker Thimmappa  CHIEF COMPLAINT:  Right Breast Cancer/Adjuvant Therapy   HISTORY OF PRESENT ILLNESS: Angela Gross had routine screening mammography at the breast center 09/21/2013 showing a possible mass in calcifications in the right breast. On 10/11/2013 she underwent right diagnostic mammography and ultrasonography. This confirmed a spiculated 1.2 cm mass in the central right breast, with a group of pleomorphic calcifications slightly laterally. The calcifications spanning 1.6 cm. A second group of calcifications extended inferiorly and spanned 8 mm. The entire area in aggregate measured 6.5 cm. By physical exam there was no palpable abnormality. Ultrasonography showed an irregular hypoechoic mass at the 6:00 position of the right breast measuring 1 cm maximally. The right axilla was normal.  Biopsy of the right breast mass in question as well as the more lateral area of calcifications on 10/11/2013 showed ([id number]) most to be positive for an invasive ductal carcinoma, both estrogen receptor 85-90% positive with strong staining intensity, both progesterone receptor negative, with an MIB-1 of 17%. HER-2 was amplified, with a signals ratio of 3.44 and a copy number per cell 04 0.30.  Bilateral breast MRI 10/19/2013 showed again a spiculated mass in the 6:00 region of the right breast measuring 1.2 cm, a slightly more lateral hematoma associated with a second biopsy and also with clumped enhancement, and asymmetric none masslike enhancement in most of the right breast, the entire area of abnormality measuring 9.0 cm. The left breast was unremarkable and there were no abnormal appearing lymph nodes.  Her subsequent history is as detailed below    INTERVAL  HISTORY: Angela Gross returns today accompanied by her daughter for followup of her right breast cancer. She recently was evaluated in the cardio onc clinic by Dr. Haroldine Laws.  She did have a slight decrease in her LVEF and increased heart strain.  Trastuzumab and Pertuzumab were recommended to be held and she was started on Carvedilol.  F/u was recommended in 4 weeks with a repeat echocardiogram.  Angela Gross is very distressed about these results and the effect that the Trastuzumab and Pertuzumab have had on her heart.  She is very concerned about restarting this therapy.  She said that Dr. Haroldine Laws talked to her about Lapatinib and starting that possibly too.  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  Angela Gross is otherwise doing well and she denies fevers, chills, nausea, vomiting, constipation, diarrhea, or any further concerns.    REVIEW OF SYSTEMS: A 10 point review of systems was conducted and is otherwise negative except for what is noted above.       PAST MEDICAL HISTORY: Past Medical History  Diagnosis Date  . Raynaud disease   . Osteoporosis   . Hypertension   . PONV (postoperative nausea and vomiting)   . Wears glasses   . Full dentures   . Fibromyalgia   . Rheumatoid arthritis     "elbows; fingers; toes"  . Osteoarthritis of both knees   . Degenerative disc disease, cervical   . Chronic left shoulder pain     "work related injury" (01/10/2014)  . Breast cancer     "right" (01/10/2014)  . Type II diabetes mellitus     has not taken meds in over a month (01/10/2014)  . Syncope  and collapse     "4 times in the last year" (01/10/2014)    PAST SURGICAL HISTORY: Past Surgical History  Procedure Laterality Date  . Colonoscopy    . Mastectomy w/ sentinel node biopsy Right 12/13/2013    Procedure: RIGHT TOTAL MASTECTOMY WITH SENTINEL LYMPH NODE BIOPSY;  Surgeon: Adin Hector, MD;  Location: [facility];  Service: General;  Laterality:  Right;  . Portacath placement Right 12/13/2013    Procedure: INSERTION PORT-A-CATH;  Surgeon: Adin Hector, MD;  Location: [facility];  Service: General;  Laterality: Right;  . Breast reconstruction with placement of tissue expander and flex hd (acellular hydrated dermis) Right 12/13/2013    Procedure: RIGHT BREAST RECONSTRUCTION WITH PLACEMENT OF TISSUE EXPANDER AND FLEX HD (ACELLULAR HYDRATED DERMIS);  Surgeon: Irene Limbo, MD;  Location: [facility];  Service: Plastics;  Laterality: Right;  . Lipoma excision Bilateral 12/13/2013    Procedure: EXCISION OF LEFT BREAST KELOID AND RIGHT CHEST KELOID;  Surgeon: Irene Limbo, MD;  Location: [facility];  Service: Plastics;  Laterality: Bilateral;  . Mastectomy    . Cholecystectomy  1970's  . Vaginal hysterectomy  1980    no salpingo-oophoretomu  . Breast biopsy Right 10/2013  . Tissue expander placement Right 01/10/2014    Procedure: Incision and Drainage Right Breast Seroma with TISSUE EXPANDER exchange;  Surgeon: Irene Limbo, MD;  Location: [facility];  Service: Plastics;  Laterality: Right;    FAMILY HISTORY No family history on file. The patient is little information about her father. Her mother died from a heart attack at the age of 87. She had been diagnosed with breast cancer in her late 69s. The patient had 4 brothers and 4 sisters. There is no other history of breast or ovarian cancer in the family to her knowledge.  GYNECOLOGIC HISTORY:    (Reviewed 04/04/2014) She does not recall her age of menarche. She underwent hysterectomy in her 60s. She did not receive hormone replacement. First live birth at 55. She was GX P2.  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 She is currently disabled secondary to her rheumatoid arthritis. Her husband Luellen Howson is a retired Administrator. He now works part time at [employer].  Son [name] works in [place] as a Patent attorney. Daughter [name] is a Social worker.    ADVANCED DIRECTIVES: Not in place   HEALTH MAINTENANCE: (Updated 04/04/2014) History  Substance Use Topics  . Smoking status: Never Smoker   . Smokeless tobacco: Never Used  . Alcohol Use: No     Colonoscopy: Not on file  PAP: Status post remote hysterectomy  Bone density: At [facility] 01/05/2009 showed osteopenia with a T score of -1.7  Lipid panel: Not on file   Allergies  Allergen Reactions  . Codeine Other (See Comments) and Hives    Altered mental status, numbness  Altered mental status, Numbess  . Diazepam     Other reaction(s): Delusions (intolerance) hallucinations     Current Outpatient Prescriptions  Medication Sig Dispense Refill  . carvedilol (COREG) 6.25 MG tablet Take 1 tablet (6.25 mg total) by mouth 2 (two) times daily with a meal.  60 tablet  6  . cyclobenzaprine (FLEXERIL) 10 MG tablet Take 10 mg by mouth 3 (three) times daily as needed for muscle spasms.      Marland Kitchen gabapentin (NEURONTIN)  300 MG capsule Take 1 capsule (300 mg total) by mouth at bedtime as needed. For neuropathy and hot flashes  30 capsule  5  . lidocaine-prilocaine (EMLA) cream Apply 1 application topically as needed. 1-2 hrs before port access  30 g  4  . traMADol (ULTRAM) 50 MG tablet Take 50 mg by mouth 3 (three) times daily as needed (pain).       . Vitamin D, Ergocalciferol, (DRISDOL) 50000 UNITS CAPS capsule Take 50,000 Units by mouth every 7 (seven) days. Wednesdays      . anastrozole (ARIMIDEX) 1 MG tablet Take 1 tablet (1 mg total) by mouth daily.  30 tablet  5  . potassium chloride (K-DUR) 10 MEQ tablet Take 1 tablet (10 mEq total) by mouth 2 (two) times daily.  10 tablet  0   No current facility-administered medications for this visit.   Facility-Administered Medications Ordered in Other Visits  Medication Dose Route Frequency Provider Last  Rate Last Dose  . sodium chloride 0.9 % injection 10 mL  10 mL Intravenous PRN Amy G Berry, PA-C   10 mL at 03/01/14 1425  . sodium chloride 0.9 % injection 10 mL  10 mL Intravenous PRN Chauncey Cruel, MD   10 mL at 03/01/14 0934    OBJECTIVE:   Filed Vitals:   05/16/14 0850  BP: 125/90  Pulse: 87  Temp: 98.1 F (36.7 C)  Resp: 18     Body mass index is 29 kg/(m^2).    ECOG FS: 1 Middle-aged African American woman in no acute distress Filed Weights   05/16/14 0850  Weight: 148 lb 8 oz (67.359 kg)   Physical Exam: HEENT:  Sclerae anicteric.  Oropharynx is pink, moist, and clear. No ulcerations noted. No evidence of mucositis or oropharyngeal candidiasis. Neck supple, trachea midline. NODES:  No cervical or supraclavicular lymphadenopathy palpated.  BREAST EXAM: Breast exam was deferred. Axillae are benign bilaterally, with no palpable lymphadenopathy. LUNGS:  Clear to auscultation bilaterally with good excursion.  No crackles, wheezes, or rhonchi HEART:  Regular rate and rhythm. No murmur appreciated. ABDOMEN:  Soft, nontender. No organomegaly or masses palpated. Positive bowel sounds.  MSK:  No focal spinal tenderness to palpation. Good range of motion bilaterally in the upper extremities. EXTREMITIES:  1+ pitting edema bilaterally in the lower extremities, equal bilaterally. No erythema, warmth, or palpable cords.  No lymphedema in the right upper extremity. SKIN:  Benign with no visible rashes and only mild hyperpigmentation on the hands and feet. The nail beds are also hyperpigmented on the hands, but no additional nail dyscrasia is noted.  No excessive ecchymoses. No petechiae. No pallor. Skin is warm and dry. NEURO:  Nonfocal. Well oriented.  Appropriate affect.    LAB RESULTS:   Lab Results  Component Value Date   WBC 6.3 05/16/2014   NEUTROABS 3.6 05/16/2014   HGB 11.3* 05/16/2014   HCT 35.7 05/16/2014   MCV 80.2 05/16/2014   PLT 180 05/16/2014      Chemistry       Component Value Date/Time   NA 140 05/16/2014 0834   NA 138 01/09/2014 1522   K 3.3* 05/16/2014 0834   K 4.2 01/09/2014 1522   CL 100 01/09/2014 1522   CO2 25 05/16/2014 0834   CO2 25 01/09/2014 1522   BUN 7.8 05/16/2014 0834   BUN 11 01/09/2014 1522   CREATININE 0.9 05/16/2014 0834   CREATININE 0.99 01/09/2014 1522      Component Value Date/Time  CALCIUM 9.2 05/16/2014 0834   CALCIUM 8.5 01/09/2014 1522   ALKPHOS 97 05/16/2014 0834   ALKPHOS 102 12/09/2013 1400   AST 14 05/16/2014 0834   AST 36 12/09/2013 1400   ALT 6 05/16/2014 0834   ALT 30 12/09/2013 1400   BILITOT 0.32 05/16/2014 0834   BILITOT 0.2* 12/09/2013 1400       STUDIES:  Echocardiogram on 12/20/2013 showed an ejection fraction of 55-60%.  This is due to be repeated in early August, as first dose of trastuzumab and pertuzumab was not given until 02/21/2014.  DG CHEST PORT 1 VIEW 03/03/215   CLINICAL DATA: Port-A-Cath placement.  EXAM:  PORTABLE CHEST - 1 VIEW  COMPARISON: 12/09/2013  FINDINGS:  There has been placement of a right subclavian Port-A-Cath which has  tip at the cavoatrial junction. No definite right pneumothorax.  There is a small caliber catheter projected over the right chest  with tip just right of midline at the level of the hilum which may  be related to patient's recent right breast surgery. Lungs are  somewhat hypoinflated with minimal prominence of the perihilar  markings suggesting minimal vascular congestion. There is subtle  opacification over the lateral left base likely atelectasis.  Cardiomediastinal silhouette is within normal. There is a small  right cervical rib. There are surgical clips over the right axilla.  Remainder of the exam is unchanged.  IMPRESSION:  Minimal prominence of the perihilar markings suggesting minimal  vascular congestion. Possible minimal left basilar atelectasis.  Right subclavian Port-A-Cath with tip at the region of the  cavoatrial junction. No definite pneumothorax.   Small caliber catheter over the right chest with tip just right of  midline as this may represent a surgical drain related to patient's  recent breast surgery.  Electronically Signed  By: Marin Olp M.D.  On: 12/13/2013 13:27    ASSESSMENT: 63 y.o. [place] woman   (1)  s/p biopsy of separate Right breast masses 10/11/2013 for a clinical mT1 N0, stage IA invasive ductal carcinoma, grade II-III, one of the mass is being 85% estrogen receptor positive, progesterone receptor negative, with an MIB-1 of 17% and HER-2 amplified.  (2) status post right mastectomy and sentinel lymph node sampling with immediate expander placement 12/13/2013 for a pT1c pN0 invasive ductal carcinoma, grade 3, again HER-2 positive.  (3) Being treated in the adjuvant setting with carboplatin, docetaxel, trastuzumab and pertuzumab, first dose on 02/21/2014. Neulasta on day 2 for granulocyte support. The plan is to repeat these agents every 21 days x6, after which the anti-HER-2 treatment (both trastuzumab and pertuzumab) will be continued to the total one year. Patient developed significant peripheral neuropathy after one cycle of chemotherapy, and both carboplatin and docetaxel were held beginning with cycle 2.  (4)  For cycles 2 through 6, patient receiving only q. three-week trastuzumab/pertuzumab.  After a total of 6 cycles of trastuzumab/pertuzumab the pertuzumab will be discontinued, and single agent trastuzumab will be continued every 3 weeks for total of one year (until May 2016)  (5)  The patient started on anastrozole on 04/04/2014 after discontinuing chemotherapy with docetaxel/carboplatin. The plan is to continue for total of 5 years, until June 2020.   (6) The patient will not need postmastectomy radiation  (7) Rheumatoid arthritis, usually on Remicade, prednisone and methotrexate, held prior to mastectomy.  (8)  Hx of cellulitis affecting the right reconstructed breast diagnosed at 01/09/2014 with  subsequent surgery to drain the seroma and exchange tissue expander - Resolved  (9)  chemotherapy-induced peripheral neuropathy  - to be treated with gabapentin, 300 mg at bedtime, and tramadol, 50 mg every 6 hours as needed.     PLAN:  Angela Gross is doing moderately well today.  Her lab work is stable and I reviewed it with her in detail.  She is very concerned about the decrease in her LVEF and Dr. Haroldine Laws.  We discussed this in detail.  She will not receive Trastuzumab today.  We talked about Lapatinib and possible adverse effects.  I recommended she add super b complex daily for her neuropathy.    Angela Gross will return on 06/27/14 for labs and discussion of restarting Trastuzumab versus Lapatinib.  I spent 25 minutes counseling the patient face to face.  The total time spent in the appointment was 30 minutes.  Angela Gross, [facility] [phone]           05/17/2014 9:53 PM

## 2014-05-16 NOTE — Patient Instructions (Addendum)
Take Super B complex daily  Hypokalemia Hypokalemia means that the amount of potassium in the blood is lower than normal.Potassium is a chemical, called an electrolyte, that helps regulate the amount of fluid in the body. It also stimulates muscle contraction and helps nerves function properly.Most of the body's potassium is inside of cells, and only a very small amount is in the blood. Because the amount in the blood is so small, minor changes can be life-threatening. CAUSES  Antibiotics.  Diarrhea or vomiting.  Using laxatives too much, which can cause diarrhea.  Chronic kidney disease.  Water pills (diuretics).  Eating disorders (bulimia).  Low magnesium level.  Sweating a lot. SIGNS AND SYMPTOMS  Weakness.  Constipation.  Fatigue.  Muscle cramps.  Mental confusion.  Skipped heartbeats or irregular heartbeat (palpitations).  Tingling or numbness. DIAGNOSIS  Your health care provider can diagnose hypokalemia with blood tests. In addition to checking your potassium level, your health care provider may also check other lab tests. TREATMENT Hypokalemia can be treated with potassium supplements taken by mouth or adjustments in your current medicines. If your potassium level is very low, you may need to get potassium through a vein (IV) and be monitored in the hospital. A diet high in potassium is also helpful. Foods high in potassium are:  Nuts, such as peanuts and pistachios.  Seeds, such as sunflower seeds and pumpkin seeds.  Peas, lentils, and lima beans.  Whole grain and bran cereals and breads.  Fresh fruit and vegetables, such as apricots, avocado, bananas, cantaloupe, kiwi, oranges, tomatoes, asparagus, and potatoes.  Orange and tomato juices.  Red meats.  Fruit yogurt. HOME CARE INSTRUCTIONS  Take all medicines as prescribed by your health care provider.  Maintain a healthy diet by including nutritious food, such as fruits, vegetables, nuts, whole  grains, and lean meats.  If you are taking a laxative, be sure to follow the directions on the label. SEEK MEDICAL CARE IF:  Your weakness gets worse.  You feel your heart pounding or racing.  You are vomiting or having diarrhea.  You are diabetic and having trouble keeping your blood glucose in the normal range. SEEK IMMEDIATE MEDICAL CARE IF:  You have chest pain, shortness of breath, or dizziness.  You are vomiting or having diarrhea for more than 2 days.  You faint. MAKE SURE YOU:   Understand these instructions.  Will watch your condition.  Will get help right away if you are not doing well or get worse. Document Released: 09/29/2005 Document Revised: 07/20/2013 Document Reviewed: 04/01/2013 [facility] Patient Information 2015 [place], [place]. This information is not intended to replace advice given to you by your health care provider. Make sure you discuss any questions you have with your health care provider.

## 2014-05-16 NOTE — Telephone Encounter (Signed)
per pof to sch pt appt-gave pt copy of sch-LaKeisha sch pt trmt

## 2014-05-16 NOTE — Patient Instructions (Signed)

## 2014-05-16 NOTE — Telephone Encounter (Signed)
Per [facility] staff phone call scheduled appts. Advised schedulers 

## 2014-06-08 ENCOUNTER — Ambulatory Visit (HOSPITAL_BASED_OUTPATIENT_CLINIC_OR_DEPARTMENT_OTHER)
Admission: RE | Admit: 2014-06-08 | Discharge: 2014-06-08 | Disposition: A | Discharge status: Home / Self Care | Payer: Medicare Other | Source: Ambulatory Visit | Attending: Cardiology | Admitting: Cardiology

## 2014-06-08 ENCOUNTER — Ambulatory Visit (HOSPITAL_COMMUNITY)
Admission: RE | Admit: 2014-06-08 | Discharge: 2014-06-08 | Disposition: A | Discharge status: Home / Self Care | Payer: Medicare Other | Source: Ambulatory Visit | Attending: Cardiology | Admitting: Cardiology

## 2014-06-08 ENCOUNTER — Encounter (HOSPITAL_COMMUNITY): Payer: Self-pay

## 2014-06-08 VITALS — BP 132/90 | HR 91 | Wt 146.8 lb

## 2014-06-08 DIAGNOSIS — E119 Type 2 diabetes mellitus without complications: Secondary | ICD-10-CM | POA: Diagnosis not present

## 2014-06-08 DIAGNOSIS — Z9010 Acquired absence of unspecified breast and nipple: Secondary | ICD-10-CM | POA: Diagnosis not present

## 2014-06-08 DIAGNOSIS — C50319 Malignant neoplasm of lower-inner quadrant of unspecified female breast: Secondary | ICD-10-CM

## 2014-06-08 DIAGNOSIS — T451X5A Adverse effect of antineoplastic and immunosuppressive drugs, initial encounter: Secondary | ICD-10-CM

## 2014-06-08 DIAGNOSIS — R799 Abnormal finding of blood chemistry, unspecified: Secondary | ICD-10-CM | POA: Diagnosis not present

## 2014-06-08 DIAGNOSIS — C50311 Malignant neoplasm of lower-inner quadrant of right female breast: Secondary | ICD-10-CM

## 2014-06-08 DIAGNOSIS — I429 Cardiomyopathy, unspecified: Secondary | ICD-10-CM

## 2014-06-08 DIAGNOSIS — I427 Cardiomyopathy due to drug and external agent: Secondary | ICD-10-CM

## 2014-06-08 DIAGNOSIS — Z170 Estrogen receptor positive status [ER+]: Secondary | ICD-10-CM | POA: Diagnosis not present

## 2014-06-08 MED ORDER — LISINOPRIL 10 MG PO TABS: 10.0000 mg | ORAL_TABLET | Freq: Every day | ORAL | Status: DC

## 2014-06-08 NOTE — Progress Notes (Signed)
  Echocardiogram 2D Echocardiogram has been performed.  Darlina Sicilian M 06/08/2014, 10:43 AM

## 2014-06-08 NOTE — Progress Notes (Signed)
Patient ID: Angela Gross, female   DOB: [birth year], 63 y.o.   MRN: [id number] Primary oncologist: Dr. Jana Hakim  63 yo with history of breast cancer diagnosed in 12/14 s/p mastectomy presents to cardio-oncology clinic.  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  She has no significant cardiac history.  Her mother had CHF but we do not know the cause.    She was started on carboplatin, docetaxel, and Herceptin in 4/15 but only tolerated 2 cycles.  Then switched to Herceptin/Perjeta and Anastazole. Had 4 cycles and then Herceptin/Perjeta stopped due to cardiotoxicity.   ECHO 12/20/2013 EF 55-60%, lateral s' 11 cm/sec ECHO 05/08/14 EF ~ 50% Lateral S' 9.2 GLS -15.9  ECHO 06/08/14 EF 55% Lateral s' 7.2 cm/sec GLS - 17.3   She returns for follow up. At last visit herceptin stopped due to dropping EF and lateral s' on echo. Carvedilol started. Feels run down. Good days. Bad days. Occasional SOB. Very occasional edema. Tolerated carvedilol but makes her tired.    PMH: 1. Breast cancer: Diagnosed in 12/14.  ER+/PR-/HER2neu+.  She is now s/p right mastectomy.  - Echo (3/15) with EF 55-60%, lateral s' 11 cm/sec (no strain) 2. Rheumatoid arthritis 3. Raynauds syndrome 4. OA 5. Degenerative disc disease: C-spine  SH: Lives with husband in [place], nonsmoker.   FH: Mother with CHF.   ROS: All systems reviewed and negative except as per HPI.   Current Outpatient Prescriptions  Medication Sig Dispense Refill  . anastrozole (ARIMIDEX) 1 MG tablet Take 1 tablet (1 mg total) by mouth daily.  30 tablet  5  . carvedilol (COREG) 6.25 MG tablet Take 1 tablet (6.25 mg total) by mouth 2 (two) times daily with a meal.  60 tablet  6  . cyclobenzaprine (FLEXERIL) 10 MG tablet Take 10 mg by mouth 3 (three) times daily as needed for muscle spasms.      Marland Kitchen gabapentin (NEURONTIN) 300 MG capsule Take 1 capsule (300 mg total) by mouth at bedtime as needed. For neuropathy and hot flashes  30 capsule  5  .  lidocaine-prilocaine (EMLA) cream Apply 1 application topically as needed. 1-2 hrs before port access  30 g  4  . potassium chloride (K-DUR) 10 MEQ tablet Take 1 tablet (10 mEq total) by mouth 2 (two) times daily.  10 tablet  0  . traMADol (ULTRAM) 50 MG tablet Take 50 mg by mouth 3 (three) times daily as needed (pain).       . Vitamin D, Ergocalciferol, (DRISDOL) 50000 UNITS CAPS capsule Take 50,000 Units by mouth every 7 (seven) days. Wednesdays       No current facility-administered medications for this encounter.   Facility-Administered Medications Ordered in Other Encounters  Medication Dose Route Frequency Provider Last Rate Last Dose  . sodium chloride 0.9 % injection 10 mL  10 mL Intravenous PRN Amy G Berry, PA-C   10 mL at 03/01/14 1425  . sodium chloride 0.9 % injection 10 mL  10 mL Intravenous PRN Chauncey Cruel, MD   10 mL at 03/01/14 0934   BP 132/90  Pulse 91  Wt 146 lb 12.8 oz (66.588 kg)  SpO2 99% General: NAD. daughter present  Neck: No JVD, no thyromegaly or thyroid nodule.  Lungs: Clear to auscultation bilaterally with normal respiratory effort. CV: Nondisplaced PMI.  Heart regular S1/S2, no S3/S4, no murmur.  No peripheral edema.  No carotid bruit.  Normal pedal pulses.  Abdomen: Soft, nontender, no hepatosplenomegaly, no distention.  Skin:  Intact without lesions or rashes.  Neurologic: Alert and oriented x 3.  Psych: Normal affect. Extremities: No clubbing or cyanosis.  HEENT: Normal.     Assessment/Plan: 1. R Breast Cancer- mT1 N0, stage IA invasive ductal carcinoma.  --Herceptin stopped 7/15 due to drop in EF 2. Chemotherapy-induced cardiomyopathy --I have reviewed all echos personally. Her cardiac function is improving off Herceptin but lateral s' still slightly down. Will add lisinopril 10 and repeat echo in 1 month. We had long talk about how to proceed in future. If cardiac function returns to normal she is ok with retrying Herceptin with the realization  that about 50% of patients will have recurrent cardiotoxicity despite cardioprotective agents. If she has recurrent cardiotoxicity with herceptin. Lapatanib may be an option.   Total time spent 40 minutes. Over half that time spent discussing above.    Desi Rowe,MD 11:46 AM

## 2014-06-08 NOTE — Patient Instructions (Addendum)
Start lisinopril 10mg  tablet once daily.  Return to clinic in 1 month with repeat ECHO and clinic appointment with the doctor.

## 2014-06-22 ENCOUNTER — Ambulatory Visit (INDEPENDENT_AMBULATORY_CARE_PROVIDER_SITE_OTHER): Payer: Medicare Other | Admitting: General Surgery

## 2014-06-27 ENCOUNTER — Telehealth: Payer: Self-pay | Admitting: Nurse Practitioner

## 2014-06-27 ENCOUNTER — Encounter: Payer: Self-pay | Admitting: Nurse Practitioner

## 2014-06-27 ENCOUNTER — Ambulatory Visit (HOSPITAL_BASED_OUTPATIENT_CLINIC_OR_DEPARTMENT_OTHER): Payer: Medicare Other | Admitting: Nurse Practitioner

## 2014-06-27 ENCOUNTER — Telehealth: Payer: Self-pay | Admitting: *Deleted

## 2014-06-27 ENCOUNTER — Other Ambulatory Visit (HOSPITAL_BASED_OUTPATIENT_CLINIC_OR_DEPARTMENT_OTHER): Payer: Medicare Other

## 2014-06-27 ENCOUNTER — Ambulatory Visit: Payer: Medicare Other

## 2014-06-27 ENCOUNTER — Ambulatory Visit (HOSPITAL_BASED_OUTPATIENT_CLINIC_OR_DEPARTMENT_OTHER): Payer: Medicare Other

## 2014-06-27 VITALS — BP 141/88 | HR 89 | Temp 98.2°F | Resp 18 | Ht 60.0 in | Wt 151.0 lb

## 2014-06-27 DIAGNOSIS — T451X5A Adverse effect of antineoplastic and immunosuppressive drugs, initial encounter: Principal | ICD-10-CM

## 2014-06-27 DIAGNOSIS — Z95828 Presence of other vascular implants and grafts: Secondary | ICD-10-CM

## 2014-06-27 DIAGNOSIS — M069 Rheumatoid arthritis, unspecified: Secondary | ICD-10-CM | POA: Insufficient documentation

## 2014-06-27 DIAGNOSIS — N951 Menopausal and female climacteric states: Secondary | ICD-10-CM

## 2014-06-27 DIAGNOSIS — C50311 Malignant neoplasm of lower-inner quadrant of right female breast: Secondary | ICD-10-CM

## 2014-06-27 DIAGNOSIS — E876 Hypokalemia: Secondary | ICD-10-CM

## 2014-06-27 DIAGNOSIS — Z170 Estrogen receptor positive status [ER+]: Secondary | ICD-10-CM

## 2014-06-27 DIAGNOSIS — C50919 Malignant neoplasm of unspecified site of unspecified female breast: Secondary | ICD-10-CM

## 2014-06-27 DIAGNOSIS — G620 Drug-induced polyneuropathy: Secondary | ICD-10-CM

## 2014-06-27 DIAGNOSIS — M2550 Pain in unspecified joint: Secondary | ICD-10-CM | POA: Insufficient documentation

## 2014-06-27 DIAGNOSIS — Z452 Encounter for adjustment and management of vascular access device: Secondary | ICD-10-CM

## 2014-06-27 DIAGNOSIS — I427 Cardiomyopathy due to drug and external agent: Secondary | ICD-10-CM

## 2014-06-27 LAB — MAGNESIUM (CC13): Magnesium: 2 mg/dl (ref 1.5–2.5)

## 2014-06-27 LAB — CBC WITH DIFFERENTIAL/PLATELET
BASO%: 0.4 % (ref 0.0–2.0)
BASOS ABS: 0 10*3/uL (ref 0.0–0.1)
EOS%: 1.3 % (ref 0.0–7.0)
Eosinophils Absolute: 0.1 10*3/uL (ref 0.0–0.5)
HCT: 36.6 % (ref 34.8–46.6)
HGB: 11.6 g/dL (ref 11.6–15.9)
LYMPH#: 2.4 10*3/uL (ref 0.9–3.3)
LYMPH%: 29.1 % (ref 14.0–49.7)
MCH: 25.6 pg (ref 25.1–34.0)
MCHC: 31.8 g/dL (ref 31.5–36.0)
MCV: 80.6 fL (ref 79.5–101.0)
MONO#: 0.5 10*3/uL (ref 0.1–0.9)
MONO%: 6.1 % (ref 0.0–14.0)
NEUT%: 63.1 % (ref 38.4–76.8)
NEUTROS ABS: 5.3 10*3/uL (ref 1.5–6.5)
Platelets: 175 10*3/uL (ref 145–400)
RBC: 4.54 10*6/uL (ref 3.70–5.45)
RDW: 15.1 % — AB (ref 11.2–14.5)
WBC: 8.4 10*3/uL (ref 3.9–10.3)

## 2014-06-27 LAB — COMPREHENSIVE METABOLIC PANEL (CC13)
ALBUMIN: 3.2 g/dL — AB (ref 3.5–5.0)
ALK PHOS: 101 U/L (ref 40–150)
ALT: 7 U/L (ref 0–55)
AST: 11 U/L (ref 5–34)
Anion Gap: 10 mEq/L (ref 3–11)
BUN: 9.1 mg/dL (ref 7.0–26.0)
CO2: 23 mEq/L (ref 22–29)
Calcium: 9 mg/dL (ref 8.4–10.4)
Chloride: 109 mEq/L (ref 98–109)
Creatinine: 1.1 mg/dL (ref 0.6–1.1)
Glucose: 145 mg/dl — ABNORMAL HIGH (ref 70–140)
POTASSIUM: 3.5 meq/L (ref 3.5–5.1)
SODIUM: 141 meq/L (ref 136–145)
TOTAL PROTEIN: 7.2 g/dL (ref 6.4–8.3)
Total Bilirubin: 0.22 mg/dL (ref 0.20–1.20)

## 2014-06-27 MED ORDER — CYCLOBENZAPRINE HCL 10 MG PO TABS: 10.0000 mg | ORAL_TABLET | Freq: Three times a day (TID) | ORAL | Status: DC | PRN

## 2014-06-27 MED ORDER — SODIUM CHLORIDE 0.9 % IJ SOLN
10.0000 mL | INTRAMUSCULAR | Status: DC | PRN
  Administered 2014-06-27: 10 mL via INTRAVENOUS
  Filled 2014-06-27: qty 10

## 2014-06-27 MED ORDER — TRAMADOL HCL 50 MG PO TABS: 50.0000 mg | ORAL_TABLET | Freq: Three times a day (TID) | ORAL | Status: DC | PRN

## 2014-06-27 NOTE — Patient Instructions (Signed)

## 2014-06-27 NOTE — Progress Notes (Signed)
[place]  [phone] [phone]     ID: [name] OB: January 23, 1958  MR#: [id number]  XAJ#:[id number]  PCP: Salena Saner., MD GYN:   SUFanny Skates OTHER MD: [name], Claire Sanger, Arnoldo Hooker Thimmappa  CHIEF COMPLAINT:  Right Breast Cancer  CURRENT TREATMENT:  Trastuzumab/pertuzumab q 3 weeks, anastrozole daily  HISTORY OF PRESENT ILLNESS: Gabriellia had routine screening mammography at the breast center 09/21/2013 showing a possible mass in calcifications in the right breast. On 10/11/2013 she underwent right diagnostic mammography and ultrasonography. This confirmed a spiculated 1.2 cm mass in the central right breast, with a group of pleomorphic calcifications slightly laterally. The calcifications spanning 1.6 cm. A second group of calcifications extended inferiorly and spanned 8 mm. The entire area in aggregate measured 6.5 cm. By physical exam there was no palpable abnormality. Ultrasonography showed an irregular hypoechoic mass at the 6:00 position of the right breast measuring 1 cm maximally. The right axilla was normal.  Biopsy of the right breast mass in question as well as the more lateral area of calcifications on 10/11/2013 showed ([id number]) most to be positive for an invasive ductal carcinoma, both estrogen receptor 85-90% positive with strong staining intensity, both progesterone receptor negative, with an MIB-1 of 17%. HER-2 was amplified, with a signals ratio of 3.44 and a copy number per cell 04 0.30.  Bilateral breast MRI 10/19/2013 showed again a spiculated mass in the 6:00 region of the right breast measuring 1.2 cm, a slightly more lateral hematoma associated with a second biopsy and also with clumped enhancement, and asymmetric none masslike enhancement in most of the right breast, the entire area of abnormality measuring 9.0 cm. The left breast was unremarkable and there were no abnormal appearing lymph nodes.  Her  subsequent history is as detailed below   INTERVAL HISTORY: Nika returns for follow up of her breast cancer. She continues anastrozole daily. Today is day 1, cycle 5 of trastuzumab and pertuzumab. This dose was held on 05/16/14 due to decreasing LVEF function. Tykeisha was recently seen on 8/27 by Dr. Haroldine Laws and had a repeat echo performed. The LVEF is improving, but is not yet back to baseline. She has been started on carvedilol as well as lisinopril by Dr. Haroldine Laws as cardioprotective agents.   REVIEW OF SYSTEMS:  Dierdre denies fevers, chills, nausea, vomiting, mouth sores, or rash. She has occasional constipation, but this is relieved with miralax or changes in diet. She states her arthritis has been worse on anastrozole, but the tramadol she had been prescribed is very helpful. She continues to take gabapentin for her neuropathy, and this has also benefited her hot flashes. She has occasional shortness of breath with exertion, but she denies chest pain, palpitations, or cough. Fatigue is an issue some days. A detailed review of systems was otherwise noncontributory.   PAST MEDICAL HISTORY: Past Medical History  Diagnosis Date  . Raynaud disease   . Osteoporosis   . Hypertension   . PONV (postoperative nausea and vomiting)   . Wears glasses   . Full dentures   . Fibromyalgia   . Rheumatoid arthritis     "elbows; fingers; toes"  . Osteoarthritis of both knees   . Degenerative disc disease, cervical   . Chronic left shoulder pain     "work related injury" (01/10/2014)  . Breast cancer     "right" (01/10/2014)  . Type II diabetes mellitus     has not taken meds in over a  month (01/10/2014)  . Syncope and collapse     "4 times in the last year" (01/10/2014)    PAST SURGICAL HISTORY: Past Surgical History  Procedure Laterality Date  . Colonoscopy    . Mastectomy w/ sentinel node biopsy Right 12/13/2013    Procedure: RIGHT TOTAL MASTECTOMY WITH SENTINEL LYMPH NODE BIOPSY; 63  Surgeon:  Adin Hector, MD;  Location: [facility];  Service: General;  Laterality: Right;  . Portacath placement Right 12/13/2013    Procedure: INSERTION PORT-A-CATH;  Surgeon: Adin Hector, MD;  Location: [facility];  Service: General;  Laterality: Right;  . Breast reconstruction with placement of tissue expander and flex hd (acellular hydrated dermis) Right 12/13/2013    Procedure: RIGHT BREAST RECONSTRUCTION WITH PLACEMENT OF TISSUE EXPANDER AND FLEX HD (ACELLULAR HYDRATED DERMIS);  Surgeon: Irene Limbo, MD;  Location: [facility];  Service: Plastics;  Laterality: Right;  . Lipoma excision Bilateral 12/13/2013    Procedure: EXCISION OF LEFT BREAST KELOID AND RIGHT CHEST KELOID;  Surgeon: Irene Limbo, MD;  Location: [facility];  Service: Plastics;  Laterality: Bilateral;  . Mastectomy    . Cholecystectomy  1970's  . Vaginal hysterectomy  1980    no salpingo-oophoretomu  . Breast biopsy Right 10/2013  . Tissue expander placement Right 01/10/2014    Procedure: Incision and Drainage Right Breast Seroma with TISSUE EXPANDER exchange;  Surgeon: Irene Limbo, MD;  Location: [facility];  Service: Plastics;  Laterality: Right;    FAMILY HISTORY History reviewed. No pertinent family history. The patient is little information about her father. Her mother died from a heart attack at the age of 37. She had been diagnosed with breast cancer in her late 25s. The patient had 4 brothers and 4 sisters. There is no other history of breast or ovarian cancer in the family to her knowledge.  GYNECOLOGIC HISTORY:    (Reviewed 04/04/2014) She does not recall her age of menarche. She underwent hysterectomy in her 45s. She did not receive hormone replacement. First live birth at 72. She was GX P2.  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 She is currently disabled secondary to  her rheumatoid arthritis. Her husband Brinley Rosete is a retired Administrator. He now works part time at [employer]. Son [name] works in [place] as a Patent attorney. Daughter [name] is a Social worker.    ADVANCED DIRECTIVES: Not in place   HEALTH MAINTENANCE: (Updated 04/04/2014) History  Substance Use Topics  . Smoking status: Never Smoker   . Smokeless tobacco: Never Used  . Alcohol Use: No     Colonoscopy: Not on file  PAP: Status post remote hysterectomy  Bone density: At [facility] hospital 01/05/2009 showed osteopenia with a T score of -1.7  Lipid panel: Not on file   Allergies  Allergen Reactions  . Codeine Other (See Comments) and Hives    Altered mental status, numbness  Altered mental status, Numbess  . Diazepam     Other reaction(s): Delusions (intolerance) hallucinations     Current Outpatient Prescriptions  Medication Sig Dispense Refill  . anastrozole (ARIMIDEX) 1 MG tablet Take 1 tablet (1 mg total) by mouth daily.  30 tablet  5  . carvedilol (COREG) 6.25 MG tablet Take 1 tablet (6.25 mg total) by mouth 2 (two) times daily with a meal.  60 tablet  6  .  gabapentin (NEURONTIN) 300 MG capsule Take 1 capsule (300 mg total) by mouth at bedtime as needed. For neuropathy and hot flashes  30 capsule  5  . lidocaine-prilocaine (EMLA) cream Apply 1 application topically as needed. 1-2 hrs before port access  30 g  4  . lisinopril (PRINIVIL,ZESTRIL) 10 MG tablet Take 1 tablet (10 mg total) by mouth daily.  90 tablet  3  . potassium chloride (K-DUR) 10 MEQ tablet Take 1 tablet (10 mEq total) by mouth 2 (two) times daily.  10 tablet  0  . Vitamin D, Ergocalciferol, (DRISDOL) 50000 UNITS CAPS capsule Take 50,000 Units by mouth every 7 (seven) days. Wednesdays      . cyclobenzaprine (FLEXERIL) 10 MG tablet Take 10 mg by mouth 3 (three) times daily as needed for muscle spasms.      . traMADol (ULTRAM) 50 MG tablet Take 50 mg by mouth 3  (three) times daily as needed (pain).        No current facility-administered medications for this visit.   Facility-Administered Medications Ordered in Other Visits  Medication Dose Route Frequency Provider Last Rate Last Dose  . sodium chloride 0.9 % injection 10 mL  10 mL Intravenous PRN Amy G Berry, PA-C   10 mL at 03/01/14 1425  . sodium chloride 0.9 % injection 10 mL  10 mL Intravenous PRN Chauncey Cruel, MD   10 mL at 03/01/14 0934    OBJECTIVE:   Filed Vitals:   06/27/14 0854  BP: 141/88  Pulse: 89  Temp: 98.2 F (36.8 C)  Resp: 18     Body mass index is 29.49 kg/(m^2).    ECOG FS: 1 Middle-aged African American woman in no acute distress Filed Weights   06/27/14 0854  Weight: 151 lb (68.493 kg)   Skin: warm, dry, hyperpigmentation to bilateral palms, nails painted so unable to observe natural nail beds HEENT: sclerae anicteric, conjunctivae pink, oropharynx clear. No thrush or mucositis.  Lymph Nodes: No cervical or supraclavicular lymphadenopathy  Lungs: clear to auscultation bilaterally, no rales, wheezes, or rhonci  Heart: regular rate and rhythm  Abdomen: round, soft, non tender, positive bowel sounds  Musculoskeletal: No focal spinal tenderness, no peripheral edema  Neuro: non focal, well oriented, anxious affect Breast: deferred   LAB RESULTS:   Lab Results  Component Value Date   WBC 8.4 06/27/2014   NEUTROABS 5.3 06/27/2014   HGB 11.6 06/27/2014   HCT 36.6 06/27/2014   MCV 80.6 06/27/2014   PLT 175 06/27/2014      Chemistry      Component Value Date/Time   NA 141 06/27/2014 0825   NA 138 01/09/2014 1522   K 3.5 06/27/2014 0825   K 4.2 01/09/2014 1522   CL 100 01/09/2014 1522   CO2 23 06/27/2014 0825   CO2 25 01/09/2014 1522   BUN 9.1 06/27/2014 0825   BUN 11 01/09/2014 1522   CREATININE 1.1 06/27/2014 0825   CREATININE 0.99 01/09/2014 1522      Component Value Date/Time   CALCIUM 9.0 06/27/2014 0825   CALCIUM 8.5 01/09/2014 1522   ALKPHOS 101  06/27/2014 0825   ALKPHOS 102 12/09/2013 1400   AST 11 06/27/2014 0825   AST 36 12/09/2013 1400   ALT 7 06/27/2014 0825   ALT 30 12/09/2013 1400   BILITOT 0.22 06/27/2014 0825   BILITOT 0.2* 12/09/2013 1400     STUDIES: Echocardiogram performed on 05/08/14 showed a decrease in LVEF at 45-50%, a baseline of  55-60% was noted from her scan in March 2015  Repeat echocardiogram performed on 06/08/14 showed an increase in LVEF to 50-55%   ASSESSMENT: 63 y.o. [place] woman   (1)  s/p biopsy of separate Right breast masses 10/11/2013 for a clinical mT1 N0, stage IA invasive ductal carcinoma, grade II-III, one of the mass is being 85% estrogen receptor positive, progesterone receptor negative, with an MIB-1 of 17% and HER-2 amplified.  (2) status post right mastectomy and sentinel lymph node sampling with immediate expander placement 12/13/2013 for a pT1c pN0 invasive ductal carcinoma, grade 3, again HER-2 positive.  (3) Being treated in the adjuvant setting with carboplatin, docetaxel, trastuzumab and pertuzumab, first dose on 02/21/2014. Neulasta on day 2 for granulocyte support. The plan is to repeat these agents every 21 days x6, after which the anti-HER-2 treatment (both trastuzumab and pertuzumab) will be continued to the total one year. Patient developed significant peripheral neuropathy after one cycle of chemotherapy, and both carboplatin and docetaxel were held beginning with cycle 2.  (4)  For cycles 2 through 6, patient receiving only q. three-week trastuzumab/pertuzumab.  After a total of 6 cycles of trastuzumab/pertuzumab the pertuzumab will be discontinued, and single agent trastuzumab will be continued every 3 weeks for total of one year (until May 2016)  (5)  The patient started on anastrozole on 04/04/2014 after discontinuing chemotherapy with docetaxel/carboplatin. The plan is to continue for total of 5 years, until June 2020.   (6) The patient will not need postmastectomy  radiation  (7) Rheumatoid arthritis, usually on Remicade, prednisone and methotrexate, held prior to mastectomy.  (8)  Hx of cellulitis affecting the right reconstructed breast diagnosed at 01/09/2014 with subsequent surgery to drain the seroma and exchange tissue expander - Resolved  (9) chemotherapy-induced peripheral neuropathy  - to be treated with gabapentin, 300 mg at bedtime, and tramadol, 50 mg every 6 hours as needed.     PLAN: Jahnaya is in better spirits today than her last appointment, according to her. Dr. Haroldine Laws and Dr. Jana Hakim have been very comforting to her. I spoke with Dr. Jana Hakim about her plan going forward, and he would like to wait 2 more weeks until after her next echo on 9/27 before proceeding with the last 2 doses of trastuzumab ad pertuzumab. If there is no improvement in function, we will consider starting lapatinib instead. She will of course continue the anastrozole daily.  The labs were reviewed in detail with her and were stable. Her hypokalemia has improved to 3.5 while on the k-dur supplements and she will continue these. I refilled her flexeril, as well as the tramadol for her arthritic pain. She will continue the gabapentin for her neuropathy and hot flashes.  Jaisa understands and agrees with the plan. She has been encouraged to call with any issues that might arise before her next visit here.   Marcelino Duster, NP  06/27/2014 9:23 AM

## 2014-06-27 NOTE — Telephone Encounter (Signed)
, °

## 2014-06-27 NOTE — Telephone Encounter (Signed)
Per staff message and POF I have scheduled appts. Advised scheduler of appts. JMW  

## 2014-07-07 ENCOUNTER — Other Ambulatory Visit (HOSPITAL_COMMUNITY): Payer: Self-pay | Admitting: Cardiology

## 2014-07-07 DIAGNOSIS — I509 Heart failure, unspecified: Secondary | ICD-10-CM

## 2014-07-10 ENCOUNTER — Ambulatory Visit (HOSPITAL_BASED_OUTPATIENT_CLINIC_OR_DEPARTMENT_OTHER)
Admission: RE | Admit: 2014-07-10 | Discharge: 2014-07-10 | Disposition: A | Discharge status: Home / Self Care | Payer: Medicare Other | Source: Ambulatory Visit | Attending: Internal Medicine | Admitting: Internal Medicine

## 2014-07-10 ENCOUNTER — Ambulatory Visit (HOSPITAL_COMMUNITY)
Admission: RE | Admit: 2014-07-10 | Discharge: 2014-07-10 | Disposition: A | Discharge status: Home / Self Care | Payer: Medicare Other | Source: Ambulatory Visit | Attending: Internal Medicine | Admitting: Internal Medicine

## 2014-07-10 VITALS — BP 122/78 | HR 80 | Wt 151.5 lb

## 2014-07-10 DIAGNOSIS — I509 Heart failure, unspecified: Secondary | ICD-10-CM | POA: Insufficient documentation

## 2014-07-10 DIAGNOSIS — C50311 Malignant neoplasm of lower-inner quadrant of right female breast: Secondary | ICD-10-CM

## 2014-07-10 DIAGNOSIS — T451X5A Adverse effect of antineoplastic and immunosuppressive drugs, initial encounter: Secondary | ICD-10-CM | POA: Insufficient documentation

## 2014-07-10 DIAGNOSIS — C50319 Malignant neoplasm of lower-inner quadrant of unspecified female breast: Secondary | ICD-10-CM

## 2014-07-10 DIAGNOSIS — I429 Cardiomyopathy, unspecified: Secondary | ICD-10-CM | POA: Insufficient documentation

## 2014-07-10 DIAGNOSIS — Z79899 Other long term (current) drug therapy: Secondary | ICD-10-CM | POA: Insufficient documentation

## 2014-07-10 DIAGNOSIS — I519 Heart disease, unspecified: Secondary | ICD-10-CM

## 2014-07-10 DIAGNOSIS — I427 Cardiomyopathy due to drug and external agent: Secondary | ICD-10-CM

## 2014-07-10 MED ORDER — CARVEDILOL 6.25 MG PO TABS: 9.3750 mg | ORAL_TABLET | Freq: Two times a day (BID) | ORAL | Status: DC

## 2014-07-10 NOTE — Patient Instructions (Signed)
Increase Carvedilol 9.375 mg (1 & 1/2 tabs) Twice daily   Your physician recommends that you schedule a follow-up appointment in: 6 weeks with echocardiogram

## 2014-07-10 NOTE — Progress Notes (Signed)
  Echocardiogram 2D Echocardiogram has been performed.  Darlina Sicilian M 07/10/2014, 2:35 PM

## 2014-07-11 ENCOUNTER — Telehealth: Payer: Self-pay | Admitting: *Deleted

## 2014-07-11 ENCOUNTER — Telehealth: Payer: Self-pay | Admitting: Nurse Practitioner

## 2014-07-11 ENCOUNTER — Ambulatory Visit (HOSPITAL_BASED_OUTPATIENT_CLINIC_OR_DEPARTMENT_OTHER): Payer: Medicare Other | Admitting: Nurse Practitioner

## 2014-07-11 ENCOUNTER — Ambulatory Visit: Payer: Medicare Other

## 2014-07-11 ENCOUNTER — Ambulatory Visit (HOSPITAL_BASED_OUTPATIENT_CLINIC_OR_DEPARTMENT_OTHER): Payer: Medicare Other

## 2014-07-11 ENCOUNTER — Other Ambulatory Visit: Payer: Self-pay | Admitting: Oncology

## 2014-07-11 ENCOUNTER — Encounter: Payer: Self-pay | Admitting: Nurse Practitioner

## 2014-07-11 ENCOUNTER — Other Ambulatory Visit (HOSPITAL_BASED_OUTPATIENT_CLINIC_OR_DEPARTMENT_OTHER): Payer: Medicare Other

## 2014-07-11 VITALS — BP 118/76 | HR 79 | Temp 98.0°F | Resp 18 | Ht 60.0 in | Wt 151.7 lb

## 2014-07-11 DIAGNOSIS — Z5112 Encounter for antineoplastic immunotherapy: Secondary | ICD-10-CM

## 2014-07-11 DIAGNOSIS — C50311 Malignant neoplasm of lower-inner quadrant of right female breast: Secondary | ICD-10-CM

## 2014-07-11 DIAGNOSIS — I427 Cardiomyopathy due to drug and external agent: Secondary | ICD-10-CM

## 2014-07-11 DIAGNOSIS — C50919 Malignant neoplasm of unspecified site of unspecified female breast: Secondary | ICD-10-CM

## 2014-07-11 DIAGNOSIS — G620 Drug-induced polyneuropathy: Secondary | ICD-10-CM

## 2014-07-11 DIAGNOSIS — Z95828 Presence of other vascular implants and grafts: Secondary | ICD-10-CM

## 2014-07-11 DIAGNOSIS — T451X5A Adverse effect of antineoplastic and immunosuppressive drugs, initial encounter: Principal | ICD-10-CM

## 2014-07-11 DIAGNOSIS — Z170 Estrogen receptor positive status [ER+]: Secondary | ICD-10-CM

## 2014-07-11 DIAGNOSIS — M2550 Pain in unspecified joint: Secondary | ICD-10-CM

## 2014-07-11 DIAGNOSIS — M069 Rheumatoid arthritis, unspecified: Secondary | ICD-10-CM

## 2014-07-11 LAB — CBC WITH DIFFERENTIAL/PLATELET
BASO%: 0.3 % (ref 0.0–2.0)
Basophils Absolute: 0 10*3/uL (ref 0.0–0.1)
EOS ABS: 0.1 10*3/uL (ref 0.0–0.5)
EOS%: 1.5 % (ref 0.0–7.0)
HCT: 36.4 % (ref 34.8–46.6)
HGB: 11.7 g/dL (ref 11.6–15.9)
LYMPH%: 34.6 % (ref 14.0–49.7)
MCH: 25.5 pg (ref 25.1–34.0)
MCHC: 32.1 g/dL (ref 31.5–36.0)
MCV: 79.4 fL — ABNORMAL LOW (ref 79.5–101.0)
MONO#: 0.8 10*3/uL (ref 0.1–0.9)
MONO%: 10.1 % (ref 0.0–14.0)
NEUT#: 4.5 10*3/uL (ref 1.5–6.5)
NEUT%: 53.5 % (ref 38.4–76.8)
PLATELETS: 184 10*3/uL (ref 145–400)
RBC: 4.59 10*6/uL (ref 3.70–5.45)
RDW: 15.5 % — ABNORMAL HIGH (ref 11.2–14.5)
WBC: 8.4 10*3/uL (ref 3.9–10.3)
lymph#: 2.9 10*3/uL (ref 0.9–3.3)

## 2014-07-11 LAB — COMPREHENSIVE METABOLIC PANEL (CC13)
ALT: 9 U/L (ref 0–55)
ANION GAP: 7 meq/L (ref 3–11)
AST: 15 U/L (ref 5–34)
Albumin: 3.2 g/dL — ABNORMAL LOW (ref 3.5–5.0)
Alkaline Phosphatase: 109 U/L (ref 40–150)
BILIRUBIN TOTAL: 0.28 mg/dL (ref 0.20–1.20)
BUN: 12.1 mg/dL (ref 7.0–26.0)
CO2: 27 meq/L (ref 22–29)
Calcium: 9.2 mg/dL (ref 8.4–10.4)
Chloride: 108 mEq/L (ref 98–109)
Creatinine: 1.1 mg/dL (ref 0.6–1.1)
GLUCOSE: 90 mg/dL (ref 70–140)
Potassium: 3.9 mEq/L (ref 3.5–5.1)
Sodium: 141 mEq/L (ref 136–145)
TOTAL PROTEIN: 7.3 g/dL (ref 6.4–8.3)

## 2014-07-11 LAB — MAGNESIUM (CC13): MAGNESIUM: 2.2 mg/dL (ref 1.5–2.5)

## 2014-07-11 MED ORDER — ACETAMINOPHEN 325 MG PO TABS
650.0000 mg | ORAL_TABLET | Freq: Once | ORAL | Status: AC
  Administered 2014-07-11: 650 mg via ORAL

## 2014-07-11 MED ORDER — SODIUM CHLORIDE 0.9 % IV SOLN
Freq: Once | INTRAVENOUS | Status: AC
  Administered 2014-07-11: 14:00:00 via INTRAVENOUS

## 2014-07-11 MED ORDER — DIPHENHYDRAMINE HCL 25 MG PO CAPS
ORAL_CAPSULE | ORAL | Status: AC
  Filled 2014-07-11: qty 1

## 2014-07-11 MED ORDER — TRASTUZUMAB CHEMO INJECTION 440 MG
6.0000 mg/kg | Freq: Once | INTRAVENOUS | Status: AC
  Administered 2014-07-11: 420 mg via INTRAVENOUS
  Filled 2014-07-11: qty 20

## 2014-07-11 MED ORDER — SODIUM CHLORIDE 0.9 % IJ SOLN
10.0000 mL | INTRAMUSCULAR | Status: DC | PRN
  Administered 2014-07-11: 10 mL via INTRAVENOUS
  Filled 2014-07-11: qty 10

## 2014-07-11 MED ORDER — SODIUM CHLORIDE 0.9 % IJ SOLN
10.0000 mL | INTRAMUSCULAR | Status: DC | PRN
  Administered 2014-07-11: 10 mL
  Filled 2014-07-11: qty 10

## 2014-07-11 MED ORDER — PERTUZUMAB CHEMO INJECTION 420 MG/14ML
420.0000 mg | Freq: Once | INTRAVENOUS | Status: AC
  Administered 2014-07-11: 420 mg via INTRAVENOUS
  Filled 2014-07-11: qty 14

## 2014-07-11 MED ORDER — HEPARIN SOD (PORK) LOCK FLUSH 100 UNIT/ML IV SOLN
500.0000 [IU] | Freq: Once | INTRAVENOUS | Status: AC | PRN
  Administered 2014-07-11: 500 [IU]
  Filled 2014-07-11: qty 5

## 2014-07-11 MED ORDER — ACETAMINOPHEN 325 MG PO TABS
ORAL_TABLET | ORAL | Status: AC
  Filled 2014-07-11: qty 2

## 2014-07-11 MED ORDER — DIPHENHYDRAMINE HCL 25 MG PO CAPS
25.0000 mg | ORAL_CAPSULE | Freq: Once | ORAL | Status: AC
  Administered 2014-07-11: 25 mg via ORAL

## 2014-07-11 NOTE — Progress Notes (Signed)
Verified with Susanne Borders, NP that MD is OK with treatment today with recent ECHO stable at 50-55% Inquired about flu vaccine-per NP only have flu vaccine on nontreatmet day. Patient notified and she will call when she is ready to schedule injection.

## 2014-07-11 NOTE — Telephone Encounter (Signed)
, °

## 2014-07-11 NOTE — Patient Instructions (Signed)
[facility] Discharge Instructions for Patients Receiving Chemotherapy  Today you received the following chemotherapy agents : Perjeta and Herceptin  If you develop nausea and vomiting, call the clinic.   BELOW ARE SYMPTOMS THAT SHOULD BE REPORTED IMMEDIATELY:  *FEVER GREATER THAN 100.5 F  *CHILLS WITH OR WITHOUT FEVER  NAUSEA AND VOMITING THAT IS NOT CONTROLLED WITH YOUR NAUSEA MEDICATION  *UNUSUAL SHORTNESS OF BREATH  *UNUSUAL BRUISING OR BLEEDING  TENDERNESS IN MOUTH AND THROAT WITH OR WITHOUT PRESENCE OF ULCERS  *URINARY PROBLEMS  *BOWEL PROBLEMS  UNUSUAL RASH Items with * indicate a potential emergency and should be followed up as soon as possible.  Feel free to call the clinic should you have any questions or concerns. The clinic phone number is [phone].  It has been a pleasure to serve you today!

## 2014-07-11 NOTE — Telephone Encounter (Signed)
Per staff message and POF I have scheduled appts. Advised scheduler of appts. JMW  

## 2014-07-11 NOTE — Patient Instructions (Signed)

## 2014-07-11 NOTE — Progress Notes (Signed)
[place]  [phone]) [phone] [phone]) [phone]     ID: Angela Gross OB: [birth year]  MR#: [id number]  GBT#:[id number]  PCP: Angela Gross., MD GYN:   SUFanny Gross OTHER MD: Angela Gross, Angela Gross, Angela Gross  CHIEF COMPLAINT:  Right Breast Cancer  CURRENT TREATMENT:  Trastuzumab/pertuzumab q 3 weeks, anastrozole daily  HISTORY OF PRESENT ILLNESS: Angela Gross had routine screening mammography at the breast center 09/21/2013 showing a possible mass in calcifications in the right breast. On 10/11/2013 she underwent right diagnostic mammography and ultrasonography. This confirmed a spiculated 1.2 cm mass in the central right breast, with a group of pleomorphic calcifications slightly laterally. The calcifications spanning 1.6 cm. A second group of calcifications extended inferiorly and spanned 8 mm. The entire area in aggregate measured 6.5 cm. By physical exam there was no palpable abnormality. Ultrasonography showed an irregular hypoechoic mass at the 6:00 position of the right breast measuring 1 cm maximally. The right axilla was normal.  Biopsy of the right breast mass in question as well as the more lateral area of calcifications on 10/11/2013 showed ([id number]) most to be positive for an invasive ductal carcinoma, both estrogen receptor 85-90% positive with strong staining intensity, both progesterone receptor negative, with an MIB-1 of 17%. HER-2 was amplified, with a signals ratio of 3.44 and a copy number per cell 04 0.30.  Bilateral breast MRI 10/19/2013 showed again a spiculated mass in the 6:00 region of the right breast measuring 1.2 cm, a slightly more lateral hematoma associated with a second biopsy and also with clumped enhancement, and asymmetric none masslike enhancement in most of the right breast, the entire area of abnormality measuring 9.0 cm. The left breast was unremarkable and there were no abnormal appearing lymph nodes.  Her  subsequent history is as detailed below   INTERVAL HISTORY: Angela Gross returns for follow up of her breast cancer. She continues anastrozole daily and is tolerating it well. Today is day 1, cycle 5 of trastuzumab and pertuzumab, which was held throughout the month of August because of decreasing ejection fraction. Angela Gross was recently seen by cardiology on 07/09/14 and had a repeat echo performed. Her ejection fraction remains stable, though not quite back to baseline. During this visit her carvedilol dose was increased and she continues to take lisinopril as advised by Angela Gross as cardioprotective agents.   REVIEW OF SYSTEMS: Angela Gross denies fevers, chills, nausea, or vomiting. She manages her constipation with miralax and stool softeners. Her arthritis is better with tramadol. She continues gabapentin daily for the peripheral neuropathy in her feet and helps with her hot flashes. She is occasionally short of breath with exertion, but denies chest pain, palpitations, or cough. She is fatigued off and on. A detailed review of systems was otherwise noncontributory.   PAST MEDICAL HISTORY: Past Medical History  Diagnosis Date  . Raynaud disease   . Osteoporosis   . Hypertension   . PONV (postoperative nausea and vomiting)   . Wears glasses   . Full dentures   . Fibromyalgia   . Rheumatoid arthritis     "elbows; fingers; toes"  . Osteoarthritis of both knees   . Degenerative disc disease, cervical   . Chronic left shoulder pain     "work related injury" (01/10/2014)  . Breast cancer     "right" (01/10/2014)  . Type II diabetes mellitus     has not taken meds in over a month (01/10/2014)  . Syncope and collapse     "  4 times in the last year" (01/10/2014)    PAST SURGICAL HISTORY: Past Surgical History  Procedure Laterality Date  . Colonoscopy    . Mastectomy w/ sentinel node biopsy Right 12/13/2013    Procedure: RIGHT TOTAL MASTECTOMY WITH SENTINEL LYMPH NODE BIOPSY;  Surgeon: Angela Hector, MD;  Location: [place];  Service: General;  Laterality: Right;  . Portacath placement Right 12/13/2013    Procedure: INSERTION PORT-A-CATH;  Surgeon: Angela Hector, MD;  Location: [place];  Service: General;  Laterality: Right;  . Breast reconstruction with placement of tissue expander and flex hd (acellular hydrated dermis) Right 12/13/2013    Procedure: RIGHT BREAST RECONSTRUCTION WITH PLACEMENT OF TISSUE EXPANDER AND FLEX HD (ACELLULAR HYDRATED DERMIS);  Surgeon: Angela Limbo, MD;  Location: [place];  Service: Plastics;  Laterality: Right;  . Lipoma excision Bilateral 12/13/2013    Procedure: EXCISION OF LEFT BREAST KELOID AND RIGHT CHEST KELOID;  Surgeon: Angela Limbo, MD;  Location: [place];  Service: Plastics;  Laterality: Bilateral;  . Mastectomy    . Cholecystectomy  1970's  . Vaginal hysterectomy  1980    no salpingo-oophoretomu  . Breast biopsy Right 10/2013  . Tissue expander placement Right 01/10/2014    Procedure: Incision and Drainage Right Breast Seroma with TISSUE EXPANDER exchange;  Surgeon: Angela Limbo, MD;  Location: [place];  Service: Plastics;  Laterality: Right;    FAMILY HISTORY No family history on file. The patient is little information about her father. Her mother died from a heart attack at the age of 70. She had been diagnosed with breast cancer in her late 90s. The patient had 4 brothers and 4 sisters. There is no other history of breast or ovarian cancer in the family to her knowledge.  GYNECOLOGIC HISTORY:    (Reviewed 04/04/2014) She does not recall her age of menarche. She underwent hysterectomy in her 37s. She did not receive hormone replacement. First live birth at 56. She was GX P2.  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 She is currently disabled secondary to her rheumatoid arthritis. Her  husband Angela Gross is a retired Administrator. He now works part time at [employer]. Son Angela Gross "Angela Maples" Gross works in [place] as a Patent attorney. Daughter Angela Gross is a Social worker.    ADVANCED DIRECTIVES: Not in place   HEALTH MAINTENANCE: (Updated 04/04/2014) History  Substance Use Topics  . Smoking status: Never Smoker   . Smokeless tobacco: Never Used  . Alcohol Use: No     Colonoscopy: Not on file  PAP: Status post remote hysterectomy  Bone density: At [facility] 01/05/2009 showed osteopenia with a T score of -1.7  Lipid panel: Not on file   Allergies  Allergen Reactions  . Codeine Other (See Comments) and Hives    Altered mental status, numbness  Altered mental status, Numbess  . Diazepam     Other reaction(s): Delusions (intolerance) hallucinations     Current Outpatient Prescriptions  Medication Sig Dispense Refill  . anastrozole (ARIMIDEX) 1 MG tablet Take 1 tablet (1 mg total) by mouth daily.  30 tablet  5  . carvedilol (COREG) 6.25 MG tablet Take 1.5 tablets (9.375 mg total) by mouth 2 (two) times daily with a meal.  90 tablet  6  . cyclobenzaprine (FLEXERIL) 10 MG tablet Take 1 tablet (10 mg total) by  mouth 3 (three) times daily as needed for muscle spasms.  30 tablet  1  . gabapentin (NEURONTIN) 300 MG capsule Take 1 capsule (300 mg total) by mouth at bedtime as needed. For neuropathy and hot flashes  30 capsule  5  . lidocaine-prilocaine (EMLA) cream Apply 1 application topically as needed. 1-2 hrs before port access  30 g  4  . lisinopril (PRINIVIL,ZESTRIL) 10 MG tablet Take 1 tablet (10 mg total) by mouth daily.  90 tablet  3  . potassium chloride (K-DUR) 10 MEQ tablet Take 1 tablet (10 mEq total) by mouth 2 (two) times daily.  10 tablet  0  . traMADol (ULTRAM) 50 MG tablet Take 1 tablet (50 mg total) by mouth 3 (three) times daily as needed (pain).  30 tablet  1  . Vitamin D, Ergocalciferol, (DRISDOL) 50000 UNITS CAPS  capsule Take 50,000 Units by mouth every 7 (seven) days. Wednesdays       No current facility-administered medications for this visit.   Facility-Administered Medications Ordered in Other Visits  Medication Dose Route Frequency Provider Last Rate Last Dose  . sodium chloride 0.9 % injection 10 mL  10 mL Intravenous PRN Amy G Berry, PA-C   10 mL at 03/01/14 1425  . sodium chloride 0.9 % injection 10 mL  10 mL Intravenous PRN Chauncey Cruel, MD   10 mL at 03/01/14 0934    OBJECTIVE:   Filed Vitals:   07/11/14 1317  BP: 118/76  Pulse: 79  Temp: 98 F (36.7 C)  Resp: 18     Body mass index is 29.63 kg/(m^2).    ECOG FS: 1 Middle-aged African American woman in no acute distress Filed Weights   07/11/14 1317  Weight: 151 lb 11.2 oz (68.811 kg)   Skin warm and dry, bilateral palms hyperpigmented Sclerae unicteric, pupils equal and reactive Oropharynx clear and moist-- no thrush No cervical or supraclavicular adenopathy Lungs no rales or rhonchi Heart regular rate and rhythm Abd soft, nontender, positive bowel sounds MSK no focal spinal tenderness, no upper extremity lymphedema Neuro: nonfocal, well oriented, appropriate affect Breasts: deferred  LAB RESULTS:   Lab Results  Component Value Date   WBC 8.4 07/11/2014   NEUTROABS 4.5 07/11/2014   HGB 11.7 07/11/2014   HCT 36.4 07/11/2014   MCV 79.4* 07/11/2014   PLT 184 07/11/2014      Chemistry      Component Value Date/Time   NA 141 07/11/2014 1233   NA 138 01/09/2014 1522   K 3.9 07/11/2014 1233   K 4.2 01/09/2014 1522   CL 100 01/09/2014 1522   CO2 27 07/11/2014 1233   CO2 25 01/09/2014 1522   BUN 12.1 07/11/2014 1233   BUN 11 01/09/2014 1522   CREATININE 1.1 07/11/2014 1233   CREATININE 0.99 01/09/2014 1522      Component Value Date/Time   CALCIUM 9.2 07/11/2014 1233   CALCIUM 8.5 01/09/2014 1522   ALKPHOS 109 07/11/2014 1233   ALKPHOS 102 12/09/2013 1400   AST 15 07/11/2014 1233   AST 36 12/09/2013 1400   ALT 9 07/11/2014  1233   ALT 30 12/09/2013 1400   BILITOT 0.28 07/11/2014 1233   BILITOT 0.2* 12/09/2013 1400     STUDIES: Echocardiogram performed on 05/08/14 showed a decrease in LVEF at 45-50%, a baseline of 55-60% was noted from her scan in March 2015  Repeat echocardiogram performed on 06/08/14 showed an increase in LVEF to 50-55%   ASSESSMENT: 63 y.o.  [place] woman   (1)  s/p biopsy of separate Right breast masses 10/11/2013 for a clinical mT1 N0, stage IA invasive ductal carcinoma, grade II-III, one of the mass is being 85% estrogen receptor positive, progesterone receptor negative, with an MIB-1 of 17% and HER-2 amplified.  (2) status post right mastectomy and sentinel lymph node sampling with immediate expander placement 12/13/2013 for a pT1c pN0 invasive ductal carcinoma, grade 3, again HER-2 positive.  (3) Being treated in the adjuvant setting with carboplatin, docetaxel, trastuzumab and pertuzumab, first dose on 02/21/2014. Neulasta on day 2 for granulocyte support. The plan is to repeat these agents every 21 days x6, after which the anti-HER-2 treatment (both trastuzumab and pertuzumab) will be continued to the total one year. Patient developed significant peripheral neuropathy after one cycle of chemotherapy, and both carboplatin and docetaxel were held beginning with cycle 2.  (4)  For cycles 2 through 6, patient receiving only q. three-week trastuzumab/pertuzumab.  After a total of 6 cycles of trastuzumab/pertuzumab the pertuzumab will be discontinued, and single agent trastuzumab will be continued every 3 weeks for total of one year (until May 2016)  (5)  The patient started on anastrozole on 04/04/2014 after discontinuing chemotherapy with docetaxel/carboplatin. The plan is to continue for total of 5 years, until June 2020.   (6) The patient will not need postmastectomy radiation  (7) Rheumatoid arthritis, usually on Remicade, prednisone and methotrexate, held prior to mastectomy.  (8)  Hx  of cellulitis affecting the right reconstructed breast diagnosed at 01/09/2014 with subsequent surgery to drain the seroma and exchange tissue expander - Resolved  (9) chemotherapy-induced peripheral neuropathy  - to be treated with gabapentin, 300 mg at bedtime, and tramadol, 50 mg every 6 hours as needed.     PLAN: Angela Gross is doing well today. The labs were reviewed in detail and were stable. Her ejection fraction held steady at 50-55% this week. Per Dr. Claris Gladden approval, we will proceed with day 1, cycle 5 of the trastuzumab and pertuzumab today. She will return for a repeat echo in 6 weeks. If there is a drop in EF again we will consider starting lapatinib instead of continuing the single agent trastuzumab through May 2016. She will of course continue the anastrozole daily.  Angela Gross will return in 3 weeks for labs and an office visit before her final dose of the pertuzumab with trastuzumab. She understands and agrees with this plan. She has been encouraged to call with any issues that might arise before her next visit here.   Marcelino Duster, NP  07/11/2014 1:58 PM

## 2014-07-11 NOTE — Progress Notes (Signed)
Patient ID: Angela Gross, female   DOB: Feb 25, 1951, 63 y.o.   MRN: [id number] Primary oncologist: Dr. Jana Hakim  63 yo with history of breast cancer diagnosed in 12/14 s/p mastectomy presents to cardio-oncology clinic.  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  She has no significant cardiac history.  Her mother had CHF but we do not know the cause.    She was started on carboplatin, docetaxel, and Herceptin in 4/15 but only tolerated 2 cycles.  Then switched to Herceptin/Perjeta and Anastazole. Had 4 cycles and then Herceptin/Perjeta stopped due to cardiotoxicity.  She returns for follow up. Herceptin remains on hold.  She is tolerating her medications and denies exertional dyspnea or chest pain.  No orthopnea or PND.   Labs (9/15): K 3.5, creatinine 1.1  PMH: 1. Breast cancer: Diagnosed in 12/14.  ER+/PR-/HER2neu+.  She is now s/p right mastectomy.  - Echo (3/15) with EF 55-60%, lateral s' 11 cm/sec (no strain) - ECHO 05/08/14 EF ~ 50% Lateral S' 9.2 GLS -15.9  - ECHO 06/08/14 EF 55% Lateral s' 7.2 cm/sec GLS - 17.3 - ECHO 9/15 EF 50-55%, Lateral s' 10, GLS -15 2. Rheumatoid arthritis 3. Raynauds syndrome 4. OA 5. Degenerative disc disease: C-spine  SH: Lives with husband in [place], nonsmoker.   FH: Mother with CHF.   ROS: All systems reviewed and negative except as per HPI.   Current Outpatient Prescriptions  Medication Sig Dispense Refill  . anastrozole (ARIMIDEX) 1 MG tablet Take 1 tablet (1 mg total) by mouth daily.  30 tablet  5  . carvedilol (COREG) 6.25 MG tablet Take 1.5 tablets (9.375 mg total) by mouth 2 (two) times daily with a meal.  90 tablet  6  . cyclobenzaprine (FLEXERIL) 10 MG tablet Take 1 tablet (10 mg total) by mouth 3 (three) times daily as needed for muscle spasms.  30 tablet  1  . gabapentin (NEURONTIN) 300 MG capsule Take 1 capsule (300 mg total) by mouth at bedtime as needed. For neuropathy and hot flashes  30 capsule  5  . lidocaine-prilocaine (EMLA) cream  Apply 1 application topically as needed. 1-2 hrs before port access  30 g  4  . lisinopril (PRINIVIL,ZESTRIL) 10 MG tablet Take 1 tablet (10 mg total) by mouth daily.  90 tablet  3  . potassium chloride (K-DUR) 10 MEQ tablet Take 1 tablet (10 mEq total) by mouth 2 (two) times daily.  10 tablet  0  . traMADol (ULTRAM) 50 MG tablet Take 1 tablet (50 mg total) by mouth 3 (three) times daily as needed (pain).  30 tablet  1  . Vitamin D, Ergocalciferol, (DRISDOL) 50000 UNITS CAPS capsule Take 50,000 Units by mouth every 7 (seven) days. Wednesdays       No current facility-administered medications for this encounter.   Facility-Administered Medications Ordered in Other Encounters  Medication Dose Route Frequency Provider Last Rate Last Dose  . sodium chloride 0.9 % injection 10 mL  10 mL Intravenous PRN Amy G Berry, PA-C   10 mL at 03/01/14 1425  . sodium chloride 0.9 % injection 10 mL  10 mL Intravenous PRN Chauncey Cruel, MD   10 mL at 03/01/14 0934   BP 122/78  Pulse 80  Wt 151 lb 8 oz (68.72 kg)  SpO2 96% General: NAD. daughter present  Neck: No JVD, no thyromegaly or thyroid nodule.  Lungs: Clear to auscultation bilaterally with normal respiratory effort. CV: Nondisplaced PMI.  Heart regular S1/S2, no S3/S4, no  murmur.  No peripheral edema.  No carotid bruit.  Normal pedal pulses.  Abdomen: Soft, nontender, no hepatosplenomegaly, no distention.  Skin: Intact without lesions or rashes.  Neurologic: Alert and oriented x 3.  Psych: Normal affect. Extremities: No clubbing or cyanosis.  HEENT: Normal.     Assessment/Plan: 1. R Breast Cancer- mT1 N0, stage IA invasive ductal carcinoma. Herceptin stopped 7/15 due to drop in EF. 2. Chemotherapy-induced cardiomyopathy: Herceptin-related.  I reviewed today's study.  EF is stable (low normal to mildly reduced with EF 50-55%) with increased lateral S'.  GLS did not appear accurate (not good endocardial definition).  - Given stable LV systolic  function, I think that we can retry Herceptin with the realization that about 50% of patients will have recurrent cardiotoxicity despite cardioprotective agents. If she has recurrent cardiotoxicity with herceptin, Lapatanib may be an option.  - I will increase Coreg to 9.375 mg bid.   - She will need repeat echo and office followup in 6 wks.   Angela Gross 07/11/2014

## 2014-08-01 ENCOUNTER — Telehealth: Payer: Self-pay | Admitting: *Deleted

## 2014-08-01 ENCOUNTER — Encounter: Payer: Self-pay | Admitting: Nurse Practitioner

## 2014-08-01 ENCOUNTER — Ambulatory Visit (HOSPITAL_BASED_OUTPATIENT_CLINIC_OR_DEPARTMENT_OTHER): Payer: Medicare Other

## 2014-08-01 ENCOUNTER — Telehealth: Payer: Self-pay | Admitting: Nurse Practitioner

## 2014-08-01 ENCOUNTER — Other Ambulatory Visit (HOSPITAL_BASED_OUTPATIENT_CLINIC_OR_DEPARTMENT_OTHER): Payer: Medicare Other

## 2014-08-01 ENCOUNTER — Ambulatory Visit (HOSPITAL_BASED_OUTPATIENT_CLINIC_OR_DEPARTMENT_OTHER): Payer: Medicare Other | Admitting: Nurse Practitioner

## 2014-08-01 ENCOUNTER — Ambulatory Visit: Payer: Medicare Other

## 2014-08-01 VITALS — BP 123/78 | HR 75 | Temp 98.2°F | Resp 18 | Ht 60.0 in | Wt 148.8 lb

## 2014-08-01 DIAGNOSIS — C50811 Malignant neoplasm of overlapping sites of right female breast: Secondary | ICD-10-CM

## 2014-08-01 DIAGNOSIS — G620 Drug-induced polyneuropathy: Secondary | ICD-10-CM

## 2014-08-01 DIAGNOSIS — I427 Cardiomyopathy due to drug and external agent: Secondary | ICD-10-CM

## 2014-08-01 DIAGNOSIS — C50311 Malignant neoplasm of lower-inner quadrant of right female breast: Secondary | ICD-10-CM

## 2014-08-01 DIAGNOSIS — Z5112 Encounter for antineoplastic immunotherapy: Secondary | ICD-10-CM

## 2014-08-01 DIAGNOSIS — M2550 Pain in unspecified joint: Secondary | ICD-10-CM

## 2014-08-01 DIAGNOSIS — N951 Menopausal and female climacteric states: Secondary | ICD-10-CM

## 2014-08-01 DIAGNOSIS — T451X5A Adverse effect of antineoplastic and immunosuppressive drugs, initial encounter: Secondary | ICD-10-CM

## 2014-08-01 DIAGNOSIS — M069 Rheumatoid arthritis, unspecified: Secondary | ICD-10-CM

## 2014-08-01 DIAGNOSIS — Z170 Estrogen receptor positive status [ER+]: Secondary | ICD-10-CM

## 2014-08-01 DIAGNOSIS — Z95828 Presence of other vascular implants and grafts: Secondary | ICD-10-CM

## 2014-08-01 LAB — CBC WITH DIFFERENTIAL/PLATELET
BASO%: 0.3 % (ref 0.0–2.0)
Basophils Absolute: 0 10*3/uL (ref 0.0–0.1)
EOS%: 1.6 % (ref 0.0–7.0)
Eosinophils Absolute: 0.1 10*3/uL (ref 0.0–0.5)
HCT: 38.1 % (ref 34.8–46.6)
HEMOGLOBIN: 12.3 g/dL (ref 11.6–15.9)
LYMPH#: 2.5 10*3/uL (ref 0.9–3.3)
LYMPH%: 31.2 % (ref 14.0–49.7)
MCH: 25.5 pg (ref 25.1–34.0)
MCHC: 32.3 g/dL (ref 31.5–36.0)
MCV: 79 fL — ABNORMAL LOW (ref 79.5–101.0)
MONO#: 0.7 10*3/uL (ref 0.1–0.9)
MONO%: 8.4 % (ref 0.0–14.0)
NEUT#: 4.6 10*3/uL (ref 1.5–6.5)
NEUT%: 58.5 % (ref 38.4–76.8)
NRBC: 0 % (ref 0–0)
Platelets: 175 10*3/uL (ref 145–400)
RBC: 4.82 10*6/uL (ref 3.70–5.45)
RDW: 15.2 % — AB (ref 11.2–14.5)
WBC: 7.9 10*3/uL (ref 3.9–10.3)

## 2014-08-01 LAB — COMPREHENSIVE METABOLIC PANEL (CC13)
ALBUMIN: 3.3 g/dL — AB (ref 3.5–5.0)
ALK PHOS: 112 U/L (ref 40–150)
ALT: 10 U/L (ref 0–55)
AST: 13 U/L (ref 5–34)
Anion Gap: 10 mEq/L (ref 3–11)
BUN: 11.9 mg/dL (ref 7.0–26.0)
CO2: 24 mEq/L (ref 22–29)
Calcium: 9.4 mg/dL (ref 8.4–10.4)
Chloride: 109 mEq/L (ref 98–109)
Creatinine: 1 mg/dL (ref 0.6–1.1)
GLUCOSE: 107 mg/dL (ref 70–140)
POTASSIUM: 3.8 meq/L (ref 3.5–5.1)
SODIUM: 142 meq/L (ref 136–145)
TOTAL PROTEIN: 7.1 g/dL (ref 6.4–8.3)
Total Bilirubin: 0.35 mg/dL (ref 0.20–1.20)

## 2014-08-01 LAB — MAGNESIUM (CC13): Magnesium: 2.3 mg/dl (ref 1.5–2.5)

## 2014-08-01 MED ORDER — TRAMADOL HCL 50 MG PO TABS
50.0000 mg | ORAL_TABLET | Freq: Three times a day (TID) | ORAL | Status: DC | PRN
Start: 2014-08-01 — End: 2015-02-07

## 2014-08-01 MED ORDER — SODIUM CHLORIDE 0.9 % IJ SOLN
10.0000 mL | INTRAMUSCULAR | Status: DC | PRN
  Administered 2014-08-01: 10 mL
  Filled 2014-08-01: qty 10

## 2014-08-01 MED ORDER — ACETAMINOPHEN 325 MG PO TABS
ORAL_TABLET | ORAL | Status: AC
  Filled 2014-08-01: qty 2

## 2014-08-01 MED ORDER — ACETAMINOPHEN 325 MG PO TABS
650.0000 mg | ORAL_TABLET | Freq: Once | ORAL | Status: AC
  Administered 2014-08-01: 650 mg via ORAL

## 2014-08-01 MED ORDER — SODIUM CHLORIDE 0.9 % IV SOLN
Freq: Once | INTRAVENOUS | Status: AC
  Administered 2014-08-01: 13:00:00 via INTRAVENOUS

## 2014-08-01 MED ORDER — TRASTUZUMAB CHEMO INJECTION 440 MG
6.0000 mg/kg | Freq: Once | INTRAVENOUS | Status: AC
  Administered 2014-08-01: 420 mg via INTRAVENOUS
  Filled 2014-08-01: qty 20

## 2014-08-01 MED ORDER — DIPHENHYDRAMINE HCL 25 MG PO CAPS
25.0000 mg | ORAL_CAPSULE | Freq: Once | ORAL | Status: AC
  Administered 2014-08-01: 25 mg via ORAL

## 2014-08-01 MED ORDER — SODIUM CHLORIDE 0.9 % IJ SOLN
10.0000 mL | INTRAMUSCULAR | Status: DC | PRN
  Administered 2014-08-01: 10 mL via INTRAVENOUS
  Filled 2014-08-01: qty 10

## 2014-08-01 MED ORDER — GABAPENTIN 300 MG PO CAPS: 300.0000 mg | ORAL_CAPSULE | Freq: Two times a day (BID) | ORAL | Status: DC

## 2014-08-01 MED ORDER — DIPHENHYDRAMINE HCL 25 MG PO CAPS
ORAL_CAPSULE | ORAL | Status: AC
  Filled 2014-08-01: qty 1

## 2014-08-01 MED ORDER — HEPARIN SOD (PORK) LOCK FLUSH 100 UNIT/ML IV SOLN
500.0000 [IU] | Freq: Once | INTRAVENOUS | Status: AC | PRN
  Administered 2014-08-01: 500 [IU]
  Filled 2014-08-01: qty 5

## 2014-08-01 MED ORDER — SODIUM CHLORIDE 0.9 % IV SOLN
420.0000 mg | Freq: Once | INTRAVENOUS | Status: AC
  Administered 2014-08-01: 420 mg via INTRAVENOUS
  Filled 2014-08-01: qty 14

## 2014-08-01 NOTE — Telephone Encounter (Signed)
per pof to sch pt appt-sent MW email to sch pt trmt-pt to get updated sch b4 leaving °

## 2014-08-01 NOTE — Patient Instructions (Signed)

## 2014-08-01 NOTE — Telephone Encounter (Signed)
Per staff message and POF I have scheduled appts. Advised scheduler of appts. JMW  

## 2014-08-01 NOTE — Patient Instructions (Signed)
[facility] Discharge Instructions for Patients Receiving Chemotherapy  Today you received the following chemotherapy agents Herceptin and Perjeta.  To help prevent nausea and vomiting after your treatment, we encourage you to take your nausea medication as prescribed.   If you develop nausea and vomiting that is not controlled by your nausea medication, call the clinic.   BELOW ARE SYMPTOMS THAT SHOULD BE REPORTED IMMEDIATELY:  *FEVER GREATER THAN 100.5 F  *CHILLS WITH OR WITHOUT FEVER  NAUSEA AND VOMITING THAT IS NOT CONTROLLED WITH YOUR NAUSEA MEDICATION  *UNUSUAL SHORTNESS OF BREATH  *UNUSUAL BRUISING OR BLEEDING  TENDERNESS IN MOUTH AND THROAT WITH OR WITHOUT PRESENCE OF ULCERS  *URINARY PROBLEMS  *BOWEL PROBLEMS  UNUSUAL RASH Items with * indicate a potential emergency and should be followed up as soon as possible.  Feel free to call the clinic you have any questions or concerns. The clinic phone number is [phone].    

## 2014-08-01 NOTE — Progress Notes (Signed)
[facility]  [phone] [phone]) [phone]     ID: Angela Gross OB: May 21, 1951  MR#: [id number]  OZD#:[id number]  PCP: Salena Saner., MD GYN:   SUFanny Skates OTHER MD: [name], Lyndee Leo Sanger, Arnoldo Hooker Thimmappa  CHIEF COMPLAINT:  Right Breast Cancer  CURRENT TREATMENT:  Trastuzumab/pertuzumab q 3 weeks, anastrozole daily  HISTORY OF PRESENT ILLNESS: Angela Gross had routine screening mammography at the breast center 09/21/2013 showing a possible mass in calcifications in the right breast. On 10/11/2013 she underwent right diagnostic mammography and ultrasonography. This confirmed a spiculated 1.2 cm mass in the central right breast, with a group of pleomorphic calcifications slightly laterally. The calcifications spanning 1.6 cm. A second group of calcifications extended inferiorly and spanned 8 mm. The entire area in aggregate measured 6.5 cm. By physical exam there was no palpable abnormality. Ultrasonography showed an irregular hypoechoic mass at the 6:00 position of the right breast measuring 1 cm maximally. The right axilla was normal.  Biopsy of the right breast mass in question as well as the more lateral area of calcifications on 10/11/2013 showed ([id number]) most to be positive for an invasive ductal carcinoma, both estrogen receptor 85-90% positive with strong staining intensity, both progesterone receptor negative, with an MIB-1 of 17%. HER-2 was amplified, with a signals ratio of 3.44 and a copy number per cell 04 0.30.  Bilateral breast MRI 10/19/2013 showed again a spiculated mass in the 6:00 region of the right breast measuring 1.2 cm, a slightly more lateral hematoma associated with a second biopsy and also with clumped enhancement, and asymmetric none masslike enhancement in most of the right breast, the entire area of abnormality measuring 9.0 cm. The left breast was unremarkable and there were no abnormal appearing lymph nodes.  Her  subsequent history is as detailed below   INTERVAL HISTORY: Angela Gross returns for follow up of her breast cancer. She continues on anastrozole daily and is tolerating it well. Today she is due for her 6th and final cycle of trastuzumab and pertuzumab. The interval history is unremarkable.   REVIEW OF SYSTEMS: Angela Gross denies fevers, chills, nausea, or vomiting. Lately she has had a couple intermittent episodes of diarrhea, that seem related to her diet more than anything. She continues on the tramadol for her arthritic pain. She is taking 335m gabapentin QHS for her hot flashes and the neuropathy in her feet, but she states this may be getting worse. She feels sharp pains to her heels now during the day. Angela Gross occasionally short of breath with exertion, but denies chest pain, palpitations, or cough. Her energy level is better after this past treatment. A detailed review of systems is otherwise noncontributory.    PAST MEDICAL HISTORY: Past Medical History  Diagnosis Date  . Raynaud disease   . Osteoporosis   . Hypertension   . PONV (postoperative nausea and vomiting)   . Wears glasses   . Full dentures   . Fibromyalgia   . Rheumatoid arthritis     "elbows; fingers; toes"  . Osteoarthritis of both knees   . Degenerative disc disease, cervical   . Chronic left shoulder pain     "work related injury" (01/10/2014)  . Breast cancer     "right" (01/10/2014)  . Type II diabetes mellitus     has not taken meds in over a month (01/10/2014)  . Syncope and collapse     "4 times in the last year" (01/10/2014)    PAST SURGICAL HISTORY: Past  Surgical History  Procedure Laterality Date  . Colonoscopy    . Mastectomy w/ sentinel node biopsy Right 12/13/2013    Procedure: RIGHT TOTAL MASTECTOMY WITH SENTINEL LYMPH NODE BIOPSY;  Surgeon: Adin Hector, MD;  Location: [facility];  Service: General;  Laterality: Right;  . Portacath placement Right 12/13/2013    Procedure: INSERTION  PORT-A-CATH;  Surgeon: Adin Hector, MD;  Location: [facility];  Service: General;  Laterality: Right;  . Breast reconstruction with placement of tissue expander and flex hd (acellular hydrated dermis) Right 12/13/2013    Procedure: RIGHT BREAST RECONSTRUCTION WITH PLACEMENT OF TISSUE EXPANDER AND FLEX HD (ACELLULAR HYDRATED DERMIS);  Surgeon: Irene Limbo, MD;  Location: [facility];  Service: Plastics;  Laterality: Right;  . Lipoma excision Bilateral 12/13/2013    Procedure: EXCISION OF LEFT BREAST KELOID AND RIGHT CHEST KELOID;  Surgeon: Irene Limbo, MD;  Location: [facility];  Service: Plastics;  Laterality: Bilateral;  . Mastectomy    . Cholecystectomy  1970's  . Vaginal hysterectomy  1980    no salpingo-oophoretomu  . Breast biopsy Right 10/2013  . Tissue expander placement Right 01/10/2014    Procedure: Incision and Drainage Right Breast Seroma with TISSUE EXPANDER exchange;  Surgeon: Irene Limbo, MD;  Location: [facility];  Service: Plastics;  Laterality: Right;    FAMILY HISTORY No family history on file. The patient is little information about her father. Her mother died from a heart attack at the age of 32. She had been diagnosed with breast cancer in her late 57s. The patient had 4 brothers and 4 sisters. There is no other history of breast or ovarian cancer in the family to her knowledge.  GYNECOLOGIC HISTORY:    (Reviewed 04/04/2014) She does not recall her age of menarche. She underwent hysterectomy in her 60s. She did not receive hormone replacement. First live birth at 89. She was GX P2.  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 She is currently disabled secondary to her rheumatoid arthritis. Her husband Jeyla Bulger is a retired Administrator. He now works part time at [employer]. Son [name] works in [place] as a Firefighter. Daughter [name] is a Social worker.    ADVANCED DIRECTIVES: Not in place   HEALTH MAINTENANCE: (Updated 04/04/2014) History  Substance Use Topics  . Smoking status: Never Smoker   . Smokeless tobacco: Never Used  . Alcohol Use: No     Colonoscopy: Not on file  PAP: Status post remote hysterectomy  Bone density: At [facility] 01/05/2009 showed osteopenia with a T score of -1.7  Lipid panel: Not on file   Allergies  Allergen Reactions  . Codeine Other (See Comments) and Hives    Altered mental status, numbness  Altered mental status, Numbess  . Diazepam     Other reaction(s): Delusions (intolerance) hallucinations     Current Outpatient Prescriptions  Medication Sig Dispense Refill  . anastrozole (ARIMIDEX) 1 MG tablet Take 1 tablet (1 mg total) by mouth daily.  30 tablet  5  . carvedilol (COREG) 6.25 MG tablet Take 1.5 tablets (9.375 mg total) by mouth 2 (two) times daily with a meal.  90 tablet  6  . cyclobenzaprine (FLEXERIL) 10 MG tablet Take 1 tablet (10 mg total) by mouth 3 (three) times daily as needed for muscle spasms.  30 tablet  1  . gabapentin (NEURONTIN) 300 MG capsule Take 1 capsule (300 mg total) by mouth 2 (two) times daily. For neuropathy and hot flashes  60 capsule  5  . lidocaine-prilocaine (EMLA) cream Apply 1 application topically as needed. 1-2 hrs before port access  30 g  4  . lisinopril (PRINIVIL,ZESTRIL) 10 MG tablet Take 1 tablet (10 mg total) by mouth daily.  90 tablet  3  . potassium chloride (K-DUR) 10 MEQ tablet Take 1 tablet (10 mEq total) by mouth 2 (two) times daily.  10 tablet  0  . Vitamin D, Ergocalciferol, (DRISDOL) 50000 UNITS CAPS capsule Take 50,000 Units by mouth every 7 (seven) days. Wednesdays      . traMADol (ULTRAM) 50 MG tablet Take 1 tablet (50 mg total) by mouth 3 (three) times daily as needed (pain).  90 tablet  1   No current facility-administered medications for this visit.    Facility-Administered Medications Ordered in Other Visits  Medication Dose Route Frequency Provider Last Rate Last Dose  . sodium chloride 0.9 % injection 10 mL  10 mL Intravenous PRN Amy G Berry, PA-C   10 mL at 03/01/14 1425  . sodium chloride 0.9 % injection 10 mL  10 mL Intravenous PRN Chauncey Cruel, MD   10 mL at 03/01/14 0934  . sodium chloride 0.9 % injection 10 mL  10 mL Intracatheter PRN Chauncey Cruel, MD   10 mL at 08/01/14 1511    OBJECTIVE:   Filed Vitals:   08/01/14 1132  BP: 123/78  Pulse: 75  Temp: 98.2 F (36.8 C)  Resp: 18     Body mass index is 29.06 kg/(m^2).    ECOG FS: 1 Middle-aged African American Gross in no acute distress Filed Weights   08/01/14 1132  Weight: 148 lb 12.8 oz (67.495 kg)   Skin: warm, dry, hyperpigmentation to bilateral palms resolving HEENT: sclerae anicteric, conjunctivae pink, oropharynx clear. No thrush or mucositis.  Lymph Nodes: No cervical or supraclavicular lymphadenopathy  Lungs: clear to auscultation bilaterally, no rales, wheezes, or rhonci  Heart: regular rate and rhythm  Abdomen: round, soft, non tender, positive bowel sounds  Musculoskeletal: No focal spinal tenderness, no peripheral edema  Neuro: non focal, well oriented, positive affect  Breasts: deferred  LAB RESULTS:   Lab Results  Component Value Date   WBC 7.9 08/01/2014   NEUTROABS 4.6 08/01/2014   HGB 12.3 08/01/2014   HCT 38.1 08/01/2014   MCV 79.0* 08/01/2014   PLT 175 08/01/2014      Chemistry      Component Value Date/Time   NA 142 08/01/2014 1039   NA 138 01/09/2014 1522   K 3.8 08/01/2014 1039   K 4.2 01/09/2014 1522   CL 100 01/09/2014 1522   CO2 24 08/01/2014 1039   CO2 25 01/09/2014 1522   BUN 11.9 08/01/2014 1039   BUN 11 01/09/2014 1522   CREATININE 1.0 08/01/2014 1039   CREATININE 0.99 01/09/2014 1522      Component Value Date/Time   CALCIUM 9.4 08/01/2014 1039   CALCIUM 8.5 01/09/2014 1522   ALKPHOS 112 08/01/2014 1039    ALKPHOS 102 12/09/2013 1400   AST 13 08/01/2014 1039   AST 36 12/09/2013 1400   ALT 10 08/01/2014 1039   ALT 30 12/09/2013 1400   BILITOT 0.35 08/01/2014 1039   BILITOT 0.2* 12/09/2013 1400     STUDIES: Echocardiogram performed on 05/08/14 showed a decrease in LVEF at 45-50%, a baseline of  55-60% was noted from her scan in March 2015  Repeat echocardiogram performed on 06/08/14 showed an increase in LVEF to 50-55%   ASSESSMENT: 63 y.o. Angela Gross   (1)  s/p biopsy of separate Right breast masses 10/11/2013 for a clinical mT1 N0, stage IA invasive ductal carcinoma, grade II-III, one of the mass is being 85% estrogen receptor positive, progesterone receptor negative, with an MIB-1 of 17% and HER-2 amplified.  (2) status post right mastectomy and sentinel lymph node sampling with immediate expander placement 12/13/2013 for a pT1c pN0 invasive ductal carcinoma, grade 3, again HER-2 positive.  (3) Being treated in the adjuvant setting with carboplatin, docetaxel, trastuzumab and pertuzumab, first dose on 02/21/2014. Neulasta on day 2 for granulocyte support. The plan is to repeat these agents every 21 days x6, after which the anti-HER-2 treatment (both trastuzumab and pertuzumab) will be continued to the total one year. Patient developed significant peripheral neuropathy after one cycle of chemotherapy, and both carboplatin and docetaxel were held beginning with cycle 2.  (4)  For cycles 2 through 6, patient receiving only q. three-week trastuzumab/pertuzumab.  After a total of 6 cycles of trastuzumab/pertuzumab the pertuzumab will be discontinued, and single agent trastuzumab will be continued every 3 weeks for total of one year (until May 2016)  (5)  The patient started on anastrozole on 04/04/2014 after discontinuing chemotherapy with docetaxel/carboplatin. The plan is to continue for total of 5 years, until June 2020.   (6) The patient will not need postmastectomy radiation  (7)  Rheumatoid arthritis, usually on Remicade, prednisone and methotrexate, held prior to mastectomy.  (8)  Hx of cellulitis affecting the right reconstructed breast diagnosed at 01/09/2014 with subsequent surgery to drain the seroma and exchange tissue expander - Resolved  (9) chemotherapy-induced peripheral neuropathy  - to be treated with gabapentin, 300 BID, and tramadol, 50 mg every 6 hours as needed.     PLAN: Angela Gross is doing well today. The labs were reviewed in detail and were entirely stable. We will proceed with her 6th and final cycle of the trastuzumab and pertuzumab today. After this we will continue single agent trastuzumab through May 2016, and of course the anastrozole daily. She is retired and stays at home most days, so I am increasing her gabapentin to 336m BID, with the warning that it does cause drowsiness and she should not drive after dosing. I also refilled her tramadol as well.   Angela Gross's repeat echocardiogram is scheduled for 08/24/14, but I am attempting to have that scheduled a week earlier, as she returns on 08/22/14 for her next office visit and infusion of trastuzumab. If there is a drop in ejection fraction again, we may consider starting lapatinib instead. She understands and agrees with this plan. She knows the goal of treatment in her case is cure. She has been encouraged to call with any issues that might arise before her next visit here.    FMarcelino Duster NP  08/01/2014 3:19 PM

## 2014-08-22 ENCOUNTER — Other Ambulatory Visit (HOSPITAL_BASED_OUTPATIENT_CLINIC_OR_DEPARTMENT_OTHER): Payer: Medicare Other

## 2014-08-22 ENCOUNTER — Ambulatory Visit (HOSPITAL_BASED_OUTPATIENT_CLINIC_OR_DEPARTMENT_OTHER): Payer: Medicare Other

## 2014-08-22 ENCOUNTER — Ambulatory Visit: Payer: Medicare Other

## 2014-08-22 ENCOUNTER — Ambulatory Visit (HOSPITAL_BASED_OUTPATIENT_CLINIC_OR_DEPARTMENT_OTHER): Payer: Medicare Other | Admitting: Oncology

## 2014-08-22 VITALS — BP 145/95 | HR 91 | Temp 98.5°F | Resp 18 | Ht 60.0 in | Wt 151.3 lb

## 2014-08-22 DIAGNOSIS — M069 Rheumatoid arthritis, unspecified: Secondary | ICD-10-CM

## 2014-08-22 DIAGNOSIS — M2550 Pain in unspecified joint: Secondary | ICD-10-CM

## 2014-08-22 DIAGNOSIS — C50811 Malignant neoplasm of overlapping sites of right female breast: Secondary | ICD-10-CM

## 2014-08-22 DIAGNOSIS — C50311 Malignant neoplasm of lower-inner quadrant of right female breast: Secondary | ICD-10-CM

## 2014-08-22 DIAGNOSIS — Z95828 Presence of other vascular implants and grafts: Secondary | ICD-10-CM

## 2014-08-22 LAB — COMPREHENSIVE METABOLIC PANEL (CC13)
ALK PHOS: 117 U/L (ref 40–150)
ALT: 11 U/L (ref 0–55)
AST: 16 U/L (ref 5–34)
Albumin: 3.5 g/dL (ref 3.5–5.0)
Anion Gap: 8 mEq/L (ref 3–11)
BUN: 9.7 mg/dL (ref 7.0–26.0)
CO2: 27 mEq/L (ref 22–29)
Calcium: 9.3 mg/dL (ref 8.4–10.4)
Chloride: 109 mEq/L (ref 98–109)
Creatinine: 0.9 mg/dL (ref 0.6–1.1)
Glucose: 96 mg/dl (ref 70–140)
Potassium: 3.9 mEq/L (ref 3.5–5.1)
Sodium: 143 mEq/L (ref 136–145)
Total Bilirubin: 0.26 mg/dL (ref 0.20–1.20)
Total Protein: 7.3 g/dL (ref 6.4–8.3)

## 2014-08-22 LAB — CBC WITH DIFFERENTIAL/PLATELET
BASO%: 0.1 % (ref 0.0–2.0)
Basophils Absolute: 0 10*3/uL (ref 0.0–0.1)
EOS%: 1.3 % (ref 0.0–7.0)
Eosinophils Absolute: 0.1 10*3/uL (ref 0.0–0.5)
HCT: 37.2 % (ref 34.8–46.6)
HGB: 12.1 g/dL (ref 11.6–15.9)
LYMPH%: 25.6 % (ref 14.0–49.7)
MCH: 25.6 pg (ref 25.1–34.0)
MCHC: 32.5 g/dL (ref 31.5–36.0)
MCV: 78.8 fL — AB (ref 79.5–101.0)
MONO#: 0.7 10*3/uL (ref 0.1–0.9)
MONO%: 7.6 % (ref 0.0–14.0)
NEUT#: 6.1 10*3/uL (ref 1.5–6.5)
NEUT%: 65.4 % (ref 38.4–76.8)
Platelets: 173 10*3/uL (ref 145–400)
RBC: 4.72 10*6/uL (ref 3.70–5.45)
RDW: 15.1 % — ABNORMAL HIGH (ref 11.2–14.5)
WBC: 9.3 10*3/uL (ref 3.9–10.3)
lymph#: 2.4 10*3/uL (ref 0.9–3.3)

## 2014-08-22 LAB — MAGNESIUM (CC13): MAGNESIUM: 2.1 mg/dL (ref 1.5–2.5)

## 2014-08-22 MED ORDER — SODIUM CHLORIDE 0.9 % IJ SOLN
10.0000 mL | INTRAMUSCULAR | Status: DC | PRN
  Administered 2014-08-22: 10 mL via INTRAVENOUS
  Filled 2014-08-22: qty 10

## 2014-08-22 NOTE — Progress Notes (Signed)
[place]  [phone] [phone]) [phone]     ID: Angela Gross  MR#: [id number]  ZHY#:[id number]  PCP: Salena Saner., MD GYN:   SUFanny Skates OTHER MD: [name], Lyndee Leo Sanger, Arnoldo Hooker Thimmappa  CHIEF COMPLAINT:  Right Breast Cancer, triple positive  CURRENT TREATMENT:  Trastuzumab/, anastrozole  HISTORY OF PRESENT ILLNESS: From the original intake note:  Angela Gross had routine screening mammography at the breast center 09/21/2013 showing a possible mass in calcifications in the right breast. On 10/11/2013 she underwent right diagnostic mammography and ultrasonography. This confirmed a spiculated 1.2 cm mass in the central right breast, with a group of pleomorphic calcifications slightly laterally. The calcifications spanning 1.6 cm. A second group of calcifications extended inferiorly and spanned 8 mm. The entire area in aggregate measured 6.5 cm. By physical exam there was no palpable abnormality. Ultrasonography showed an irregular hypoechoic mass at the 6:00 position of the right breast measuring 1 cm maximally. The right axilla was normal.  Biopsy of the right breast mass in question as well as the more lateral area of calcifications on 10/11/2013 showed ([id number]) most to be positive for an invasive ductal carcinoma, both estrogen receptor 85-90% positive with strong staining intensity, both progesterone receptor negative, with an MIB-1 of 17%. HER-2 was amplified, with a signals ratio of 3.44 and a copy number per cell 04 0.30.  Bilateral breast MRI 10/19/2013 showed again a spiculated mass in the 6:00 region of the right breast measuring 1.2 cm, a slightly more lateral hematoma associated with a second biopsy and also with clumped enhancement, and asymmetric none masslike enhancement in most of the right breast, the entire area of abnormality measuring 9.0 cm. The left breast was unremarkable and there were no abnormal  appearing lymph nodes.  Her subsequent history is as detailed below   INTERVAL HISTORY: [name] returns for follow up of her breast cancer.Today is day 1 cycle 1 of her trastuzumab alone treatments. She completed her sixth and final cycle of trastuzumab/pertuzumab 08/01/2014  REVIEW OF SYSTEMS: [name] tells me she has been feeling much better since we stopped the chemotherapy. However she still has cramps in her legs at night and she complains of neuropathy in her fingertips and toetips. They tingle and HER-2 sometimes. She feels like she is walking on rocks. Otherwise she gets short of breath if she "moves to fast". There has been no cough, pleurisy, hemoptysis, or purulence. There have been no intercurrent fevers. She has some joint pain and arthritis which is not more intense or persistent than before. She feels anxious but not depressed. She tells me her sugars are on "pretty good control". A detailed review of systems today was otherwise noncontributory  PAST MEDICAL HISTORY: Past Medical History  Diagnosis Date  . Raynaud disease   . Osteoporosis   . Hypertension   . PONV (postoperative nausea and vomiting)   . Wears glasses   . Full dentures   . Fibromyalgia   . Rheumatoid arthritis     "elbows; fingers; toes"  . Osteoarthritis of both knees   . Degenerative disc disease, cervical   . Chronic left shoulder pain     "work related injury" (01/10/2014)  . Breast cancer     "right" (01/10/2014)  . Type II diabetes mellitus     has not taken meds in over a month (01/10/2014)  . Syncope and collapse     "4 times in the last year" (01/10/2014)  PAST SURGICAL HISTORY: Past Surgical History  Procedure Laterality Date  . Colonoscopy    . Mastectomy w/ sentinel node biopsy Right 12/13/2013    Procedure: RIGHT TOTAL MASTECTOMY WITH SENTINEL LYMPH NODE BIOPSY;  Surgeon: Adin Hector, MD;  Location: [facility];  Service: General;  Laterality: Right;  . Portacath  placement Right 12/13/2013    Procedure: INSERTION PORT-A-CATH;  Surgeon: Adin Hector, MD;  Location: [place];  Service: General;  Laterality: Right;  . Breast reconstruction with placement of tissue expander and flex hd (acellular hydrated dermis) Right 12/13/2013    Procedure: RIGHT BREAST RECONSTRUCTION WITH PLACEMENT OF TISSUE EXPANDER AND FLEX HD (ACELLULAR HYDRATED DERMIS);  Surgeon: Irene Limbo, MD;  Location: [facility];  Service: Plastics;  Laterality: Right;  . Lipoma excision Bilateral 12/13/2013    Procedure: EXCISION OF LEFT BREAST KELOID AND RIGHT CHEST KELOID;  Surgeon: Irene Limbo, MD;  Location: [facility];  Service: Plastics;  Laterality: Bilateral;  . Mastectomy    . Cholecystectomy  1970's  . Vaginal hysterectomy  1980    no salpingo-oophoretomu  . Breast biopsy Right 10/2013  . Tissue expander placement Right 01/10/2014    Procedure: Incision and Drainage Right Breast Seroma with TISSUE EXPANDER exchange;  Surgeon: Irene Limbo, MD;  Location: [facility];  Service: Plastics;  Laterality: Right;    FAMILY HISTORY No family history on file. The patient is little information about her father. Her mother died from a heart attack at the age of 9. She had been diagnosed with breast cancer in her late 36s. The patient had 4 brothers and 4 sisters. There is no other history of breast or ovarian cancer in the family to her knowledge.  GYNECOLOGIC HISTORY:    (Reviewed 04/04/2014) She does not recall her age of menarche. She underwent hysterectomy in her 25s. She did not receive hormone replacement. First live birth at 63. She was GX P2.  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 She is currently disabled secondary to her rheumatoid arthritis. Her husband May Manrique is a retired Administrator. He now works part time at [employer]. Son Roderic Scarce "Venora Maples"  Ziglar works in [place] as a Patent attorney. Daughter [name] is a Social worker.    ADVANCED DIRECTIVES: Not in place   HEALTH MAINTENANCE: (Updated 04/04/2014) History  Substance Use Topics  . Smoking status: Never Smoker   . Smokeless tobacco: Never Used  . Alcohol Use: No     Colonoscopy: Not on file  PAP: Status post remote hysterectomy  Bone density: At [facility] 01/05/2009 showed osteopenia with a T score of -1.7  Lipid panel: Not on file   Allergies  Allergen Reactions  . Codeine Other (See Comments) and Hives    Altered mental status, numbness  Altered mental status, Numbess  . Diazepam     Other reaction(s): Delusions (intolerance) hallucinations     Current Outpatient Prescriptions  Medication Sig Dispense Refill  . anastrozole (ARIMIDEX) 1 MG tablet Take 1 tablet (1 mg total) by mouth daily. 30 tablet 5  . carvedilol (COREG) 6.25 MG tablet Take 1.5 tablets (9.375 mg total) by mouth 2 (two) times daily with a meal. 90 tablet 6  . cyclobenzaprine (FLEXERIL) 10 MG tablet Take 1 tablet (10 mg total) by mouth 3 (three) times daily as needed for muscle spasms. 30 tablet 1  .  gabapentin (NEURONTIN) 300 MG capsule Take 1 capsule (300 mg total) by mouth 2 (two) times daily. For neuropathy and hot flashes 60 capsule 5  . lidocaine-prilocaine (EMLA) cream Apply 1 application topically as needed. 1-2 hrs before port access 30 g 4  . lisinopril (PRINIVIL,ZESTRIL) 10 MG tablet Take 1 tablet (10 mg total) by mouth daily. 90 tablet 3  . potassium chloride (K-DUR) 10 MEQ tablet Take 1 tablet (10 mEq total) by mouth 2 (two) times daily. 10 tablet 0  . traMADol (ULTRAM) 50 MG tablet Take 1 tablet (50 mg total) by mouth 3 (three) times daily as needed (pain). 90 tablet 1  . Vitamin D, Ergocalciferol, (DRISDOL) 50000 UNITS CAPS capsule Take 50,000 Units by mouth every 7 (seven) days. Wednesdays     No current facility-administered medications for  this visit.   Facility-Administered Medications Ordered in Other Visits  Medication Dose Route Frequency Provider Last Rate Last Dose  . sodium chloride 0.9 % injection 10 mL  10 mL Intravenous PRN Amy G Berry, PA-C   10 mL at 03/01/14 1425  . sodium chloride 0.9 % injection 10 mL  10 mL Intravenous PRN Chauncey Cruel, MD   10 mL at 03/01/14 4259    OBJECTIVE:  Middle-aged African-American woman who appears stated age 63 Vitals:   08/22/14 1417  BP: 145/95  Pulse: 91  Temp: 98.5 F (36.9 C)  Resp: 18     Body mass index is 29.55 kg/(m^2).    ECOG FS: 1  Filed Weights   08/22/14 1417  Weight: 151 lb 4.8 oz (68.629 kg)   Sclerae unicteric, EOMs intact Oropharynx clear and moist No cervical or supraclavicular adenopathy Lungs no rales or rhonchi Heart regular rate and rhythm Abd soft, nontender, positive bowel sounds MSK no focal spinal tenderness, no upper extremity lymphedema Neuro: nonfocal, well oriented, guarded affect Breasts: the right breast is status post mastectomyand reconstruction. There is no evidence of local recurrence. The right axilla is benign.The left breast is unremarkable.   LAB RESULTS:   Lab Results  Component Value Date   WBC 9.3 08/22/2014   NEUTROABS 6.1 08/22/2014   HGB 12.1 08/22/2014   HCT 37.2 08/22/2014   MCV 78.8* 08/22/2014   PLT 173 08/22/2014      Chemistry      Component Value Date/Time   NA 143 08/22/2014 1344   NA 138 01/09/2014 1522   K 3.9 08/22/2014 1344   K 4.2 01/09/2014 1522   CL 100 01/09/2014 1522   CO2 27 08/22/2014 1344   CO2 25 01/09/2014 1522   BUN 9.7 08/22/2014 1344   BUN 11 01/09/2014 1522   CREATININE 0.9 08/22/2014 1344   CREATININE 0.99 01/09/2014 1522      Component Value Date/Time   CALCIUM 9.3 08/22/2014 1344   CALCIUM 8.5 01/09/2014 1522   ALKPHOS 117 08/22/2014 1344   ALKPHOS 102 12/09/2013 1400   AST 16 08/22/2014 1344   AST 36 12/09/2013 1400   ALT 11 08/22/2014 1344   ALT 30  12/09/2013 1400   BILITOT 0.26 08/22/2014 1344   BILITOT 0.2* 12/09/2013 1400     STUDIES: Echocardiogram performed on 05/08/14 showed a decrease in LVEF at 45-50%, a baseline of 55-60% was noted from her scan in March 2015; repeat echocardiogram performed on 06/08/14 showed an increase in LVEF to 50-55%, with the same results on 07/10/2014 echo   ASSESSMENT: 63 y.o. [place] woman   (1)  s/p biopsy of separate Right  breast masses 10/11/2013 for a clinical mT1 N0, stage IA invasive ductal carcinoma, grade II-III, one of the mass is being 85% estrogen receptor positive, progesterone receptor negative, with an MIB-1 of 17% and HER-2 amplified.  (2) status post right mastectomy and sentinel lymph node sampling with immediate expander placement 12/13/2013 for a pT1c pN0, stage IA invasive ductal carcinoma, grade 3, again HER-2 positive.  (3) treated in the adjuvant setting with carboplatin, docetaxel, trastuzumab and pertuzumab, first dose on 02/21/2014   (a). Patient developed significant peripheral neuropathy after one cycle of chemotherapy, and both carboplatin and docetaxel were held beginning with cycle 2. Continued pertuzumab/ trastuzumab x 5 more cycles, completed 08/01/2014  (b)  single agent trastuzumab to be started 08/29/2014, to be continued every 3 weeks until May 2016  (d) most recent echocardiogram 07/10/2014 showed an ejection fraction in the 50-55% range.  (4) started on anastrozole on 04/04/2014 .   (5) Rheumatoid arthritis,  on Remicade, prednisone and methotrexate  (6) chemotherapy-induced peripheral neuropathy  - being treated with gabapentin, 300 BID, and tramadol, 50 mg every 6 hours as needed.     PLAN: Jamyiah is feeling better overall, but her episodes of shortness of breath with activity are troubling and I think it would be best to hold her Herceptin until she has a repeat echocardiogram 2 days from now.  Tentatively then I am scheduling her for Herceptin a week  from today. Since her next dose would be only 2 weeks later I am adjusting the next week Herceptin dose accordingly.  Her neuropathy is perhaps minimally better. We are continuing with gabapentin and tramadol as before.  Otherwise the plan is to continue on anastrozole for a total of 5 years. Nekita has a good understanding of the overall plan. She agrees with it. She knows the goal of treatment in her case is cure. She will call with any problems that may develop before her next visit here.  Chauncey Cruel, MD  08/22/2014 2:40 PM

## 2014-08-24 ENCOUNTER — Ambulatory Visit (HOSPITAL_COMMUNITY)
Admission: RE | Admit: 2014-08-24 | Discharge: 2014-08-24 | Disposition: A | Discharge status: Home / Self Care | Payer: Medicare Other | Source: Ambulatory Visit | Attending: Cardiology | Admitting: Cardiology

## 2014-08-24 ENCOUNTER — Ambulatory Visit (HOSPITAL_BASED_OUTPATIENT_CLINIC_OR_DEPARTMENT_OTHER)
Admission: RE | Admit: 2014-08-24 | Discharge: 2014-08-24 | Disposition: A | Discharge status: Home / Self Care | Payer: Medicare Other | Source: Ambulatory Visit | Attending: Cardiology | Admitting: Cardiology

## 2014-08-24 ENCOUNTER — Encounter (HOSPITAL_COMMUNITY): Payer: Self-pay

## 2014-08-24 DIAGNOSIS — M1990 Unspecified osteoarthritis, unspecified site: Secondary | ICD-10-CM | POA: Diagnosis not present

## 2014-08-24 DIAGNOSIS — C50911 Malignant neoplasm of unspecified site of right female breast: Secondary | ICD-10-CM | POA: Insufficient documentation

## 2014-08-24 DIAGNOSIS — Z9011 Acquired absence of right breast and nipple: Secondary | ICD-10-CM | POA: Diagnosis not present

## 2014-08-24 DIAGNOSIS — I427 Cardiomyopathy due to drug and external agent: Secondary | ICD-10-CM | POA: Diagnosis not present

## 2014-08-24 DIAGNOSIS — I7300 Raynaud's syndrome without gangrene: Secondary | ICD-10-CM | POA: Insufficient documentation

## 2014-08-24 DIAGNOSIS — T451X5A Adverse effect of antineoplastic and immunosuppressive drugs, initial encounter: Secondary | ICD-10-CM

## 2014-08-24 DIAGNOSIS — R0600 Dyspnea, unspecified: Secondary | ICD-10-CM | POA: Diagnosis not present

## 2014-08-24 DIAGNOSIS — M069 Rheumatoid arthritis, unspecified: Secondary | ICD-10-CM | POA: Diagnosis not present

## 2014-08-24 DIAGNOSIS — C50311 Malignant neoplasm of lower-inner quadrant of right female breast: Secondary | ICD-10-CM

## 2014-08-24 DIAGNOSIS — Z79899 Other long term (current) drug therapy: Secondary | ICD-10-CM | POA: Diagnosis not present

## 2014-08-24 DIAGNOSIS — M5030 Other cervical disc degeneration, unspecified cervical region: Secondary | ICD-10-CM | POA: Diagnosis not present

## 2014-08-24 DIAGNOSIS — C50919 Malignant neoplasm of unspecified site of unspecified female breast: Secondary | ICD-10-CM

## 2014-08-24 DIAGNOSIS — Z170 Estrogen receptor positive status [ER+]: Secondary | ICD-10-CM | POA: Diagnosis not present

## 2014-08-24 MED ORDER — CARVEDILOL 12.5 MG PO TABS: 12.5000 mg | ORAL_TABLET | Freq: Two times a day (BID) | ORAL | Status: DC

## 2014-08-24 NOTE — Patient Instructions (Signed)
Increase Carvedilol to 12.5 mg Twice daily   Your physician recommends that you schedule a follow-up appointment in: 2 months with echocardiogram

## 2014-08-24 NOTE — Progress Notes (Signed)
Patient ID: Angela Gross, female   DOB: Oct 13, 1951, 63 y.o.   MRN: [id number] Primary oncologist: Dr. Jana Hakim  63 yo with history of breast cancer diagnosed in 12/14 s/p mastectomy presents to cardio-oncology clinic.  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  She has no significant cardiac history.  Her mother had CHF but we do not know the cause.    She was started on carboplatin, docetaxel, and Herceptin in 4/15 but only tolerated 2 cycles.  Then switched to Herceptin/Perjeta and Anastazole. Had 4 cycles and then Herceptin/Perjeta stopped due to cardiotoxicity.  After last appointment, I suggested that she could try resuming Herceptin.  This has been restarted.  Today, she reports feeling short of breath if she "moves fast."  She says this is not new but has been present for months.  She is not short of breath walking at a steady pace on flat ground.  No orthopnea, PND, or chest pain. Weight is down 3 lbs.   Labs (9/15): K 3.5, creatinine 1.1  PMH: 1. Breast cancer: Diagnosed in 12/14.  ER+/PR-/HER2neu+.  She is now s/p right mastectomy.  - Echo (3/15) with EF 55-60%, lateral s' 11 cm/sec (no strain) - ECHO 05/08/14 EF ~ 50% Lateral S' 9.2 GLS -15.9  - ECHO 06/08/14 EF 55% Lateral s' 7.2 cm/sec GLS - 17.3 - ECHO 9/15 EF 50-55%, Lateral s' 10, GLS -15 - ECHO 11/15 EF 00-86%, normal diastolic function, lateral s' 11.3, GLS -16.5%, normla RV size and systolic function.  2. Rheumatoid arthritis 3. Raynauds syndrome 4. OA 5. Degenerative disc disease: C-spine  SH: Lives with husband in [place], nonsmoker.   FH: Mother with CHF.   ROS: All systems reviewed and negative except as per HPI.   Current Outpatient Prescriptions  Medication Sig Dispense Refill  . anastrozole (ARIMIDEX) 1 MG tablet Take 1 tablet (1 mg total) by mouth daily. 30 tablet 5  . carvedilol (COREG) 12.5 MG tablet Take 1 tablet (12.5 mg total) by mouth 2 (two) times daily with a meal. 60 tablet 3  . cyclobenzaprine  (FLEXERIL) 10 MG tablet Take 1 tablet (10 mg total) by mouth 3 (three) times daily as needed for muscle spasms. 30 tablet 1  . gabapentin (NEURONTIN) 300 MG capsule Take 1 capsule (300 mg total) by mouth 2 (two) times daily. For neuropathy and hot flashes (Patient taking differently: Take 300 mg by mouth 3 (three) times daily as needed. For neuropathy and hot flashes) 60 capsule 5  . lidocaine-prilocaine (EMLA) cream Apply 1 application topically as needed. 1-2 hrs before port access 30 g 4  . lisinopril (PRINIVIL,ZESTRIL) 10 MG tablet Take 1 tablet (10 mg total) by mouth daily. 90 tablet 3  . potassium chloride (K-DUR) 10 MEQ tablet Take 1 tablet (10 mEq total) by mouth 2 (two) times daily. 10 tablet 0  . traMADol (ULTRAM) 50 MG tablet Take 1 tablet (50 mg total) by mouth 3 (three) times daily as needed (pain). 90 tablet 1  . Vitamin D, Ergocalciferol, (DRISDOL) 50000 UNITS CAPS capsule Take 50,000 Units by mouth every 7 (seven) days. Wednesdays     No current facility-administered medications for this encounter.   Facility-Administered Medications Ordered in Other Encounters  Medication Dose Route Frequency Provider Last Rate Last Dose  . sodium chloride 0.9 % injection 10 mL  10 mL Intravenous PRN Amy G Berry, PA-C   10 mL at 03/01/14 1425  . sodium chloride 0.9 % injection 10 mL  10 mL Intravenous PRN  Chauncey Cruel, MD   10 mL at 03/01/14 0934   BP 110/70 mmHg  Pulse 88  Wt 154 lb (69.854 kg)  SpO2 99% General: NAD.  Neck: No JVD, no thyromegaly or thyroid nodule.  Lungs: Clear to auscultation bilaterally with normal respiratory effort. CV: Nondisplaced PMI.  Heart regular S1/S2, no S3/S4, no murmur.  No peripheral edema.  No carotid bruit.  Normal pedal pulses.  Abdomen: Soft, nontender, no hepatosplenomegaly, no distention.  Skin: Intact without lesions or rashes.  Neurologic: Alert and oriented x 3.  Psych: Normal affect. Extremities: No clubbing or cyanosis.  HEENT: Normal.    Assessment/Plan: 1. R Breast Cancer- mT1 N0, stage IA invasive ductal carcinoma. Herceptin stopped 7/15 due to drop in EF and restarted in 9/15. 2. Chemotherapy-induced cardiomyopathy: Herceptin-related.  I reviewed today's echo.  EF and lateral s' look improved compared to prior echo.  Global longitudinal strain is also more negative (improved) though strain images are difficult given poor endocardial definition.  Patient reports subjective dyspnea with "hurrying" but says this is not new.  She is not volume overloaded on exam.  - I think that she can continue therapy with Herceptin, I will just plan to get an echo and followup in 2 months (keep echo interval a little closer together for now).  - If she has recurrent cardiotoxicity with herceptin, Lapatanib may be an option.  - I will increase Coreg to 12.5 mg bid and continue lisinopril.   Loralie Champagne 08/24/2014

## 2014-08-24 NOTE — Progress Notes (Signed)
Echo Lab  2D Echocardiogram completed.  [place], RDCS 08/24/2014 9:49 AM

## 2014-08-24 NOTE — Addendum Note (Signed)
Addended by: Laureen Abrahams on: 08/24/2014 05:46 PM   Modules accepted: Medications

## 2014-08-28 ENCOUNTER — Other Ambulatory Visit: Payer: Self-pay | Admitting: Nurse Practitioner

## 2014-09-05 ENCOUNTER — Other Ambulatory Visit: Payer: Self-pay | Admitting: *Deleted

## 2014-09-05 DIAGNOSIS — M2550 Pain in unspecified joint: Secondary | ICD-10-CM

## 2014-09-05 MED ORDER — CYCLOBENZAPRINE HCL 10 MG PO TABS: 10.0000 mg | ORAL_TABLET | Freq: Three times a day (TID) | ORAL | Status: DC | PRN

## 2014-09-12 ENCOUNTER — Ambulatory Visit: Payer: Medicare Other

## 2014-09-12 ENCOUNTER — Telehealth: Payer: Self-pay | Admitting: *Deleted

## 2014-09-12 ENCOUNTER — Ambulatory Visit (HOSPITAL_BASED_OUTPATIENT_CLINIC_OR_DEPARTMENT_OTHER): Payer: Medicare Other | Admitting: Nurse Practitioner

## 2014-09-12 ENCOUNTER — Encounter: Payer: Self-pay | Admitting: Nurse Practitioner

## 2014-09-12 ENCOUNTER — Ambulatory Visit (HOSPITAL_BASED_OUTPATIENT_CLINIC_OR_DEPARTMENT_OTHER): Payer: Medicare Other

## 2014-09-12 ENCOUNTER — Other Ambulatory Visit (HOSPITAL_BASED_OUTPATIENT_CLINIC_OR_DEPARTMENT_OTHER): Payer: Medicare Other

## 2014-09-12 ENCOUNTER — Telehealth: Payer: Self-pay | Admitting: Nurse Practitioner

## 2014-09-12 VITALS — BP 138/88 | HR 82 | Temp 97.7°F | Resp 19 | Ht 60.0 in | Wt 151.7 lb

## 2014-09-12 DIAGNOSIS — C50311 Malignant neoplasm of lower-inner quadrant of right female breast: Secondary | ICD-10-CM

## 2014-09-12 DIAGNOSIS — C50811 Malignant neoplasm of overlapping sites of right female breast: Secondary | ICD-10-CM

## 2014-09-12 DIAGNOSIS — Z5112 Encounter for antineoplastic immunotherapy: Secondary | ICD-10-CM

## 2014-09-12 DIAGNOSIS — G620 Drug-induced polyneuropathy: Secondary | ICD-10-CM

## 2014-09-12 DIAGNOSIS — Z95828 Presence of other vascular implants and grafts: Secondary | ICD-10-CM

## 2014-09-12 DIAGNOSIS — M2550 Pain in unspecified joint: Secondary | ICD-10-CM

## 2014-09-12 DIAGNOSIS — T451X5A Adverse effect of antineoplastic and immunosuppressive drugs, initial encounter: Secondary | ICD-10-CM

## 2014-09-12 DIAGNOSIS — M069 Rheumatoid arthritis, unspecified: Secondary | ICD-10-CM

## 2014-09-12 LAB — COMPREHENSIVE METABOLIC PANEL (CC13)
ALT: 9 U/L (ref 0–55)
ANION GAP: 8 meq/L (ref 3–11)
AST: 16 U/L (ref 5–34)
Albumin: 3.5 g/dL (ref 3.5–5.0)
Alkaline Phosphatase: 116 U/L (ref 40–150)
BUN: 9.2 mg/dL (ref 7.0–26.0)
CO2: 25 mEq/L (ref 22–29)
CREATININE: 0.9 mg/dL (ref 0.6–1.1)
Calcium: 9.5 mg/dL (ref 8.4–10.4)
Chloride: 107 mEq/L (ref 98–109)
Glucose: 102 mg/dl (ref 70–140)
Potassium: 4.1 mEq/L (ref 3.5–5.1)
Sodium: 141 mEq/L (ref 136–145)
Total Bilirubin: 0.32 mg/dL (ref 0.20–1.20)
Total Protein: 7.5 g/dL (ref 6.4–8.3)

## 2014-09-12 LAB — CBC WITH DIFFERENTIAL/PLATELET
BASO%: 0.1 % (ref 0.0–2.0)
BASOS ABS: 0 10*3/uL (ref 0.0–0.1)
EOS%: 1.8 % (ref 0.0–7.0)
Eosinophils Absolute: 0.1 10*3/uL (ref 0.0–0.5)
HCT: 38.9 % (ref 34.8–46.6)
HGB: 12.3 g/dL (ref 11.6–15.9)
LYMPH%: 29.5 % (ref 14.0–49.7)
MCH: 25.4 pg (ref 25.1–34.0)
MCHC: 31.6 g/dL (ref 31.5–36.0)
MCV: 80.4 fL (ref 79.5–101.0)
MONO#: 0.6 10*3/uL (ref 0.1–0.9)
MONO%: 8.2 % (ref 0.0–14.0)
NEUT#: 4.4 10*3/uL (ref 1.5–6.5)
NEUT%: 60.4 % (ref 38.4–76.8)
PLATELETS: 180 10*3/uL (ref 145–400)
RBC: 4.84 10*6/uL (ref 3.70–5.45)
RDW: 14.8 % — ABNORMAL HIGH (ref 11.2–14.5)
WBC: 7.2 10*3/uL (ref 3.9–10.3)
lymph#: 2.1 10*3/uL (ref 0.9–3.3)

## 2014-09-12 LAB — MAGNESIUM (CC13): Magnesium: 2.4 mg/dl (ref 1.5–2.5)

## 2014-09-12 MED ORDER — SODIUM CHLORIDE 0.9 % IJ SOLN
10.0000 mL | INTRAMUSCULAR | Status: DC | PRN
  Administered 2014-09-12: 10 mL via INTRAVENOUS
  Filled 2014-09-12: qty 10

## 2014-09-12 MED ORDER — ACETAMINOPHEN 325 MG PO TABS
650.0000 mg | ORAL_TABLET | Freq: Once | ORAL | Status: AC
  Administered 2014-09-12: 650 mg via ORAL

## 2014-09-12 MED ORDER — DIPHENHYDRAMINE HCL 25 MG PO CAPS
ORAL_CAPSULE | ORAL | Status: AC
  Filled 2014-09-12: qty 1

## 2014-09-12 MED ORDER — DIPHENHYDRAMINE HCL 25 MG PO CAPS
25.0000 mg | ORAL_CAPSULE | Freq: Once | ORAL | Status: AC
  Administered 2014-09-12: 25 mg via ORAL

## 2014-09-12 MED ORDER — SODIUM CHLORIDE 0.9 % IJ SOLN
10.0000 mL | INTRAMUSCULAR | Status: DC | PRN
  Administered 2014-09-12: 10 mL
  Filled 2014-09-12: qty 10

## 2014-09-12 MED ORDER — TRASTUZUMAB CHEMO INJECTION 440 MG
6.0000 mg/kg | Freq: Once | INTRAVENOUS | Status: AC
  Administered 2014-09-12: 420 mg via INTRAVENOUS
  Filled 2014-09-12: qty 20

## 2014-09-12 MED ORDER — SODIUM CHLORIDE 0.9 % IV SOLN
Freq: Once | INTRAVENOUS | Status: AC
  Administered 2014-09-12: 11:00:00 via INTRAVENOUS

## 2014-09-12 MED ORDER — HEPARIN SOD (PORK) LOCK FLUSH 100 UNIT/ML IV SOLN
500.0000 [IU] | Freq: Once | INTRAVENOUS | Status: AC | PRN
  Administered 2014-09-12: 500 [IU]
  Filled 2014-09-12: qty 5

## 2014-09-12 MED ORDER — ACETAMINOPHEN 325 MG PO TABS
ORAL_TABLET | ORAL | Status: AC
  Filled 2014-09-12: qty 2

## 2014-09-12 NOTE — Progress Notes (Signed)
[place]  Telephone:(336) [phone] Fax:(336) [phone]     ID: [name] OB: 08-03-1951  MR#: [id number]  VHQ#:[id number]  PCP: No primary care provider on file. GYN:   SU: Fanny Skates OTHER MD: [name], Claire Sanger, Arnoldo Hooker Thimmappa  CHIEF COMPLAINT:  Right Breast Cancer, triple positive  CURRENT TREATMENT:  Trastuzumab/, anastrozole  HISTORY OF PRESENT ILLNESS: From the original intake note:  Maria had routine screening mammography at the breast center 09/21/2013 showing a possible mass in calcifications in the right breast. On 10/11/2013 she underwent right diagnostic mammography and ultrasonography. This confirmed a spiculated 1.2 cm mass in the central right breast, with a group of pleomorphic calcifications slightly laterally. The calcifications spanning 1.6 cm. A second group of calcifications extended inferiorly and spanned 8 mm. The entire area in aggregate measured 6.5 cm. By physical exam there was no palpable abnormality. Ultrasonography showed an irregular hypoechoic mass at the 6:00 position of the right breast measuring 1 cm maximally. The right axilla was normal.  Biopsy of the right breast mass in question as well as the more lateral area of calcifications on 10/11/2013 showed ([id number]) most to be positive for an invasive ductal carcinoma, both estrogen receptor 85-90% positive with strong staining intensity, both progesterone receptor negative, with an MIB-1 of 17%. HER-2 was amplified, with a signals ratio of 3.44 and a copy number per cell 04 0.30.  Bilateral breast MRI 10/19/2013 showed again a spiculated mass in the 6:00 region of the right breast measuring 1.2 cm, a slightly more lateral hematoma associated with a second biopsy and also with clumped enhancement, and asymmetric none masslike enhancement in most of the right breast, the entire area of abnormality measuring 9.0 cm. The left breast was unremarkable and there were no  abnormal appearing lymph nodes.  Her subsequent history is as detailed below   INTERVAL HISTORY: [name] returns for follow up of her breast cancer. Today is day 1 cycle 1 of her trastuzumab-alone treatments. She also continues on anastrozole daily. Her trastuzumab was held 3 weeks ago because of shortness of breath with exertion. She was evaluated by Dr. Aundra Dubin on 08/24/14 who suggested we continue on with treatments as her EF was actually improved.   REVIEW OF SYSTEMS: Markala denies fevers, chills, nausea, or vomiting. There are no real changes to her bowel habits, sometimes she is constipated, other times she has loose stools. This is primarily related to diet. She continues on the tramadol for her arthritic pain. She is taking 342m gabapentin QHS for her hot flashes and the neuropathy in her feet. Some times she has numbness to her fingertips. She denies vaginal changes. LKeoshais occasionally short of breath with exertion, but denies chest pain, palpitations, cough, or fatigue. A detailed review of systems is otherwise noncontributory.   PAST MEDICAL HISTORY: Past Medical History  Diagnosis Date  . Raynaud disease   . Osteoporosis   . Hypertension   . PONV (postoperative nausea and vomiting)   . Wears glasses   . Full dentures   . Fibromyalgia   . Rheumatoid arthritis     "elbows; fingers; toes"  . Osteoarthritis of both knees   . Degenerative disc disease, cervical   . Chronic left shoulder pain     "work related injury" (01/10/2014)  . Breast cancer     "right" (01/10/2014)  . Type II diabetes mellitus     has not taken meds in over a month (01/10/2014)  . Syncope and  collapse     "4 times in the last year" (01/10/2014)    PAST SURGICAL HISTORY: Past Surgical History  Procedure Laterality Date  . Colonoscopy    . Mastectomy w/ sentinel node biopsy Right 12/13/2013    Procedure: RIGHT TOTAL MASTECTOMY WITH SENTINEL LYMPH NODE BIOPSY;  Surgeon: Adin Hector, MD;   Location: [place] [facility];  Service: General;  Laterality: Right;  . Portacath placement Right 12/13/2013    Procedure: INSERTION PORT-A-CATH;  Surgeon: Adin Hector, MD;  Location: [place];  Service: General;  Laterality: Right;  . Breast reconstruction with placement of tissue expander and flex hd (acellular hydrated dermis) Right 12/13/2013    Procedure: RIGHT BREAST RECONSTRUCTION WITH PLACEMENT OF TISSUE EXPANDER AND FLEX HD (ACELLULAR HYDRATED DERMIS);  Surgeon: Irene Limbo, MD;  Location: [place];  Service: Plastics;  Laterality: Right;  . Lipoma excision Bilateral 12/13/2013    Procedure: EXCISION OF LEFT BREAST KELOID AND RIGHT CHEST KELOID;  Surgeon: Irene Limbo, MD;  Location: [place];  Service: Plastics;  Laterality: Bilateral;  . Mastectomy    . Cholecystectomy  1970's  . Vaginal hysterectomy  1980    no salpingo-oophoretomu  . Breast biopsy Right 10/2013  . Tissue expander placement Right 01/10/2014    Procedure: Incision and Drainage Right Breast Seroma with TISSUE EXPANDER exchange;  Surgeon: Irene Limbo, MD;  Location: [facility];  Service: Plastics;  Laterality: Right;    FAMILY HISTORY No family history on file. The patient is little information about her father. Her mother died from a heart attack at the age of 34. She had been diagnosed with breast cancer in her late 10s. The patient had 4 brothers and 4 sisters. There is no other history of breast or ovarian cancer in the family to her knowledge.  GYNECOLOGIC HISTORY:    (Reviewed 04/04/2014) She does not recall her age of menarche. She underwent hysterectomy in her 39s. She did not receive hormone replacement. First live birth at 65. She was GX P2.  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 She is currently disabled secondary to her rheumatoid arthritis. Her husband Edina Winningham is a retired Administrator. He now works part time at [employer]. Son [name] works in [place] as a Patent attorney. Daughter [name] is a Social worker.    ADVANCED DIRECTIVES: Not in place   HEALTH MAINTENANCE: (Updated 04/04/2014) History  Substance Use Topics  . Smoking status: Never Smoker   . Smokeless tobacco: Never Used  . Alcohol Use: No     Colonoscopy: Not on file  PAP: Status post remote hysterectomy  Bone density: At [facility] hospital 01/05/2009 showed osteopenia with a T score of -1.7  Lipid panel: Not on file   Allergies  Allergen Reactions  . Codeine Other (See Comments) and Hives    Altered mental status, numbness  Altered mental status, Numbess  . Diazepam     Other reaction(s): Delusions (intolerance) hallucinations     Current Outpatient Prescriptions  Medication Sig Dispense Refill  . anastrozole (ARIMIDEX) 1 MG tablet Take 1 tablet (1 mg total) by mouth daily. 30 tablet 5  . carvedilol (COREG) 12.5 MG tablet Take 1 tablet (12.5 mg total) by mouth 2 (two) times daily with a meal. 60 tablet 3  . cyclobenzaprine (FLEXERIL) 10 MG tablet Take 1 tablet (10 mg total)  by mouth 3 (three) times daily as needed for muscle spasms. 30 tablet 1  . gabapentin (NEURONTIN) 300 MG capsule Take 1 capsule (300 mg total) by mouth 2 (two) times daily. For neuropathy and hot flashes (Patient taking differently: Take 300 mg by mouth 3 (three) times daily as needed. For neuropathy and hot flashes) 60 capsule 5  . lidocaine-prilocaine (EMLA) cream Apply 1 application topically as needed. 1-2 hrs before port access 30 g 4  . lisinopril (PRINIVIL,ZESTRIL) 10 MG tablet Take 1 tablet (10 mg total) by mouth daily. 90 tablet 3  . traMADol (ULTRAM) 50 MG tablet Take 1 tablet (50 mg total) by mouth 3 (three) times daily as needed (pain). 90 tablet 1   No current facility-administered medications for this visit.   Facility-Administered  Medications Ordered in Other Visits  Medication Dose Route Frequency Provider Last Rate Last Dose  . sodium chloride 0.9 % injection 10 mL  10 mL Intravenous PRN Amy G Berry, PA-C   10 mL at 03/01/14 1425  . sodium chloride 0.9 % injection 10 mL  10 mL Intravenous PRN Chauncey Cruel, MD   10 mL at 03/01/14 2641    OBJECTIVE:  Middle-aged African-American woman who appears stated age 63 Vitals:   09/12/14 0916  BP: 138/88  Pulse:   Temp:   Resp:      Body mass index is 29.63 kg/(m^2).    ECOG FS: 1  Filed Weights   09/12/14 0906  Weight: 151 lb 11.2 oz (68.811 kg)    Skin: warm, dry  HEENT: sclerae anicteric, conjunctivae pink, oropharynx clear. No thrush or mucositis.  Lymph Nodes: No cervical or supraclavicular lymphadenopathy  Lungs: clear to auscultation bilaterally, no rales, wheezes, or rhonci  Heart: regular rate and rhythm  Abdomen: round, soft, non tender, positive bowel sounds  Musculoskeletal: No focal spinal tenderness, no peripheral edema  Neuro: non focal, well oriented, positive affect  Breasts: deferred  LAB RESULTS:   Lab Results  Component Value Date   WBC 7.2 09/12/2014   NEUTROABS 4.4 09/12/2014   HGB 12.3 09/12/2014   HCT 38.9 09/12/2014   MCV 80.4 09/12/2014   PLT 180 09/12/2014      Chemistry      Component Value Date/Time   NA 143 08/22/2014 1344   NA 138 01/09/2014 1522   K 3.9 08/22/2014 1344   K 4.2 01/09/2014 1522   CL 100 01/09/2014 1522   CO2 27 08/22/2014 1344   CO2 25 01/09/2014 1522   BUN 9.7 08/22/2014 1344   BUN 11 01/09/2014 1522   CREATININE 0.9 08/22/2014 1344   CREATININE 0.99 01/09/2014 1522      Component Value Date/Time   CALCIUM 9.3 08/22/2014 1344   CALCIUM 8.5 01/09/2014 1522   ALKPHOS 117 08/22/2014 1344   ALKPHOS 102 12/09/2013 1400   AST 16 08/22/2014 1344   AST 36 12/09/2013 1400   ALT 11 08/22/2014 1344   ALT 30 12/09/2013 1400   BILITOT 0.26 08/22/2014 1344   BILITOT 0.2* 12/09/2013 1400      STUDIES: Most recent echocardiogram on 08/24/14 showed an improvement in ejection fraction to 55-60%  ASSESSMENT: 63 y.o. [place] woman   (1)  s/p biopsy of separate Right breast masses 10/11/2013 for a clinical mT1 N0, stage IA invasive ductal carcinoma, grade II-III, one of the mass is being 85% estrogen receptor positive, progesterone receptor negative, with an MIB-1 of 17% and HER-2 amplified.  (2) status post right mastectomy and  sentinel lymph node sampling with immediate expander placement 12/13/2013 for a pT1c pN0, stage IA invasive ductal carcinoma, grade 3, again HER-2 positive.  (3) treated in the adjuvant setting with carboplatin, docetaxel, trastuzumab and pertuzumab, first dose on 02/21/2014   (a). Patient developed significant peripheral neuropathy after one cycle of chemotherapy, and both carboplatin and docetaxel were held beginning with cycle 2. Continued pertuzumab/ trastuzumab x 5 more cycles, completed 08/01/2014  (b)  single agent trastuzumab to be started 08/29/2014, to be continued every 3 weeks until May 2016  (d) most recent echocardiogram 07/10/2014 showed an ejection fraction in the 50-55% range.  (4) started on anastrozole on 04/04/2014 .   (5) Rheumatoid arthritis,  on Remicade, prednisone and methotrexate  (6) chemotherapy-induced peripheral neuropathy  - being treated with gabapentin, 300 BID, and tramadol, 50 mg every 6 hours as needed.     PLAN: Tasnia is doing well today. The labs were reviewed in detail and were entirely stable. We will proceed with her first dose of trastuzumab alone today and Naveh is comfortable with this. Her next echocardiogram will be due in early January 2016. She will of course continue on the anastrozole with the goal of 5 years of antiestrogen therapy.  She will continue with the gabapentin and tramadol for her various pains.   Kooper will return in 3 weeks for labs, a follow up visit, and her next dose of  trastuzumab. She understands and agrees with this plan. She knows the goal of treatment in her case is cure. She has been encouraged to call with any issues that might arise before her next visit here.   Marcelino Duster, NP  09/12/2014 9:33 AM

## 2014-09-12 NOTE — Patient Instructions (Signed)
[facility] Discharge Instructions for Patients Receiving Chemotherapy  Today you received the following chemotherapy agents:  Herceptin  To help prevent nausea and vomiting after your treatment, we encourage you to take your nausea medication as ordered per MD.   If you develop nausea and vomiting that is not controlled by your nausea medication, call the clinic.   BELOW ARE SYMPTOMS THAT SHOULD BE REPORTED IMMEDIATELY:  *FEVER GREATER THAN 100.5 F  *CHILLS WITH OR WITHOUT FEVER  NAUSEA AND VOMITING THAT IS NOT CONTROLLED WITH YOUR NAUSEA MEDICATION  *UNUSUAL SHORTNESS OF BREATH  *UNUSUAL BRUISING OR BLEEDING  TENDERNESS IN MOUTH AND THROAT WITH OR WITHOUT PRESENCE OF ULCERS  *URINARY PROBLEMS  *BOWEL PROBLEMS  UNUSUAL RASH Items with * indicate a potential emergency and should be followed up as soon as possible.  Feel free to call the clinic you have any questions or concerns. The clinic phone number is [phone].    

## 2014-09-12 NOTE — Telephone Encounter (Signed)
Per staff message and POF I have scheduled appts. Advised scheduler of appts. JMW  

## 2014-09-12 NOTE — Patient Instructions (Signed)

## 2014-09-12 NOTE — Telephone Encounter (Signed)
, °

## 2014-09-20 NOTE — H&P (Signed)
  Subjective:    Patient ID: Angela Gross is a 63 y.o. female.  HPI  7 months post op from right mastectomy with TE reconstruction. Course complicated by seroma and cellulitis after drain fell out with return to OR for exchange TE. Completed chemotherapy 2 months ago. Continues on herceptin, arimidex.   Path with negative nodes, 1.8 cm tumor. Er/PR+, Her 2 +.  Review of Oncology notes indicate c/o SOB, ECHO today read as normal with EF 55-60%. Has developed neuropathy with treatment and stared on neurontin for this.  History significant for RA and Raynauds. Angela Gross is her rheumatologist. Has held MTX and steroids throughout chemotherapy and no plans to start again.   Mastectomy wt 1194 g Review of Systems     Objective:    Physical Exam  Cardiovascular: Normal rate and regular rhythm.  Pulmonary/Chest: Effort normal and breath sounds normal.  port Right chest, contour depression superiorly and medially, and lateral displacement implant when supine  Flaps soft, mastectomy incision intact,  SN to nipple L30.5 BW R 15 L17 Nipple to IMF L 10 cm Assessment:      R breast cancer , post chemo, on Herceptin Rheumatoid arthritis, Raynauds Asymmetry native and reconstructed breasts    Plan:      Plan second stage surgery with removal expander and placement implant. Reviewed saline vs silicone, risks rupture poss MRI surveillance, contracture, rippling, softness of each. Plan round silicone. Plan lipofilling to improve right upper chest depression. Plan left breast mastopexy for symmetry. Reviewed drains, abdominal binder and breast compression, T shaped incision on left, will need reduction for few hundred grams for symmetry, bruising and drainage abdomen. Patient has elected to go home if able post op. Reviewed risks including but not limited to bleeding, infection, wound healing problems, seroma, hematoma, DVT/PE, cardiopulmonary complications, damage to deeper structures, loss  of nipple on left, need for additional surgery, variable take of fat grafting and need to repeat procedure.  Mentor style 9200 tissue expander 450 ml  390 ml total fill volume + 70 ml = 460 total    Irene Limbo, MD [facility] Plastic & Reconstructive Surgery [phone]

## 2014-09-22 ENCOUNTER — Encounter (HOSPITAL_BASED_OUTPATIENT_CLINIC_OR_DEPARTMENT_OTHER): Payer: Self-pay | Admitting: *Deleted

## 2014-09-22 NOTE — Progress Notes (Signed)
To bring all meds and overnight bag-labs and ekg done

## 2014-09-26 ENCOUNTER — Encounter (HOSPITAL_BASED_OUTPATIENT_CLINIC_OR_DEPARTMENT_OTHER): Payer: Self-pay | Admitting: *Deleted

## 2014-09-26 ENCOUNTER — Ambulatory Visit (HOSPITAL_BASED_OUTPATIENT_CLINIC_OR_DEPARTMENT_OTHER)
Admission: RE | Admit: 2014-09-26 | Discharge: 2014-09-27 | Disposition: A | Discharge status: Home / Self Care | Payer: Medicare Other | Source: Ambulatory Visit | Attending: Plastic Surgery | Admitting: Plastic Surgery

## 2014-09-26 ENCOUNTER — Ambulatory Visit (HOSPITAL_BASED_OUTPATIENT_CLINIC_OR_DEPARTMENT_OTHER): Payer: Medicare Other | Admitting: Anesthesiology

## 2014-09-26 ENCOUNTER — Encounter (HOSPITAL_BASED_OUTPATIENT_CLINIC_OR_DEPARTMENT_OTHER)
Admission: RE | Disposition: A | Discharge status: Home / Self Care | Payer: Self-pay | Source: Ambulatory Visit | Attending: Plastic Surgery

## 2014-09-26 DIAGNOSIS — Z853 Personal history of malignant neoplasm of breast: Secondary | ICD-10-CM | POA: Diagnosis not present

## 2014-09-26 DIAGNOSIS — E119 Type 2 diabetes mellitus without complications: Secondary | ICD-10-CM | POA: Insufficient documentation

## 2014-09-26 DIAGNOSIS — I10 Essential (primary) hypertension: Secondary | ICD-10-CM | POA: Insufficient documentation

## 2014-09-26 DIAGNOSIS — N651 Disproportion of reconstructed breast: Secondary | ICD-10-CM | POA: Diagnosis present

## 2014-09-26 DIAGNOSIS — Z9011 Acquired absence of right breast and nipple: Secondary | ICD-10-CM | POA: Insufficient documentation

## 2014-09-26 HISTORY — PX: REMOVAL OF BILATERAL TISSUE EXPANDERS WITH PLACEMENT OF BILATERAL BREAST IMPLANTS: SHX6431

## 2014-09-26 HISTORY — PX: MASTOPEXY: SHX5358

## 2014-09-26 HISTORY — PX: LIPOSUCTION WITH LIPOFILLING: SHX6436

## 2014-09-26 SURGERY — REMOVAL, TISSUE EXPANDER, BREAST, BILATERAL, WITH BILATERAL IMPLANT IMPLANT INSERTION
Anesthesia: General | Site: Breast | Laterality: Right

## 2014-09-26 MED ORDER — GABAPENTIN 300 MG PO CAPS
300.0000 mg | ORAL_CAPSULE | Freq: Two times a day (BID) | ORAL | Status: DC
  Administered 2014-09-26: 300 mg via ORAL
  Filled 2014-09-26: qty 1

## 2014-09-26 MED ORDER — KETOROLAC TROMETHAMINE 30 MG/ML IJ SOLN
INTRAMUSCULAR | Status: AC
  Filled 2014-09-26: qty 1

## 2014-09-26 MED ORDER — FENTANYL CITRATE 0.05 MG/ML IJ SOLN
25.0000 ug | INTRAMUSCULAR | Status: DC | PRN
Start: 2014-09-26 — End: 2014-09-27
  Administered 2014-09-26: 50 ug via INTRAVENOUS
  Administered 2014-09-26 (×2): 25 ug via INTRAVENOUS

## 2014-09-26 MED ORDER — GLYCOPYRROLATE 0.2 MG/ML IJ SOLN
INTRAMUSCULAR | Status: DC | PRN
  Administered 2014-09-26: .4 mg via INTRAVENOUS
  Administered 2014-09-26: 0.2 mg via INTRAVENOUS

## 2014-09-26 MED ORDER — OXYCODONE-ACETAMINOPHEN 5-325 MG PO TABS: 1.0000 | ORAL_TABLET | ORAL | Status: DC | PRN

## 2014-09-26 MED ORDER — SUFENTANIL CITRATE 50 MCG/ML IV SOLN
INTRAVENOUS | Status: DC | PRN
  Administered 2014-09-26: 10 ug via INTRAVENOUS
  Administered 2014-09-26: 15 ug via INTRAVENOUS

## 2014-09-26 MED ORDER — FENTANYL CITRATE 0.05 MG/ML IJ SOLN: 50.0000 ug | INTRAMUSCULAR | Status: DC | PRN

## 2014-09-26 MED ORDER — ONDANSETRON HCL 4 MG PO TABS: 4.0000 mg | ORAL_TABLET | Freq: Four times a day (QID) | ORAL | Status: DC | PRN

## 2014-09-26 MED ORDER — TRAMADOL HCL 50 MG PO TABS
ORAL_TABLET | ORAL | Status: AC
  Filled 2014-09-26: qty 1

## 2014-09-26 MED ORDER — SUFENTANIL CITRATE 50 MCG/ML IV SOLN
INTRAVENOUS | Status: AC
  Filled 2014-09-26: qty 1

## 2014-09-26 MED ORDER — TRAMADOL HCL 50 MG PO TABS
50.0000 mg | ORAL_TABLET | Freq: Three times a day (TID) | ORAL | Status: DC | PRN
  Administered 2014-09-26 – 2014-09-27 (×2): 50 mg via ORAL
  Filled 2014-09-26: qty 1

## 2014-09-26 MED ORDER — DEXAMETHASONE SODIUM PHOSPHATE 4 MG/ML IJ SOLN
INTRAMUSCULAR | Status: DC | PRN
  Administered 2014-09-26: 10 mg via INTRAVENOUS

## 2014-09-26 MED ORDER — KETOROLAC TROMETHAMINE 15 MG/ML IJ SOLN
15.0000 mg | Freq: Three times a day (TID) | INTRAMUSCULAR | Status: DC
  Administered 2014-09-26 – 2014-09-27 (×2): 15 mg via INTRAVENOUS
  Filled 2014-09-26: qty 1

## 2014-09-26 MED ORDER — OXYCODONE-ACETAMINOPHEN 5-325 MG PO TABS
ORAL_TABLET | ORAL | Status: AC
  Filled 2014-09-26: qty 2

## 2014-09-26 MED ORDER — PROPOFOL 10 MG/ML IV BOLUS
INTRAVENOUS | Status: DC | PRN
  Administered 2014-09-26: 150 mg via INTRAVENOUS

## 2014-09-26 MED ORDER — CEFAZOLIN SODIUM-DEXTROSE 2-3 GM-% IV SOLR
INTRAVENOUS | Status: AC
  Filled 2014-09-26: qty 50

## 2014-09-26 MED ORDER — HYDROMORPHONE HCL 1 MG/ML IJ SOLN
INTRAMUSCULAR | Status: AC
  Filled 2014-09-26: qty 1

## 2014-09-26 MED ORDER — CHLORHEXIDINE GLUCONATE 4 % EX LIQD: 1.0000 "application " | Freq: Once | CUTANEOUS | Status: DC

## 2014-09-26 MED ORDER — MIDAZOLAM HCL 2 MG/2ML IJ SOLN
INTRAMUSCULAR | Status: AC
  Filled 2014-09-26: qty 2

## 2014-09-26 MED ORDER — LISINOPRIL 10 MG PO TABS: 10.0000 mg | ORAL_TABLET | Freq: Every day | ORAL | Status: DC

## 2014-09-26 MED ORDER — KCL IN DEXTROSE-NACL 20-5-0.45 MEQ/L-%-% IV SOLN
INTRAVENOUS | Status: DC
  Administered 2014-09-26: 19:00:00 via INTRAVENOUS
  Filled 2014-09-26: qty 1000

## 2014-09-26 MED ORDER — CARVEDILOL 12.5 MG PO TABS: 12.5000 mg | ORAL_TABLET | Freq: Two times a day (BID) | ORAL | Status: DC

## 2014-09-26 MED ORDER — LIDOCAINE HCL (CARDIAC) 20 MG/ML IV SOLN
INTRAVENOUS | Status: DC | PRN
  Administered 2014-09-26: 40 mg via INTRAVENOUS

## 2014-09-26 MED ORDER — SULFAMETHOXAZOLE-TRIMETHOPRIM 800-160 MG PO TABS: 1.0000 | ORAL_TABLET | Freq: Two times a day (BID) | ORAL | Status: DC

## 2014-09-26 MED ORDER — ONDANSETRON HCL 4 MG/2ML IJ SOLN
INTRAMUSCULAR | Status: DC | PRN
  Administered 2014-09-26: 4 mg via INTRAVENOUS

## 2014-09-26 MED ORDER — PHENYLEPHRINE HCL 10 MG/ML IJ SOLN
10.0000 mg | INTRAMUSCULAR | Status: DC | PRN
  Administered 2014-09-26: 40 ug/min via INTRAVENOUS

## 2014-09-26 MED ORDER — LIDOCAINE HCL 1 % IJ SOLN
INTRAVENOUS | Status: DC | PRN
  Administered 2014-09-26: 12:00:00

## 2014-09-26 MED ORDER — OXYCODONE-ACETAMINOPHEN 5-325 MG PO TABS
1.0000 | ORAL_TABLET | ORAL | Status: DC | PRN
  Administered 2014-09-26 – 2014-09-27 (×4): 2 via ORAL
  Filled 2014-09-26 (×3): qty 2

## 2014-09-26 MED ORDER — FENTANYL CITRATE 0.05 MG/ML IJ SOLN
INTRAMUSCULAR | Status: AC
  Filled 2014-09-26: qty 2

## 2014-09-26 MED ORDER — SODIUM CHLORIDE 0.9 % IV SOLN
INTRAVENOUS | Status: DC | PRN
  Administered 2014-09-26: 900 mL

## 2014-09-26 MED ORDER — ONDANSETRON HCL 4 MG/2ML IJ SOLN: 4.0000 mg | Freq: Once | INTRAMUSCULAR | Status: AC | PRN

## 2014-09-26 MED ORDER — HYDROMORPHONE HCL 1 MG/ML IJ SOLN: 0.5000 mg | INTRAMUSCULAR | Status: DC | PRN

## 2014-09-26 MED ORDER — HYDROMORPHONE HCL 1 MG/ML IJ SOLN
0.2500 mg | INTRAMUSCULAR | Status: DC | PRN
  Administered 2014-09-26 (×2): 0.5 mg via INTRAVENOUS

## 2014-09-26 MED ORDER — NEOSTIGMINE METHYLSULFATE 10 MG/10ML IV SOLN
INTRAVENOUS | Status: DC | PRN
  Administered 2014-09-26: 3 mg via INTRAVENOUS

## 2014-09-26 MED ORDER — EPINEPHRINE HCL 1 MG/ML IJ SOLN
INTRAMUSCULAR | Status: AC
  Filled 2014-09-26: qty 1

## 2014-09-26 MED ORDER — MIDAZOLAM HCL 2 MG/2ML IJ SOLN: 1.0000 mg | INTRAMUSCULAR | Status: DC | PRN

## 2014-09-26 MED ORDER — LACTATED RINGERS IV SOLN
INTRAVENOUS | Status: DC
Start: 2014-09-26 — End: 2014-09-26
  Administered 2014-09-26 (×2): via INTRAVENOUS

## 2014-09-26 MED ORDER — ONDANSETRON HCL 4 MG/2ML IJ SOLN
4.0000 mg | Freq: Four times a day (QID) | INTRAMUSCULAR | Status: DC | PRN
  Administered 2014-09-27: 4 mg via INTRAVENOUS
  Filled 2014-09-26: qty 2

## 2014-09-26 MED ORDER — ROCURONIUM BROMIDE 100 MG/10ML IV SOLN
INTRAVENOUS | Status: DC | PRN
  Administered 2014-09-26: 30 mg via INTRAVENOUS

## 2014-09-26 MED ORDER — PHENYLEPHRINE HCL 10 MG/ML IJ SOLN
INTRAMUSCULAR | Status: DC | PRN
  Administered 2014-09-26: 50 ug via INTRAVENOUS
  Administered 2014-09-26: 40 ug via INTRAVENOUS

## 2014-09-26 MED ORDER — CEFAZOLIN SODIUM 1-5 GM-% IV SOLN
1.0000 g | Freq: Three times a day (TID) | INTRAVENOUS | Status: DC
  Administered 2014-09-26: 1 g via INTRAVENOUS
  Filled 2014-09-26 (×2): qty 50

## 2014-09-26 MED ORDER — SUCCINYLCHOLINE CHLORIDE 20 MG/ML IJ SOLN
INTRAMUSCULAR | Status: DC | PRN
  Administered 2014-09-26: 100 mg via INTRAVENOUS

## 2014-09-26 MED ORDER — CYCLOBENZAPRINE HCL 10 MG PO TABS
10.0000 mg | ORAL_TABLET | Freq: Three times a day (TID) | ORAL | Status: DC | PRN
  Administered 2014-09-26: 10 mg via ORAL
  Filled 2014-09-26: qty 1

## 2014-09-26 MED ORDER — EPHEDRINE SULFATE 50 MG/ML IJ SOLN
INTRAMUSCULAR | Status: DC | PRN
  Administered 2014-09-26: 15 mg via INTRAVENOUS

## 2014-09-26 MED ORDER — BUPIVACAINE-EPINEPHRINE (PF) 0.25% -1:200000 IJ SOLN
INTRAMUSCULAR | Status: AC
  Filled 2014-09-26: qty 60

## 2014-09-26 MED ORDER — CEFAZOLIN SODIUM-DEXTROSE 2-3 GM-% IV SOLR
2.0000 g | INTRAVENOUS | Status: AC
  Administered 2014-09-26: 2 g via INTRAVENOUS

## 2014-09-26 SURGICAL SUPPLY — 96 items
BAG DECANTER FOR FLEXI CONT (MISCELLANEOUS) ×5 IMPLANT
BANDAGE ELASTIC 6 VELCRO ST LF (GAUZE/BANDAGES/DRESSINGS) IMPLANT
BINDER ABDOMINAL 10 UNV 27-48 (MISCELLANEOUS) ×5 IMPLANT
BINDER ABDOMINAL 12 SM 30-45 (SOFTGOODS) IMPLANT
BINDER BREAST LRG (GAUZE/BANDAGES/DRESSINGS) IMPLANT
BINDER BREAST MEDIUM (GAUZE/BANDAGES/DRESSINGS) IMPLANT
BINDER BREAST XLRG (GAUZE/BANDAGES/DRESSINGS) ×5 IMPLANT
BINDER BREAST XXLRG (GAUZE/BANDAGES/DRESSINGS) IMPLANT
BIOPATCH RED 1 DISK 7.0 (GAUZE/BANDAGES/DRESSINGS) IMPLANT
BIOPATCH RED 1IN DISK 7.0MM (GAUZE/BANDAGES/DRESSINGS)
BLADE SURG 10 STRL SS (BLADE) ×10 IMPLANT
BLADE SURG 11 STRL SS (BLADE) IMPLANT
BLADE SURG 15 STRL LF DISP TIS (BLADE) ×3 IMPLANT
BLADE SURG 15 STRL SS (BLADE) ×2
BNDG GAUZE ELAST 4 BULKY (GAUZE/BANDAGES/DRESSINGS) ×10 IMPLANT
CANISTER LIPO FAT HARVEST (MISCELLANEOUS) ×5 IMPLANT
CANISTER SUCT 1200ML W/VALVE (MISCELLANEOUS) ×10 IMPLANT
CHLORAPREP W/TINT 26ML (MISCELLANEOUS) ×10 IMPLANT
CLOSURE WOUND 1/2 X4 (GAUZE/BANDAGES/DRESSINGS)
COVER BACK TABLE 60X90IN (DRAPES) IMPLANT
COVER MAYO STAND STRL (DRAPES) ×5 IMPLANT
DECANTER SPIKE VIAL GLASS SM (MISCELLANEOUS) IMPLANT
DRAIN CHANNEL 15F RND FF W/TCR (WOUND CARE) ×5 IMPLANT
DRAIN CHANNEL 19F RND (DRAIN) IMPLANT
DRAPE LAPAROSCOPIC ABDOMINAL (DRAPES) IMPLANT
DRSG PAD ABDOMINAL 8X10 ST (GAUZE/BANDAGES/DRESSINGS) ×20 IMPLANT
DRSG TEGADERM 2-3/8X2-3/4 SM (GAUZE/BANDAGES/DRESSINGS) IMPLANT
ELECT BLADE 4.0 EZ CLEAN MEGAD (MISCELLANEOUS) ×5
ELECT COATED BLADE 2.86 ST (ELECTRODE) ×5 IMPLANT
ELECT REM PT RETURN 9FT ADLT (ELECTROSURGICAL) ×5
ELECTRODE BLDE 4.0 EZ CLN MEGD (MISCELLANEOUS) ×3 IMPLANT
ELECTRODE REM PT RTRN 9FT ADLT (ELECTROSURGICAL) ×3 IMPLANT
EVACUATOR SILICONE 100CC (DRAIN) ×5 IMPLANT
FILTER LIPOSUCTION (MISCELLANEOUS) ×5 IMPLANT
GAUZE SPONGE 4X4 12PLY STRL (GAUZE/BANDAGES/DRESSINGS) IMPLANT
GLOVE BIO SURGEON STRL SZ 6 (GLOVE) ×15 IMPLANT
GLOVE BIO SURGEON STRL SZ 6.5 (GLOVE) IMPLANT
GLOVE BIO SURGEONS STRL SZ 6.5 (GLOVE)
GLOVE BIOGEL PI IND STRL 6.5 (GLOVE) IMPLANT
GLOVE BIOGEL PI IND STRL 7.0 (GLOVE) ×3 IMPLANT
GLOVE BIOGEL PI INDICATOR 6.5 (GLOVE)
GLOVE BIOGEL PI INDICATOR 7.0 (GLOVE) ×2
GLOVE ECLIPSE 6.5 STRL STRAW (GLOVE) ×5 IMPLANT
GOWN STRL REUS W/ TWL LRG LVL3 (GOWN DISPOSABLE) ×6 IMPLANT
GOWN STRL REUS W/TWL LRG LVL3 (GOWN DISPOSABLE) ×4
IMPLANT BREAST GEL HP 700CC (Breast) ×5 IMPLANT
IV NS 500ML (IV SOLUTION)
IV NS 500ML BAXH (IV SOLUTION) IMPLANT
KIT FILL SYSTEM UNIVERSAL (SET/KITS/TRAYS/PACK) IMPLANT
LINER CANISTER 1000CC FLEX (MISCELLANEOUS) ×5 IMPLANT
LIQUID BAND (GAUZE/BANDAGES/DRESSINGS) ×15 IMPLANT
NDL SAFETY ECLIPSE 18X1.5 (NEEDLE) ×3 IMPLANT
NEEDLE HYPO 18GX1.5 SHARP (NEEDLE) ×2
NEEDLE HYPO 25X1 1.5 SAFETY (NEEDLE) IMPLANT
NS IRRIG 1000ML POUR BTL (IV SOLUTION) ×5 IMPLANT
PACK BASIN DAY SURGERY FS (CUSTOM PROCEDURE TRAY) ×5 IMPLANT
PACK UNIVERSAL I (CUSTOM PROCEDURE TRAY) ×5 IMPLANT
PAD ALCOHOL SWAB (MISCELLANEOUS) ×5 IMPLANT
PENCIL BUTTON HOLSTER BLD 10FT (ELECTRODE) ×5 IMPLANT
PIN SAFETY STERILE (MISCELLANEOUS) ×5 IMPLANT
SIZER BREAST GEL HP 700CC (SIZER) ×5
SIZER BREAST GENERIC (SIZER) ×5 IMPLANT
SIZER BREAST REUSE 650CC (SIZER) ×2
SIZER BRST GEL HP 700CC (SIZER) ×3 IMPLANT
SIZER BRST REUSE P6.4 650CC (SIZER) ×3 IMPLANT
SIZER GENERIC MENTOR (SIZER) ×5 IMPLANT
SLEEVE SCD COMPRESS KNEE MED (MISCELLANEOUS) ×5 IMPLANT
SPONGE GAUZE 4X4 12PLY STER LF (GAUZE/BANDAGES/DRESSINGS) IMPLANT
SPONGE LAP 18X18 X RAY DECT (DISPOSABLE) ×15 IMPLANT
STAPLER VISISTAT 35W (STAPLE) ×10 IMPLANT
STRIP CLOSURE SKIN 1/2X4 (GAUZE/BANDAGES/DRESSINGS) IMPLANT
SUT ETHILON 2 0 FS 18 (SUTURE) IMPLANT
SUT MNCRL AB 4-0 PS2 18 (SUTURE) ×15 IMPLANT
SUT MON AB 3-0 SH 27 (SUTURE) ×2
SUT MON AB 3-0 SH27 (SUTURE) ×3 IMPLANT
SUT MON AB 5-0 PS2 18 (SUTURE) IMPLANT
SUT PDS 3-0 CT2 (SUTURE)
SUT PDS AB 2-0 CT2 27 (SUTURE) IMPLANT
SUT PDS II 3-0 CT2 27 ABS (SUTURE) IMPLANT
SUT SILK 3 0 PS 1 (SUTURE) IMPLANT
SUT VIC AB 3-0 PS1 18 (SUTURE) ×6
SUT VIC AB 3-0 PS1 18XBRD (SUTURE) ×9 IMPLANT
SUT VIC AB 3-0 SH 27 (SUTURE)
SUT VIC AB 3-0 SH 27X BRD (SUTURE) IMPLANT
SUT VICRYL 4-0 PS2 18IN ABS (SUTURE) ×15 IMPLANT
SYR 20CC LL (SYRINGE) IMPLANT
SYR 50ML LL SCALE MARK (SYRINGE) ×20 IMPLANT
SYR BULB IRRIGATION 50ML (SYRINGE) ×5 IMPLANT
SYR CONTROL 10ML LL (SYRINGE) IMPLANT
SYR TB 1ML LL NO SAFETY (SYRINGE) ×5 IMPLANT
TOWEL OR 17X24 6PK STRL BLUE (TOWEL DISPOSABLE) ×10 IMPLANT
TUBE CONNECTING 20'X1/4 (TUBING) ×1
TUBE CONNECTING 20X1/4 (TUBING) ×4 IMPLANT
TUBING SET GRADUATE ASPIR 12FT (MISCELLANEOUS) ×5 IMPLANT
UNDERPAD 30X30 INCONTINENT (UNDERPADS AND DIAPERS) ×10 IMPLANT
YANKAUER SUCT BULB TIP NO VENT (SUCTIONS) ×5 IMPLANT

## 2014-09-26 NOTE — Op Note (Signed)
Operative Note   DATE OF OPERATION: 12.15.2015   LOCATION: [facility]-observation  SURGICAL DIVISION: Plastic Surgery  PREOPERATIVE DIAGNOSES:  1. Acquired absence right breast 2. History right breast cancer. 3. Asymmetry native and reconstructed breast  POSTOPERATIVE DIAGNOSES:  same  PROCEDURE:  1. Removal right breast expander and placement silicone implant 2. Lipofilling to right chest 3. Left breast mastopexy for symmetry  SURGEON: Irene Limbo MD MBA  ASSISTANT: none  ANESTHESIA:  General.   EBL: 737 ml  COMPLICATIONS: None.   INDICATIONS FOR PROCEDURE:  The patient, Angela Gross, is a 63 y.o. female born on 1951/03/26, is here for second stage reconstruction following skin sparing mastectomy with placement permanent implant and left breast mastopexy for symmetry.   FINDINGS: Mentor Siltex Ultra High Profile 700 ml implant placed in right breast. Ref [phone] SN [id number]  DESCRIPTION OF PROCEDURE:  The patient was marked with the patient in the preoperative area in standing position to mark areas over anterior abdomen for fat harvest, chest midline, sternal notch, breast meridians, inframammary folds. The location for nipple areolar complex marked on anterior surface left breast by palpation. With aid of Wise pattern marker, location of new nipple areolar complex and vertical limbs (8 cm) were marked. The patient was taken to the operating room. SCDs were placed and IV antibiotics were given. The patient's operative site was prepped and draped in a sterile fashion. A time out was performed and all information was confirmed to be correct. Tumescent solution infiltrated over bilateral anterior abdomen through stab incisions. Total of 400 ml fluid infiltrated. Power assisted liposuction completed over supra and infraumbilical abdomen to endpoint of symmetric contour and soft tissue thickness. Fat prepared for infiltration with washing and gravity. Stab incisions  approximated with interrupted 4-0 monocryl.  Over left breast, inferior pedicle marked and nipple areolar complex marked with 50 mm diameter marker. Medial and lateral triangles of skin and breast tissue excised, taking care to leave layers of fascia and breast tissue over muscle layer. Additional tissue excised from superior pole and medial and lateral skin flaps developed for redraping. Breast tailor tacked closed.    I then directed attention to right breast. Incision made through prior transverse scar and capsule entered. Patient had majority of her acellular dermis removed at time of last surgery due to cellulitis and seroma. Tissue expander removed. Anterior capsulectomy performed to aid with adherence of textured implants. Submuscular dissection completed toward clavicle. Patient brought to upright sitting position and various sizers tried. Final implant 700 ml Siltex round silicone ultra high profile selected.   Using blunt canula, prepared fat placed subcutaneously and intramuscular over superior poles and to reconstruct lateral axillary tail breast. Breast cavity irrigated with solution containing ancef, bacitracin, and gentamicin. Hemostasis obtained. 15 Fr drain placed in cavity. Implant placed and layered closure completed with 3-0 vicryl between superficial fascia and lower pectoralis border. 4-0 vicryl placed in dermis followed by running 4-0 monocryl subcuticular for skin closure over right breast. Over left breast, Closure completed with 3-0 vicryl approximate dermis along inframammary fold. NAC inset and vertical limb closed with 4-0 vicryl in dermis.Skin closure completed with 4-0 monocryl subcuticular.Tissue adhesive, followed by dry dressing and breast and abdominal binders applied.  The patient was allowed to wake from anesthesia, extubated and taken to the recovery room in satisfactory condition.    SPECIMENS: Left breast reduction.  DRAINS: 15 Fr JP in right reconstructed breast.    Irene Limbo, MD [facility] Plastic & Reconstructive Surgery [phone]

## 2014-09-26 NOTE — Transfer of Care (Signed)
Immediate Anesthesia Transfer of Care Note  Patient: Angela Gross  Procedure(s) Performed: Procedure(s): REMOVAL OF RIGHT TISSUE EXPANDER WITH PLACEMENT OF RIGHT BREAST IMPLANT LIPOFILLING TO RIGHT BREAST/LEFT BREAST MASTOPEXY FOR SYMMETRY (Bilateral) LIPOSUCTION WITH LIPOFILLING (Right) MASTOPEXY (Left)  Patient Location: PACU  Anesthesia Type:General  Level of Consciousness: awake, alert  and oriented  Airway & Oxygen Therapy: Patient Spontanous Breathing and Patient connected to face mask oxygen  Post-op Assessment: Report given to PACU RN and Post -op Vital signs reviewed and stable  Post vital signs: Reviewed and stable  Complications: No apparent anesthesia complications

## 2014-09-26 NOTE — Anesthesia Preprocedure Evaluation (Signed)
Anesthesia Evaluation  Patient identified by MRN, date of birth, ID band Patient awake    Reviewed: Allergy & Precautions, H&P , NPO status , Patient's Chart, lab work & pertinent test results, Unable to perform ROS - Chart review only  Airway Mallampati: II  TM Distance: >3 FB Neck ROM: Full    Dental  (+) Edentulous Upper, Edentulous Lower   Pulmonary  breath sounds clear to auscultation        Cardiovascular hypertension, Rhythm:Regular Rate:Normal     Neuro/Psych    GI/Hepatic   Endo/Other  diabetes  Renal/GU      Musculoskeletal   Abdominal   Peds  Hematology   Anesthesia Other Findings   Reproductive/Obstetrics                             Anesthesia Physical Anesthesia Plan  ASA: III  Anesthesia Plan: General   Post-op Pain Management:    Induction: Intravenous  Airway Management Planned: Oral ETT  Additional Equipment:   Intra-op Plan:   Post-operative Plan: Extubation in OR  Informed Consent: I have reviewed the patients History and Physical, chart, labs and discussed the procedure including the risks, benefits and alternatives for the proposed anesthesia with the patient or authorized representative who has indicated his/her understanding and acceptance.     Plan Discussed with: CRNA and Anesthesiologist  Anesthesia Plan Comments: (Breat cancer S/P R. mastectomy and chemotherapy Chemotherapy induced cardiomyopathy, mild Raynaud's Phenomenon   Plan GA with oral ETT  Roberts Gaudy, MD)        Anesthesia Quick Evaluation

## 2014-09-26 NOTE — Interval H&P Note (Signed)
History and Physical Interval Note:  09/26/2014 7:04 AM  Angela Gross  has presented today for surgery, with the diagnosis of history of breast cancer  The various methods of treatment have been discussed with the patient and family. After consideration of risks, benefits and other options for treatment, the patient has consented to  Procedure(s): REMOVAL OF RIGHT TISSUE EXPANDERS WITH PLACEMENT OF RIGHT BREAST IMPLANT LIPOFILLING TO RIGHT BREAST/LEFT BREAST MASTOPEXY FOR SYMETRY (Right) LIPOSUCTION WITH LIPOFILLING (Right) MASTOPEXY (Left) as a surgical intervention .  The patient's history has been reviewed, patient examined, no change in status, stable for surgery.  I have reviewed the patient's chart and labs.  Questions were answered to the patient's satisfaction.     Angela Gross

## 2014-09-26 NOTE — Anesthesia Procedure Notes (Signed)
Procedure Name: Intubation Date/Time: 09/26/2014 11:04 AM Performed by: Melynda Ripple D Pre-anesthesia Checklist: Patient identified, Emergency Drugs available, Suction available and Patient being monitored Patient Re-evaluated:Patient Re-evaluated prior to inductionOxygen Delivery Method: Circle System Utilized Preoxygenation: Pre-oxygenation with 100% oxygen Intubation Type: IV induction Ventilation: Mask ventilation without difficulty Laryngoscope Size: Mac and 3 Grade View: Grade I Tube type: Oral Number of attempts: 1 Airway Equipment and Method: stylet and oral airway Placement Confirmation: ETT inserted through vocal cords under direct vision,  positive ETCO2 and breath sounds checked- equal and bilateral Secured at: 23 cm Tube secured with: Tape Dental Injury: Teeth and Oropharynx as per pre-operative assessment

## 2014-09-26 NOTE — Anesthesia Postprocedure Evaluation (Signed)
  Anesthesia Post-op Note  Patient: [name]  Procedure(s) Performed: Procedure(s): REMOVAL OF RIGHT TISSUE EXPANDER WITH PLACEMENT OF RIGHT BREAST IMPLANT LIPOFILLING TO RIGHT BREAST/LEFT BREAST MASTOPEXY FOR SYMMETRY (Bilateral) LIPOSUCTION WITH LIPOFILLING (Right) MASTOPEXY (Left)  Patient Location: PACU  Anesthesia Type:General  Level of Consciousness: awake and alert   Airway and Oxygen Therapy: Patient Spontanous Breathing  Post-op Pain: moderate  Post-op Assessment: Post-op Vital signs reviewed, Patient's Cardiovascular Status Stable and Respiratory Function Stable  Post-op Vital Signs: Reviewed  Filed Vitals:   09/26/14 1500  BP: 142/89  Pulse: 82  Temp: 36.4 C  Resp: 22    Complications: No apparent anesthesia complications

## 2014-09-27 DIAGNOSIS — N651 Disproportion of reconstructed breast: Secondary | ICD-10-CM | POA: Diagnosis not present

## 2014-09-27 NOTE — Addendum Note (Signed)
Addendum  created 09/27/14 1434 by Christia Reading Jezreel Sisk   Modules edited: Charges VN

## 2014-09-28 ENCOUNTER — Encounter (HOSPITAL_BASED_OUTPATIENT_CLINIC_OR_DEPARTMENT_OTHER): Payer: Self-pay | Admitting: Plastic Surgery

## 2014-10-03 ENCOUNTER — Ambulatory Visit (HOSPITAL_BASED_OUTPATIENT_CLINIC_OR_DEPARTMENT_OTHER): Payer: Medicare Other | Admitting: Nurse Practitioner

## 2014-10-03 ENCOUNTER — Ambulatory Visit (HOSPITAL_BASED_OUTPATIENT_CLINIC_OR_DEPARTMENT_OTHER): Payer: Medicare Other

## 2014-10-03 ENCOUNTER — Other Ambulatory Visit: Payer: Self-pay | Admitting: Oncology

## 2014-10-03 ENCOUNTER — Telehealth: Payer: Self-pay | Admitting: Nurse Practitioner

## 2014-10-03 ENCOUNTER — Ambulatory Visit (HOSPITAL_BASED_OUTPATIENT_CLINIC_OR_DEPARTMENT_OTHER): Payer: Medicare Other | Admitting: Lab

## 2014-10-03 ENCOUNTER — Other Ambulatory Visit: Payer: Medicare Other

## 2014-10-03 VITALS — BP 135/73 | HR 81 | Temp 98.5°F | Resp 18 | Ht 60.0 in | Wt 152.0 lb

## 2014-10-03 DIAGNOSIS — Z5112 Encounter for antineoplastic immunotherapy: Secondary | ICD-10-CM

## 2014-10-03 DIAGNOSIS — G620 Drug-induced polyneuropathy: Secondary | ICD-10-CM

## 2014-10-03 DIAGNOSIS — T451X5A Adverse effect of antineoplastic and immunosuppressive drugs, initial encounter: Secondary | ICD-10-CM

## 2014-10-03 DIAGNOSIS — C50311 Malignant neoplasm of lower-inner quadrant of right female breast: Secondary | ICD-10-CM

## 2014-10-03 DIAGNOSIS — Z170 Estrogen receptor positive status [ER+]: Secondary | ICD-10-CM

## 2014-10-03 DIAGNOSIS — N951 Menopausal and female climacteric states: Secondary | ICD-10-CM

## 2014-10-03 DIAGNOSIS — K5900 Constipation, unspecified: Secondary | ICD-10-CM

## 2014-10-03 DIAGNOSIS — M2550 Pain in unspecified joint: Secondary | ICD-10-CM

## 2014-10-03 DIAGNOSIS — Z95828 Presence of other vascular implants and grafts: Secondary | ICD-10-CM

## 2014-10-03 LAB — CBC WITH DIFFERENTIAL/PLATELET
BASO%: 0.2 % (ref 0.0–2.0)
Basophils Absolute: 0 10*3/uL (ref 0.0–0.1)
EOS ABS: 0.2 10*3/uL (ref 0.0–0.5)
EOS%: 2.3 % (ref 0.0–7.0)
HCT: 37.1 % (ref 34.8–46.6)
HGB: 12.1 g/dL (ref 11.6–15.9)
LYMPH%: 22.4 % (ref 14.0–49.7)
MCH: 26.1 pg (ref 25.1–34.0)
MCHC: 32.6 g/dL (ref 31.5–36.0)
MCV: 80 fL (ref 79.5–101.0)
MONO#: 0.6 10*3/uL (ref 0.1–0.9)
MONO%: 7.1 % (ref 0.0–14.0)
NEUT#: 6 10*3/uL (ref 1.5–6.5)
NEUT%: 68 % (ref 38.4–76.8)
PLATELETS: 227 10*3/uL (ref 145–400)
RBC: 4.64 10*6/uL (ref 3.70–5.45)
RDW: 15.1 % — ABNORMAL HIGH (ref 11.2–14.5)
WBC: 8.8 10*3/uL (ref 3.9–10.3)
lymph#: 2 10*3/uL (ref 0.9–3.3)
nRBC: 0 % (ref 0–0)

## 2014-10-03 LAB — COMPREHENSIVE METABOLIC PANEL (CC13)
ALT: 16 U/L (ref 0–55)
ANION GAP: 11 meq/L (ref 3–11)
AST: 23 U/L (ref 5–34)
Albumin: 3.3 g/dL — ABNORMAL LOW (ref 3.5–5.0)
Alkaline Phosphatase: 109 U/L (ref 40–150)
BUN: 9.2 mg/dL (ref 7.0–26.0)
CO2: 24 meq/L (ref 22–29)
Calcium: 9.4 mg/dL (ref 8.4–10.4)
Chloride: 106 mEq/L (ref 98–109)
Creatinine: 1.3 mg/dL — ABNORMAL HIGH (ref 0.6–1.1)
EGFR: 54 mL/min/{1.73_m2} — AB (ref >90)
Glucose: 84 mg/dl (ref 70–140)
Potassium: 4.6 mEq/L (ref 3.5–5.1)
Sodium: 141 mEq/L (ref 136–145)
Total Bilirubin: 0.2 mg/dL (ref 0.20–1.20)
Total Protein: 7.3 g/dL (ref 6.4–8.3)

## 2014-10-03 LAB — MAGNESIUM (CC13): Magnesium: 2.3 mg/dl (ref 1.5–2.5)

## 2014-10-03 MED ORDER — DIPHENHYDRAMINE HCL 25 MG PO CAPS
ORAL_CAPSULE | ORAL | Status: AC
  Filled 2014-10-03: qty 1

## 2014-10-03 MED ORDER — SODIUM CHLORIDE 0.9 % IV SOLN
Freq: Once | INTRAVENOUS | Status: AC
  Administered 2014-10-03: 14:00:00 via INTRAVENOUS

## 2014-10-03 MED ORDER — DIPHENHYDRAMINE HCL 25 MG PO CAPS
25.0000 mg | ORAL_CAPSULE | Freq: Once | ORAL | Status: AC
  Administered 2014-10-03: 25 mg via ORAL

## 2014-10-03 MED ORDER — SODIUM CHLORIDE 0.9 % IJ SOLN
10.0000 mL | INTRAMUSCULAR | Status: DC | PRN
  Administered 2014-10-03: 10 mL via INTRAVENOUS
  Filled 2014-10-03: qty 10

## 2014-10-03 MED ORDER — ACETAMINOPHEN 325 MG PO TABS
ORAL_TABLET | ORAL | Status: AC
  Filled 2014-10-03: qty 2

## 2014-10-03 MED ORDER — ACETAMINOPHEN 325 MG PO TABS
650.0000 mg | ORAL_TABLET | Freq: Once | ORAL | Status: AC
  Administered 2014-10-03: 650 mg via ORAL

## 2014-10-03 MED ORDER — TRASTUZUMAB CHEMO INJECTION 440 MG
6.0000 mg/kg | Freq: Once | INTRAVENOUS | Status: AC
  Administered 2014-10-03: 420 mg via INTRAVENOUS
  Filled 2014-10-03: qty 20

## 2014-10-03 NOTE — Progress Notes (Signed)
Patient in for Port-A-Cath access and labs. Cindi Carbon, RN and this nurse unable to access port. Patient had right breast implant surgery with Lipofilling on 09/26/2014. All labs drawn by [place], Phlebotomist today.

## 2014-10-03 NOTE — Progress Notes (Signed)
[place]  [phone]) [phone] [phone]) [phone]     ID: Angela Gross OB: 05-15-1951  MR#: [id number]  Angela Gross  PCP: No primary care provider on file. GYN:   SU: Angela Gross OTHER MD: [name], Claire Sanger, Arnoldo Hooker Th[place]mappa  CHIEF COMPLAINT:  Right Breast Cancer, triple positive  CURRENT TREATMENT:  Trastuzumab/, anastrozole  HISTORY OF PRESENT ILLNESS: From the original intake note:  Angela Gross had routine screening mammography at the breast center 09/21/2013 showing a possible mass in calcifications in the right breast. On 10/11/2013 she underwent right diagnostic mammography and ultrasonography. This confirmed a spiculated 1.2 cm mass in the central right breast, with a group of pleomorphic calcifications slightly laterally. The calcifications spanning 1.6 cm. A second group of calcifications extended inferiorly and spanned 8 mm. The entire area in aggregate measured 6.5 cm. By physical exam there was no palpable abnormality. Ultrasonography showed an irregular hypoechoic mass at the 6:00 position of the right breast measuring 1 cm maximally. The right axilla was normal.  Biopsy of the right breast mass in question as well as the more lateral area of calcifications on 10/11/2013 showed ([id number]) most to be positive for an invasive ductal carcinoma, both estrogen receptor 85-90% positive with strong staining intensity, both progesterone receptor negative, with an MIB-1 of 17%. HER-2 was amplified, with a signals ratio of 3.44 and a copy number per cell 04 0.30.  Bilateral breast MRI 10/19/2013 showed again a spiculated mass in the 6:00 region of the right breast measuring 1.2 cm, a slightly more lateral hematoma associated with a second biopsy and also with clumped enhancement, and asymmetric none masslike enhancement in most of the right breast, the entire area of abnormality measuring 9.0 cm. The left breast was unremarkable and there were no  abnormal appearing lymph nodes.  Her subsequent history is as detailed below   INTERVAL HISTORY: Angela Gross returns for follow up of her breast cancer. She is due for trastuzumab today. She also continues on anastrozole daily. The interval history is significant for the removal of her right breast expander and replacement with silicone implants, lipofilling to the right chest, and a left breast mastopexy for symmetry. She still has a drain to the right chest but is otherwise healing nicely.  She is on oxycodone for pain and didn't know that it was ok to continue her 310m gabapentin QHS, so her hot flashes have returned as well as the numbness to her feet.   REVIEW OF SYSTEMS: LBailiedenies fevers, chills, nausea, or vomiting. She continues to be constipated but has not started using her stool softeners. Her appetite is fair, and she drinks lots of fluids but they tend to be sodas or kool-aid most frequently.  LDonniceis occasionally short of breath with exertion, but denies chest pain, palpitations, cough, or fatigue. A detailed review of systems is otherwise negative.   PAST MEDICAL HISTORY: Past Medical History  Diagnosis Date  . Raynaud disease   . Osteoporosis   . Hypertension   . PONV (postoperative nausea and vomiting)   . Wears glasses   . Full dentures   . Fibromyalgia   . Rheumatoid arthritis     "elbows; fingers; toes"  . Osteoarthritis of both knees   . Degenerative disc disease, cervical   . Chronic left shoulder pain     "work related injury" (01/10/2014)  . Breast cancer     "right" (01/10/2014)  . Type II diabetes mellitus     has not  taken meds in over a month (01/10/2014)  . Syncope and collapse     "4 times in the last year" (01/10/2014)    PAST SURGICAL HISTORY: Past Surgical History  Procedure Laterality Date  . Colonoscopy    . Mastectomy w/ sentinel node biopsy Right 12/13/2013    Procedure: RIGHT TOTAL MASTECTOMY WITH SENTINEL LYMPH NODE BIOPSY;  Surgeon:  Adin Hector, MD;  Location: [facility];  Service: General;  Laterality: Right;  . Portacath placement Right 12/13/2013    Procedure: INSERTION PORT-A-CATH;  Surgeon: Adin Hector, MD;  Location: [facility];  Service: General;  Laterality: Right;  . Breast reconstruction with placement of tissue expander and flex hd (acellular hydrated dermis) Right 12/13/2013    Procedure: RIGHT BREAST RECONSTRUCTION WITH PLACEMENT OF TISSUE EXPANDER AND FLEX HD (ACELLULAR HYDRATED DERMIS);  Surgeon: Irene Limbo, MD;  Location: [facility];  Service: Plastics;  Laterality: Right;  . Lipoma excision Bilateral 12/13/2013    Procedure: EXCISION OF LEFT BREAST KELOID AND RIGHT CHEST KELOID;  Surgeon: Irene Limbo, MD;  Location: [facility];  Service: Plastics;  Laterality: Bilateral;  . Mastectomy    . Cholecystectomy  1970's  . Vaginal hysterectomy  1980    no salpingo-oophoretomu  . Breast biopsy Right 10/2013  . Tissue expander placement Right 01/10/2014    Procedure: Incision and Drainage Right Breast Seroma with TISSUE EXPANDER exchange;  Surgeon: Irene Limbo, MD;  Location: [facility];  Service: Plastics;  Laterality: Right;  . Removal of bilateral tissue expanders with placement of bilateral breast implants Bilateral 09/26/2014    Procedure: REMOVAL OF RIGHT TISSUE EXPANDER WITH PLACEMENT OF RIGHT BREAST IMPLANT LIPOFILLING TO RIGHT BREAST/LEFT BREAST MASTOPEXY FOR SYMMETRY;  Surgeon: Irene Limbo, MD;  Location: [facility];  Service: Plastics;  Laterality: Bilateral;  . Liposuction with lipofilling Right 09/26/2014    Procedure: LIPOSUCTION WITH LIPOFILLING;  Surgeon: Irene Limbo, MD;  Location: [facility];  Service: Plastics;  Laterality: Right;  . Mastopexy Left 09/26/2014    Procedure: MASTOPEXY;  Surgeon: Irene Limbo, MD;  Location: [facility];  Service: Plastics;  Laterality:  Left;    FAMILY HISTORY No family history on file. The patient is little information about her father. Her mother died from a heart attack at the age of 12. She had been diagnosed with breast cancer in her late 78s. The patient had 4 brothers and 4 sisters. There is no other history of breast or ovarian cancer in the family to her knowledge.  GYNECOLOGIC HISTORY:    (Reviewed 04/04/2014) She does not recall her age of menarche. She underwent hysterectomy in her 65s. She did not receive hormone replacement. First live birth at 61. She was GX P2.  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 She is currently disabled secondary to her rheumatoid arthritis. Her husband Starlit Raburn is a retired Administrator. He now works part time at [employer]. Son [name] works in [place] as a Patent attorney. Daughter [name] is a Social worker.    ADVANCED DIRECTIVES: Not in place   HEALTH MAINTENANCE: (Updated 04/04/2014) History  Substance Use Topics  . Smoking status: Never Smoker   . Smokeless tobacco: Never Used  . Alcohol Use: No     Colonoscopy: Not on file  PAP: Status post remote hysterectomy  [facility] with a T score of -1.7  Lipid panel: Not on file   Allergies  Allergen Reactions  . Codeine Other (See Comments) and Hives    Altered mental status, numbness  Altered mental status, Numbess  . Diazepam     Other reaction(s): Delusions (intolerance) hallucinations     Current Outpatient Prescriptions  Medication Sig Dispense Refill  . anastrozole (ARIMIDEX) 1 MG tablet Take 1 tablet (1 mg total) by mouth daily. 30 tablet 5  . carvedilol (COREG) 12.5 MG tablet Take 1 tablet (12.5 mg total) by mouth 2 (two) times daily with a meal. 60 tablet 3  . gabapentin (NEURONTIN) 300 MG capsule Take 1 capsule (300 mg total) by  mouth 2 (two) times daily. For neuropathy and hot flashes (Patient taking differently: Take 300 mg by mouth 3 (three) times daily as needed. For neuropathy and hot flashes) 60 capsule 5  . lidocaine-prilocaine (EMLA) cream Apply 1 application topically as needed. 1-2 hrs before port access 30 g 4  . lisinopril (PRINIVIL,ZESTRIL) 10 MG tablet Take 1 tablet (10 mg total) by mouth daily. 90 tablet 3  . oxyCODONE-acetaminophen (ROXICET) 5-325 MG per tablet Take 1-2 tablets by mouth every 4 (four) hours as needed for severe pain. 40 tablet 0  . sulfamethoxazole-trimethoprim (BACTRIM DS,SEPTRA DS) 800-160 MG per tablet Take 1 tablet by mouth 2 (two) times daily. 12 tablet 0  . cyclobenzaprine (FLEXERIL) 10 MG tablet Take 1 tablet (10 mg total) by mouth 3 (three) times daily as needed for muscle spasms. (Patient not taking: Reported on 10/03/2014) 30 tablet 1  . traMADol (ULTRAM) 50 MG tablet Take 1 tablet (50 mg total) by mouth 3 (three) times daily as needed (pain). (Patient not taking: Reported on 10/03/2014) 90 tablet 1   No current facility-administered medications for this visit.   Facility-Administered Medications Ordered in Other Visits  Medication Dose Route Frequency Provider Last Rate Last Dose  . sodium chloride 0.9 % injection 10 mL  10 mL Intravenous PRN Amy G Berry, PA-C   10 mL at 03/01/14 1425  . sodium chloride 0.9 % injection 10 mL  10 mL Intravenous PRN Chauncey Cruel, MD   10 mL at 03/01/14 0160    OBJECTIVE:  Middle-aged African-American woman who appears stated age 44 Vitals:   10/03/14 1202  BP: 135/73  Pulse: 81  Temp: 98.5 F (36.9 C)  Resp: 18     Body mass index is 29.69 kg/(m^2).    ECOG FS: 1  Filed Weights   10/03/14 1202  Weight: 152 lb (68.947 kg)   Sclerae unicteric, pupils equal and reactive Oropharynx clear and moist-- no thrush No cervical or supraclavicular adenopathy Lungs no rales or rhonchi Heart regular rate and rhythm Abd soft, nontender,  positive bowel sounds MSK no focal spinal tenderness, no upper extremity lymphedema Neuro: nonfocal, well oriented, appropriate affect Breasts: right breast status post mastectomy and recent implant, healing well. Incision well approximated. Right axilla benign. Left breast status post mastopexy.  LAB RESULTS:   Lab Results  Component Value Date   WBC 8.8 10/03/2014   NEUTROABS 6.0 10/03/2014   HGB 12.1 10/03/2014   HCT 37.1 10/03/2014   MCV 80.0 10/03/2014   PLT 227 10/03/2014      Chemistry      Component Value Date/Time   NA 141 10/03/2014 1108   NA 138 01/09/2014 1522   K 4.6 10/03/2014 1108   K 4.2 01/09/2014 1522  CL 100 01/09/2014 1522   CO2 24 10/03/2014 1108   CO2 25 01/09/2014 1522   BUN 9.2 10/03/2014 1108   BUN 11 01/09/2014 1522   CREATININE 1.3* 10/03/2014 1108   CREATININE 0.99 01/09/2014 1522      Component Value Date/Time   CALCIUM 9.4 10/03/2014 1108   CALCIUM 8.5 01/09/2014 1522   ALKPHOS 109 10/03/2014 1108   ALKPHOS 102 12/09/2013 1400   AST 23 10/03/2014 1108   AST 36 12/09/2013 1400   ALT 16 10/03/2014 1108   ALT 30 12/09/2013 1400   BILITOT 0.20 10/03/2014 1108   BILITOT 0.2* 12/09/2013 1400     STUDIES: Most recent echocardiogram on 08/24/14 showed an improvement in ejection fraction to 55-60%  ASSESSMENT: 63 y.o. [place] woman   (1)  s/p biopsy of separate Right breast masses 10/11/2013 for a clinical mT1 N0, stage IA invasive ductal carcinoma, grade II-III, one of the mass is being 85% estrogen receptor positive, progesterone receptor negative, with an MIB-1 of 17% and HER-2 amplified.  (2) status post right mastectomy and sentinel lymph node sampling with immediate expander placement 12/13/2013 for a pT1c pN0, stage IA invasive ductal carcinoma, grade 3, again HER-2 positive.  (3) treated in the adjuvant setting with carboplatin, docetaxel, trastuzumab and pertuzumab, first dose on 02/21/2014   (a). Patient developed significant  peripheral neuropathy after one cycle of chemotherapy, and both carboplatin and docetaxel were held beginning with cycle 2. Continued pertuzumab/ trastuzumab x 5 more cycles, completed 08/01/2014  (b)  single agent trastuzumab to be started 08/29/2014, to be continued every 3 weeks until May 2016  (d) most recent echocardiogram 07/10/2014 showed an ejection fraction in the 50-55% range.  (4) started on anastrozole on 04/04/2014 .   (5) Rheumatoid arthritis,  on Remicade, prednisone and methotrexate  (6) chemotherapy-induced peripheral neuropathy  - being treated with gabapentin, 300 BID, and tramadol, 50 mg every 6 hours as needed.     PLAN: Maralyn is doing well today. The labs were reviewed in detail and were relatively stable. Her creatinine has jumped up to 1.3. We will proceed with trastuzumab today. I have encouraged Ivalee to avoid sugar filled beverages and drink more water. She will also give though to regular stool softeners and restart her gabapentin QHS for her hot flashes and neuropathy.  The nurses were unable to access her port in the lab as it has shifted from the swelling to her chest. They will place an IV peripherally for today's infusion.   Markeia will have an echocardiogram in January and continue with trastuzumab every 3 weeks. She understands and agrees with this plan. She knows the goal of treatment in her case is cure. She has been encouraged to call with any issues that might arise before her next visit here.    Marcelino Duster, NP  10/03/2014 1:23 PM

## 2014-10-03 NOTE — Telephone Encounter (Signed)
perpof to sch pt appt-sent MW emailt o sch trmt-pt in trmt room to get updated coyp b4leaving-per HF

## 2014-10-03 NOTE — Patient Instructions (Signed)
[facility] Discharge Instructions for Patients Receiving Chemotherapy  Today you received the following chemotherapy agents:  Herceptin  To help prevent nausea and vomiting after your treatment, we encourage you to take your nausea medication as ordered per MD.   If you develop nausea and vomiting that is not controlled by your nausea medication, call the clinic.   BELOW ARE SYMPTOMS THAT SHOULD BE REPORTED IMMEDIATELY:  *FEVER GREATER THAN 100.5 F  *CHILLS WITH OR WITHOUT FEVER  NAUSEA AND VOMITING THAT IS NOT CONTROLLED WITH YOUR NAUSEA MEDICATION  *UNUSUAL SHORTNESS OF BREATH  *UNUSUAL BRUISING OR BLEEDING  TENDERNESS IN MOUTH AND THROAT WITH OR WITHOUT PRESENCE OF ULCERS  *URINARY PROBLEMS  *BOWEL PROBLEMS  UNUSUAL RASH Items with * indicate a potential emergency and should be followed up as soon as possible.  Feel free to call the clinic you have any questions or concerns. The clinic phone number is [phone].    

## 2014-10-04 ENCOUNTER — Telehealth: Payer: Self-pay | Admitting: *Deleted

## 2014-10-04 ENCOUNTER — Telehealth: Payer: Self-pay | Admitting: Nurse Practitioner

## 2014-10-04 ENCOUNTER — Encounter: Payer: Self-pay | Admitting: Nurse Practitioner

## 2014-10-04 DIAGNOSIS — K5900 Constipation, unspecified: Secondary | ICD-10-CM | POA: Insufficient documentation

## 2014-10-04 HISTORY — DX: Constipation, unspecified: K59.00

## 2014-10-04 NOTE — Telephone Encounter (Signed)
per pof to sch pt appt-cld & left pt message to adv HF apprvd moveing trmt from 1/12 to 1/13-

## 2014-10-04 NOTE — Telephone Encounter (Signed)
Per staff message and POF I have scheduled appts. Advised scheduler of appts. JMW  

## 2014-10-09 ENCOUNTER — Other Ambulatory Visit: Payer: Self-pay | Admitting: *Deleted

## 2014-10-09 DIAGNOSIS — C50311 Malignant neoplasm of lower-inner quadrant of right female breast: Secondary | ICD-10-CM

## 2014-10-09 MED ORDER — ANASTROZOLE 1 MG PO TABS: 1.0000 mg | ORAL_TABLET | Freq: Every day | ORAL | Status: DC

## 2014-10-24 ENCOUNTER — Ambulatory Visit: Payer: Medicare Other | Admitting: Nurse Practitioner

## 2014-10-24 ENCOUNTER — Ambulatory Visit: Payer: Medicare Other

## 2014-10-24 ENCOUNTER — Other Ambulatory Visit: Payer: Medicare Other

## 2014-10-25 ENCOUNTER — Other Ambulatory Visit (HOSPITAL_BASED_OUTPATIENT_CLINIC_OR_DEPARTMENT_OTHER): Payer: Medicare HMO

## 2014-10-25 ENCOUNTER — Encounter: Payer: Self-pay | Admitting: Nurse Practitioner

## 2014-10-25 ENCOUNTER — Ambulatory Visit (HOSPITAL_BASED_OUTPATIENT_CLINIC_OR_DEPARTMENT_OTHER): Payer: Medicare HMO

## 2014-10-25 ENCOUNTER — Ambulatory Visit (HOSPITAL_BASED_OUTPATIENT_CLINIC_OR_DEPARTMENT_OTHER): Payer: Medicare HMO | Admitting: Nurse Practitioner

## 2014-10-25 ENCOUNTER — Ambulatory Visit: Payer: Medicare HMO

## 2014-10-25 VITALS — BP 136/78 | HR 74 | Temp 97.8°F | Resp 18 | Wt 156.3 lb

## 2014-10-25 DIAGNOSIS — T451X5A Adverse effect of antineoplastic and immunosuppressive drugs, initial encounter: Secondary | ICD-10-CM

## 2014-10-25 DIAGNOSIS — M069 Rheumatoid arthritis, unspecified: Secondary | ICD-10-CM

## 2014-10-25 DIAGNOSIS — I427 Cardiomyopathy due to drug and external agent: Secondary | ICD-10-CM

## 2014-10-25 DIAGNOSIS — G620 Drug-induced polyneuropathy: Secondary | ICD-10-CM

## 2014-10-25 DIAGNOSIS — C50311 Malignant neoplasm of lower-inner quadrant of right female breast: Secondary | ICD-10-CM

## 2014-10-25 DIAGNOSIS — Z95828 Presence of other vascular implants and grafts: Secondary | ICD-10-CM

## 2014-10-25 DIAGNOSIS — C50811 Malignant neoplasm of overlapping sites of right female breast: Secondary | ICD-10-CM

## 2014-10-25 DIAGNOSIS — M2550 Pain in unspecified joint: Secondary | ICD-10-CM

## 2014-10-25 DIAGNOSIS — Z5112 Encounter for antineoplastic immunotherapy: Secondary | ICD-10-CM

## 2014-10-25 LAB — CBC WITH DIFFERENTIAL/PLATELET
BASO%: 0.4 % (ref 0.0–2.0)
BASOS ABS: 0 10*3/uL (ref 0.0–0.1)
EOS ABS: 0.3 10*3/uL (ref 0.0–0.5)
EOS%: 3.8 % (ref 0.0–7.0)
HCT: 36.1 % (ref 34.8–46.6)
HGB: 11.3 g/dL — ABNORMAL LOW (ref 11.6–15.9)
LYMPH%: 29.1 % (ref 14.0–49.7)
MCH: 25.7 pg (ref 25.1–34.0)
MCHC: 31.3 g/dL — ABNORMAL LOW (ref 31.5–36.0)
MCV: 82.2 fL (ref 79.5–101.0)
MONO#: 0.6 10*3/uL (ref 0.1–0.9)
MONO%: 8.4 % (ref 0.0–14.0)
NEUT#: 4.3 10*3/uL (ref 1.5–6.5)
NEUT%: 58.3 % (ref 38.4–76.8)
Platelets: 172 10*3/uL (ref 145–400)
RBC: 4.39 10*6/uL (ref 3.70–5.45)
RDW: 15.5 % — AB (ref 11.2–14.5)
WBC: 7.3 10*3/uL (ref 3.9–10.3)
lymph#: 2.1 10*3/uL (ref 0.9–3.3)

## 2014-10-25 LAB — COMPREHENSIVE METABOLIC PANEL (CC13)
ALBUMIN: 3.3 g/dL — AB (ref 3.5–5.0)
ALT: 8 U/L (ref 0–55)
AST: 15 U/L (ref 5–34)
Alkaline Phosphatase: 98 U/L (ref 40–150)
Anion Gap: 10 mEq/L (ref 3–11)
BILIRUBIN TOTAL: 0.28 mg/dL (ref 0.20–1.20)
BUN: 11.4 mg/dL (ref 7.0–26.0)
CO2: 24 mEq/L (ref 22–29)
Calcium: 8.4 mg/dL (ref 8.4–10.4)
Chloride: 107 mEq/L (ref 98–109)
Creatinine: 0.9 mg/dL (ref 0.6–1.1)
EGFR: 78 mL/min/{1.73_m2} — ABNORMAL LOW (ref >90)
Glucose: 144 mg/dl — ABNORMAL HIGH (ref 70–140)
Potassium: 3.5 mEq/L (ref 3.5–5.1)
Sodium: 142 mEq/L (ref 136–145)
Total Protein: 6.8 g/dL (ref 6.4–8.3)

## 2014-10-25 LAB — MAGNESIUM (CC13): MAGNESIUM: 2.1 mg/dL (ref 1.5–2.5)

## 2014-10-25 MED ORDER — HEPARIN SOD (PORK) LOCK FLUSH 100 UNIT/ML IV SOLN
500.0000 [IU] | Freq: Once | INTRAVENOUS | Status: AC | PRN
  Administered 2014-10-25: 500 [IU]
  Filled 2014-10-25: qty 5

## 2014-10-25 MED ORDER — DIPHENHYDRAMINE HCL 25 MG PO CAPS
ORAL_CAPSULE | ORAL | Status: AC
  Filled 2014-10-25: qty 1

## 2014-10-25 MED ORDER — ACETAMINOPHEN 325 MG PO TABS
ORAL_TABLET | ORAL | Status: AC
  Filled 2014-10-25: qty 2

## 2014-10-25 MED ORDER — TRASTUZUMAB CHEMO INJECTION 440 MG
6.0000 mg/kg | Freq: Once | INTRAVENOUS | Status: AC
  Administered 2014-10-25: 420 mg via INTRAVENOUS
  Filled 2014-10-25: qty 20

## 2014-10-25 MED ORDER — ACETAMINOPHEN 325 MG PO TABS
650.0000 mg | ORAL_TABLET | Freq: Once | ORAL | Status: AC
  Administered 2014-10-25: 650 mg via ORAL

## 2014-10-25 MED ORDER — SODIUM CHLORIDE 0.9 % IJ SOLN
10.0000 mL | INTRAMUSCULAR | Status: DC | PRN
  Administered 2014-10-25: 10 mL
  Filled 2014-10-25: qty 10

## 2014-10-25 MED ORDER — SODIUM CHLORIDE 0.9 % IV SOLN
Freq: Once | INTRAVENOUS | Status: AC
  Administered 2014-10-25: 12:00:00 via INTRAVENOUS

## 2014-10-25 MED ORDER — SODIUM CHLORIDE 0.9 % IJ SOLN
10.0000 mL | INTRAMUSCULAR | Status: DC | PRN
  Administered 2014-10-25: 10 mL via INTRAVENOUS
  Filled 2014-10-25: qty 10

## 2014-10-25 MED ORDER — DIPHENHYDRAMINE HCL 25 MG PO CAPS
25.0000 mg | ORAL_CAPSULE | Freq: Once | ORAL | Status: AC
  Administered 2014-10-25: 25 mg via ORAL

## 2014-10-25 NOTE — Patient Instructions (Signed)
[facility] Discharge Instructions for Patients Receiving Chemotherapy  Today you received the following chemotherapy agents:  Herceptin  To help prevent nausea and vomiting after your treatment, we encourage you to take your nausea medication as ordered per MD.   If you develop nausea and vomiting that is not controlled by your nausea medication, call the clinic.   BELOW ARE SYMPTOMS THAT SHOULD BE REPORTED IMMEDIATELY:  *FEVER GREATER THAN 100.5 F  *CHILLS WITH OR WITHOUT FEVER  NAUSEA AND VOMITING THAT IS NOT CONTROLLED WITH YOUR NAUSEA MEDICATION  *UNUSUAL SHORTNESS OF BREATH  *UNUSUAL BRUISING OR BLEEDING  TENDERNESS IN MOUTH AND THROAT WITH OR WITHOUT PRESENCE OF ULCERS  *URINARY PROBLEMS  *BOWEL PROBLEMS  UNUSUAL RASH Items with * indicate a potential emergency and should be followed up as soon as possible.  Feel free to call the clinic you have any questions or concerns. The clinic phone number is [phone].    

## 2014-10-25 NOTE — Patient Instructions (Signed)

## 2014-10-25 NOTE — Progress Notes (Signed)
[name]  [phone] [phone]     ID: Angela Gross OB: [birth year]  MR#: [id number]  FBP#:[id number]  PCP: Angela Gross., MD GYN:   Angela Gross OTHER MD: Angela Gross, Angela Gross, Angela Gross  CHIEF COMPLAINT:  Right Breast Cancer, triple positive CURRENT TREATMENT:  Trastuzumab/, anastrozole  HISTORY OF PRESENT ILLNESS: From the original intake note:  Angela Gross had routine screening mammography at the breast center 09/21/2013 showing a possible mass in calcifications in the right breast. On 10/11/2013 she underwent right diagnostic mammography and ultrasonography. This confirmed a spiculated 1.2 cm mass in the central right breast, with a group of pleomorphic calcifications slightly laterally. The calcifications spanning 1.6 cm. A second group of calcifications extended inferiorly and spanned 8 mm. The entire area in aggregate measured 6.5 cm. By physical exam there was no palpable abnormality. Ultrasonography showed an irregular hypoechoic mass at the 6:00 position of the right breast measuring 1 cm maximally. The right axilla was normal.  Biopsy of the right breast mass in question as well as the more lateral area of calcifications on 10/11/2013 showed ([id number]) most to be positive for an invasive ductal carcinoma, both estrogen receptor 85-90% positive with strong staining intensity, both progesterone receptor negative, with an MIB-1 of 17%. HER-2 was amplified, with a signals ratio of 3.44 and a copy number per cell 04 0.30.  Bilateral breast MRI 10/19/2013 showed again a spiculated mass in the 6:00 region of the right breast measuring 1.2 cm, a slightly more lateral hematoma associated with a second biopsy and also with clumped enhancement, and asymmetric none masslike enhancement in most of the right breast, the entire area of abnormality measuring 9.0 cm. The left breast was unremarkable and there were no abnormal  appearing lymph nodes.  Her subsequent history is as detailed below   INTERVAL HISTORY: Angela Gross returns for follow up of her breast cancer. She is due for trastuzumab today. She also continues on anastrozole daily. She is tolerating both drugs well with no complaints. She has started to have increased neuropathy symptoms, which is a residual from chemotherapy. She started being more diligent with her gabapentin 367m BID, but this is only moderately helpful. She does have a history of Raynaud's phenomenon and it has been exceptionally cold this week so this could be the reason for the increased pain to her lower extremities.    REVIEW OF SYSTEMS: Angela Gross fevers, chills, nausea, or vomiting. She is moving her bowels well as the constipation has resolved. Her appetite is fair, and she drinks lots of fluids but they tend to be sodas or kool-aid most frequently. Angela Gross occasionally short of breath with exertion, but denies chest pain, palpitations, cough, or fatigue. She has good nights and bad nights as far as sleep is concerned. A detailed review of systems is otherwise negative.   PAST MEDICAL HISTORY: Past Medical History  Diagnosis Date  . Raynaud disease   . Osteoporosis   . Hypertension   . PONV (postoperative nausea and vomiting)   . Wears glasses   . Full dentures   . Fibromyalgia   . Rheumatoid arthritis     "elbows; fingers; toes"  . Osteoarthritis of both knees   . Degenerative disc disease, cervical   . Chronic left shoulder pain     "work related injury" (01/10/2014)  . Breast cancer     "right" (01/10/2014)  . Type II diabetes mellitus     has not taken meds  in over a month (01/10/2014)  . Syncope and collapse     "4 times in the last year" (01/10/2014)    PAST SURGICAL HISTORY: Past Surgical History  Procedure Laterality Date  . Colonoscopy    . Mastectomy w/ sentinel node biopsy Right 12/13/2013    Procedure: RIGHT TOTAL MASTECTOMY WITH SENTINEL LYMPH NODE  BIOPSY;  Surgeon: Adin Hector, MD;  Location: [facility];  Service: General;  Laterality: Right;  . Portacath placement Right 12/13/2013    Procedure: INSERTION PORT-A-CATH;  Surgeon: Adin Hector, MD;  Location: [facility];  Service: General;  Laterality: Right;  . Breast reconstruction with placement of tissue expander and flex hd (acellular hydrated dermis) Right 12/13/2013    Procedure: RIGHT BREAST RECONSTRUCTION WITH PLACEMENT OF TISSUE EXPANDER AND FLEX HD (ACELLULAR HYDRATED DERMIS);  Surgeon: Irene Limbo, MD;  Location: [facility];  Service: Plastics;  Laterality: Right;  . Lipoma excision Bilateral 12/13/2013    Procedure: EXCISION OF LEFT BREAST KELOID AND RIGHT CHEST KELOID;  Surgeon: Irene Limbo, MD;  Location: [facility];  Service: Plastics;  Laterality: Bilateral;  . Mastectomy    . Cholecystectomy  1970's  . Vaginal hysterectomy  1980    no salpingo-oophoretomu  . Breast biopsy Right 10/2013  . Tissue expander placement Right 01/10/2014    Procedure: Incision and Drainage Right Breast Seroma with TISSUE EXPANDER exchange;  Surgeon: Irene Limbo, MD;  Location: [facility];  Service: Plastics;  Laterality: Right;  . Removal of bilateral tissue expanders with placement of bilateral breast implants Bilateral 09/26/2014    Procedure: REMOVAL OF RIGHT TISSUE EXPANDER WITH PLACEMENT OF RIGHT BREAST IMPLANT LIPOFILLING TO RIGHT BREAST/LEFT BREAST MASTOPEXY FOR SYMMETRY;  Surgeon: Irene Limbo, MD;  Location: [facility];  Service: Plastics;  Laterality: Bilateral;  . Liposuction with lipofilling Right 09/26/2014    Procedure: LIPOSUCTION WITH LIPOFILLING;  Surgeon: Irene Limbo, MD;  Location: [facility];  Service: Plastics;  Laterality: Right;  . Mastopexy Left 09/26/2014    Procedure: MASTOPEXY;  Surgeon: Irene Limbo, MD;  Location: [facility];  Service:  Plastics;  Laterality: Left;    FAMILY HISTORY No family history on file. The patient is little information about her father. Her mother died from a heart attack at the age of 25. She had been diagnosed with breast cancer in her late 25s. The patient had 4 brothers and 4 sisters. There is no other history of breast or ovarian cancer in the family to her knowledge.  GYNECOLOGIC HISTORY:    (Reviewed 04/04/2014) She does not recall her age of menarche. She underwent hysterectomy in her 12s. She did not receive hormone replacement. First live birth at 52. She was GX P2.  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 She is currently disabled secondary to her rheumatoid arthritis. Her husband Almena Hokenson is a retired Administrator. He now works part time at [employer]. Son [name] works in [place] as a Patent attorney. Daughter [name] is a Social worker.    ADVANCED DIRECTIVES: Not in place   HEALTH MAINTENANCE: (Updated 04/04/2014) History  Substance Use Topics  . Smoking status: Never Smoker   . Smokeless tobacco: Never Used  . Alcohol Use: No     Colonoscopy: Not on file  PAP: Status post remote hysterectomy  Bone density: At [facility] hospital  01/05/2009 showed osteopenia with a T score of -1.7  Lipid panel: Not on file   Allergies  Allergen Reactions  . Codeine Other (See Comments) and Hives    Altered mental status, numbness  Altered mental status, Numbess  . Diazepam     Other reaction(s): Delusions (intolerance) hallucinations     Current Outpatient Prescriptions  Medication Sig Dispense Refill  . anastrozole (ARIMIDEX) 1 MG tablet Take 1 tablet (1 mg total) by mouth daily. 30 tablet 12  . carvedilol (COREG) 12.5 MG tablet Take 1 tablet (12.5 mg total) by mouth 2 (two) times daily with a meal. 60 tablet 3  . cyclobenzaprine (FLEXERIL) 10 MG tablet Take 1  tablet (10 mg total) by mouth 3 (three) times daily as needed for muscle spasms. 30 tablet 1  . gabapentin (NEURONTIN) 300 MG capsule Take 1 capsule (300 mg total) by mouth 2 (two) times daily. For neuropathy and hot flashes (Patient taking differently: Take 300 mg by mouth 3 (three) times daily as needed. For neuropathy and hot flashes) 60 capsule 5  . lidocaine-prilocaine (EMLA) cream Apply 1 application topically as needed. 1-2 hrs before port access 30 g 4  . lisinopril (PRINIVIL,ZESTRIL) 10 MG tablet Take 1 tablet (10 mg total) by mouth daily. 90 tablet 3  . oxyCODONE (OXY IR/ROXICODONE) 5 MG immediate release tablet Take 5 mg by mouth.    . traMADol (ULTRAM) 50 MG tablet Take 1 tablet (50 mg total) by mouth 3 (three) times daily as needed (pain). (Patient not taking: Reported on 10/03/2014) 90 tablet 1   No current facility-administered medications for this visit.   Facility-Administered Medications Ordered in Other Visits  Medication Dose Route Frequency Provider Last Rate Last Dose  . sodium chloride 0.9 % injection 10 mL  10 mL Intracatheter PRN Chauncey Cruel, MD   10 mL at 10/25/14 1222    OBJECTIVE:  Middle-aged African-American woman who appears stated age 64 Vitals:   10/25/14 1036  BP: 136/78  Pulse: 74  Temp: 97.8 F (36.6 C)  Resp: 18     Body mass index is 30.53 kg/(m^2).    ECOG FS: 1  Filed Weights   10/25/14 1036  Weight: 156 lb 4.8 oz (70.897 kg)    Skin: warm, dry  HEENT: sclerae anicteric, conjunctivae pink, oropharynx clear. No thrush or mucositis.  Lymph Nodes: No cervical or supraclavicular lymphadenopathy  Lungs: clear to auscultation bilaterally, no rales, wheezes, or rhonci  Heart: regular rate and rhythm  Abdomen: round, soft, non tender, positive bowel sounds  Musculoskeletal: No focal spinal tenderness, no peripheral edema  Neuro: non focal, well oriented, positive affect  Breasts: deferred  LAB RESULTS:   Lab Results  Component Value  Date   WBC 7.3 10/25/2014   NEUTROABS 4.3 10/25/2014   HGB 11.3* 10/25/2014   HCT 36.1 10/25/2014   MCV 82.2 10/25/2014   PLT 172 10/25/2014      Chemistry      Component Value Date/Time   NA 142 10/25/2014 0949   NA 138 01/09/2014 1522   K 3.5 10/25/2014 0949   K 4.2 01/09/2014 1522   CL 100 01/09/2014 1522   CO2 24 10/25/2014 0949   CO2 25 01/09/2014 1522   BUN 11.4 10/25/2014 0949   BUN 11 01/09/2014 1522   CREATININE 0.9 10/25/2014 0949   CREATININE 0.99 01/09/2014 1522      Component Value Date/Time   CALCIUM 8.4 10/25/2014 0949   CALCIUM 8.5 01/09/2014 1522  ALKPHOS 98 10/25/2014 0949   ALKPHOS 102 12/09/2013 1400   AST 15 10/25/2014 0949   AST 36 12/09/2013 1400   ALT 8 10/25/2014 0949   ALT 30 12/09/2013 1400   BILITOT 0.28 10/25/2014 0949   BILITOT 0.2* 12/09/2013 1400     STUDIES: Most recent echocardiogram on 08/24/14 showed an improvement in ejection fraction to 55-60%  ASSESSMENT: 63 y.o. [place] woman   (1)  s/p biopsy of separate Right breast masses 10/11/2013 for a clinical mT1 N0, stage IA invasive ductal carcinoma, grade II-III, one of the mass is being 85% estrogen receptor positive, progesterone receptor negative, with an MIB-1 of 17% and HER-2 amplified.  (2) status post right mastectomy and sentinel lymph node sampling with immediate expander placement 12/13/2013 for a pT1c pN0, stage IA invasive ductal carcinoma, grade 3, again HER-2 positive.  (3) treated in the adjuvant setting with carboplatin, docetaxel, trastuzumab and pertuzumab, first dose on 02/21/2014   (a). Patient developed significant peripheral neuropathy after one cycle of chemotherapy, and both carboplatin and docetaxel were held beginning with cycle 2. Continued pertuzumab/ trastuzumab x 5 more cycles, completed 08/01/2014  (b)  single agent trastuzumab to be started 08/29/2014, to be continued every 3 weeks until May 2016  (d) most recent echocardiogram 07/10/2014 showed an  ejection fraction in the 50-55% range.  (4) started on anastrozole on 04/04/2014 .   (5) Rheumatoid arthritis,  on Remicade, prednisone and methotrexate  (6) chemotherapy-induced peripheral neuropathy  - being treated with gabapentin, 300 BID, and tramadol, 50 mg every 6 hours as needed.     PLAN: Angela Gross is doing well today. The labs were reviewed in detail and were entirely stable. She will continue anastrozole daily and  proceed with trastuzumab as scheduled today. She was supposed to have a repeat echo performed yesterday, but there was some confusion and it was moved to 1/26 instead.  For her neuropathy I suggested she continue the 343m BID, but we also discussed TID dosing. She will try this for the next few weeks to see if it makes a difference, if not we can figure that it is probably her Raynaud's getting the best of her.   LRielywill continue with trastuzumab every 3 weeks as advised by her cardiologist. She understands and agrees with this plan. She knows the goal of treatment in her case is cure. She has been encouraged to call with any issues that might arise before her next visit here.    Angela Duster NP  10/25/2014 1:57 PM

## 2014-10-26 ENCOUNTER — Encounter (HOSPITAL_COMMUNITY): Payer: Self-pay | Admitting: Plastic Surgery

## 2014-11-07 ENCOUNTER — Ambulatory Visit (HOSPITAL_COMMUNITY)
Admission: RE | Admit: 2014-11-07 | Discharge: 2014-11-07 | Disposition: A | Discharge status: Home / Self Care | Payer: Medicare HMO | Source: Ambulatory Visit | Attending: Cardiology | Admitting: Cardiology

## 2014-11-07 ENCOUNTER — Encounter (HOSPITAL_COMMUNITY): Payer: Self-pay

## 2014-11-07 ENCOUNTER — Ambulatory Visit (HOSPITAL_BASED_OUTPATIENT_CLINIC_OR_DEPARTMENT_OTHER)
Admission: RE | Admit: 2014-11-07 | Discharge: 2014-11-07 | Disposition: A | Discharge status: Home / Self Care | Payer: Medicare HMO | Source: Ambulatory Visit | Attending: Cardiology | Admitting: Cardiology

## 2014-11-07 DIAGNOSIS — Z9011 Acquired absence of right breast and nipple: Secondary | ICD-10-CM | POA: Insufficient documentation

## 2014-11-07 DIAGNOSIS — I10 Essential (primary) hypertension: Secondary | ICD-10-CM

## 2014-11-07 DIAGNOSIS — C50919 Malignant neoplasm of unspecified site of unspecified female breast: Secondary | ICD-10-CM

## 2014-11-07 DIAGNOSIS — M069 Rheumatoid arthritis, unspecified: Secondary | ICD-10-CM | POA: Insufficient documentation

## 2014-11-07 DIAGNOSIS — Z9221 Personal history of antineoplastic chemotherapy: Secondary | ICD-10-CM | POA: Diagnosis not present

## 2014-11-07 DIAGNOSIS — Z79811 Long term (current) use of aromatase inhibitors: Secondary | ICD-10-CM | POA: Insufficient documentation

## 2014-11-07 DIAGNOSIS — M5030 Other cervical disc degeneration, unspecified cervical region: Secondary | ICD-10-CM | POA: Insufficient documentation

## 2014-11-07 DIAGNOSIS — Z79899 Other long term (current) drug therapy: Secondary | ICD-10-CM | POA: Diagnosis not present

## 2014-11-07 DIAGNOSIS — T451X5A Adverse effect of antineoplastic and immunosuppressive drugs, initial encounter: Secondary | ICD-10-CM

## 2014-11-07 DIAGNOSIS — C50911 Malignant neoplasm of unspecified site of right female breast: Secondary | ICD-10-CM | POA: Insufficient documentation

## 2014-11-07 DIAGNOSIS — T451X5S Adverse effect of antineoplastic and immunosuppressive drugs, sequela: Secondary | ICD-10-CM | POA: Insufficient documentation

## 2014-11-07 DIAGNOSIS — I7300 Raynaud's syndrome without gangrene: Secondary | ICD-10-CM | POA: Insufficient documentation

## 2014-11-07 DIAGNOSIS — Z170 Estrogen receptor positive status [ER+]: Secondary | ICD-10-CM | POA: Insufficient documentation

## 2014-11-07 DIAGNOSIS — M1990 Unspecified osteoarthritis, unspecified site: Secondary | ICD-10-CM | POA: Insufficient documentation

## 2014-11-07 DIAGNOSIS — I427 Cardiomyopathy due to drug and external agent: Secondary | ICD-10-CM | POA: Diagnosis not present

## 2014-11-07 DIAGNOSIS — C50311 Malignant neoplasm of lower-inner quadrant of right female breast: Secondary | ICD-10-CM

## 2014-11-07 NOTE — Patient Instructions (Signed)
Your physician has requested that you have an echocardiogram. Echocardiography is a painless test that uses sound waves to create images of your heart. It provides your doctor with information about the size and shape of your heart and how well your heart's chambers and valves are working. This procedure takes approximately one hour. There are no restrictions for this procedure.  Your physician recommends that you schedule a follow-up appointment in: 2 months with echocardiogram

## 2014-11-07 NOTE — Progress Notes (Signed)
Patient ID: Angela Gross, female   DOB: [birth year], 64 y.o.   MRN: [id number] Primary oncologist: Dr. Jana Hakim  64 yo with history of breast cancer diagnosed in 12/14 s/p mastectomy presents to cardio-oncology clinic.  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  She has no significant cardiac history.  Her mother had CHF but we do not know the cause.    She was started on carboplatin, docetaxel, and Herceptin in 4/15 but only tolerated 2 cycles.  Then switched to Herceptin/Perjeta and Anastazole. Had 4 cycles and then Herceptin/Perjeta stopped due to cardiotoxicity.  Herceptin was later restarted.   No complaints today, no dyspnea.  She will be getting Herceptin until 5/16.     Labs (9/15): K 3.5, creatinine 1.1  PMH: 1. Breast cancer: Diagnosed in 12/14.  ER+/PR-/HER2neu+.  She is now s/p right mastectomy.  - Echo (3/15) with EF 55-60%, lateral s' 11 cm/sec (no strain) - ECHO 05/08/14 EF ~ 50% Lateral S' 9.2 GLS -15.9  - ECHO 06/08/14 EF 55% Lateral s' 7.2 cm/sec GLS - 17.3 - ECHO 9/15 EF 50-55%, Lateral s' 10, GLS -15 - ECHO 11/15 EF 34-74%, normal diastolic function, lateral s' 11.3, GLS -16.5%, normla RV size and systolic function.  - ECHO 1/16 EF 50-55%, lateral s' 11.1 (difficult), GLS -15.9% 2. Rheumatoid arthritis 3. Raynauds syndrome 4. OA 5. Degenerative disc disease: C-spine  SH: Lives with husband in [place], nonsmoker.   FH: Mother with CHF.   ROS: All systems reviewed and negative except as per HPI.   Current Outpatient Prescriptions  Medication Sig Dispense Refill  . anastrozole (ARIMIDEX) 1 MG tablet Take 1 tablet (1 mg total) by mouth daily. 30 tablet 12  . carvedilol (COREG) 12.5 MG tablet Take 1 tablet (12.5 mg total) by mouth 2 (two) times daily with a meal. 60 tablet 3  . cyclobenzaprine (FLEXERIL) 10 MG tablet Take 1 tablet (10 mg total) by mouth 3 (three) times daily as needed for muscle spasms. 30 tablet 1  . gabapentin (NEURONTIN) 300 MG capsule Take 1 capsule  (300 mg total) by mouth 2 (two) times daily. For neuropathy and hot flashes (Patient taking differently: Take 300 mg by mouth 3 (three) times daily. For neuropathy and hot flashes) 60 capsule 5  . lidocaine-prilocaine (EMLA) cream Apply 1 application topically as needed. 1-2 hrs before port access 30 g 4  . lisinopril (PRINIVIL,ZESTRIL) 10 MG tablet Take 1 tablet (10 mg total) by mouth daily. 90 tablet 3  . oxyCODONE (OXY IR/ROXICODONE) 5 MG immediate release tablet Take 5 mg by mouth.    . traMADol (ULTRAM) 50 MG tablet Take 1 tablet (50 mg total) by mouth 3 (three) times daily as needed (pain). 90 tablet 1   No current facility-administered medications for this encounter.   BP 107/72 mmHg  Pulse 75  Resp 18  Wt 158 lb (71.668 kg)  SpO2 100% General: NAD.  Neck: No JVD, no thyromegaly or thyroid nodule.  Lungs: Clear to auscultation bilaterally with normal respiratory effort. CV: Nondisplaced PMI.  Heart regular S1/S2, no S3/S4, no murmur.  No peripheral edema.  No carotid bruit.  Normal pedal pulses.  Abdomen: Soft, nontender, no hepatosplenomegaly, no distention.  Skin: Intact without lesions or rashes.  Neurologic: Alert and oriented x 3.  Psych: Normal affect. Extremities: No clubbing or cyanosis.  HEENT: Normal.   Assessment/Plan: 1. R Breast Cancer- mT1 N0, stage IA invasive ductal carcinoma. Herceptin stopped 7/15 due to drop in EF and restarted in  9/15. 2. Chemotherapy-induced cardiomyopathy: Herceptin-related.  I reviewed today's echo.  EF, lateral s', and GLS appear essentially stable though images today were not as good as on the last echo. She is not volume overloaded on exam.  - I think that she can continue therapy with Herceptin.  Given borderline LV systolic function, will repeat echo in 2 months with office visit.   - Continue current Coreg and lisinopril.    Loralie Champagne 11/07/2014

## 2014-11-07 NOTE — Progress Notes (Signed)
  Echocardiogram 2D Echocardiogram has been performed.  Angela Gross FRANCES 11/07/2014, 8:59 AM

## 2014-11-14 ENCOUNTER — Other Ambulatory Visit: Payer: Self-pay | Admitting: *Deleted

## 2014-11-14 ENCOUNTER — Ambulatory Visit: Payer: Medicare HMO

## 2014-11-14 ENCOUNTER — Other Ambulatory Visit: Payer: Medicare Other

## 2014-11-14 ENCOUNTER — Telehealth: Payer: Self-pay | Admitting: *Deleted

## 2014-11-14 ENCOUNTER — Ambulatory Visit (HOSPITAL_BASED_OUTPATIENT_CLINIC_OR_DEPARTMENT_OTHER): Payer: Medicare HMO

## 2014-11-14 ENCOUNTER — Other Ambulatory Visit (HOSPITAL_BASED_OUTPATIENT_CLINIC_OR_DEPARTMENT_OTHER): Payer: Medicare HMO

## 2014-11-14 ENCOUNTER — Telehealth: Payer: Self-pay | Admitting: Nurse Practitioner

## 2014-11-14 ENCOUNTER — Encounter: Payer: Self-pay | Admitting: Nurse Practitioner

## 2014-11-14 ENCOUNTER — Ambulatory Visit (HOSPITAL_BASED_OUTPATIENT_CLINIC_OR_DEPARTMENT_OTHER): Payer: Medicare HMO | Admitting: Nurse Practitioner

## 2014-11-14 VITALS — BP 125/68 | HR 79 | Temp 97.6°F | Resp 18 | Ht 60.0 in | Wt 159.4 lb

## 2014-11-14 DIAGNOSIS — C50311 Malignant neoplasm of lower-inner quadrant of right female breast: Secondary | ICD-10-CM

## 2014-11-14 DIAGNOSIS — Z5112 Encounter for antineoplastic immunotherapy: Secondary | ICD-10-CM

## 2014-11-14 DIAGNOSIS — G620 Drug-induced polyneuropathy: Secondary | ICD-10-CM

## 2014-11-14 DIAGNOSIS — C50811 Malignant neoplasm of overlapping sites of right female breast: Secondary | ICD-10-CM

## 2014-11-14 DIAGNOSIS — M069 Rheumatoid arthritis, unspecified: Secondary | ICD-10-CM

## 2014-11-14 DIAGNOSIS — T451X5A Adverse effect of antineoplastic and immunosuppressive drugs, initial encounter: Secondary | ICD-10-CM

## 2014-11-14 DIAGNOSIS — M2550 Pain in unspecified joint: Secondary | ICD-10-CM

## 2014-11-14 LAB — CBC WITH DIFFERENTIAL/PLATELET
BASO%: 0.3 % (ref 0.0–2.0)
Basophils Absolute: 0 10*3/uL (ref 0.0–0.1)
EOS%: 1.8 % (ref 0.0–7.0)
Eosinophils Absolute: 0.1 10*3/uL (ref 0.0–0.5)
HCT: 36.4 % (ref 34.8–46.6)
HGB: 11.6 g/dL (ref 11.6–15.9)
LYMPH%: 32.2 % (ref 14.0–49.7)
MCH: 26.7 pg (ref 25.1–34.0)
MCHC: 31.9 g/dL (ref 31.5–36.0)
MCV: 83.7 fL (ref 79.5–101.0)
MONO#: 0.8 10*3/uL (ref 0.1–0.9)
MONO%: 9.7 % (ref 0.0–14.0)
NEUT#: 4.5 10*3/uL (ref 1.5–6.5)
NEUT%: 56 % (ref 38.4–76.8)
PLATELETS: 189 10*3/uL (ref 145–400)
RBC: 4.35 10*6/uL (ref 3.70–5.45)
RDW: 14.7 % — ABNORMAL HIGH (ref 11.2–14.5)
WBC: 7.9 10*3/uL (ref 3.9–10.3)
lymph#: 2.6 10*3/uL (ref 0.9–3.3)

## 2014-11-14 MED ORDER — ACETAMINOPHEN 325 MG PO TABS
650.0000 mg | ORAL_TABLET | Freq: Once | ORAL | Status: AC
  Administered 2014-11-14: 650 mg via ORAL

## 2014-11-14 MED ORDER — TRASTUZUMAB CHEMO INJECTION 440 MG
6.0000 mg/kg | Freq: Once | INTRAVENOUS | Status: AC
  Administered 2014-11-14: 420 mg via INTRAVENOUS
  Filled 2014-11-14: qty 20

## 2014-11-14 MED ORDER — DIPHENHYDRAMINE HCL 25 MG PO CAPS
25.0000 mg | ORAL_CAPSULE | Freq: Once | ORAL | Status: AC
  Administered 2014-11-14: 25 mg via ORAL

## 2014-11-14 MED ORDER — DIPHENHYDRAMINE HCL 25 MG PO CAPS
ORAL_CAPSULE | ORAL | Status: AC
Start: 2014-11-14 — End: 2014-11-14
  Filled 2014-11-14: qty 1

## 2014-11-14 MED ORDER — ACETAMINOPHEN 325 MG PO TABS
ORAL_TABLET | ORAL | Status: AC
  Filled 2014-11-14: qty 2

## 2014-11-14 MED ORDER — SODIUM CHLORIDE 0.9 % IJ SOLN
10.0000 mL | INTRAMUSCULAR | Status: DC | PRN
  Administered 2014-11-14: 10 mL
  Filled 2014-11-14: qty 10

## 2014-11-14 MED ORDER — OXYCODONE HCL 5 MG PO TABS
5.0000 mg | ORAL_TABLET | ORAL | Status: DC | PRN
Start: 2014-11-14 — End: 2014-12-05

## 2014-11-14 MED ORDER — SODIUM CHLORIDE 0.9 % IV SOLN
Freq: Once | INTRAVENOUS | Status: AC
  Administered 2014-11-14: 11:00:00 via INTRAVENOUS

## 2014-11-14 MED ORDER — HEPARIN SOD (PORK) LOCK FLUSH 100 UNIT/ML IV SOLN
500.0000 [IU] | Freq: Once | INTRAVENOUS | Status: AC | PRN
  Administered 2014-11-14: 500 [IU]
  Filled 2014-11-14: qty 5

## 2014-11-14 NOTE — Telephone Encounter (Signed)
per pof to sch pt appt-semt MW email to sch pt appt-pt to get updated sch b4 leaving

## 2014-11-14 NOTE — Progress Notes (Signed)
[facility]  [phone] [phone]) [phone]     ID: [name] OB: 1951/01/08  MR#: [id number]  CWC#:[id number]  PCP: No primary care provider on file. GYN:   SU: Fanny Skates OTHER MD: [name], Claire Sanger, Arnoldo Hooker Thimmappa  CHIEF COMPLAINT:  Right Breast Cancer, triple positive CURRENT TREATMENT:  Trastuzumab/, anastrozole  HISTORY OF PRESENT ILLNESS: From the original intake note:  Candie had routine screening mammography at the breast center 09/21/2013 showing a possible mass in calcifications in the right breast. On 10/11/2013 she underwent right diagnostic mammography and ultrasonography. This confirmed a spiculated 1.2 cm mass in the central right breast, with a group of pleomorphic calcifications slightly laterally. The calcifications spanning 1.6 cm. A second group of calcifications extended inferiorly and spanned 8 mm. The entire area in aggregate measured 6.5 cm. By physical exam there was no palpable abnormality. Ultrasonography showed an irregular hypoechoic mass at the 6:00 position of the right breast measuring 1 cm maximally. The right axilla was normal.  Biopsy of the right breast mass in question as well as the more lateral area of calcifications on 10/11/2013 showed ([id number]) most to be positive for an invasive ductal carcinoma, both estrogen receptor 85-90% positive with strong staining intensity, both progesterone receptor negative, with an MIB-1 of 17%. HER-2 was amplified, with a signals ratio of 3.44 and a copy number per cell 04 0.30.  Bilateral breast MRI 10/19/2013 showed again a spiculated mass in the 6:00 region of the right breast measuring 1.2 cm, a slightly more lateral hematoma associated with a second biopsy and also with clumped enhancement, and asymmetric none masslike enhancement in most of the right breast, the entire area of abnormality measuring 9.0 cm. The left breast was unremarkable and there were no  abnormal appearing lymph nodes.  Her subsequent history is as detailed below   INTERVAL HISTORY: [name] returns for follow up of her breast cancer. She is due for trastuzumab today. She also continues on anastrozole daily. She is tolerating both drugs well with few complaints. She has some vaginal dryness. Her hot flashes are better managed with 300 mg gabapentin TID, which she increased to TID because of worsening foot/neuropathy as discussed at the last visit. She believes now that her raynaud's and history of rheumatoid arthritis is contributing to this pain. The soles of her feet can turn a faint reddish purple color. She takes oxydone and tramadol PRN for this pain as well.   REVIEW OF SYSTEMS: Makynzi denies fevers, chills, nausea, or vomiting. She is constipated again, but has not increased her water intake or using stool softeners as I have advised. Her appetite and energy level are fair. Sometimes she does not sleep well at night. Joana is occasionally short of breath with exertion, but denies chest pain, palpitations, or cough. A detailed review of systems is otherwise negative.   PAST MEDICAL HISTORY: Past Medical History  Diagnosis Date  . Raynaud disease   . Osteoporosis   . Hypertension   . PONV (postoperative nausea and vomiting)   . Wears glasses   . Full dentures   . Fibromyalgia   . Rheumatoid arthritis     "elbows; fingers; toes"  . Osteoarthritis of both knees   . Degenerative disc disease, cervical   . Chronic left shoulder pain     "work related injury" (01/10/2014)  . Breast cancer     "right" (01/10/2014)  . Type II diabetes mellitus     has not taken  meds in over a month (01/10/2014)  . Syncope and collapse     "4 times in the last year" (01/10/2014)    PAST SURGICAL HISTORY: Past Surgical History  Procedure Laterality Date  . Colonoscopy    . Mastectomy w/ sentinel node biopsy Right 12/13/2013    Procedure: RIGHT TOTAL MASTECTOMY WITH SENTINEL LYMPH NODE  BIOPSY;  Surgeon: Adin Hector, MD;  Location: [facility];  Service: General;  Laterality: Right;  . Portacath placement Right 12/13/2013    Procedure: INSERTION PORT-A-CATH;  Surgeon: Adin Hector, MD;  Location: [facility];  Service: General;  Laterality: Right;  . Breast reconstruction with placement of tissue expander and flex hd (acellular hydrated dermis) Right 12/13/2013    Procedure: RIGHT BREAST RECONSTRUCTION WITH PLACEMENT OF TISSUE EXPANDER AND FLEX HD (ACELLULAR HYDRATED DERMIS);  Surgeon: Irene Limbo, MD;  Location: [facility];  Service: Plastics;  Laterality: Right;  . Lipoma excision Bilateral 12/13/2013    Procedure: EXCISION OF LEFT BREAST KELOID AND RIGHT CHEST KELOID;  Surgeon: Irene Limbo, MD;  Location: [facility];  Service: Plastics;  Laterality: Bilateral;  . Mastectomy    . Cholecystectomy  1970's  . Vaginal hysterectomy  1980    no salpingo-oophoretomu  . Breast biopsy Right 10/2013  . Tissue expander placement Right 01/10/2014    Procedure: Incision and Drainage Right Breast Seroma with TISSUE EXPANDER exchange;  Surgeon: Irene Limbo, MD;  Location: [facility];  Service: Plastics;  Laterality: Right;  . Removal of bilateral tissue expanders with placement of bilateral breast implants Bilateral 09/26/2014    Procedure: REMOVAL OF RIGHT TISSUE EXPANDER WITH PLACEMENT OF RIGHT BREAST IMPLANT LIPOFILLING TO RIGHT BREAST/LEFT BREAST MASTOPEXY FOR SYMMETRY;  Surgeon: Irene Limbo, MD;  Location: [facility];  Service: Plastics;  Laterality: Bilateral;  . Liposuction with lipofilling Right 09/26/2014    Procedure: LIPOSUCTION WITH LIPOFILLING;  Surgeon: Irene Limbo, MD;  Location: [facility];  Service: Plastics;  Laterality: Right;  . Mastopexy Left 09/26/2014    Procedure: MASTOPEXY;  Surgeon: Irene Limbo, MD;  Location: [facility];  Service:  Plastics;  Laterality: Left;    FAMILY HISTORY No family history on file. The patient is little information about her father. Her mother died from a heart attack at the age of 42. She had been diagnosed with breast cancer in her late 28s. The patient had 4 brothers and 4 sisters. There is no other history of breast or ovarian cancer in the family to her knowledge.  GYNECOLOGIC HISTORY:    (Reviewed 04/04/2014) She does not recall her age of menarche. She underwent hysterectomy in her 48s. She did not receive hormone replacement. First live birth at 16. She was GX P2.  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 She is currently disabled secondary to her rheumatoid arthritis. Her husband Oleta Gunnoe is a retired Administrator. He now works part time at [employer]. Son [name] works in [place] as a Patent attorney. Daughter [name] is a Social worker.    ADVANCED DIRECTIVES: Not in place   HEALTH MAINTENANCE: (Updated 04/04/2014) History  Substance Use Topics  . Smoking status: Never Smoker   . Smokeless tobacco: Never Used  . Alcohol Use: No     Colonoscopy: Not on file  PAP: Status post remote hysterectomy  [facility] showed osteopenia with a T score of -1.7  Lipid panel: Not on file   Allergies  Allergen Reactions  . Codeine Other (See Comments) and Hives    Altered mental status, numbness  Altered mental status, Numbess  . Diazepam     Other reaction(s): Delusions (intolerance) hallucinations     Current Outpatient Prescriptions  Medication Sig Dispense Refill  . anastrozole (ARIMIDEX) 1 MG tablet Take 1 tablet (1 mg total) by mouth daily. 30 tablet 12  . carvedilol (COREG) 12.5 MG tablet Take 1 tablet (12.5 mg total) by mouth 2 (two) times daily with a meal. 60 tablet 3  . cyclobenzaprine (FLEXERIL) 10 MG tablet Take 1  tablet (10 mg total) by mouth 3 (three) times daily as needed for muscle spasms. 30 tablet 1  . gabapentin (NEURONTIN) 300 MG capsule Take 1 capsule (300 mg total) by mouth 2 (two) times daily. For neuropathy and hot flashes (Patient taking differently: Take 300 mg by mouth 3 (three) times daily. For neuropathy and hot flashes) 60 capsule 5  . lidocaine-prilocaine (EMLA) cream Apply 1 application topically as needed. 1-2 hrs before port access 30 g 4  . lisinopril (PRINIVIL,ZESTRIL) 10 MG tablet Take 1 tablet (10 mg total) by mouth daily. 90 tablet 3  . traMADol (ULTRAM) 50 MG tablet Take 1 tablet (50 mg total) by mouth 3 (three) times daily as needed (pain). 90 tablet 1  . oxyCODONE (OXY IR/ROXICODONE) 5 MG immediate release tablet Take 1 tablet (5 mg total) by mouth every 4 (four) hours as needed for severe pain. 30 tablet 0   No current facility-administered medications for this visit.   Facility-Administered Medications Ordered in Other Visits  Medication Dose Route Frequency Provider Last Rate Last Dose  . 0.9 %  sodium chloride infusion   Intravenous Once Chauncey Cruel, MD      . acetaminophen (TYLENOL) tablet 650 mg  650 mg Oral Once Chauncey Cruel, MD      . diphenhydrAMINE (BENADRYL) capsule 25 mg  25 mg Oral Once Chauncey Cruel, MD      . heparin lock flush 100 unit/mL  500 Units Intracatheter Once PRN Chauncey Cruel, MD      . sodium chloride 0.9 % injection 10 mL  10 mL Intracatheter PRN Chauncey Cruel, MD      . trastuzumab (HERCEPTIN) 420 mg in sodium chloride 0.9 % 250 mL chemo infusion  6 mg/kg (Treatment Plan Actual) Intravenous Once Chauncey Cruel, MD        OBJECTIVE:  Middle-aged African-American woman who appears stated age 64 Vitals:   11/14/14 1026  BP: 125/68  Pulse: 79  Temp: 97.6 F (36.4 C)  Resp: 18     Body mass index is 31.13 kg/(m^2).    ECOG FS: 1  Filed Weights   11/14/14 1026  Weight: 159 lb 6.4 oz (72.303 kg)   Skin: warm, dry   HEENT: sclerae anicteric, conjunctivae pink, oropharynx clear. No thrush or mucositis.  Lymph Nodes: No cervical or supraclavicular lymphadenopathy  Lungs: clear to auscultation bilaterally, no rales, wheezes, or rhonci  Heart: regular rate and rhythm  Abdomen: round, soft, non tender, positive bowel sounds  Musculoskeletal: No focal spinal tenderness, no peripheral edema  Neuro: non focal, well oriented, positive affect  Breasts: deferred  LAB RESULTS:   Lab Results  Component Value Date   WBC 7.9 11/14/2014   NEUTROABS 4.5 11/14/2014   HGB 11.6 11/14/2014  HCT 36.4 11/14/2014   MCV 83.7 11/14/2014   PLT 189 11/14/2014      Chemistry      Component Value Date/Time   NA 142 10/25/2014 0949   NA 138 01/09/2014 1522   K 3.5 10/25/2014 0949   K 4.2 01/09/2014 1522   CL 100 01/09/2014 1522   CO2 24 10/25/2014 0949   CO2 25 01/09/2014 1522   BUN 11.4 10/25/2014 0949   BUN 11 01/09/2014 1522   CREATININE 0.9 10/25/2014 0949   CREATININE 0.99 01/09/2014 1522      Component Value Date/Time   CALCIUM 8.4 10/25/2014 0949   CALCIUM 8.5 01/09/2014 1522   ALKPHOS 98 10/25/2014 0949   ALKPHOS 102 12/09/2013 1400   AST 15 10/25/2014 0949   AST 36 12/09/2013 1400   ALT 8 10/25/2014 0949   ALT 30 12/09/2013 1400   BILITOT 0.28 10/25/2014 0949   BILITOT 0.2* 12/09/2013 1400     STUDIES: Most recent echocardiogram on 11/06/14 showed an improvement in ejection fraction to 50-55%  ASSESSMENT: 64 y.o.  woman   (1)  s/p biopsy of separate Right breast masses 10/11/2013 for a clinical mT1 N0, stage IA invasive ductal carcinoma, grade II-III, one of the mass is being 85% estrogen receptor positive, progesterone receptor negative, with an MIB-1 of 17% and HER-2 amplified.  (2) status post right mastectomy and sentinel lymph node sampling with immediate expander placement 12/13/2013 for a pT1c pN0, stage IA invasive ductal carcinoma, grade 3, again HER-2 positive.  (3)  treated in the adjuvant setting with carboplatin, docetaxel, trastuzumab and pertuzumab, first dose on 02/21/2014   (a). Patient developed significant peripheral neuropathy after one cycle of chemotherapy, and both carboplatin and docetaxel were held beginning with cycle 2. Continued pertuzumab/ trastuzumab x 5 more cycles, completed 08/01/2014  (b)  single agent trastuzumab to be started 08/29/2014, to be continued every 3 weeks until May 2016  (d) most recent echocardiogram 07/10/2014 showed an ejection fraction in the 50-55% range.  (4) started on anastrozole on 04/04/2014 .   (5) Rheumatoid arthritis,  on Remicade, prednisone and methotrexate  (6) chemotherapy-induced peripheral neuropathy  - being treated with gabapentin, 300 BID, and tramadol, 50 mg every 6 hours as needed.     PLAN: Crista is doing moderately well today. The labs were reviewed in detail and were stable. She had an echocardiogram on 1/25 that showed a well preserved ejection fraction. This will be repeated in late March. She proceed with trastuzumab today as scheduled. She will continue anastrozole daily as well.   I have refilled her oxycodone today, but it may be best that she return to her rheumatologist who previously had her on lyrica and remicade, which worked.   Clarabelle is having reconstructive surgery on 3/1. She understands and agrees with this plan. She knows the goal of treatment in her case is cure. She has been encouraged to call with any issues that might arise before her next visit here.   Marcelino Duster, NP  11/14/2014 11:00 AM

## 2014-11-14 NOTE — Telephone Encounter (Signed)
Per staff message and POF I have scheduled appts. Advised scheduler of appts. JMW  

## 2014-11-14 NOTE — Patient Instructions (Signed)
[facility] Discharge Instructions for Patients  Today you received the following: Herceptin.  To help prevent nausea and vomiting after your treatment, we encourage you to take your nausea medication as prescribed.   If you develop nausea and vomiting that is not controlled by your nausea medication, call the clinic.   BELOW ARE SYMPTOMS THAT SHOULD BE REPORTED IMMEDIATELY:  *FEVER GREATER THAN 100.5 F  *CHILLS WITH OR WITHOUT FEVER  NAUSEA AND VOMITING THAT IS NOT CONTROLLED WITH YOUR NAUSEA MEDICATION  *UNUSUAL SHORTNESS OF BREATH  *UNUSUAL BRUISING OR BLEEDING  TENDERNESS IN MOUTH AND THROAT WITH OR WITHOUT PRESENCE OF ULCERS  *URINARY PROBLEMS  *BOWEL PROBLEMS  UNUSUAL RASH Items with * indicate a potential emergency and should be followed up as soon as possible.  Feel free to call the clinic you have any questions or concerns. The clinic phone number is [phone].

## 2014-11-19 ENCOUNTER — Other Ambulatory Visit: Payer: Self-pay | Admitting: Oncology

## 2014-11-19 NOTE — Progress Notes (Unsigned)
Assessment/Plan: 1. R Breast Cancer- mT1 N0, stage IA invasive ductal carcinoma. Herceptin stopped 7/15 due to drop in EF and restarted in 9/15. 2. Chemotherapy-induced cardiomyopathy: Herceptin-related. I reviewed today's echo. EF, lateral s', and GLS appear essentially stable though images today were not as good as on the last echo. She is not volume overloaded on exam.  - I think that she can continue therapy with Herceptin. Given borderline LV systolic function, will repeat echo in 2 months with office visit.  - Continue current Coreg and lisinopril.   Loralie Champagne 11/07/2014

## 2014-12-05 ENCOUNTER — Other Ambulatory Visit (HOSPITAL_BASED_OUTPATIENT_CLINIC_OR_DEPARTMENT_OTHER): Payer: Medicare HMO

## 2014-12-05 ENCOUNTER — Other Ambulatory Visit: Payer: Self-pay | Admitting: Oncology

## 2014-12-05 ENCOUNTER — Ambulatory Visit (HOSPITAL_BASED_OUTPATIENT_CLINIC_OR_DEPARTMENT_OTHER): Payer: Medicare HMO | Admitting: Nurse Practitioner

## 2014-12-05 ENCOUNTER — Other Ambulatory Visit: Payer: Medicare Other

## 2014-12-05 ENCOUNTER — Ambulatory Visit: Payer: Commercial Managed Care - HMO

## 2014-12-05 ENCOUNTER — Ambulatory Visit (HOSPITAL_BASED_OUTPATIENT_CLINIC_OR_DEPARTMENT_OTHER): Payer: Medicare HMO

## 2014-12-05 ENCOUNTER — Encounter: Payer: Self-pay | Admitting: Nurse Practitioner

## 2014-12-05 ENCOUNTER — Telehealth: Payer: Self-pay | Admitting: Nurse Practitioner

## 2014-12-05 ENCOUNTER — Other Ambulatory Visit: Payer: Self-pay | Admitting: *Deleted

## 2014-12-05 VITALS — BP 138/82 | HR 85 | Temp 97.8°F | Resp 18 | Ht 60.0 in | Wt 159.9 lb

## 2014-12-05 DIAGNOSIS — C50811 Malignant neoplasm of overlapping sites of right female breast: Secondary | ICD-10-CM

## 2014-12-05 DIAGNOSIS — M2550 Pain in unspecified joint: Secondary | ICD-10-CM

## 2014-12-05 DIAGNOSIS — C50311 Malignant neoplasm of lower-inner quadrant of right female breast: Secondary | ICD-10-CM

## 2014-12-05 DIAGNOSIS — Z5112 Encounter for antineoplastic immunotherapy: Secondary | ICD-10-CM | POA: Diagnosis not present

## 2014-12-05 DIAGNOSIS — G620 Drug-induced polyneuropathy: Secondary | ICD-10-CM

## 2014-12-05 DIAGNOSIS — Z170 Estrogen receptor positive status [ER+]: Secondary | ICD-10-CM

## 2014-12-05 DIAGNOSIS — Z95828 Presence of other vascular implants and grafts: Secondary | ICD-10-CM

## 2014-12-05 DIAGNOSIS — T451X5A Adverse effect of antineoplastic and immunosuppressive drugs, initial encounter: Secondary | ICD-10-CM

## 2014-12-05 LAB — CBC WITH DIFFERENTIAL/PLATELET
BASO%: 1 % (ref 0.0–2.0)
Basophils Absolute: 0.1 10*3/uL (ref 0.0–0.1)
EOS%: 2.3 % (ref 0.0–7.0)
Eosinophils Absolute: 0.2 10*3/uL (ref 0.0–0.5)
HCT: 38.8 % (ref 34.8–46.6)
HGB: 12.3 g/dL (ref 11.6–15.9)
LYMPH%: 31.4 % (ref 14.0–49.7)
MCH: 25.9 pg (ref 25.1–34.0)
MCHC: 31.7 g/dL (ref 31.5–36.0)
MCV: 81.8 fL (ref 79.5–101.0)
MONO#: 0.7 10*3/uL (ref 0.1–0.9)
MONO%: 9 % (ref 0.0–14.0)
NEUT%: 56.3 % (ref 38.4–76.8)
NEUTROS ABS: 4.7 10*3/uL (ref 1.5–6.5)
Platelets: 183 10*3/uL (ref 145–400)
RBC: 4.74 10*6/uL (ref 3.70–5.45)
RDW: 15.4 % — AB (ref 11.2–14.5)
WBC: 8.3 10*3/uL (ref 3.9–10.3)
lymph#: 2.6 10*3/uL (ref 0.9–3.3)

## 2014-12-05 LAB — COMPREHENSIVE METABOLIC PANEL (CC13)
ALK PHOS: 107 U/L (ref 40–150)
ALT: 7 U/L (ref 0–55)
AST: 15 U/L (ref 5–34)
Albumin: 3.4 g/dL — ABNORMAL LOW (ref 3.5–5.0)
Anion Gap: 11 mEq/L (ref 3–11)
BUN: 10.8 mg/dL (ref 7.0–26.0)
CHLORIDE: 106 meq/L (ref 98–109)
CO2: 25 mEq/L (ref 22–29)
Calcium: 9.1 mg/dL (ref 8.4–10.4)
Creatinine: 0.9 mg/dL (ref 0.6–1.1)
EGFR: 79 mL/min/{1.73_m2} — ABNORMAL LOW (ref >90)
Glucose: 107 mg/dl (ref 70–140)
POTASSIUM: 4 meq/L (ref 3.5–5.1)
SODIUM: 142 meq/L (ref 136–145)
Total Bilirubin: 0.28 mg/dL (ref 0.20–1.20)
Total Protein: 7.2 g/dL (ref 6.4–8.3)

## 2014-12-05 MED ORDER — SODIUM CHLORIDE 0.9 % IV SOLN
Freq: Once | INTRAVENOUS | Status: AC
  Administered 2014-12-05: 13:00:00 via INTRAVENOUS

## 2014-12-05 MED ORDER — ACETAMINOPHEN 325 MG PO TABS
ORAL_TABLET | ORAL | Status: AC
  Filled 2014-12-05: qty 2

## 2014-12-05 MED ORDER — SODIUM CHLORIDE 0.9 % IJ SOLN
10.0000 mL | INTRAMUSCULAR | Status: DC | PRN
  Administered 2014-12-05: 10 mL via INTRAVENOUS
  Filled 2014-12-05: qty 10

## 2014-12-05 MED ORDER — OXYCODONE HCL 5 MG PO TABS: 5.0000 mg | ORAL_TABLET | ORAL | Status: DC | PRN

## 2014-12-05 MED ORDER — HEPARIN SOD (PORK) LOCK FLUSH 100 UNIT/ML IV SOLN
500.0000 [IU] | Freq: Once | INTRAVENOUS | Status: AC | PRN
  Administered 2014-12-05: 500 [IU]
  Filled 2014-12-05: qty 5

## 2014-12-05 MED ORDER — TRASTUZUMAB CHEMO INJECTION 440 MG
6.0000 mg/kg | Freq: Once | INTRAVENOUS | Status: AC
  Administered 2014-12-05: 420 mg via INTRAVENOUS
  Filled 2014-12-05: qty 20

## 2014-12-05 MED ORDER — DIPHENHYDRAMINE HCL 25 MG PO CAPS
ORAL_CAPSULE | ORAL | Status: AC
  Filled 2014-12-05: qty 2

## 2014-12-05 MED ORDER — SODIUM CHLORIDE 0.9 % IJ SOLN
10.0000 mL | INTRAMUSCULAR | Status: DC | PRN
  Administered 2014-12-05: 10 mL
  Filled 2014-12-05: qty 10

## 2014-12-05 MED ORDER — CYCLOBENZAPRINE HCL 10 MG PO TABS: 10.0000 mg | ORAL_TABLET | Freq: Three times a day (TID) | ORAL | Status: DC | PRN

## 2014-12-05 MED ORDER — ACETAMINOPHEN 325 MG PO TABS
650.0000 mg | ORAL_TABLET | Freq: Once | ORAL | Status: AC
  Administered 2014-12-05: 650 mg via ORAL

## 2014-12-05 MED ORDER — ANASTROZOLE 1 MG PO TABS: 1.0000 mg | ORAL_TABLET | Freq: Every day | ORAL | Status: DC

## 2014-12-05 MED ORDER — DIPHENHYDRAMINE HCL 25 MG PO CAPS
25.0000 mg | ORAL_CAPSULE | Freq: Once | ORAL | Status: AC
  Administered 2014-12-05: 25 mg via ORAL

## 2014-12-05 NOTE — Progress Notes (Signed)
[place]  Telephone:(336) [phone] Fax:(336) [phone]     ID: Angela Gross OB: [birth year]  MR#: [id number]  KPQ#:[id number]  PCP: No primary care provider on file. GYN:   SU: Fanny Skates OTHER MD: [name], Claire Sanger, Arnoldo Hooker Thimmappa  CHIEF COMPLAINT:  Right Breast Cancer, triple positive CURRENT TREATMENT:  Trastuzumab/, anastrozole  HISTORY OF PRESENT ILLNESS: From the original intake note:  Angela Gross had routine screening mammography at the breast center 09/21/2013 showing a possible mass in calcifications in the right breast. On 10/11/2013 she underwent right diagnostic mammography and ultrasonography. This confirmed a spiculated 1.2 cm mass in the central right breast, with a group of pleomorphic calcifications slightly laterally. The calcifications spanning 1.6 cm. A second group of calcifications extended inferiorly and spanned 8 mm. The entire area in aggregate measured 6.5 cm. By physical exam there was no palpable abnormality. Ultrasonography showed an irregular hypoechoic mass at the 6:00 position of the right breast measuring 1 cm maximally. The right axilla was normal.  Biopsy of the right breast mass in question as well as the more lateral area of calcifications on 10/11/2013 showed ([id number]) most to be positive for an invasive ductal carcinoma, both estrogen receptor 85-90% positive with strong staining intensity, both progesterone receptor negative, with an MIB-1 of 17%. HER-2 was amplified, with a signals ratio of 3.44 and a copy number per cell 04 0.30.  Bilateral breast MRI 10/19/2013 showed again a spiculated mass in the 6:00 region of the right breast measuring 1.2 cm, a slightly more lateral hematoma associated with a second biopsy and also with clumped enhancement, and asymmetric none masslike enhancement in most of the right breast, the entire area of abnormality measuring 9.0 cm. The left breast was unremarkable and there were no  abnormal appearing lymph nodes.  Her subsequent history is as detailed below   INTERVAL HISTORY: Angela Gross returns for follow up of her breast cancer. She is due for trastuzumab today. She also continues on anastrozole daily. She is tolerating both drugs well with few complaints. She has some vaginal dryness. She takes gabapentin TID for her hot flashes and residual neuropathy symptoms. Due to the weather, history of raynaud's phenomenon and rheumatoid arthritis, the pain to her feet has been severe. The soles of her feet can turn a faint reddish purple color. She takes oxydone and tramadol PRN for this pain as well. She uses flexeril for muscle spasms.   REVIEW OF SYSTEMS: Angela Gross denies fevers, chills, nausea, or vomiting. She has constipation on and off. Her appetite and energy level are fair. Sometimes she does not sleep well at night. Angela Gross is occasionally short of breath with exertion, but denies chest pain, palpitations, or cough. Her mood is stable. She has some depression, but denies anxiety. A detailed review of systems is otherwise negative.   PAST MEDICAL HISTORY: Past Medical History  Diagnosis Date  . Raynaud disease   . Osteoporosis   . Hypertension   . PONV (postoperative nausea and vomiting)   . Wears glasses   . Full dentures   . Fibromyalgia   . Rheumatoid arthritis     "elbows; fingers; toes"  . Osteoarthritis of both knees   . Degenerative disc disease, cervical   . Chronic left shoulder pain     "work related injury" (01/10/2014)  . Breast cancer     "right" (01/10/2014)  . Type II diabetes mellitus     has not taken meds in over a month (01/10/2014)  .  Syncope and collapse     "4 times in the last year" (01/10/2014)    PAST SURGICAL HISTORY: Past Surgical History  Procedure Laterality Date  . Colonoscopy    . Mastectomy w/ sentinel node biopsy Right 12/13/2013    Procedure: RIGHT TOTAL MASTECTOMY WITH SENTINEL LYMPH NODE BIOPSY;  Surgeon: Adin Hector, MD;   Location: [place];  Service: General;  Laterality: Right;  . Portacath placement Right 12/13/2013    Procedure: INSERTION PORT-A-CATH;  Surgeon: Adin Hector, MD;  Location: [place];  Service: General;  Laterality: Right;  . Breast reconstruction with placement of tissue expander and flex hd (acellular hydrated dermis) Right 12/13/2013    Procedure: RIGHT BREAST RECONSTRUCTION WITH PLACEMENT OF TISSUE EXPANDER AND FLEX HD (ACELLULAR HYDRATED DERMIS);  Surgeon: Irene Limbo, MD;  Location: [place];  Service: Plastics;  Laterality: Right;  . Lipoma excision Bilateral 12/13/2013    Procedure: EXCISION OF LEFT BREAST KELOID AND RIGHT CHEST KELOID;  Surgeon: Irene Limbo, MD;  Location: [place];  Service: Plastics;  Laterality: Bilateral;  . Mastectomy    . Cholecystectomy  1970's  . Vaginal hysterectomy  1980    no salpingo-oophoretomu  . Breast biopsy Right 10/2013  . Tissue expander placement Right 01/10/2014    Procedure: Incision and Drainage Right Breast Seroma with TISSUE EXPANDER exchange;  Surgeon: Irene Limbo, MD;  Location: [place];  Service: Plastics;  Laterality: Right;  . Removal of bilateral tissue expanders with placement of bilateral breast implants Bilateral 09/26/2014    Procedure: REMOVAL OF RIGHT TISSUE EXPANDER WITH PLACEMENT OF RIGHT BREAST IMPLANT LIPOFILLING TO RIGHT BREAST/LEFT BREAST MASTOPEXY FOR SYMMETRY;  Surgeon: Irene Limbo, MD;  Location: [place];  Service: Plastics;  Laterality: Bilateral;  . Liposuction with lipofilling Right 09/26/2014    Procedure: LIPOSUCTION WITH LIPOFILLING;  Surgeon: Irene Limbo, MD;  Location: [place];  Service: Plastics;  Laterality: Right;  . Mastopexy Left 09/26/2014    Procedure: MASTOPEXY;  Surgeon: Irene Limbo, MD;  Location: [place];  Service: Plastics;  Laterality: Left;    FAMILY  HISTORY No family history on file. The patient is little information about her father. Her mother died from a heart attack at the age of 33. She had been diagnosed with breast cancer in her late 78s. The patient had 4 brothers and 4 sisters. There is no other history of breast or ovarian cancer in the family to her knowledge.  GYNECOLOGIC HISTORY:    (Reviewed 04/04/2014) She does not recall her age of menarche. She underwent hysterectomy in her 61s. She did not receive hormone replacement. First live birth at 89. She was GX P2.  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 She is currently disabled secondary to her rheumatoid arthritis. Her husband Shimeka Bacot is a retired Administrator. He now works part time at [employer]. Son [name] works in [place] as a Patent attorney. Daughter [name] is a Social worker.    ADVANCED DIRECTIVES: Not in place   HEALTH MAINTENANCE: (Updated 04/04/2014) History  Substance Use Topics  . Smoking status: Never Smoker   . Smokeless tobacco: Never Used  . Alcohol Use: No     Colonoscopy: Not on file  PAP: Status post remote hysterectomy  Bone density: At [facility] hospital 01/05/2009 showed osteopenia with a T score  of -1.7  Lipid panel: Not on file   Allergies  Allergen Reactions  . Codeine Other (See Comments) and Hives    Altered mental status, numbness  Altered mental status, Numbess  . Diazepam     Other reaction(s): Delusions (intolerance) hallucinations     Current Outpatient Prescriptions  Medication Sig Dispense Refill  . anastrozole (ARIMIDEX) 1 MG tablet Take 1 tablet (1 mg total) by mouth daily. 30 tablet 12  . carvedilol (COREG) 12.5 MG tablet Take 1 tablet (12.5 mg total) by mouth 2 (two) times daily with a meal. 60 tablet 3  . gabapentin (NEURONTIN) 300 MG capsule Take 1 capsule (300 mg total) by mouth 2 (two) times  daily. For neuropathy and hot flashes (Patient taking differently: Take 300 mg by mouth 3 (three) times daily. For neuropathy and hot flashes) 60 capsule 5  . lidocaine-prilocaine (EMLA) cream Apply 1 application topically as needed. 1-2 hrs before port access 30 g 4  . lisinopril (PRINIVIL,ZESTRIL) 10 MG tablet Take 1 tablet (10 mg total) by mouth daily. 90 tablet 3  . traMADol (ULTRAM) 50 MG tablet Take 1 tablet (50 mg total) by mouth 3 (three) times daily as needed (pain). 90 tablet 1  . cyclobenzaprine (FLEXERIL) 10 MG tablet Take 1 tablet (10 mg total) by mouth 3 (three) times daily as needed for muscle spasms. 30 tablet 1  . oxyCODONE (OXY IR/ROXICODONE) 5 MG immediate release tablet Take 1 tablet (5 mg total) by mouth every 4 (four) hours as needed for severe pain. 30 tablet 0   No current facility-administered medications for this visit.    OBJECTIVE:  Middle-aged African-American woman who appears stated age 64 Vitals:   12/05/14 1112  BP: 136/95  Pulse: 85  Temp: 97.8 F (36.6 C)  Resp: 18     Body mass index is 31.23 kg/(m^2).    ECOG FS: 1  Filed Weights   12/05/14 1112  Weight: 159 lb 14.4 oz (72.53 kg)   Sclerae unicteric, pupils equal and reactive Oropharynx clear and moist-- no thrush No cervical or supraclavicular adenopathy Lungs no rales or rhonchi Heart regular rate and rhythm Abd soft, nontender, positive bowel sounds MSK no focal spinal tenderness, no upper extremity lymphedema Neuro: nonfocal, well oriented, appropriate affect Breasts: deferred  LAB RESULTS:   Lab Results  Component Value Date   WBC 8.3 12/05/2014   NEUTROABS 4.7 12/05/2014   HGB 12.3 12/05/2014   HCT 38.8 12/05/2014   MCV 81.8 12/05/2014   PLT 183 12/05/2014      Chemistry      Component Value Date/Time   NA 142 12/05/2014 1050   NA 138 01/09/2014 1522   K 4.0 12/05/2014 1050   K 4.2 01/09/2014 1522   CL 100 01/09/2014 1522   CO2 25 12/05/2014 1050   CO2 25 01/09/2014  1522   BUN 10.8 12/05/2014 1050   BUN 11 01/09/2014 1522   CREATININE 0.9 12/05/2014 1050   CREATININE 0.99 01/09/2014 1522      Component Value Date/Time   CALCIUM 9.1 12/05/2014 1050   CALCIUM 8.5 01/09/2014 1522   ALKPHOS 107 12/05/2014 1050   ALKPHOS 102 12/09/2013 1400   AST 15 12/05/2014 1050   AST 36 12/09/2013 1400   ALT 7 12/05/2014 1050   ALT 30 12/09/2013 1400   BILITOT 0.28 12/05/2014 1050   BILITOT 0.2* 12/09/2013 1400     STUDIES: Most recent echocardiogram on 11/06/14 showed an improvement in ejection fraction to 50-55%  ASSESSMENT: 64 y.o. [place] woman   (1)  s/p biopsy of separate Right breast masses 10/11/2013 for a clinical mT1 N0, stage IA invasive ductal carcinoma, grade II-III, one of the mass is being 85% estrogen receptor positive, progesterone receptor negative, with an MIB-1 of 17% and HER-2 amplified.  (2) status post right mastectomy and sentinel lymph node sampling with immediate expander placement 12/13/2013 for a pT1c pN0, stage IA invasive ductal carcinoma, grade 3, again HER-2 positive.  (3) treated in the adjuvant setting with carboplatin, docetaxel, trastuzumab and pertuzumab, first dose on 02/21/2014   (a). Patient developed significant peripheral neuropathy after one cycle of chemotherapy, and both carboplatin and docetaxel were held beginning with cycle 2. Continued pertuzumab/ trastuzumab x 5 more cycles, completed 08/01/2014  (b)  single agent trastuzumab to be started 08/29/2014, to be continued every 3 weeks until May 2016  (d) most recent echocardiogram 07/10/2014 showed an ejection fraction in the 50-55% range.  (4) started on anastrozole on 04/04/2014 .   (5) Rheumatoid arthritis,  on Remicade, prednisone and methotrexate  (6) chemotherapy-induced peripheral neuropathy  - being treated with gabapentin, 300 BID, and tramadol, 50 mg every 6 hours as needed.     PLAN: Margan continues to do well with treatments. The labs were  reviewed in detail and were stable. Her next echocardiogram will be performed in late March. She will proceed with trastuzumab today as planned and anastrozole daily as well.   Again, I have refilled her oxycodone as her rheumatologist is waiting until the completion of trastuzumab to see her again to restart lyrica and remicade, which has worked in the past.   Tya is having reconstructive surgery on 3/1. She will continue with trastuzumab every 3 weeks through May. She understands and agrees with this plan. She knows the goal of treatment in her case is cure. She has been encouraged to call with any issues that might arise before her next visit here.   Marcelino Duster, NP  12/05/2014 11:51 AM

## 2014-12-05 NOTE — Telephone Encounter (Signed)
, °

## 2014-12-05 NOTE — Patient Instructions (Signed)

## 2014-12-05 NOTE — Patient Instructions (Signed)
[facility] Discharge Instructions for Patients Receiving Chemotherapy  Today you received the following chemotherapy agents:  Herceptin  To help prevent nausea and vomiting after your treatment, we encourage you to take your nausea medication as ordered per MD.   If you develop nausea and vomiting that is not controlled by your nausea medication, call the clinic.   BELOW ARE SYMPTOMS THAT SHOULD BE REPORTED IMMEDIATELY:  *FEVER GREATER THAN 100.5 F  *CHILLS WITH OR WITHOUT FEVER  NAUSEA AND VOMITING THAT IS NOT CONTROLLED WITH YOUR NAUSEA MEDICATION  *UNUSUAL SHORTNESS OF BREATH  *UNUSUAL BRUISING OR BLEEDING  TENDERNESS IN MOUTH AND THROAT WITH OR WITHOUT PRESENCE OF ULCERS  *URINARY PROBLEMS  *BOWEL PROBLEMS  UNUSUAL RASH Items with * indicate a potential emergency and should be followed up as soon as possible.  Feel free to call the clinic you have any questions or concerns. The clinic phone number is [phone].    

## 2014-12-06 NOTE — H&P (Signed)
  Subjective:    Patient ID: Angela Gross is a 64 y.o. female.  HPI  2 months post op placement implant, lipofilling and mastopexy. Feels right side larger, displaced laterally, no upper pole fullness. Not wearing bra, using camisole.  Path with negative nodes, 1.8 cm tumor. Er/PR+, Her 2 +. 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 Continues on herceptin, arimidex.   History significant for RA and Raynauds. [name] is her rheumatologist. Has held MTX and steroids throughout chemotherapy and no plans to start again.   Mastectomy wt 1194 g Left mastopexy wt 277 g Review of Systems     Objective:    Physical Exam  Cardiovascular: Normal rate, regular rhythm and normal heart sounds.  Pulmonary/Chest: Effort normal and breath sounds normal.  Abdominal: Soft.    SN to nipple L 23 cm BW R 15 L 17  Thinning over anterior flap, slight ripple visible Lateral displacement of implant when supine,soft no contracture. Loss of most of fat superior pole with depression in this area. Assessment:      R breast cancer , post chemo, on Herceptin Rheumatoid arthritis, Raynauds     Plan:      Reviewed current implant size. My opinion is that volume is symmetric and when we manually move implant medially, she is happier with appearance of cleavage. We will not proceed with NAC creation as scheduled but will do revision of right side with capsulorraphy, possible change of implant size. Will also do lipofilling repeat for mastectomy flaps and superior pole. We reviewed this will be similar to last procedure with drains, incisions. Plan OP procedure. New pictures taken.  Mentor Siltex Ultra High Profile 700 ml implant in right breast  Irene Limbo, MD [facility] Plastic & Reconstructive Surgery [phone]

## 2014-12-07 ENCOUNTER — Encounter (HOSPITAL_BASED_OUTPATIENT_CLINIC_OR_DEPARTMENT_OTHER): Payer: Self-pay | Admitting: *Deleted

## 2014-12-07 NOTE — Progress Notes (Signed)
Labs and ekg done 

## 2014-12-12 ENCOUNTER — Ambulatory Visit (HOSPITAL_BASED_OUTPATIENT_CLINIC_OR_DEPARTMENT_OTHER)
Admission: RE | Admit: 2014-12-12 | Discharge: 2014-12-12 | Disposition: A | Discharge status: Home / Self Care | Payer: Commercial Managed Care - HMO | Source: Ambulatory Visit | Attending: Plastic Surgery | Admitting: Plastic Surgery

## 2014-12-12 ENCOUNTER — Encounter (HOSPITAL_BASED_OUTPATIENT_CLINIC_OR_DEPARTMENT_OTHER)
Admission: RE | Disposition: A | Discharge status: Home / Self Care | Payer: Self-pay | Source: Ambulatory Visit | Attending: Plastic Surgery

## 2014-12-12 ENCOUNTER — Ambulatory Visit (HOSPITAL_BASED_OUTPATIENT_CLINIC_OR_DEPARTMENT_OTHER): Payer: Commercial Managed Care - HMO | Admitting: Anesthesiology

## 2014-12-12 ENCOUNTER — Encounter (HOSPITAL_BASED_OUTPATIENT_CLINIC_OR_DEPARTMENT_OTHER): Payer: Self-pay | Admitting: *Deleted

## 2014-12-12 DIAGNOSIS — Z170 Estrogen receptor positive status [ER+]: Secondary | ICD-10-CM | POA: Diagnosis not present

## 2014-12-12 DIAGNOSIS — Z9011 Acquired absence of right breast and nipple: Secondary | ICD-10-CM | POA: Diagnosis not present

## 2014-12-12 DIAGNOSIS — M069 Rheumatoid arthritis, unspecified: Secondary | ICD-10-CM | POA: Diagnosis not present

## 2014-12-12 DIAGNOSIS — N651 Disproportion of reconstructed breast: Secondary | ICD-10-CM | POA: Diagnosis not present

## 2014-12-12 DIAGNOSIS — C50911 Malignant neoplasm of unspecified site of right female breast: Secondary | ICD-10-CM | POA: Insufficient documentation

## 2014-12-12 DIAGNOSIS — I7300 Raynaud's syndrome without gangrene: Secondary | ICD-10-CM | POA: Insufficient documentation

## 2014-12-12 DIAGNOSIS — Z853 Personal history of malignant neoplasm of breast: Secondary | ICD-10-CM | POA: Diagnosis present

## 2014-12-12 HISTORY — PX: LIPOSUCTION WITH LIPOFILLING: SHX6436

## 2014-12-12 HISTORY — PX: BREAST RECONSTRUCTION: SHX9

## 2014-12-12 LAB — GLUCOSE, CAPILLARY: Glucose-Capillary: 117 mg/dL — ABNORMAL HIGH (ref 70–99)

## 2014-12-12 SURGERY — RECONSTRUCTION, BREAST
Anesthesia: General | Site: Chest | Laterality: Right

## 2014-12-12 MED ORDER — FENTANYL CITRATE 0.05 MG/ML IJ SOLN: 50.0000 ug | INTRAMUSCULAR | Status: DC | PRN

## 2014-12-12 MED ORDER — PROPOFOL 10 MG/ML IV EMUL
INTRAVENOUS | Status: AC
Start: 2014-12-12 — End: 2014-12-12
  Filled 2014-12-12: qty 100

## 2014-12-12 MED ORDER — OXYCODONE HCL 5 MG PO TABS: 5.0000 mg | ORAL_TABLET | ORAL | Status: DC | PRN

## 2014-12-12 MED ORDER — CEFAZOLIN SODIUM-DEXTROSE 2-3 GM-% IV SOLR
2.0000 g | INTRAVENOUS | Status: AC
  Administered 2014-12-12: 2 g via INTRAVENOUS

## 2014-12-12 MED ORDER — SCOPOLAMINE 1 MG/3DAYS TD PT72
MEDICATED_PATCH | TRANSDERMAL | Status: AC
  Filled 2014-12-12: qty 1

## 2014-12-12 MED ORDER — LIDOCAINE HCL 1 % IJ SOLN
INTRAVENOUS | Status: DC | PRN
  Administered 2014-12-12: 650 mL

## 2014-12-12 MED ORDER — HYDROMORPHONE HCL 1 MG/ML IJ SOLN
0.2500 mg | INTRAMUSCULAR | Status: DC | PRN
  Administered 2014-12-12 (×2): 0.25 mg via INTRAVENOUS

## 2014-12-12 MED ORDER — OXYCODONE HCL 5 MG PO TABS
ORAL_TABLET | ORAL | Status: AC
  Filled 2014-12-12: qty 1

## 2014-12-12 MED ORDER — PHENYLEPHRINE HCL 10 MG/ML IJ SOLN
INTRAMUSCULAR | Status: DC | PRN
  Administered 2014-12-12 (×2): 40 ug via INTRAVENOUS
  Administered 2014-12-12: 80 ug via INTRAVENOUS
  Administered 2014-12-12 (×2): 40 ug via INTRAVENOUS

## 2014-12-12 MED ORDER — PROPOFOL 10 MG/ML IV BOLUS
INTRAVENOUS | Status: AC
  Filled 2014-12-12: qty 80

## 2014-12-12 MED ORDER — SODIUM CHLORIDE 0.9 % IV SOLN
INTRAVENOUS | Status: DC | PRN
  Administered 2014-12-12: 1000 mL

## 2014-12-12 MED ORDER — SUCCINYLCHOLINE CHLORIDE 20 MG/ML IJ SOLN
INTRAMUSCULAR | Status: DC | PRN
  Administered 2014-12-12: 50 mg via INTRAVENOUS

## 2014-12-12 MED ORDER — OXYCODONE HCL 5 MG/5ML PO SOLN: 5.0000 mg | Freq: Once | ORAL | Status: AC | PRN

## 2014-12-12 MED ORDER — LACTATED RINGERS IV SOLN
INTRAVENOUS | Status: DC
  Administered 2014-12-12 (×2): via INTRAVENOUS

## 2014-12-12 MED ORDER — HYDROMORPHONE HCL 1 MG/ML IJ SOLN
INTRAMUSCULAR | Status: AC
  Filled 2014-12-12: qty 1

## 2014-12-12 MED ORDER — LIDOCAINE HCL (PF) 1 % IJ SOLN
INTRAMUSCULAR | Status: AC
  Filled 2014-12-12: qty 60

## 2014-12-12 MED ORDER — ONDANSETRON HCL 4 MG/2ML IJ SOLN
INTRAMUSCULAR | Status: DC | PRN
  Administered 2014-12-12: 4 mg via INTRAVENOUS

## 2014-12-12 MED ORDER — SODIUM CHLORIDE 0.9 % IJ SOLN
INTRAMUSCULAR | Status: AC
Start: 2014-12-12 — End: 2014-12-12
  Filled 2014-12-12: qty 10

## 2014-12-12 MED ORDER — CEFAZOLIN SODIUM-DEXTROSE 2-3 GM-% IV SOLR
INTRAVENOUS | Status: AC
  Filled 2014-12-12: qty 50

## 2014-12-12 MED ORDER — FENTANYL CITRATE 0.05 MG/ML IJ SOLN
INTRAMUSCULAR | Status: AC
  Filled 2014-12-12: qty 8

## 2014-12-12 MED ORDER — LIDOCAINE HCL (CARDIAC) 20 MG/ML IV SOLN
INTRAVENOUS | Status: DC | PRN
  Administered 2014-12-12: 50 mg via INTRAVENOUS

## 2014-12-12 MED ORDER — MIDAZOLAM HCL 2 MG/2ML IJ SOLN
INTRAMUSCULAR | Status: AC
  Filled 2014-12-12: qty 2

## 2014-12-12 MED ORDER — MIDAZOLAM HCL 2 MG/2ML IJ SOLN: 1.0000 mg | INTRAMUSCULAR | Status: DC | PRN

## 2014-12-12 MED ORDER — OXYCODONE HCL 5 MG PO TABS
5.0000 mg | ORAL_TABLET | Freq: Once | ORAL | Status: AC | PRN
  Administered 2014-12-12: 5 mg via ORAL

## 2014-12-12 MED ORDER — MIDAZOLAM HCL 5 MG/5ML IJ SOLN
INTRAMUSCULAR | Status: DC | PRN
  Administered 2014-12-12: 2 mg via INTRAVENOUS

## 2014-12-12 MED ORDER — FENTANYL CITRATE 0.05 MG/ML IJ SOLN
INTRAMUSCULAR | Status: DC | PRN
  Administered 2014-12-12 (×2): 50 ug via INTRAVENOUS
  Administered 2014-12-12: 25 ug via INTRAVENOUS

## 2014-12-12 MED ORDER — LIDOCAINE-EPINEPHRINE 1 %-1:100000 IJ SOLN
INTRAMUSCULAR | Status: AC
  Filled 2014-12-12: qty 1

## 2014-12-12 MED ORDER — BUPIVACAINE-EPINEPHRINE (PF) 0.25% -1:200000 IJ SOLN
INTRAMUSCULAR | Status: AC
  Filled 2014-12-12: qty 30

## 2014-12-12 MED ORDER — PROPOFOL 10 MG/ML IV BOLUS
INTRAVENOUS | Status: DC | PRN
  Administered 2014-12-12: 30 mg via INTRAVENOUS
  Administered 2014-12-12: 150 mg via INTRAVENOUS

## 2014-12-12 MED ORDER — SUCCINYLCHOLINE CHLORIDE 20 MG/ML IJ SOLN
INTRAMUSCULAR | Status: AC
  Filled 2014-12-12: qty 2

## 2014-12-12 MED ORDER — ONDANSETRON HCL 4 MG/2ML IJ SOLN: 4.0000 mg | Freq: Four times a day (QID) | INTRAMUSCULAR | Status: DC | PRN

## 2014-12-12 MED ORDER — EPINEPHRINE HCL 1 MG/ML IJ SOLN
INTRAMUSCULAR | Status: AC
  Filled 2014-12-12: qty 1

## 2014-12-12 MED ORDER — SCOPOLAMINE 1 MG/3DAYS TD PT72
MEDICATED_PATCH | TRANSDERMAL | Status: DC | PRN
  Administered 2014-12-12: 1 via TRANSDERMAL

## 2014-12-12 MED ORDER — SULFAMETHOXAZOLE-TRIMETHOPRIM 800-160 MG PO TABS: 1.0000 | ORAL_TABLET | Freq: Two times a day (BID) | ORAL | Status: DC

## 2014-12-12 SURGICAL SUPPLY — 84 items
BAG DECANTER FOR FLEXI CONT (MISCELLANEOUS) ×4 IMPLANT
BANDAGE ELASTIC 6 VELCRO ST LF (GAUZE/BANDAGES/DRESSINGS) IMPLANT
BINDER ABDOMINAL 10 UNV 27-48 (MISCELLANEOUS) ×4 IMPLANT
BINDER ABDOMINAL 12 SM 30-45 (SOFTGOODS) IMPLANT
BINDER BREAST LRG (GAUZE/BANDAGES/DRESSINGS) IMPLANT
BINDER BREAST MEDIUM (GAUZE/BANDAGES/DRESSINGS) IMPLANT
BINDER BREAST XLRG (GAUZE/BANDAGES/DRESSINGS) ×4 IMPLANT
BINDER BREAST XXLRG (GAUZE/BANDAGES/DRESSINGS) IMPLANT
BLADE SURG 11 STRL SS (BLADE) ×4 IMPLANT
BLADE SURG 15 STRL LF DISP TIS (BLADE) ×2 IMPLANT
BLADE SURG 15 STRL SS (BLADE) ×2
BNDG GAUZE ELAST 4 BULKY (GAUZE/BANDAGES/DRESSINGS) ×8 IMPLANT
CANISTER LIPO FAT HARVEST (MISCELLANEOUS) ×4 IMPLANT
CANISTER SUCT 1200ML W/VALVE (MISCELLANEOUS) ×4 IMPLANT
CHLORAPREP W/TINT 26ML (MISCELLANEOUS) ×8 IMPLANT
COVER BACK TABLE 60X90IN (DRAPES) ×4 IMPLANT
COVER MAYO STAND STRL (DRAPES) ×4 IMPLANT
DECANTER SPIKE VIAL GLASS SM (MISCELLANEOUS) IMPLANT
DRAIN CHANNEL 15F RND FF W/TCR (WOUND CARE) ×4 IMPLANT
DRAPE LAPAROSCOPIC ABDOMINAL (DRAPES) ×4 IMPLANT
DRSG PAD ABDOMINAL 8X10 ST (GAUZE/BANDAGES/DRESSINGS) ×16 IMPLANT
DRSG TEGADERM 2-3/8X2-3/4 SM (GAUZE/BANDAGES/DRESSINGS) ×8 IMPLANT
ELECT BLADE 4.0 EZ CLEAN MEGAD (MISCELLANEOUS) ×4
ELECT COATED BLADE 2.86 ST (ELECTRODE) ×4 IMPLANT
ELECT REM PT RETURN 9FT ADLT (ELECTROSURGICAL) ×4
ELECTRODE BLDE 4.0 EZ CLN MEGD (MISCELLANEOUS) ×2 IMPLANT
ELECTRODE REM PT RTRN 9FT ADLT (ELECTROSURGICAL) ×2 IMPLANT
EVACUATOR SILICONE 100CC (DRAIN) ×4 IMPLANT
FILTER LIPOSUCTION (MISCELLANEOUS) ×4 IMPLANT
GLOVE BIO SURGEON STRL SZ 6 (GLOVE) ×8 IMPLANT
GLOVE BIO SURGEON STRL SZ 6.5 (GLOVE) IMPLANT
GLOVE BIO SURGEONS STRL SZ 6.5 (GLOVE)
GLOVE BIOGEL PI IND STRL 7.0 (GLOVE) ×2 IMPLANT
GLOVE BIOGEL PI INDICATOR 7.0 (GLOVE) ×2
GLOVE ECLIPSE 6.5 STRL STRAW (GLOVE) ×4 IMPLANT
GLOVE EXAM NITRILE EXT CUFF MD (GLOVE) ×4 IMPLANT
GOWN STRL REUS W/ TWL LRG LVL3 (GOWN DISPOSABLE) ×4 IMPLANT
GOWN STRL REUS W/TWL LRG LVL3 (GOWN DISPOSABLE) ×4
IMPLANT BREAST GEL HP 650CC (Breast) ×4 IMPLANT
IV NS 500ML (IV SOLUTION)
IV NS 500ML BAXH (IV SOLUTION) IMPLANT
KIT FILL SYSTEM UNIVERSAL (SET/KITS/TRAYS/PACK) IMPLANT
LINER CANISTER 1000CC FLEX (MISCELLANEOUS) ×4 IMPLANT
LIQUID BAND (GAUZE/BANDAGES/DRESSINGS) ×8 IMPLANT
NDL SAFETY ECLIPSE 18X1.5 (NEEDLE) ×2 IMPLANT
NEEDLE HYPO 18GX1.5 SHARP (NEEDLE) ×2
NEEDLE HYPO 25X1 1.5 SAFETY (NEEDLE) IMPLANT
NS IRRIG 1000ML POUR BTL (IV SOLUTION) ×8 IMPLANT
PACK BASIN DAY SURGERY FS (CUSTOM PROCEDURE TRAY) ×4 IMPLANT
PACK UNIVERSAL I (CUSTOM PROCEDURE TRAY) ×4 IMPLANT
PAD ALCOHOL SWAB (MISCELLANEOUS) ×4 IMPLANT
PENCIL BUTTON HOLSTER BLD 10FT (ELECTRODE) ×4 IMPLANT
PIN SAFETY STERILE (MISCELLANEOUS) ×4 IMPLANT
SHEET MEDIUM DRAPE 40X70 STRL (DRAPES) ×8 IMPLANT
SIZER BREAST REUSE 650CC (SIZER) ×2
SIZER BRST REUSE P6.4 650CC (SIZER) ×2 IMPLANT
SIZER GENERIC MENTOR (SIZER) ×4 IMPLANT
SLEEVE SCD COMPRESS KNEE MED (MISCELLANEOUS) ×4 IMPLANT
SPONGE LAP 18X18 X RAY DECT (DISPOSABLE) ×12 IMPLANT
STAPLER VISISTAT 35W (STAPLE) ×4 IMPLANT
SUT ETHILON 2 0 FS 18 (SUTURE) ×4 IMPLANT
SUT MNCRL AB 4-0 PS2 18 (SUTURE) ×4 IMPLANT
SUT PDS 3-0 CT2 (SUTURE)
SUT PDS AB 2-0 CT2 27 (SUTURE) IMPLANT
SUT PDS II 3-0 CT2 27 ABS (SUTURE) IMPLANT
SUT PROLENE 2 0 CT2 30 (SUTURE) ×8 IMPLANT
SUT VIC AB 3-0 PS1 18 (SUTURE)
SUT VIC AB 3-0 PS1 18XBRD (SUTURE) IMPLANT
SUT VIC AB 3-0 SH 27 (SUTURE) ×2
SUT VIC AB 3-0 SH 27X BRD (SUTURE) ×2 IMPLANT
SUT VICRYL 4-0 PS2 18IN ABS (SUTURE) ×4 IMPLANT
SYR 20CC LL (SYRINGE) IMPLANT
SYR 50ML LL SCALE MARK (SYRINGE) ×20 IMPLANT
SYR BULB IRRIGATION 50ML (SYRINGE) ×4 IMPLANT
SYR CONTROL 10ML LL (SYRINGE) IMPLANT
SYR TB 1ML LL NO SAFETY (SYRINGE) IMPLANT
SYRINGE 10CC LL (SYRINGE) ×12 IMPLANT
TOWEL OR 17X24 6PK STRL BLUE (TOWEL DISPOSABLE) ×8 IMPLANT
TUBE CONNECTING 20'X1/4 (TUBING) ×1
TUBE CONNECTING 20X1/4 (TUBING) ×3 IMPLANT
TUBING INFILTRATION IT-10001 (TUBING) ×4 IMPLANT
TUBING SET GRADUATE ASPIR 12FT (MISCELLANEOUS) ×4 IMPLANT
UNDERPAD 30X30 INCONTINENT (UNDERPADS AND DIAPERS) ×8 IMPLANT
YANKAUER SUCT BULB TIP NO VENT (SUCTIONS) ×4 IMPLANT

## 2014-12-12 NOTE — Discharge Instructions (Signed)
°  Post Anesthesia Home Care Instructions  Activity: Get plenty of rest for the remainder of the day. A responsible adult should stay with you for 24 hours following the procedure.  For the next 24 hours, DO NOT: -Drive a car -Paediatric nurse -Drink alcoholic beverages -Take any medication unless instructed by your physician -Make any legal decisions or sign important papers.  Meals: Start with liquid foods such as gelatin or soup. Progress to regular foods as tolerated. Avoid greasy, spicy, heavy foods. If nausea and/or vomiting occur, drink only clear liquids until the nausea and/or vomiting subsides. Call your physician if vomiting continues.  Special Instructions/Symptoms: Your throat may feel dry or sore from the anesthesia or the breathing tube placed in your throat during surgery. If this causes discomfort, gargle with warm salt water. The discomfort should disappear within 24 hours.       JP Drain [employer] this sheet to all of your post-operative appointments while you have your drains.  Please measure your drains by CC's or ML's.  Make sure you drain and measure your JP Drains 2 or 3 times per day.  At the end of each day, add up totals for the left side and add up totals for the right side.    ( 9 am )     ( 3 pm )        ( 9 pm )                Date L  R  L  R  L  R  Total L/R

## 2014-12-12 NOTE — Anesthesia Preprocedure Evaluation (Signed)
Anesthesia Evaluation  Patient identified by MRN, date of birth, ID band Patient awake    Reviewed: Allergy & Precautions, NPO status , Patient's Chart, lab work & pertinent test results  History of Anesthesia Complications (+) PONV  Airway Mallampati: II   Neck ROM: full    Dental   Pulmonary  breath sounds clear to auscultation        Cardiovascular hypertension, + Peripheral Vascular Disease Rhythm:regular Rate:Normal     Neuro/Psych Anxiety  Neuromuscular disease    GI/Hepatic   Endo/Other  diabetes, Type 2obese  Renal/GU      Musculoskeletal  (+) Arthritis -, Fibromyalgia -  Abdominal   Peds  Hematology   Anesthesia Other Findings   Reproductive/Obstetrics                             Anesthesia Physical Anesthesia Plan  ASA: II  Anesthesia Plan: General   Post-op Pain Management:    Induction: Intravenous  Airway Management Planned: Oral ETT  Additional Equipment:   Intra-op Plan:   Post-operative Plan: Extubation in OR  Informed Consent: I have reviewed the patients History and Physical, chart, labs and discussed the procedure including the risks, benefits and alternatives for the proposed anesthesia with the patient or authorized representative who has indicated his/her understanding and acceptance.     Plan Discussed with: CRNA, Anesthesiologist and Surgeon  Anesthesia Plan Comments:         Anesthesia Quick Evaluation

## 2014-12-12 NOTE — Op Note (Signed)
Operative Note   DATE OF OPERATION: 3.1.2016  LOCATION: [facility]-outpatient  SURGICAL DIVISION: Plastic Surgery  PREOPERATIVE DIAGNOSES:  1. History right breast cancer 2. Acquired absence breast 3. Asymmetry native and reconstructed breast  POSTOPERATIVE DIAGNOSES:  same  PROCEDURE:  1. Revision to right reconstructed breast with capsulorraphy, exchange of silicone implant and liposuction to right lateral chest wall. 2. Lipofilling to right reconstructed breast  SURGEON: Irene Limbo MD MBA  ASSISTANT: none  ANESTHESIA:  General.   EBL: 50 ml  COMPLICATIONS: None immediate.   INDICATIONS FOR PROCEDURE:  The patient, Angela Gross, is a 64 y.o. female born on Sep 07, 1951, is here for revision right breast reconstruction. Patient notes asymmetry of volume and lateral displacement of implant.   FINDINGS: Previous 700 ml implant exchanged for Mentor Siltex Ultra High P2ofile 650 ml implant. Ref [id number] SN [id number]. 120 ml fat grafted to right breast. Approximately 50 ml of lipoaspirate from lateral chest wall over right to aid with shaping of breast.   DESCRIPTION OF PROCEDURE:  The patient was marked with the patient in the preoperative area in standing position to mark areas over supraumbilical abdomen and bilateral flanks for fat harvest, chest midline, sternal notch, anterior axillary lines, and inframammary folds. The patient was taken to the operating room. SCDs were placed and IV antibiotics were given. The patient's operative site was prepped and draped in a sterile fashion. A time out was performed and all information was confirmed to be correct. Tumescent solution infiltrated over bilateral flanks and supraumbilical abdomen through stab incisions. Additional tumescent infiltrated over right lateral chest wall. Total of 500 ml fluid infiltrated. Power assisted liposuction completed over suprumbilical abdomen and bilateral flanks to endpoint of symmetric contour  and soft tissue thickness. Fat prepared for infiltration with washing and gravity. Stab incisions approximated with interrupted 4-0 monocryl.  I then directed attention to right breast. Incision made through prior transverse scar and capsule entered, and implant removed. Limited capsulectomy performed over right lateral chest wall to aid with capsulorraphy. Running and interrupted 2-0 Prolene suture used to recreate desired lateral extent of pocket and to set inframammary fold. Mentor Ultra High Profile 650 ml sizer placed in cavity and pocket dimension and location on chest assessed.   Using blunt canula, prepared fat placed subcutaneously and intramuscular over superior poles and within both superior and inferior mastectomy flaps. Breast cavity irrigated with solution containing ancef, bacitracin, and gentamicin. Hemostasis obtained. 15 Fr drain placed in cavity. Few mm of skin deepithelialized at skin margins.  Implant placed and layered closure completed with 3-0 vicryl to approximate capsule.4-0 vicryl placed in dermis followed by running 4-0 monocryl subcuticular for skin closure.Tissue adhesive, followed by dry dressing and breast and abdominal binders applied.  The patient was allowed to wake from anesthesia, extubated and taken to the recovery room in satisfactory condition.   SPECIMENS: none  DRAINS: 15 Fr JP in right reconstructed breast  Irene Limbo, MD [facility] Plastic & Reconstructive Surgery [phone]

## 2014-12-12 NOTE — Transfer of Care (Signed)
Immediate Anesthesia Transfer of Care Note  Patient: [name]  Procedure(s) Performed: Procedure(s): REVISION OF RIGHT RECONSTRUCTIVE BREAST WITH CAPSULORRHAPHY PLACEMENT OF SILCONE IMPLANTS AND LIPOFILLING TO RIGHT CHEST  (Right) LIPOSUCTION WITH LIPOFILLING (Right)  Patient Location: PACU  Anesthesia Type:General  Level of Consciousness: sedated and patient cooperative  Airway & Oxygen Therapy: Patient Spontanous Breathing and Patient connected to face mask oxygen  Post-op Assessment: Report given to RN and Post -op Vital signs reviewed and stable  Post vital signs: Reviewed and stable  Last Vitals:  Filed Vitals:   12/12/14 0958  BP:   Pulse: 80  Temp:   Resp: 16    Complications: No apparent anesthesia complications

## 2014-12-12 NOTE — Interval H&P Note (Signed)
History and Physical Interval Note:  12/12/2014 7:03 AM  Angela Gross  has presented today for surgery, with the diagnosis of HISTORY OF RIGHT BREAST CANCER/ACQUIRED ABSENCE BREAST/NIPPLE  The various methods of treatment have been discussed with the patient and family. After consideration of risks, benefits and other options for treatment, the patient has consented to  Procedure(s): REVISION OF RIGHT RECONSTRUCTIVE BREAST WITH CAPSULORRHAPHY PLACEMENT OF SILCONE IMPLANTS AND LIPOFILLING TO RIGHT CHEST  (Right) LIPOSUCTION WITH LIPOFILLING (Right) as a surgical intervention .  The patient's history has been reviewed, patient examined, no change in status, stable for surgery.  I have reviewed the patient's chart and labs.  Questions were answered to the patient's satisfaction.     Gearldean Lomanto

## 2014-12-12 NOTE — Anesthesia Procedure Notes (Signed)
Procedure Name: Intubation Date/Time: 12/12/2014 7:42 AM Performed by: Marrianne Mood Pre-anesthesia Checklist: Patient identified, Emergency Drugs available, Suction available, Patient being monitored and Timeout performed Patient Re-evaluated:Patient Re-evaluated prior to inductionOxygen Delivery Method: Circle System Utilized Preoxygenation: Pre-oxygenation with 100% oxygen Intubation Type: IV induction Ventilation: Mask ventilation without difficulty Laryngoscope Size: Miller and 3 Grade View: Grade II Tube type: Oral Tube size: 7.0 mm Number of attempts: 1 Airway Equipment and Method: Stylet and Oral airway Placement Confirmation: ETT inserted through vocal cords under direct vision,  positive ETCO2 and breath sounds checked- equal and bilateral Secured at: 20 cm Tube secured with: Tape Dental Injury: Teeth and Oropharynx as per pre-operative assessment

## 2014-12-12 NOTE — Anesthesia Postprocedure Evaluation (Signed)
Anesthesia Post Note  Patient: Angela Gross  Procedure(s) Performed: Procedure(s) (LRB): REVISION OF RIGHT RECONSTRUCTIVE BREAST WITH CAPSULORRHAPHY PLACEMENT OF SILCONE IMPLANTS AND LIPOFILLING TO RIGHT CHEST  (Right) LIPOSUCTION WITH LIPOFILLING (Right)  Anesthesia type: General  Patient location: PACU  Post pain: Pain level controlled and Adequate analgesia  Post assessment: Post-op Vital signs reviewed, Patient's Cardiovascular Status Stable, Respiratory Function Stable, Patent Airway and Pain level controlled  Last Vitals:  Filed Vitals:   12/12/14 1200  BP: 143/95  Pulse: 73  Temp: 36.6 C  Resp: 20    Post vital signs: Reviewed and stable  Level of consciousness: awake, alert  and oriented  Complications: No apparent anesthesia complications

## 2014-12-13 ENCOUNTER — Encounter (HOSPITAL_BASED_OUTPATIENT_CLINIC_OR_DEPARTMENT_OTHER): Payer: Self-pay | Admitting: Plastic Surgery

## 2014-12-26 ENCOUNTER — Ambulatory Visit: Payer: Medicare HMO

## 2014-12-26 ENCOUNTER — Ambulatory Visit (HOSPITAL_BASED_OUTPATIENT_CLINIC_OR_DEPARTMENT_OTHER): Payer: Medicare HMO

## 2014-12-26 ENCOUNTER — Other Ambulatory Visit (HOSPITAL_BASED_OUTPATIENT_CLINIC_OR_DEPARTMENT_OTHER): Payer: Medicare HMO

## 2014-12-26 ENCOUNTER — Other Ambulatory Visit: Payer: Medicare Other

## 2014-12-26 DIAGNOSIS — Z95828 Presence of other vascular implants and grafts: Secondary | ICD-10-CM

## 2014-12-26 DIAGNOSIS — C50311 Malignant neoplasm of lower-inner quadrant of right female breast: Secondary | ICD-10-CM

## 2014-12-26 DIAGNOSIS — C50811 Malignant neoplasm of overlapping sites of right female breast: Secondary | ICD-10-CM

## 2014-12-26 DIAGNOSIS — Z5112 Encounter for antineoplastic immunotherapy: Secondary | ICD-10-CM

## 2014-12-26 LAB — COMPREHENSIVE METABOLIC PANEL (CC13)
ALBUMIN: 3.5 g/dL (ref 3.5–5.0)
ALK PHOS: 108 U/L (ref 40–150)
ALT: 10 U/L (ref 0–55)
AST: 13 U/L (ref 5–34)
Anion Gap: 11 mEq/L (ref 3–11)
BUN: 11.1 mg/dL (ref 7.0–26.0)
CALCIUM: 9.4 mg/dL (ref 8.4–10.4)
CHLORIDE: 107 meq/L (ref 98–109)
CO2: 25 mEq/L (ref 22–29)
Creatinine: 0.9 mg/dL (ref 0.6–1.1)
EGFR: 80 mL/min/{1.73_m2} — ABNORMAL LOW (ref >90)
Glucose: 133 mg/dl (ref 70–140)
POTASSIUM: 3.6 meq/L (ref 3.5–5.1)
Sodium: 143 mEq/L (ref 136–145)
Total Bilirubin: 0.29 mg/dL (ref 0.20–1.20)
Total Protein: 7.4 g/dL (ref 6.4–8.3)

## 2014-12-26 MED ORDER — SODIUM CHLORIDE 0.9 % IJ SOLN
10.0000 mL | INTRAMUSCULAR | Status: DC | PRN
  Administered 2014-12-26: 10 mL
  Filled 2014-12-26: qty 10

## 2014-12-26 MED ORDER — HEPARIN SOD (PORK) LOCK FLUSH 100 UNIT/ML IV SOLN
500.0000 [IU] | Freq: Once | INTRAVENOUS | Status: AC | PRN
  Administered 2014-12-26: 500 [IU]
  Filled 2014-12-26: qty 5

## 2014-12-26 MED ORDER — DIPHENHYDRAMINE HCL 25 MG PO CAPS
25.0000 mg | ORAL_CAPSULE | Freq: Once | ORAL | Status: AC
  Administered 2014-12-26: 25 mg via ORAL

## 2014-12-26 MED ORDER — SODIUM CHLORIDE 0.9 % IV SOLN
Freq: Once | INTRAVENOUS | Status: AC
  Administered 2014-12-26: 12:00:00 via INTRAVENOUS

## 2014-12-26 MED ORDER — SODIUM CHLORIDE 0.9 % IV SOLN
6.0000 mg/kg | Freq: Once | INTRAVENOUS | Status: AC
  Administered 2014-12-26: 420 mg via INTRAVENOUS
  Filled 2014-12-26: qty 20

## 2014-12-26 MED ORDER — ACETAMINOPHEN 325 MG PO TABS
650.0000 mg | ORAL_TABLET | Freq: Once | ORAL | Status: AC
  Administered 2014-12-26: 650 mg via ORAL

## 2014-12-26 MED ORDER — DIPHENHYDRAMINE HCL 25 MG PO CAPS
ORAL_CAPSULE | ORAL | Status: AC
  Filled 2014-12-26: qty 2

## 2014-12-26 MED ORDER — SODIUM CHLORIDE 0.9 % IJ SOLN
10.0000 mL | INTRAMUSCULAR | Status: DC | PRN
  Administered 2014-12-26: 10 mL via INTRAVENOUS
  Filled 2014-12-26: qty 10

## 2014-12-26 MED ORDER — ACETAMINOPHEN 325 MG PO TABS
ORAL_TABLET | ORAL | Status: AC
  Filled 2014-12-26: qty 2

## 2014-12-26 NOTE — Patient Instructions (Signed)

## 2014-12-26 NOTE — Patient Instructions (Signed)
[facility] Discharge Instructions for Patients Receiving Chemotherapy  Today you received the following chemotherapy agents Herceptin.  To help prevent nausea and vomiting after your treatment, we encourage you to take your nausea medication as prescribed.   If you develop nausea and vomiting that is not controlled by your nausea medication, call the clinic.   BELOW ARE SYMPTOMS THAT SHOULD BE REPORTED IMMEDIATELY:  *FEVER GREATER THAN 100.5 F  *CHILLS WITH OR WITHOUT FEVER  NAUSEA AND VOMITING THAT IS NOT CONTROLLED WITH YOUR NAUSEA MEDICATION  *UNUSUAL SHORTNESS OF BREATH  *UNUSUAL BRUISING OR BLEEDING  TENDERNESS IN MOUTH AND THROAT WITH OR WITHOUT PRESENCE OF ULCERS  *URINARY PROBLEMS  *BOWEL PROBLEMS  UNUSUAL RASH Items with * indicate a potential emergency and should be followed up as soon as possible.  Feel free to call the clinic you have any questions or concerns. The clinic phone number is [phone].    

## 2014-12-27 ENCOUNTER — Telehealth: Payer: Self-pay | Admitting: *Deleted

## 2014-12-27 NOTE — Telephone Encounter (Signed)
Patient called and left message.  She is taking gabepentin three times daily.  Her prescription reads twice a day, so she is taking more and needs a refill and needs Gentry Fitz NP  to change the directions to three times a day. Pharmacy is [facility] on [address].   Heather's last note from 12-05-14 reads twice a day for the gabapentin.  Will route this to [facility] for her attention. (also to [employer] LPN with [place])

## 2014-12-27 NOTE — Telephone Encounter (Signed)
Ok to increase to TID.

## 2014-12-29 ENCOUNTER — Other Ambulatory Visit: Payer: Self-pay | Admitting: *Deleted

## 2014-12-29 DIAGNOSIS — N951 Menopausal and female climacteric states: Secondary | ICD-10-CM

## 2014-12-29 DIAGNOSIS — T451X5A Adverse effect of antineoplastic and immunosuppressive drugs, initial encounter: Principal | ICD-10-CM

## 2014-12-29 DIAGNOSIS — G620 Drug-induced polyneuropathy: Secondary | ICD-10-CM

## 2014-12-29 MED ORDER — GABAPENTIN 300 MG PO CAPS: 300.0000 mg | ORAL_CAPSULE | Freq: Three times a day (TID) | ORAL | Status: DC

## 2015-01-07 ENCOUNTER — Other Ambulatory Visit: Payer: Self-pay | Admitting: Oncology

## 2015-01-08 ENCOUNTER — Encounter (HOSPITAL_COMMUNITY): Payer: Self-pay

## 2015-01-08 ENCOUNTER — Ambulatory Visit (HOSPITAL_BASED_OUTPATIENT_CLINIC_OR_DEPARTMENT_OTHER)
Admission: RE | Admit: 2015-01-08 | Discharge: 2015-01-08 | Disposition: A | Discharge status: Home / Self Care | Payer: Medicare HMO | Source: Ambulatory Visit | Attending: Internal Medicine | Admitting: Internal Medicine

## 2015-01-08 ENCOUNTER — Ambulatory Visit (HOSPITAL_COMMUNITY)
Admission: RE | Admit: 2015-01-08 | Discharge: 2015-01-08 | Disposition: A | Discharge status: Home / Self Care | Payer: Medicare HMO | Source: Ambulatory Visit | Attending: Internal Medicine | Admitting: Internal Medicine

## 2015-01-08 VITALS — BP 118/68 | HR 77 | Wt 162.5 lb

## 2015-01-08 DIAGNOSIS — T451X5A Adverse effect of antineoplastic and immunosuppressive drugs, initial encounter: Secondary | ICD-10-CM | POA: Insufficient documentation

## 2015-01-08 DIAGNOSIS — I427 Cardiomyopathy due to drug and external agent: Secondary | ICD-10-CM

## 2015-01-08 DIAGNOSIS — Z5111 Encounter for antineoplastic chemotherapy: Secondary | ICD-10-CM | POA: Diagnosis not present

## 2015-01-08 DIAGNOSIS — C50311 Malignant neoplasm of lower-inner quadrant of right female breast: Secondary | ICD-10-CM | POA: Diagnosis not present

## 2015-01-08 NOTE — Progress Notes (Signed)
  Echocardiogram 2D Echocardiogram has been performed.  Darlina Sicilian M 01/08/2015, 3:59 PM

## 2015-01-08 NOTE — Patient Instructions (Signed)
Follow up in June with echocardiogram.

## 2015-01-08 NOTE — Progress Notes (Signed)
Patient ID: Angela Gross, female   DOB: 1951/06/10, 64 y.o.   MRN: [id number] Primary oncologist: Dr. Jana Hakim  64 yo with history of breast cancer diagnosed in 12/14 s/p mastectomy presents to cardio-oncology clinic.  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  She has no significant cardiac history.  Her mother had CHF but we do not know the cause.    She was started on carboplatin, docetaxel, and Herceptin in 4/15 but only tolerated 2 cycles.  Then switched to Herceptin/Perjeta and Anastazole. Had 4 cycles and then Herceptin/Perjeta stopped due to cardiotoxicity.  Herceptin was later restarted.   She returns for follow up. Mild dyspnea with exertion only if she is in a hurry otherwise breathing is ok.   She will be getting Herceptin until 02/27/15.     Labs (9/15): K 3.5, creatinine 1.1 Labs 12/25/2013: K 3.6 Creatinine 0.9   PMH: 1. Breast cancer: Diagnosed in 12/14.  ER+/PR-/HER2neu+.  She is now s/p right mastectomy.  - Echo (3/15) with EF 55-60%, lateral s' 11 cm/sec (no strain) - ECHO 05/08/14 EF ~ 50% Lateral S' 9.2 GLS -15.9  - ECHO 06/08/14 EF 55% Lateral s' 7.2 cm/sec GLS - 17.3 - ECHO 9/15 EF 50-55%, Lateral s' 10, GLS -15 - ECHO 11/15 EF 49-70%, normal diastolic function, lateral s' 11.3, GLS -16.5%, normla RV size and systolic function.  - ECHO 1/16 EF 50-55%, lateral s' 11.1 (difficult), GLS -15.9%   ECHO 01/08/2015: EF 50-55% Lateral S' 10.1 (poor window) GLS - 15.6%  2. Rheumatoid arthritis 3. Raynauds syndrome 4. OA 5. Degenerative disc disease: C-spine  SH: Lives with husband in [place], nonsmoker.   FH: Mother with CHF.   ROS: All systems reviewed and negative except as per HPI.   Current Outpatient Prescriptions  Medication Sig Dispense Refill  . anastrozole (ARIMIDEX) 1 MG tablet Take 1 tablet (1 mg total) by mouth daily. 30 tablet 12  . carvedilol (COREG) 12.5 MG tablet Take 1 tablet (12.5 mg total) by mouth 2 (two) times daily with a meal. 60 tablet 3  .  cyclobenzaprine (FLEXERIL) 10 MG tablet Take 1 tablet (10 mg total) by mouth 3 (three) times daily as needed for muscle spasms. 30 tablet 1  . gabapentin (NEURONTIN) 300 MG capsule Take 1 capsule (300 mg total) by mouth 3 (three) times daily. For neuropathy and hot flashes 270 capsule 1  . lidocaine-prilocaine (EMLA) cream Apply 1 application topically as needed. 1-2 hrs before port access 30 g 4  . lisinopril (PRINIVIL,ZESTRIL) 10 MG tablet Take 1 tablet (10 mg total) by mouth daily. 90 tablet 3  . oxyCODONE (OXY IR/ROXICODONE) 5 MG immediate release tablet Take 1 tablet (5 mg total) by mouth every 4 (four) hours as needed for severe pain. 50 tablet 0  . sulfamethoxazole-trimethoprim (BACTRIM DS,SEPTRA DS) 800-160 MG per tablet Take 1 tablet by mouth 2 (two) times daily. 14 tablet 0  . traMADol (ULTRAM) 50 MG tablet Take 1 tablet (50 mg total) by mouth 3 (three) times daily as needed (pain). 90 tablet 1   No current facility-administered medications for this encounter.   BP 118/68 mmHg  Pulse 77  Wt 162 lb 8 oz (73.71 kg)  SpO2 99% General: NAD.  Neck: No JVD, no thyromegaly or thyroid nodule.  Lungs: Clear to auscultation bilaterally with normal respiratory effort. CV: Nondisplaced PMI.  Heart regular S1/S2, no S3/S4, no murmur.  No peripheral edema.  No carotid bruit.  Normal pedal pulses.  Abdomen: Soft, nontender,  no hepatosplenomegaly, no distention.  Skin: Intact without lesions or rashes.  Neurologic: Alert and oriented x 3.  Psych: Normal affect. Extremities: No clubbing or cyanosis.  HEENT: Normal.   Assessment/Plan: 1. R Breast Cancer- mT1 N0, stage IA invasive ductal carcinoma. Herceptin stopped 7/15 due to drop in EF and restarted in 9/15.  2. Chemotherapy-induced cardiomyopathy: Herceptin-related.  Completes Herceptin in May.   Dr Haroldine Laws discussed and reviewed ECHO. EF and doppler parameters stable. Continue herceptin.   Continue current Coreg 12.5 mg twice a day and  lisinopril 10 mg dialy.   Plan to repeat one last ECHO in June.   CLEGG,AMYNP-C 01/08/2015   Patient seen and examined with Darrick Grinder, NP. We discussed all aspects of the encounter. I agree with the assessment and plan as stated above.   I reviewed echo images personally in clinic. EF stable 50-55%. GLS stable. Lateral s' poor window. Overall heart function felt to be stable. Has 3 more Herceptin treatments. Ok to complete. Will repeat echo at end of therapy. Continue current regimen.   Daniel Bensimhon,MD 10:28 PM

## 2015-01-11 ENCOUNTER — Emergency Department (HOSPITAL_COMMUNITY): Payer: Medicare HMO

## 2015-01-11 ENCOUNTER — Emergency Department (HOSPITAL_COMMUNITY)
Admission: EM | Admit: 2015-01-11 | Discharge: 2015-01-11 | Disposition: A | Discharge status: Home / Self Care | Payer: Medicare HMO | Attending: Emergency Medicine | Admitting: Emergency Medicine

## 2015-01-11 ENCOUNTER — Encounter (HOSPITAL_COMMUNITY): Payer: Self-pay | Admitting: *Deleted

## 2015-01-11 DIAGNOSIS — Z853 Personal history of malignant neoplasm of breast: Secondary | ICD-10-CM | POA: Diagnosis not present

## 2015-01-11 DIAGNOSIS — G8929 Other chronic pain: Secondary | ICD-10-CM | POA: Diagnosis not present

## 2015-01-11 DIAGNOSIS — Z79899 Other long term (current) drug therapy: Secondary | ICD-10-CM | POA: Diagnosis not present

## 2015-01-11 DIAGNOSIS — E119 Type 2 diabetes mellitus without complications: Secondary | ICD-10-CM | POA: Diagnosis not present

## 2015-01-11 DIAGNOSIS — I10 Essential (primary) hypertension: Secondary | ICD-10-CM | POA: Diagnosis not present

## 2015-01-11 DIAGNOSIS — R338 Other retention of urine: Secondary | ICD-10-CM

## 2015-01-11 DIAGNOSIS — M1990 Unspecified osteoarthritis, unspecified site: Secondary | ICD-10-CM | POA: Diagnosis not present

## 2015-01-11 DIAGNOSIS — N3001 Acute cystitis with hematuria: Secondary | ICD-10-CM | POA: Diagnosis not present

## 2015-01-11 DIAGNOSIS — R339 Retention of urine, unspecified: Secondary | ICD-10-CM | POA: Diagnosis present

## 2015-01-11 LAB — URINALYSIS, ROUTINE W REFLEX MICROSCOPIC
Bilirubin Urine: NEGATIVE
GLUCOSE, UA: NEGATIVE mg/dL
Ketones, ur: NEGATIVE mg/dL
NITRITE: NEGATIVE
PROTEIN: NEGATIVE mg/dL
Specific Gravity, Urine: 1.007 (ref 1.005–1.030)
Urobilinogen, UA: 0.2 mg/dL (ref 0.0–1.0)
pH: 6.5 (ref 5.0–8.0)

## 2015-01-11 LAB — URINE MICROSCOPIC-ADD ON

## 2015-01-11 MED ORDER — LIDOCAINE HCL (PF) 1 % IJ SOLN
INTRAMUSCULAR | Status: AC
  Administered 2015-01-11: 5 mL
  Filled 2015-01-11: qty 5

## 2015-01-11 MED ORDER — CEFTRIAXONE SODIUM 1 G IJ SOLR
1.0000 g | Freq: Once | INTRAMUSCULAR | Status: AC
  Administered 2015-01-11: 1 g via INTRAMUSCULAR
  Filled 2015-01-11: qty 10

## 2015-01-11 MED ORDER — CEPHALEXIN 500 MG PO CAPS: 500.0000 mg | ORAL_CAPSULE | Freq: Three times a day (TID) | ORAL | Status: DC

## 2015-01-11 MED ORDER — GADOBENATE DIMEGLUMINE 529 MG/ML IV SOLN
15.0000 mL | Freq: Once | INTRAVENOUS | Status: AC | PRN
  Administered 2015-01-11: 15 mL via INTRAVENOUS

## 2015-01-11 NOTE — Discharge Instructions (Signed)
Acute Urinary Retention Acute urinary retention is the temporary inability to urinate. This is an uncommon problem in women. It can be caused by:  Infection.  A side effect of a medicine.  A problem in a nearby organ that presses or squeezes on the bladder or the urethra (the tube that drains the bladder).  Psychological problems.   Surgery on your bladder, urethra, or pelvic organs that causes obstruction to the outflow of urine from your bladder. HOME CARE INSTRUCTIONS  If you are sent home with a Foley catheter and a drainage system, you will need to discuss the best course of action with your health care provider. While the catheter is in, maintain a good intake of fluids. Keep the drainage bag emptied and lower than your catheter. This is so that contaminated urine will not flow back into your bladder, which could lead to a urinary tract infection. There are two main types of drainage bags. One is a large bag that usually is used at night. It has a good capacity that will allow you to sleep through the night without having to empty it. The second type is called a leg bag. It has a smaller capacity so it needs to be emptied more frequently. However, the main advantage is that it can be attached by a leg strap and goes underneath your clothing, allowing you the freedom to move about or leave your home. Only take over-the-counter or prescription medicines for pain, discomfort, or fever as directed by your health care provider.  SEEK MEDICAL CARE IF:  You develop a low-grade fever.  You experience spasms or leakage of urine with the spasms. SEEK IMMEDIATE MEDICAL CARE IF:   You develop chills or fever.  Your catheter stops draining urine.  Your catheter falls out.  You start to develop increased bleeding that does not respond to rest and increased fluid intake. MAKE SURE YOU:  Understand these instructions.  Will watch your condition.  Will get help right away if you are not  doing well or get worse. Document Released: 09/28/2006 Document Revised: 07/20/2013 Document Reviewed: 03/10/2013 [facility] Patient Information 2015 [place], [place]. This information is not intended to replace advice given to you by your health care provider. Make sure you discuss any questions you have with your health care provider.  Urinary Tract Infection Urinary tract infections (UTIs) can develop anywhere along your urinary tract. Your urinary tract is your body's drainage system for removing wastes and extra water. Your urinary tract includes two kidneys, two ureters, a bladder, and a urethra. Your kidneys are a pair of bean-shaped organs. Each kidney is about the size of your fist. They are located below your ribs, one on each side of your spine. CAUSES Infections are caused by microbes, which are microscopic organisms, including fungi, viruses, and bacteria. These organisms are so small that they can only be seen through a microscope. Bacteria are the microbes that most commonly cause UTIs. SYMPTOMS  Symptoms of UTIs may vary by age and gender of the patient and by the location of the infection. Symptoms in young women typically include a frequent and intense urge to urinate and a painful, burning feeling in the bladder or urethra during urination. Older women and men are more likely to be tired, shaky, and weak and have muscle aches and abdominal pain. A fever may mean the infection is in your kidneys. Other symptoms of a kidney infection include pain in your back or sides below the ribs, nausea, and vomiting.  DIAGNOSIS To diagnose a UTI, your caregiver will ask you about your symptoms. Your caregiver also will ask to provide a urine sample. The urine sample will be tested for bacteria and white blood cells. White blood cells are made by your body to help fight infection. TREATMENT  Typically, UTIs can be treated with medication. Because most UTIs are caused by a bacterial infection, they  usually can be treated with the use of antibiotics. The choice of antibiotic and length of treatment depend on your symptoms and the type of bacteria causing your infection. HOME CARE INSTRUCTIONS  If you were prescribed antibiotics, take them exactly as your caregiver instructs you. Finish the medication even if you feel better after you have only taken some of the medication.  Drink enough water and fluids to keep your urine clear or pale yellow.  Avoid caffeine, tea, and carbonated beverages. They tend to irritate your bladder.  Empty your bladder often. Avoid holding urine for long periods of time.  Empty your bladder before and after sexual intercourse.  After a bowel movement, women should cleanse from front to back. Use each tissue only once. SEEK MEDICAL CARE IF:   You have back pain.  You develop a fever.  Your symptoms do not begin to resolve within 3 days. SEEK IMMEDIATE MEDICAL CARE IF:   You have severe back pain or lower abdominal pain.  You develop chills.  You have nausea or vomiting.  You have continued burning or discomfort with urination. MAKE SURE YOU:   Understand these instructions.  Will watch your condition.  Will get help right away if you are not doing well or get worse. Document Released: 07/09/2005 Document Revised: 03/30/2012 Document Reviewed: 11/07/2011 [facility] Patient Information 2015 [place], [place]. This information is not intended to replace advice given to you by your health care provider. Make sure you discuss any questions you have with your health care provider.

## 2015-01-11 NOTE — ED Notes (Signed)
Pt came in via car with spouse complaining of urinary retention since 3am this morning. Intermittent lower abdominal pain.

## 2015-01-11 NOTE — ED Provider Notes (Signed)
Patient with sensation of urinary retention onset today 12 noon.. No other complaint. No focal numbness or weakness. No loss of bowel control. Normal bowel movement earlier today. On exam alert no distress Glasgow Coma Score 15 motor strength 5 over 5 overall DTRs symmetric bilaterally at knee jerk ankle jerk and biceps toes downward going bilaterally  Orlie Dakin, MD 01/12/15 0106

## 2015-01-11 NOTE — ED Notes (Signed)
Patient transported to MRI 

## 2015-01-11 NOTE — ED Provider Notes (Signed)
CSN: [id number]     Arrival date & time 01/11/15  1727 History   First MD Initiated Contact with Patient 01/11/15 1842     Chief Complaint  Patient presents with  . Urinary Retention     (Consider location/radiation/quality/duration/timing/severity/associated sxs/prior Treatment) HPI   Angela Gross Is a 64 year old female with a past medical history of RA, breast cancer who presents emergency Department with chief complaint of acute urinary retention. Patient states that around 12 PM today she started feeling urinary urgency but was unable to urinate. Pressure and pain became worse and worse until she sought care at the emergency department. She denies any other symptoms such as hematuria, dysuria, foul odor of urine. Did not have a history of previous frequent urinary tract infections. The patient has had some constipation and states that every time she felt like she needed to urinate today. She did defecate. He states at first, the stool was hard, however, became loose later. Denies fevers, chills, myalgias, arthralgias. Denies DOE, SOB, chest tightness or pressure, radiation to left arm, jaw or back, or diaphoresis. Denies headaches, light headedness, weakness, visual disturbances. Denies abdominal pain, nausea, vomiting, diarrhea or constipation.    Past Medical History  Diagnosis Date  . Raynaud disease   . Osteoporosis   . Hypertension   . PONV (postoperative nausea and vomiting)   . Wears glasses   . Full dentures   . Fibromyalgia   . Rheumatoid arthritis     "elbows; fingers; toes"  . Osteoarthritis of both knees   . Degenerative disc disease, cervical   . Chronic left shoulder pain     "work related injury" (01/10/2014)  . Breast cancer     "right" (01/10/2014)  . Type II diabetes mellitus     has not taken meds in over a month (01/10/2014)  . Syncope and collapse     "4 times in the last year" (01/10/2014)   Past Surgical History  Procedure Laterality Date  .  Colonoscopy    . Mastectomy w/ sentinel node biopsy Right 12/13/2013    Procedure: RIGHT TOTAL MASTECTOMY WITH SENTINEL LYMPH NODE BIOPSY;  Surgeon: Adin Hector, MD;  Location: [place];  Service: General;  Laterality: Right;  . Portacath placement Right 12/13/2013    Procedure: INSERTION PORT-A-CATH;  Surgeon: Adin Hector, MD;  Location: [place];  Service: General;  Laterality: Right;  . Breast reconstruction with placement of tissue expander and flex hd (acellular hydrated dermis) Right 12/13/2013    Procedure: RIGHT BREAST RECONSTRUCTION WITH PLACEMENT OF TISSUE EXPANDER AND FLEX HD (ACELLULAR HYDRATED DERMIS);  Surgeon: Irene Limbo, MD;  Location: [place];  Service: Plastics;  Laterality: Right;  . Lipoma excision Bilateral 12/13/2013    Procedure: EXCISION OF LEFT BREAST KELOID AND RIGHT CHEST KELOID;  Surgeon: Irene Limbo, MD;  Location: [place];  Service: Plastics;  Laterality: Bilateral;  . Mastectomy    . Cholecystectomy  1970's  . Vaginal hysterectomy  1980    no salpingo-oophoretomu  . Breast biopsy Right 10/2013  . Tissue expander placement Right 01/10/2014    Procedure: Incision and Drainage Right Breast Seroma with TISSUE EXPANDER exchange;  Surgeon: Irene Limbo, MD;  Location: [place];  Service: Plastics;  Laterality: Right;  . Removal of bilateral tissue expanders with placement of bilateral breast implants Bilateral 09/26/2014    Procedure: REMOVAL OF RIGHT TISSUE EXPANDER WITH PLACEMENT OF RIGHT BREAST IMPLANT LIPOFILLING TO RIGHT BREAST/LEFT BREAST  MASTOPEXY FOR SYMMETRY;  Surgeon: Irene Limbo, MD;  Location: [place];  Service: Plastics;  Laterality: Bilateral;  . Liposuction with lipofilling Right 09/26/2014    Procedure: LIPOSUCTION WITH LIPOFILLING;  Surgeon: Irene Limbo, MD;  Location: [place];  Service: Plastics;  Laterality: Right;  . Mastopexy  Left 09/26/2014    Procedure: MASTOPEXY;  Surgeon: Irene Limbo, MD;  Location: [place];  Service: Plastics;  Laterality: Left;  . Breast reconstruction Right 12/12/2014    Procedure: REVISION OF RIGHT RECONSTRUCTIVE BREAST WITH CAPSULORRHAPHY PLACEMENT OF SILCONE IMPLANTS AND LIPOFILLING TO RIGHT CHEST ;  Surgeon: Irene Limbo, MD;  Location: [place];  Service: Plastics;  Laterality: Right;  . Liposuction with lipofilling Right 12/12/2014    Procedure: LIPOSUCTION WITH LIPOFILLING;  Surgeon: Irene Limbo, MD;  Location: [place];  Service: Plastics;  Laterality: Right;   No family history on file. History  Substance Use Topics  . Smoking status: Never Smoker   . Smokeless tobacco: Never Used  . Alcohol Use: No   OB History    No data available     Review of Systems  Ten systems reviewed and are negative for acute change, except as noted in the HPI.    Allergies  Codeine and Diazepam  Home Medications   Prior to Admission medications   Medication Sig Start Date End Date Taking? Authorizing Provider  anastrozole (ARIMIDEX) 1 MG tablet Take 1 tablet (1 mg total) by mouth daily. 12/05/14  Yes Laurie Panda, NP  carvedilol (COREG) 12.5 MG tablet Take 1 tablet (12.5 mg total) by mouth 2 (two) times daily with a meal. 08/24/14  Yes Larey Dresser, MD  cyclobenzaprine (FLEXERIL) 10 MG tablet Take 1 tablet (10 mg total) by mouth 3 (three) times daily as needed for muscle spasms. 12/05/14  Yes Laurie Panda, NP  gabapentin (NEURONTIN) 300 MG capsule Take 1 capsule (300 mg total) by mouth 3 (three) times daily. For neuropathy and hot flashes 12/29/14  Yes Chauncey Cruel, MD  lidocaine-prilocaine (EMLA) cream Apply 1 application topically as needed. 1-2 hrs before port access 01/11/14  Yes Amy G Berry, PA-C  lisinopril (PRINIVIL,ZESTRIL) 10 MG tablet Take 1 tablet (10 mg total) by mouth daily. 06/08/14  Yes Jolaine Artist,  MD  oxyCODONE (OXY IR/ROXICODONE) 5 MG immediate release tablet Take 1 tablet (5 mg total) by mouth every 4 (four) hours as needed for severe pain. 12/12/14  Yes Irene Limbo, MD  traMADol (ULTRAM) 50 MG tablet Take 1 tablet (50 mg total) by mouth 3 (three) times daily as needed (pain). 08/01/14  Yes Laurie Panda, NP  sulfamethoxazole-trimethoprim (BACTRIM DS,SEPTRA DS) 800-160 MG per tablet Take 1 tablet by mouth 2 (two) times daily. Patient not taking: Reported on 01/11/2015 12/12/14   Irene Limbo, MD   BP 135/88 mmHg  Pulse 95  Temp(Src) 98.3 F (36.8 C) (Oral)  Resp 20  SpO2 100% Physical Exam  Constitutional: She is oriented to person, place, and time. She appears well-developed and well-nourished. No distress.  HENT:  Head: Normocephalic and atraumatic.  Eyes: Conjunctivae are normal. No scleral icterus.  Neck: Normal range of motion.  Cardiovascular: Normal rate, regular rhythm and normal heart sounds.  Exam reveals no gallop and no friction rub.   No murmur heard. Pulmonary/Chest: Effort normal and breath sounds normal. No respiratory distress.  Abdominal: Soft. Bowel sounds are normal. She exhibits no distension and no mass. There  is no tenderness. There is no guarding.  Neurological: She is alert and oriented to person, place, and time.  Skin: Skin is warm and dry. She is not diaphoretic.  Nursing note and vitals reviewed.   ED Course  Procedures (including critical care time) Labs Review Labs Reviewed  URINALYSIS, ROUTINE W REFLEX MICROSCOPIC - Abnormal; Notable for the following:    APPearance CLOUDY (*)    Hgb urine dipstick SMALL (*)    Leukocytes, UA LARGE (*)    All other components within normal limits  URINE MICROSCOPIC-ADD ON - Abnormal; Notable for the following:    Bacteria, UA FEW (*)    All other components within normal limits    Imaging Review No results found.   EKG Interpretation None      MDM   Final diagnoses:  None   BP  135/88 mmHg  Pulse 95  Temp(Src) 98.3 F (36.8 C) (Oral)  Resp 20  SpO2 100%  Patient with acute urinary retention. Foley catheter in place. Greater than 750 ML's of urine drained. She appears to have a urinary tract infection. Patient will be given IM Rocephin.   10:26 PM BP 135/88 mmHg  Pulse 95  Temp(Src) 98.3 F (36.8 C) (Oral)  Resp 20  SpO2 100% Negative MR lumbar spine for cauda equina or metatstatic disease of the lumbar spine. Discharged with Keflex, urine sent for culture.  Foley in place. Follow up with [facility].  Margarita Mail, PA-C 01/12/15 1855  Orlie Dakin, MD 01/13/15 1500

## 2015-01-11 NOTE — ED Notes (Addendum)
Bladder scan >600cc in bladder.

## 2015-01-13 LAB — URINE CULTURE: Colony Count: >=100000

## 2015-01-15 ENCOUNTER — Telehealth (HOSPITAL_COMMUNITY): Payer: Self-pay

## 2015-01-15 ENCOUNTER — Emergency Department (HOSPITAL_COMMUNITY)
Admission: EM | Admit: 2015-01-15 | Discharge: 2015-01-15 | Disposition: A | Discharge status: Home / Self Care | Payer: Medicare HMO | Attending: Emergency Medicine | Admitting: Emergency Medicine

## 2015-01-15 ENCOUNTER — Encounter (HOSPITAL_COMMUNITY): Payer: Self-pay | Admitting: Emergency Medicine

## 2015-01-15 DIAGNOSIS — E119 Type 2 diabetes mellitus without complications: Secondary | ICD-10-CM | POA: Diagnosis not present

## 2015-01-15 DIAGNOSIS — I10 Essential (primary) hypertension: Secondary | ICD-10-CM | POA: Diagnosis not present

## 2015-01-15 DIAGNOSIS — Z87898 Personal history of other specified conditions: Secondary | ICD-10-CM

## 2015-01-15 DIAGNOSIS — Z79899 Other long term (current) drug therapy: Secondary | ICD-10-CM | POA: Insufficient documentation

## 2015-01-15 DIAGNOSIS — Z792 Long term (current) use of antibiotics: Secondary | ICD-10-CM | POA: Diagnosis not present

## 2015-01-15 DIAGNOSIS — G8929 Other chronic pain: Secondary | ICD-10-CM | POA: Insufficient documentation

## 2015-01-15 DIAGNOSIS — Z4689 Encounter for fitting and adjustment of other specified devices: Secondary | ICD-10-CM | POA: Insufficient documentation

## 2015-01-15 DIAGNOSIS — Z853 Personal history of malignant neoplasm of breast: Secondary | ICD-10-CM | POA: Insufficient documentation

## 2015-01-15 DIAGNOSIS — M797 Fibromyalgia: Secondary | ICD-10-CM | POA: Diagnosis not present

## 2015-01-15 NOTE — Discharge Instructions (Signed)
Acute Urinary Retention °Acute urinary retention is the temporary inability to urinate. This is an uncommon problem in women. It can be caused by: °· Infection. °· A side effect of a medicine. °· A problem in a nearby organ that presses or squeezes on the bladder or the urethra (the tube that drains the bladder). °· Psychological problems. °·  Surgery on your bladder, urethra, or pelvic organs that causes obstruction to the outflow of urine from your bladder. °HOME CARE INSTRUCTIONS  °If you are sent home with a Foley catheter and a drainage system, you will need to discuss the best course of action with your health care provider. While the catheter is in, maintain a good intake of fluids. Keep the drainage bag emptied and lower than your catheter. This is so that contaminated urine will not flow back into your bladder, which could lead to a urinary tract infection. °There are two main types of drainage bags. One is a large bag that usually is used at night. It has a good capacity that will allow you to sleep through the night without having to empty it. The second type is called a leg bag. It has a smaller capacity so it needs to be emptied more frequently. However, the main advantage is that it can be attached by a leg strap and goes underneath your clothing, allowing you the freedom to move about or leave your home. °Only take over-the-counter or prescription medicines for pain, discomfort, or fever as directed by your health care provider.  °SEEK MEDICAL CARE IF: °· You develop a low-grade fever. °· You experience spasms or leakage of urine with the spasms. °SEEK IMMEDIATE MEDICAL CARE IF:  °· You develop chills or fever. °· Your catheter stops draining urine. °· Your catheter falls out. °· You start to develop increased bleeding that does not respond to rest and increased fluid intake. °MAKE SURE YOU: °· Understand these instructions. °· Will watch your condition. °· Will get help right away if you are not  doing well or get worse. °Document Released: 09/28/2006 Document Revised: 07/20/2013 Document Reviewed: 03/10/2013 °[employer]® Patient Information ©2015 [employer]. This information is not intended to replace advice given to you by your health care provider. Make sure you discuss any questions you have with your health care provider. ° °

## 2015-01-15 NOTE — ED Notes (Signed)
Pt was seen in ED on Thursday for urinary retention, had catheter placed. Pt was told by urology office to come to ED for catheter removal. Pt denies pain and has no symptoms at this time. Pt alert and oriented x4.

## 2015-01-15 NOTE — ED Provider Notes (Signed)
CSN: [id number]     Arrival date & time 01/15/15  1107 History   First MD Initiated Contact with Patient 01/15/15 1204     Chief Complaint  Patient presents with  . catheter removal      (Consider location/radiation/quality/duration/timing/severity/associated sxs/prior Treatment) HPI Comments: Patient seen 4 days ago for urinary retention, UTI dx, had negative MRI. Has been feeling well, has no acute complaints would like foley removed.    Past Medical History  Diagnosis Date  . Raynaud disease   . Osteoporosis   . Hypertension   . PONV (postoperative nausea and vomiting)   . Wears glasses   . Full dentures   . Fibromyalgia   . Rheumatoid arthritis     "elbows; fingers; toes"  . Osteoarthritis of both knees   . Degenerative disc disease, cervical   . Chronic left shoulder pain     "work related injury" (01/10/2014)  . Breast cancer     "right" (01/10/2014)  . Type II diabetes mellitus     has not taken meds in over a month (01/10/2014)  . Syncope and collapse     "4 times in the last year" (01/10/2014)   Past Surgical History  Procedure Laterality Date  . Colonoscopy    . Mastectomy w/ sentinel node biopsy Right 12/13/2013    Procedure: RIGHT TOTAL MASTECTOMY WITH SENTINEL LYMPH NODE BIOPSY;  Surgeon: Adin Hector, MD;  Location: [place];  Service: General;  Laterality: Right;  . Portacath placement Right 12/13/2013    Procedure: INSERTION PORT-A-CATH;  Surgeon: Adin Hector, MD;  Location: [place];  Service: General;  Laterality: Right;  . Breast reconstruction with placement of tissue expander and flex hd (acellular hydrated dermis) Right 12/13/2013    Procedure: RIGHT BREAST RECONSTRUCTION WITH PLACEMENT OF TISSUE EXPANDER AND FLEX HD (ACELLULAR HYDRATED DERMIS);  Surgeon: Irene Limbo, MD;  Location: [place];  Service: Plastics;  Laterality: Right;  . Lipoma excision Bilateral 12/13/2013    Procedure: EXCISION OF  LEFT BREAST KELOID AND RIGHT CHEST KELOID;  Surgeon: Irene Limbo, MD;  Location: [place];  Service: Plastics;  Laterality: Bilateral;  . Mastectomy    . Cholecystectomy  1970's  . Vaginal hysterectomy  1980    no salpingo-oophoretomu  . Breast biopsy Right 10/2013  . Tissue expander placement Right 01/10/2014    Procedure: Incision and Drainage Right Breast Seroma with TISSUE EXPANDER exchange;  Surgeon: Irene Limbo, MD;  Location: [place];  Service: Plastics;  Laterality: Right;  . Removal of bilateral tissue expanders with placement of bilateral breast implants Bilateral 09/26/2014    Procedure: REMOVAL OF RIGHT TISSUE EXPANDER WITH PLACEMENT OF RIGHT BREAST IMPLANT LIPOFILLING TO RIGHT BREAST/LEFT BREAST MASTOPEXY FOR SYMMETRY;  Surgeon: Irene Limbo, MD;  Location: [place];  Service: Plastics;  Laterality: Bilateral;  . Liposuction with lipofilling Right 09/26/2014    Procedure: LIPOSUCTION WITH LIPOFILLING;  Surgeon: Irene Limbo, MD;  Location: [place];  Service: Plastics;  Laterality: Right;  . Mastopexy Left 09/26/2014    Procedure: MASTOPEXY;  Surgeon: Irene Limbo, MD;  Location: [place];  Service: Plastics;  Laterality: Left;  . Breast reconstruction Right 12/12/2014    Procedure: REVISION OF RIGHT RECONSTRUCTIVE BREAST WITH CAPSULORRHAPHY PLACEMENT OF SILCONE IMPLANTS AND LIPOFILLING TO RIGHT CHEST ;  Surgeon: Irene Limbo, MD;  Location: [place];  Service: Plastics;  Laterality: Right;  . Liposuction with lipofilling Right  12/12/2014    Procedure: LIPOSUCTION WITH LIPOFILLING;  Surgeon: Irene Limbo, MD;  Location: [place];  Service: Plastics;  Laterality: Right;   No family history on file. History  Substance Use Topics  . Smoking status: Never Smoker   . Smokeless tobacco: Never Used  . Alcohol Use: No   OB History    No data available      Review of Systems  Constitutional: Negative for fever, chills, diaphoresis, activity change, appetite change and fatigue.  HENT: Negative for congestion, facial swelling, rhinorrhea and sore throat.   Eyes: Negative for photophobia and discharge.  Respiratory: Negative for cough, chest tightness and shortness of breath.   Cardiovascular: Negative for chest pain, palpitations and leg swelling.  Gastrointestinal: Negative for nausea, vomiting, abdominal pain and diarrhea.  Endocrine: Negative for polydipsia and polyuria.  Genitourinary: Negative for dysuria, frequency, difficulty urinating and pelvic pain.  Musculoskeletal: Negative for back pain, arthralgias, neck pain and neck stiffness.  Skin: Negative for color change and wound.  Allergic/Immunologic: Negative for immunocompromised state.  Neurological: Negative for facial asymmetry, weakness, numbness and headaches.  Hematological: Does not bruise/bleed easily.  Psychiatric/Behavioral: Negative for confusion and agitation.      Allergies  Codeine and Diazepam  Home Medications   Prior to Admission medications   Medication Sig Start Date End Date Taking? Authorizing Provider  anastrozole (ARIMIDEX) 1 MG tablet Take 1 tablet (1 mg total) by mouth daily. 12/05/14  Yes Laurie Panda, NP  carvedilol (COREG) 12.5 MG tablet Take 1 tablet (12.5 mg total) by mouth 2 (two) times daily with a meal. 08/24/14  Yes Larey Dresser, MD  cephALEXin (KEFLEX) 500 MG capsule Take 1 capsule (500 mg total) by mouth 3 (three) times daily. 01/11/15  Yes Margarita Mail, PA-C  cyclobenzaprine (FLEXERIL) 10 MG tablet Take 1 tablet (10 mg total) by mouth 3 (three) times daily as needed for muscle spasms. 12/05/14  Yes Laurie Panda, NP  gabapentin (NEURONTIN) 300 MG capsule Take 1 capsule (300 mg total) by mouth 3 (three) times daily. For neuropathy and hot flashes 12/29/14  Yes Chauncey Cruel, MD  lidocaine-prilocaine (EMLA) cream Apply 1  application topically as needed. 1-2 hrs before port access 01/11/14  Yes Amy G Berry, PA-C  lisinopril (PRINIVIL,ZESTRIL) 10 MG tablet Take 1 tablet (10 mg total) by mouth daily. 06/08/14  Yes Jolaine Artist, MD  oxyCODONE (OXY IR/ROXICODONE) 5 MG immediate release tablet Take 1 tablet (5 mg total) by mouth every 4 (four) hours as needed for severe pain. Patient not taking: Reported on 01/16/2015 12/12/14  Yes Irene Limbo, MD  traMADol (ULTRAM) 50 MG tablet Take 1 tablet (50 mg total) by mouth 3 (three) times daily as needed (pain). 08/01/14  Yes Heather F Boelter, NP   BP 132/85 mmHg  Pulse 77  Temp(Src) 98.2 F (36.8 C) (Oral)  Resp 14  SpO2 98% Physical Exam  Constitutional: She is oriented to person, place, and time. She appears well-developed and well-nourished. No distress.  HENT:  Head: Normocephalic and atraumatic.  Mouth/Throat: No oropharyngeal exudate.  Eyes: Pupils are equal, round, and reactive to light.  Neck: Normal range of motion. Neck supple.  Cardiovascular: Normal rate, regular rhythm and normal heart sounds.  Exam reveals no gallop and no friction rub.   No murmur heard. Pulmonary/Chest: Effort normal and breath sounds normal. No respiratory distress. She has no wheezes. She has no rales.  Abdominal: Soft. Bowel sounds are normal. She exhibits  no distension and no mass. There is no tenderness. There is no rebound and no guarding.  Genitourinary:  Foley in place, draining  Musculoskeletal: Normal range of motion. She exhibits no edema or tenderness.  Neurological: She is alert and oriented to person, place, and time.  Skin: Skin is warm and dry.  Psychiatric: She has a normal mood and affect.    ED Course  Procedures (including critical care time) Labs Review Labs Reviewed - No data to display  Imaging Review No results found.   EKG Interpretation None      MDM   Final diagnoses:  H/O urinary retention   Pt presents requesting foley catheter  removal which was placed 5 days ago for urinary retention. Pt had otherwise nml neuro exam and nml MRI, she is being treated for UTI. Pt feeling well, no acute complaints. Foley removed, able to subsequently urinate w/o difficulty.   [name] evaluation in the Emergency Department is complete. It has been determined that no acute conditions requiring further emergency intervention are present at this time. The patient/guardian have been advised of the diagnosis and plan. We have discussed signs and symptoms that warrant return to the ED, such as changes or worsening in symptoms, fever, ab pain, return of retention.      Ernestina Patches, MD 01/17/15 [phone]

## 2015-01-15 NOTE — Telephone Encounter (Signed)
Post ED Visit - Positive Culture Follow-up  Culture report reviewed by antimicrobial stewardship pharmacist: []  Wes Fluvanna, Pharm.D., BCPS []  Heide Guile, Pharm.D., BCPS []  Alycia Rossetti, Pharm.D., BCPS []  [place], Pharm.D., BCPS, AAHIVP []  Legrand Como, Pharm.D., BCPS, AAHIVP []  Isac Sarna, Pharm.D., BCPS X  Sherlon Handing, Pharm. D.  Positive Urine culture, 100,000 colonies -> Group B Strep Treated with Cephalexin, organism sensitive to the same and no further patient follow-up is required at this time.  Dortha Kern 01/15/2015, 5:33 AM

## 2015-01-15 NOTE — ED Notes (Signed)
Pt walked to restroom, and reported she voided without difficulty. Dr Tawnya Crook made aware and will discharge pt home.

## 2015-01-16 ENCOUNTER — Ambulatory Visit (HOSPITAL_BASED_OUTPATIENT_CLINIC_OR_DEPARTMENT_OTHER): Payer: Medicare HMO | Admitting: Nurse Practitioner

## 2015-01-16 ENCOUNTER — Ambulatory Visit (HOSPITAL_BASED_OUTPATIENT_CLINIC_OR_DEPARTMENT_OTHER): Payer: Medicare HMO

## 2015-01-16 ENCOUNTER — Ambulatory Visit: Payer: Medicare HMO

## 2015-01-16 ENCOUNTER — Encounter: Payer: Self-pay | Admitting: Nurse Practitioner

## 2015-01-16 ENCOUNTER — Other Ambulatory Visit (HOSPITAL_BASED_OUTPATIENT_CLINIC_OR_DEPARTMENT_OTHER): Payer: Medicare HMO

## 2015-01-16 VITALS — BP 128/78 | HR 80 | Temp 98.2°F | Resp 18 | Ht 65.0 in | Wt 161.2 lb

## 2015-01-16 DIAGNOSIS — G620 Drug-induced polyneuropathy: Secondary | ICD-10-CM | POA: Diagnosis not present

## 2015-01-16 DIAGNOSIS — Z95828 Presence of other vascular implants and grafts: Secondary | ICD-10-CM

## 2015-01-16 DIAGNOSIS — C50911 Malignant neoplasm of unspecified site of right female breast: Secondary | ICD-10-CM

## 2015-01-16 DIAGNOSIS — C50311 Malignant neoplasm of lower-inner quadrant of right female breast: Secondary | ICD-10-CM

## 2015-01-16 DIAGNOSIS — K5900 Constipation, unspecified: Secondary | ICD-10-CM

## 2015-01-16 DIAGNOSIS — Z170 Estrogen receptor positive status [ER+]: Secondary | ICD-10-CM

## 2015-01-16 DIAGNOSIS — Z5112 Encounter for antineoplastic immunotherapy: Secondary | ICD-10-CM

## 2015-01-16 DIAGNOSIS — C50811 Malignant neoplasm of overlapping sites of right female breast: Secondary | ICD-10-CM | POA: Diagnosis not present

## 2015-01-16 LAB — CBC WITH DIFFERENTIAL/PLATELET
BASO%: 0.1 % (ref 0.0–2.0)
Basophils Absolute: 0 10*3/uL (ref 0.0–0.1)
EOS%: 1.7 % (ref 0.0–7.0)
Eosinophils Absolute: 0.2 10*3/uL (ref 0.0–0.5)
HEMATOCRIT: 38.3 % (ref 34.8–46.6)
HEMOGLOBIN: 12.3 g/dL (ref 11.6–15.9)
LYMPH%: 26.1 % (ref 14.0–49.7)
MCH: 26.5 pg (ref 25.1–34.0)
MCHC: 32.1 g/dL (ref 31.5–36.0)
MCV: 82.4 fL (ref 79.5–101.0)
MONO#: 0.6 10*3/uL (ref 0.1–0.9)
MONO%: 6.6 % (ref 0.0–14.0)
NEUT#: 5.8 10*3/uL (ref 1.5–6.5)
NEUT%: 65.5 % (ref 38.4–76.8)
PLATELETS: 192 10*3/uL (ref 145–400)
RBC: 4.65 10*6/uL (ref 3.70–5.45)
RDW: 14.1 % (ref 11.2–14.5)
WBC: 8.8 10*3/uL (ref 3.9–10.3)
lymph#: 2.3 10*3/uL (ref 0.9–3.3)

## 2015-01-16 LAB — COMPREHENSIVE METABOLIC PANEL (CC13)
ALBUMIN: 3.4 g/dL — AB (ref 3.5–5.0)
ALT: 9 U/L (ref 0–55)
ANION GAP: 9 meq/L (ref 3–11)
AST: 14 U/L (ref 5–34)
Alkaline Phosphatase: 120 U/L (ref 40–150)
BUN: 11.3 mg/dL (ref 7.0–26.0)
CO2: 24 meq/L (ref 22–29)
CREATININE: 1 mg/dL (ref 0.6–1.1)
Calcium: 8.8 mg/dL (ref 8.4–10.4)
Chloride: 108 mEq/L (ref 98–109)
EGFR: 68 mL/min/{1.73_m2} — AB (ref >90)
Glucose: 164 mg/dl — ABNORMAL HIGH (ref 70–140)
POTASSIUM: 3.7 meq/L (ref 3.5–5.1)
Sodium: 141 mEq/L (ref 136–145)
TOTAL PROTEIN: 7.2 g/dL (ref 6.4–8.3)
Total Bilirubin: 0.24 mg/dL (ref 0.20–1.20)

## 2015-01-16 MED ORDER — DIPHENHYDRAMINE HCL 25 MG PO CAPS
ORAL_CAPSULE | ORAL | Status: AC
  Filled 2015-01-16: qty 1

## 2015-01-16 MED ORDER — TRASTUZUMAB CHEMO INJECTION 440 MG
6.0000 mg/kg | Freq: Once | INTRAVENOUS | Status: AC
  Administered 2015-01-16: 420 mg via INTRAVENOUS
  Filled 2015-01-16: qty 20

## 2015-01-16 MED ORDER — DIPHENHYDRAMINE HCL 25 MG PO CAPS
25.0000 mg | ORAL_CAPSULE | Freq: Once | ORAL | Status: AC
  Administered 2015-01-16: 25 mg via ORAL

## 2015-01-16 MED ORDER — SODIUM CHLORIDE 0.9 % IV SOLN
Freq: Once | INTRAVENOUS | Status: AC
  Administered 2015-01-16: 13:00:00 via INTRAVENOUS

## 2015-01-16 MED ORDER — SODIUM CHLORIDE 0.9 % IJ SOLN
10.0000 mL | INTRAMUSCULAR | Status: DC | PRN
  Administered 2015-01-16: 10 mL via INTRAVENOUS
  Filled 2015-01-16: qty 10

## 2015-01-16 MED ORDER — SODIUM CHLORIDE 0.9 % IJ SOLN
10.0000 mL | INTRAMUSCULAR | Status: DC | PRN
  Administered 2015-01-16: 10 mL
  Filled 2015-01-16: qty 10

## 2015-01-16 MED ORDER — HEPARIN SOD (PORK) LOCK FLUSH 100 UNIT/ML IV SOLN
500.0000 [IU] | Freq: Once | INTRAVENOUS | Status: AC | PRN
  Administered 2015-01-16: 500 [IU]
  Filled 2015-01-16: qty 5

## 2015-01-16 MED ORDER — ACETAMINOPHEN 325 MG PO TABS
650.0000 mg | ORAL_TABLET | Freq: Once | ORAL | Status: AC
  Administered 2015-01-16: 650 mg via ORAL

## 2015-01-16 NOTE — Progress Notes (Signed)
[facility]  [phone] [phone]     ID: Angela Gross OB: December 24, 1950  MR#: [id number]  TUU#:[id number]  PCP: Salena Saner., MD GYN:   SUFanny Skates OTHER MD: [name], Lyndee Leo Sanger, Arnoldo Hooker [place]ma[place]  CHIEF COMPLAINT:  Right Breast Cancer, triple positive CURRENT TREATMENT:  Trastuzumab/, anastrozole  BREAST CANCER HISTORY: From the original intake note:  Angela Gross had routine screening mammography at the breast center 09/21/2013 showing a possible mass in calcifications in the right breast. On 10/11/2013 she underwent right diagnostic mammography and ultrasonography. This confirmed a spiculated 1.2 cm mass in the central right breast, with a group of pleomorphic calcifications slightly laterally. The calcifications spanning 1.6 cm. A second group of calcifications extended inferiorly and spanned 8 mm. The entire area in aggregate measured 6.5 cm. By physical exam there was no palpable abnormality. Ultrasonography showed an irregular hypoechoic mass at the 6:00 position of the right breast measuring 1 cm maximally. The right axilla was normal.  Biopsy of the right breast mass in question as well as the more lateral area of calcifications on 10/11/2013 showed ([id number]) most to be positive for an invasive ductal carcinoma, both estrogen receptor 85-90% positive with strong staining intensity, both progesterone receptor negative, with an MIB-1 of 17%. HER-2 was amplified, with a signals ratio of 3.44 and a copy number per cell 04 0.30.  Bilateral breast MRI 10/19/2013 showed again a spiculated mass in the 6:00 region of the right breast measuring 1.2 cm, a slightly more lateral hematoma associated with a second biopsy and also with clumped enhancement, and asymmetric none masslike enhancement in most of the right breast, the entire area of abnormality measuring 9.0 cm. The left breast was unremarkable and there were no abnormal appearing  lymph nodes.  Her subsequent history is as detailed below   INTERVAL HISTORY: Angela Gross returns for follow up of her breast cancer. She continues on anastrozole daily, and is due for trastuzumab as well today. She is tolerating both drugs well with few complaints. She has occasional hot flashes and some vaginal dryness. The interval history is remarkable for a trip to the ED on 3/31 for urinary retention. She was sent home with a foley, which was removed yesterday. She is voiding well now, and will be on a course of keflex for 7 days. The cause of the retention is unknown. An MRI of the spine demonstrated no spinal lesions.   REVIEW OF SYSTEMS: Angela Gross denies fevers, chills, nausea, or vomiting. She has had hard stools lately, and miralax is not helping. Her appetite and energy level are fair. She takes gabapentin TID for her hot flashes and residual neuropathy symptoms. She has a history of raynaud's phenomenon and rheumatoid arthritis, and typically has pain to her bilateral feet, but she has none today. Angela Gross is occasionally short of breath with exertion, but denies chest pain, palpitations, or cough. Her mood is stable. She has some depression, but denies anxiety. A detailed review of systems is otherwise negative.   PAST MEDICAL HISTORY: Past Medical History  Diagnosis Date  . Raynaud disease   . Osteoporosis   . Hypertension   . PONV (postoperative nausea and vomiting)   . Wears glasses   . Full dentures   . Fibromyalgia   . Rheumatoid arthritis     "elbows; fingers; toes"  . Osteoarthritis of both knees   . Degenerative disc disease, cervical   . Chronic left shoulder pain     "work related  injury" (01/10/2014)  . Breast cancer     "right" (01/10/2014)  . Type II diabetes mellitus     has not taken meds in over a month (01/10/2014)  . Syncope and collapse     "4 times in the last year" (01/10/2014)    PAST SURGICAL HISTORY: Past Surgical History  Procedure Laterality Date  .  Colonoscopy    . Mastectomy w/ sentinel node biopsy Right 12/13/2013    Procedure: RIGHT TOTAL MASTECTOMY WITH SENTINEL LYMPH NODE BIOPSY;  Surgeon: Adin Hector, MD;  Location: [place];  Service: General;  Laterality: Right;  . Portacath placement Right 12/13/2013    Procedure: INSERTION PORT-A-CATH;  Surgeon: Adin Hector, MD;  Location: [place];  Service: General;  Laterality: Right;  . Breast reconstruction with placement of tissue expander and flex hd (acellular hydrated dermis) Right 12/13/2013    Procedure: RIGHT BREAST RECONSTRUCTION WITH PLACEMENT OF TISSUE EXPANDER AND FLEX HD (ACELLULAR HYDRATED DERMIS);  Surgeon: Irene Limbo, MD;  Location: [place];  Service: Plastics;  Laterality: Right;  . Lipoma excision Bilateral 12/13/2013    Procedure: EXCISION OF LEFT BREAST KELOID AND RIGHT CHEST KELOID;  Surgeon: Irene Limbo, MD;  Location: [place];  Service: Plastics;  Laterality: Bilateral;  . Mastectomy    . Cholecystectomy  1970's  . Vaginal hysterectomy  1980    no salpingo-oophoretomu  . Breast biopsy Right 10/2013  . Tissue expander placement Right 01/10/2014    Procedure: Incision and Drainage Right Breast Seroma with TISSUE EXPANDER exchange;  Surgeon: Irene Limbo, MD;  Location: [place];  Service: Plastics;  Laterality: Right;  . Removal of bilateral tissue expanders with placement of bilateral breast implants Bilateral 09/26/2014    Procedure: REMOVAL OF RIGHT TISSUE EXPANDER WITH PLACEMENT OF RIGHT BREAST IMPLANT LIPOFILLING TO RIGHT BREAST/LEFT BREAST MASTOPEXY FOR SYMMETRY;  Surgeon: Irene Limbo, MD;  Location: [place];  Service: Plastics;  Laterality: Bilateral;  . Liposuction with lipofilling Right 09/26/2014    Procedure: LIPOSUCTION WITH LIPOFILLING;  Surgeon: Irene Limbo, MD;  Location: [place];  Service: Plastics;  Laterality: Right;  . Mastopexy  Left 09/26/2014    Procedure: MASTOPEXY;  Surgeon: Irene Limbo, MD;  Location: [place];  Service: Plastics;  Laterality: Left;  . Breast reconstruction Right 12/12/2014    Procedure: REVISION OF RIGHT RECONSTRUCTIVE BREAST WITH CAPSULORRHAPHY PLACEMENT OF SILCONE IMPLANTS AND LIPOFILLING TO RIGHT CHEST ;  Surgeon: Irene Limbo, MD;  Location: [place];  Service: Plastics;  Laterality: Right;  . Liposuction with lipofilling Right 12/12/2014    Procedure: LIPOSUCTION WITH LIPOFILLING;  Surgeon: Irene Limbo, MD;  Location: [place];  Service: Plastics;  Laterality: Right;    FAMILY HISTORY No family history on file. The patient is little information about her father. Her mother died from a heart attack at the age of 30. She had been diagnosed with breast cancer in her late 68s. The patient had 4 brothers and 4 sisters. There is no other history of breast or ovarian cancer in the family to her knowledge.  GYNECOLOGIC HISTORY:    (Reviewed 04/04/2014) She does not recall her age of menarche. She underwent hysterectomy in her 35s. She did not receive hormone replacement. First live birth at 81. She was GX P2.  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  She is currently disabled secondary to her rheumatoid arthritis. Her husband Melaine Mcphee is a retired Administrator. He now works part time at [employer]. Son [name] works in [place] as a Patent attorney. Daughter [name] is a Social worker.    ADVANCED DIRECTIVES: Not in place   HEALTH MAINTENANCE: (Updated 04/04/2014) History  Substance Use Topics  . Smoking status: Never Smoker   . Smokeless tobacco: Never Used  . Alcohol Use: No     Colonoscopy: Not on file  PAP: Status post remote hysterectomy  B[facility] density: [facility]05[facility] showed osteopenia with  a T score of -1.7  Lipid panel: Not on file   Allergies  Allergen Reactions  . Codeine Other (See Comments) and Hives    Altered mental status, numbness  Altered mental status, Numbess  . Diazepam     Other reaction(s): Delusions (intolerance) hallucinations     Current Outpatient Prescriptions  Medication Sig Dispense Refill  . anastrozole (ARIMIDEX) 1 MG tablet Take 1 tablet (1 mg total) by mouth daily. 30 tablet 12  . carvedilol (COREG) 12.5 MG tablet Take 1 tablet (12.5 mg total) by mouth 2 (two) times daily with a meal. 60 tablet 3  . cephALEXin (KEFLEX) 500 MG capsule Take 1 capsule (500 mg total) by mouth 3 (three) times daily. 30 capsule 0  . cyclobenzaprine (FLEXERIL) 10 MG tablet Take 1 tablet (10 mg total) by mouth 3 (three) times daily as needed for muscle spasms. 30 tablet 1  . gabapentin (NEURONTIN) 300 MG capsule Take 1 capsule (300 mg total) by mouth 3 (three) times daily. For neuropathy and hot flashes 270 capsule 1  . lidocaine-prilocaine (EMLA) cream Apply 1 application topically as needed. 1-2 hrs before port access 30 g 4  . lisinopril (PRINIVIL,ZESTRIL) 10 MG tablet Take 1 tablet (10 mg total) by mouth daily. 90 tablet 3  . traMADol (ULTRAM) 50 MG tablet Take 1 tablet (50 mg total) by mouth 3 (three) times daily as needed (pain). 90 tablet 1  . oxyCODONE (OXY IR/ROXICODONE) 5 MG immediate release tablet Take 1 tablet (5 mg total) by mouth every 4 (four) hours as needed for severe pain. (Patient not taking: Reported on 01/16/2015) 50 tablet 0   No current facility-administered medications for this visit.    OBJECTIVE:  Middle-aged African-American woman who appears stated age 17 Vitals:   01/16/15 1140  BP: 128/78  Pulse: 80  Temp: 98.2 F (36.8 C)  Resp: 18     Body mass index is 26.83 kg/(m^2).    ECOG FS: 1  Filed Weights   01/16/15 1140  Weight: 161 lb 3.2 oz (73.12 kg)   Skin: warm, dry  HEENT: sclerae anicteric, conjunctivae pink, oropharynx  clear. No thrush or mucositis.  Lymph Nodes: No cervical or supraclavicular lymphadenopathy  Lungs: clear to auscultation bilaterally, no rales, wheezes, or rhonci  Heart: regular rate and rhythm  Abdomen: round, soft, non tender, positive bowel sounds  Musculoskeletal: No focal spinal tenderness, no peripheral edema  Neuro: non focal, well oriented, positive affect  Breasts: deferred  LAB RESULTS:   Lab Results  Component Value Date   WBC 8.8 01/16/2015   NEUTROABS 5.8 01/16/2015   HGB 12.3 01/16/2015   HCT 38.3 01/16/2015   MCV 82.4 01/16/2015   PLT 192 01/16/2015      Chemistry      Component Value Date/Time   NA 141 01/16/2015 1104   NA 138 01/09/2014 1522  K 3.7 01/16/2015 1104   K 4.2 01/09/2014 1522   CL 100 01/09/2014 1522   CO2 24 01/16/2015 1104   CO2 25 01/09/2014 1522   BUN 11.3 01/16/2015 1104   BUN 11 01/09/2014 1522   CREATININE 1.0 01/16/2015 1104   CREATININE 0.99 01/09/2014 1522      Component Value Date/Time   CALCIUM 8.8 01/16/2015 1104   CALCIUM 8.5 01/09/2014 1522   ALKPHOS 120 01/16/2015 1104   ALKPHOS 102 12/09/2013 1400   AST 14 01/16/2015 1104   AST 36 12/09/2013 1400   ALT 9 01/16/2015 1104   ALT 30 12/09/2013 1400   BILITOT 0.24 01/16/2015 1104   BILITOT 0.2* 12/09/2013 1400     STUDIES: Most recent echocardiogram on 01/08/15 showed an improvement in ejection fraction to 55-60%  ASSESSMENT: 64 y.o. IXL woman   (1)  s/p biopsy of separate Right breast masses 10/11/2013 for a clinical mT1 N0, stage IA invasive ductal carcinoma, grade II-III, one of the mass is being 85% estrogen receptor positive, progesterone receptor negative, with an MIB-1 of 17% and HER-2 amplified.  (2) status post right mastectomy and sentinel lymph node sampling with immediate expander placement 12/13/2013 for a pT1c pN0, stage IA invasive ductal carcinoma, grade 3, again HER-2 positive.  (3) treated in the adjuvant setting with carboplatin, docetaxel,  trastuzumab and pertuzumab, first dose on 02/21/2014   (a). Patient developed significant peripheral neuropathy after one cycle of chemotherapy, and both carboplatin and docetaxel were held beginning with cycle 2. Continued pertuzumab/ trastuzumab x 5 more cycles, completed 08/01/2014  (b)  single agent trastuzumab to be started 08/29/2014, to be continued every 3 weeks until May 2016  (d) most recent echocardiogram 07/10/2014 showed an ejection fraction in the 50-55% range.  (4) started on anastrozole on 04/04/2014 .   (5) Rheumatoid arthritis,  remicade and lyrica held until completion of trastuzumab. Controlled with oxycodone PRN  (6) chemotherapy-induced peripheral neuropathy  - being treated with gabapentin, 300 BID, and tramadol, 50 mg every 6 hours as needed.    (7) reconstruction surgery 12/12/14   PLAN: Jaidah looks and feels well today. The labs were reviewed in detail and were stable. She will proceed with trastuzumab today as planned and will continue the anastrozole daily as well. Her most recent echocardiogram performed last week showed a well preserved ejection fraction.   I have encouraged Angela Gross many times, including today,  to increase her water intake and to couple stool softeners with miralax to have soft formed stools regularly.   Angela Gross has 2 more trastuzumab doses to complete after today. She will have her last echocardiogram in May, accordingly. She will meet with Dr. Jana Hakim prior to her last treatment as a follow up visit. She understands and agrees with this plan. She knows the goal of treatment in her case is cure. She has been encouraged to call with any issues that might arise before her next visit here.   Laurie Panda, NP  01/16/2015 12:14 PM

## 2015-01-16 NOTE — Patient Instructions (Signed)
[facility] Discharge Instructions for Patients Receiving Chemotherapy  Today you received the following chemotherapy agents:  Herceptin  To help prevent nausea and vomiting after your treatment, we encourage you to take your nausea medication as prescribed.   If you develop nausea and vomiting that is not controlled by your nausea medication, call the clinic.   BELOW ARE SYMPTOMS THAT SHOULD BE REPORTED IMMEDIATELY:  *FEVER GREATER THAN 100.5 F  *CHILLS WITH OR WITHOUT FEVER  NAUSEA AND VOMITING THAT IS NOT CONTROLLED WITH YOUR NAUSEA MEDICATION  *UNUSUAL SHORTNESS OF BREATH  *UNUSUAL BRUISING OR BLEEDING  TENDERNESS IN MOUTH AND THROAT WITH OR WITHOUT PRESENCE OF ULCERS  *URINARY PROBLEMS  *BOWEL PROBLEMS  UNUSUAL RASH Items with * indicate a potential emergency and should be followed up as soon as possible.  Feel free to call the clinic you have any questions or concerns. The clinic phone number is [phone].  Please show the CHEMO ALERT CARD at check-in to the Emergency Department and triage nurse.   

## 2015-01-16 NOTE — Addendum Note (Signed)
Addended by: Marcelino Duster on: 01/16/2015 05:14 PM   Modules accepted: Orders

## 2015-01-16 NOTE — Patient Instructions (Signed)

## 2015-01-19 ENCOUNTER — Ambulatory Visit: Payer: Medicare HMO | Attending: Plastic Surgery | Admitting: Physical Therapy

## 2015-01-19 DIAGNOSIS — M25611 Stiffness of right shoulder, not elsewhere classified: Secondary | ICD-10-CM | POA: Diagnosis not present

## 2015-01-19 DIAGNOSIS — R5381 Other malaise: Secondary | ICD-10-CM | POA: Insufficient documentation

## 2015-01-19 DIAGNOSIS — I972 Postmastectomy lymphedema syndrome: Secondary | ICD-10-CM | POA: Diagnosis not present

## 2015-01-19 DIAGNOSIS — R1011 Right upper quadrant pain: Secondary | ICD-10-CM | POA: Insufficient documentation

## 2015-01-19 NOTE — Therapy (Signed)
[place], [place], [address] Phone: [phone]   Fax:  [phone]  Physical Therapy Evaluation  Patient Details  Name: Angela Gross MRN: [id number] Date of Birth: Apr 02, 1951 Referring Provider:  Irene Limbo, MD  Encounter Date: 01/19/2015      PT End of Session - 01/19/15 1039    Visit Number 1   Number of Visits 17   Date for PT Re-Evaluation 03/21/15   PT Start Time 1030   PT Stop Time 1105   PT Time Calculation (min) 35 min   Activity Tolerance Patient tolerated treatment well   Behavior During Therapy [facility] for tasks assessed/performed      Past Medical History  Diagnosis Date  . Raynaud disease   . Osteoporosis   . Hypertension   . PONV (postoperative nausea and vomiting)   . Wears glasses   . Full dentures   . Fibromyalgia   . Rheumatoid arthritis     "elbows; fingers; toes"  . Osteoarthritis of both knees   . Degenerative disc disease, cervical   . Chronic left shoulder pain     "work related injury" (01/10/2014)  . Breast cancer     "right" (01/10/2014)  . Type II diabetes mellitus     has not taken meds in over a month (01/10/2014)  . Syncope and collapse     "4 times in the last year" (01/10/2014)    Past Surgical History  Procedure Laterality Date  . Colonoscopy    . Mastectomy w/ sentinel node biopsy Right 12/13/2013    Procedure: RIGHT TOTAL MASTECTOMY WITH SENTINEL LYMPH NODE BIOPSY;  Surgeon: Adin Hector, MD;  Location: [place];  Service: General;  Laterality: Right;  . Portacath placement Right 12/13/2013    Procedure: INSERTION PORT-A-CATH;  Surgeon: Adin Hector, MD;  Location: [place];  Service: General;  Laterality: Right;  . Breast reconstruction with placement of tissue expander and flex hd (acellular hydrated dermis) Right 12/13/2013    Procedure: RIGHT BREAST RECONSTRUCTION WITH PLACEMENT OF TISSUE EXPANDER AND FLEX  HD (ACELLULAR HYDRATED DERMIS);  Surgeon: Irene Limbo, MD;  Location: [place];  Service: Plastics;  Laterality: Right;  . Lipoma excision Bilateral 12/13/2013    Procedure: EXCISION OF LEFT BREAST KELOID AND RIGHT CHEST KELOID;  Surgeon: Irene Limbo, MD;  Location: [place];  Service: Plastics;  Laterality: Bilateral;  . Mastectomy    . Cholecystectomy  1970's  . Vaginal hysterectomy  1980    no salpingo-oophoretomu  . Breast biopsy Right 10/2013  . Tissue expander placement Right 01/10/2014    Procedure: Incision and Drainage Right Breast Seroma with TISSUE EXPANDER exchange;  Surgeon: Irene Limbo, MD;  Location: [place];  Service: Plastics;  Laterality: Right;  . Removal of bilateral tissue expanders with placement of bilateral breast implants Bilateral 09/26/2014    Procedure: REMOVAL OF RIGHT TISSUE EXPANDER WITH PLACEMENT OF RIGHT BREAST IMPLANT LIPOFILLING TO RIGHT BREAST/LEFT BREAST MASTOPEXY FOR SYMMETRY;  Surgeon: Irene Limbo, MD;  Location: [place];  Service: Plastics;  Laterality: Bilateral;  . Liposuction with lipofilling Right 09/26/2014    Procedure: LIPOSUCTION WITH LIPOFILLING;  Surgeon: Irene Limbo, MD;  Location: [place];  Service: Plastics;  Laterality: Right;  . Mastopexy Left 09/26/2014    Procedure: MASTOPEXY;  Surgeon: Irene Limbo, MD;  Location: [place];  Service: Plastics;  Laterality: Left;  . Breast reconstruction Right  12/12/2014    Procedure: REVISION OF RIGHT RECONSTRUCTIVE BREAST WITH CAPSULORRHAPHY PLACEMENT OF SILCONE IMPLANTS AND LIPOFILLING TO RIGHT CHEST ;  Surgeon: Irene Limbo, MD;  Location: [place];  Service: Plastics;  Laterality: Right;  . Liposuction with lipofilling Right 12/12/2014    Procedure: LIPOSUCTION WITH LIPOFILLING;  Surgeon: Irene Limbo, MD;  Location: [place];  Service: Plastics;   Laterality: Right;    There were no vitals filed for this visit.  Visit Diagnosis:  Lymphedema syndrome, postmastectomy  Right upper quadrant pain  Stiffness of joint, shoulder region, right  Physical deconditioning      Subjective Assessment - 01/19/15 1032    Subjective swelling where they took the lymph nodes out and pain with reaching and limited motion in her arm    Pertinent History breast cancer diagnosed Oct 12 2013, masatectomy with expander placement, implant placed that had to be remonved and then another put in December 12 2014 chemotherapy in 2015 that witll be completed 5/17/ 2016.  Also plans to have port removed and nipple places sometime in may    Patient Stated Goals to be able to limit swelling under her arm and increase her range of motion so that she doesn't get the shooting pain , wants to return to walking program She has just joined the [facility]            [facility] PT Assessment - 01/19/15 0001    Assessment   Medical Diagnosis breast cancer    Onset Date 10/13/13   Precautions   Precautions Other (comment)  cancer    Restrictions   Weight Bearing Restrictions No   Balance Screen   Has the patient fallen in the past 6 months Yes   How many times? 3  right after her surgery   Has the patient had a decrease in activity level because of a fear of falling?  No  pt states her daughter is concerned    Is the patient reluctant to leave their home because of a fear of falling?  Yes   [place] Private residence   Living Arrangements Spouse/significant other   Available Help at Discharge Available PRN/intermittently   Type of Home House   Prior Function   Level of Independence Independent with basic ADLs;Independent with homemaking with ambulation;Independent with gait;Independent with transfers  she takes her time: if she goes too fast she loses  breath   Vocation On disability   Leisure shopping with her daughter, read, music. uses  to love to walk    Cognition   Overall Cognitive Status Within Functional Limits for tasks assessed   Observation/Other Assessments   Observations well healed incisions  with some indentiatin at [place] above implant, but swelling in evident at posterior axilla    Sensation   Additional Comments tingling in her hands and feet that sometimes interferes with her balance    Posture/Postural Control   Posture/Postural Control No significant limitations   ROM / Strength   AROM / PROM / Strength AROM   AROM   Overall AROM  Deficits   Right Shoulder Extension 60 Degrees   Right Shoulder Flexion 136 Degrees  pain    Right Shoulder ABduction 124 Degrees  pulls    Right Shoulder Internal Rotation 60 Degrees   Right Shoulder External Rotation 80 Degrees   Left Shoulder Extension 55 Degrees   Left Shoulder Flexion 162 Degrees   Left Shoulder ABduction 145 Degrees  pull  Left Shoulder Internal Rotation 60 Degrees   Left Shoulder External Rotation 75 Degrees   Strength   Overall Strength Deficits;Due to pain   Overall Strength Comments overall 3-/5   Palpation   Palpation soft edema at anterior and posterion axilla lateral to implant           LYMPHEDEMA/ONCOLOGY QUESTIONNAIRE - 01/19/15 1052    What other symptoms do you have   Other Symptoms swelling increases with acivity.    Right Upper Extremity Lymphedema   10 cm Proximal to Olecranon Process 34.3 cm   Olecranon Process 27.5 cm   15 cm Proximal to Ulnar Styloid Process 26.5 cm   10 cm Proximal to Ulnar Styloid Process 23.4 cm   Just Proximal to Ulnar Styloid Process 16.5 cm   Across Hand at [employer] 19 cm   At [place] of 2nd Digit 5.8 cm   Left Upper Extremity Lymphedema   15 cm Proximal to Olecranon Process 33.5 cm   10 cm Proximal to Olecranon Process 33.4 cm   Olecranon Process 27.5 cm   15 cm Proximal to Ulnar Styloid Process 26.4 cm   10 cm Proximal to Ulnar Styloid Process 22.6 cm   Just Proximal to Ulnar  Styloid Process 16.1 cm   Across Hand at [employer] 18.5 cm   At [place] of 2nd Digit 5.6 cm           Quick Dash - 01/19/15 0001    Open a tight or new jar Moderate difficulty   Do heavy household chores (wash walls, wash floors) Moderate difficulty   Carry a shopping bag or briefcase Mild difficulty   Wash your back Moderate difficulty   Use a knife to cut food No difficulty   Recreational activities in which you take some force or impact through your arm, shoulder, or hand (golf, hammering, tennis) Severe difficulty   During the past week, to what extent has your arm, shoulder or hand problem interfered with your normal social activities with family, friends, neighbors, or groups? Slightly   During the past week, to what extent has your arm, shoulder or hand problem limited your work or other regular daily activities Slightly   Arm, shoulder, or hand pain. Moderate   Tingling (pins and needles) in your arm, shoulder, or hand Moderate   Difficulty Sleeping Mild difficulty   DASH Score 38.64 %                        Short Term Clinic Goals - 01/19/15 1230    CC Short Term Goal  #2   Title Patient will verbalize understanding of lymphedema risk reduction practices   Time 4   Period Weeks   Status New   CC Short Term Goal  #3   Title Patient will report a decrease in pain by 25% so they can perform daily activities with greater ease   Time 0   Period Weeks   Status New             Long Term Clinic Goals - 01/19/15 1230    CC Long Term Goal  #1   Title Patient will be independent in a advanced home program for range of motion and strength    Time 8   Period Weeks   Status New   CC Long Term Goal  #2   Title Patient will report a decrease in pain by 50% so they can perform daily  activities with greater ease   Time 8   Period Weeks   Status New   CC Long Term Goal  #3   Title Patient will decrease the DASH score to <25    to demonstrate increased  functional use of upper extremity   Baseline 38.64   CC Long Term Goal  #4   Title pt will report a decrase in axillay and lateral trunk edema by 50%   Time 8   Period Weeks   Status New            Plan - February 01, 2015 1039    Clinical Impression Statement pt presents with symptoms after more than a year of treatment for breast cancer She also reports recurrent falls and generalized fatigue   Pt will benefit from skilled therapeutic intervention in order to improve on the following deficits Decreased strength;Decreased knowledge of precautions;Pain;Increased edema;Decreased range of motion;Decreased activity tolerance;Impaired perceived functional ability;Decreased endurance   Rehab Potential Excellent   Clinical Impairments Affecting Rehab Potential RA, raynaud's disease, thinning bones and fibromyalia    PT Frequency 2x / week   PT Duration 6 weeks   PT Treatment/Interventions Therapeutic exercise;Balance training;Passive range of motion;DME Instruction;Patient/family education;Therapeutic activities;Manual lymph drainage;Manual techniques   PT Next Visit Plan Berg Balance Assessment. 6 minute walk test Manual lymph drainage to right upper quarter, back and upper arm, active range of motion to shoulders.Meeks decompression exercises   Consulted and Agree with Plan of Care Patient          G-Codes - 02-01-2015 1233    Functional Assessment Tool Used Quick DASH   Functional Limitation Carrying, moving and handling objects   Carrying, Moving and Handling Objects Current Status [phone]) At least 40 percent but less than 60 percent impaired, limited or restricted   Carrying, Moving and Handling Objects Goal Status ([id number]) At least 20 percent but less than 40 percent impaired, limited or restricted       Problem List Patient Active Problem List   Diagnosis Date Noted  . Constipation 10/04/2014  . Personal history of malignant neoplasm of breast 09/26/2014  . Arthralgia of multiple  sites 06/27/2014  . Chemotherapy induced cardiomyopathy 05/11/2014  . Hot flashes, menopausal 04/04/2014  . Chemotherapy-induced neuropathy 03/14/2014  . Anemia, unspecified 03/14/2014  . Hypokalemia 03/01/2014  . Anxiety 01/30/2014  . Breast cancer of lower-inner quadrant of right female breast 10/25/2013  . C-REACTIVE PROTEIN, SERUM, ELEVATED 03/07/2010  . DIABETES MELLITUS, TYPE II 03/04/2010  . ARTHRALGIA 03/04/2010   Donato Heinz. Owens Shark, PT February 01, 2015, 12:40 PM  [place], [place], [address] Phone: [phone]   Fax:  [phone]

## 2015-01-22 ENCOUNTER — Ambulatory Visit: Payer: Medicare HMO | Admitting: Physical Therapy

## 2015-01-22 DIAGNOSIS — I972 Postmastectomy lymphedema syndrome: Secondary | ICD-10-CM

## 2015-01-22 DIAGNOSIS — R5381 Other malaise: Secondary | ICD-10-CM

## 2015-01-22 DIAGNOSIS — R2689 Other abnormalities of gait and mobility: Secondary | ICD-10-CM

## 2015-01-23 NOTE — Therapy (Signed)
[place], [place], [address] Phone: [phone]   Fax:  [phone]  Physical Therapy Treatment  Patient Details  Name: Angela Gross MRN: [id number] Date of Birth: 1950-11-07 Referring Provider:  Willey Blade, MD  Encounter Date: 01/22/2015      PT End of Session - 01/22/15 1525    Visit Number 2   Number of Visits 17   Date for PT Re-Evaluation 03/21/15   PT Start Time 1430   PT Stop Time 1515   PT Time Calculation (min) 45 min   Activity Tolerance Patient tolerated treatment well   Behavior During Therapy [facility] for tasks assessed/performed      Past Medical History  Diagnosis Date  . Raynaud disease   . Osteoporosis   . Hypertension   . PONV (postoperative nausea and vomiting)   . Wears glasses   . Full dentures   . Fibromyalgia   . Rheumatoid arthritis     "elbows; fingers; toes"  . Osteoarthritis of both knees   . Degenerative disc disease, cervical   . Chronic left shoulder pain     "work related injury" (01/10/2014)  . Breast cancer     "right" (01/10/2014)  . Type II diabetes mellitus     has not taken meds in over a month (01/10/2014)  . Syncope and collapse     "4 times in the last year" (01/10/2014)    Past Surgical History  Procedure Laterality Date  . Colonoscopy    . Mastectomy w/ sentinel node biopsy Right 12/13/2013    Procedure: RIGHT TOTAL MASTECTOMY WITH SENTINEL LYMPH NODE BIOPSY;  Surgeon: Adin Hector, MD;  Location: [place];  Service: General;  Laterality: Right;  . Portacath placement Right 12/13/2013    Procedure: INSERTION PORT-A-CATH;  Surgeon: Adin Hector, MD;  Location: [place];  Service: General;  Laterality: Right;  . Breast reconstruction with placement of tissue expander and flex hd (acellular hydrated dermis) Right 12/13/2013    Procedure: RIGHT BREAST RECONSTRUCTION WITH PLACEMENT OF TISSUE EXPANDER AND FLEX  HD (ACELLULAR HYDRATED DERMIS);  Surgeon: Irene Limbo, MD;  Location: [place];  Service: Plastics;  Laterality: Right;  . Lipoma excision Bilateral 12/13/2013    Procedure: EXCISION OF LEFT BREAST KELOID AND RIGHT CHEST KELOID;  Surgeon: Irene Limbo, MD;  Location: [place];  Service: Plastics;  Laterality: Bilateral;  . Mastectomy    . Cholecystectomy  1970's  . Vaginal hysterectomy  1980    no salpingo-oophoretomu  . Breast biopsy Right 10/2013  . Tissue expander placement Right 01/10/2014    Procedure: Incision and Drainage Right Breast Seroma with TISSUE EXPANDER exchange;  Surgeon: Irene Limbo, MD;  Location: [place];  Service: Plastics;  Laterality: Right;  . Removal of bilateral tissue expanders with placement of bilateral breast implants Bilateral 09/26/2014    Procedure: REMOVAL OF RIGHT TISSUE EXPANDER WITH PLACEMENT OF RIGHT BREAST IMPLANT LIPOFILLING TO RIGHT BREAST/LEFT BREAST MASTOPEXY FOR SYMMETRY;  Surgeon: Irene Limbo, MD;  Location: [place];  Service: Plastics;  Laterality: Bilateral;  . Liposuction with lipofilling Right 09/26/2014    Procedure: LIPOSUCTION WITH LIPOFILLING;  Surgeon: Irene Limbo, MD;  Location: [place];  Service: Plastics;  Laterality: Right;  . Mastopexy Left 09/26/2014    Procedure: MASTOPEXY;  Surgeon: Irene Limbo, MD;  Location: [place];  Service: Plastics;  Laterality: Left;  . Breast reconstruction Right  12/12/2014    Procedure: REVISION OF RIGHT RECONSTRUCTIVE BREAST WITH CAPSULORRHAPHY PLACEMENT OF SILCONE IMPLANTS AND LIPOFILLING TO RIGHT CHEST ;  Surgeon: Irene Limbo, MD;  Location: [place];  Service: Plastics;  Laterality: Right;  . Liposuction with lipofilling Right 12/12/2014    Procedure: LIPOSUCTION WITH LIPOFILLING;  Surgeon: Irene Limbo, MD;  Location: [place];  Service: Plastics;   Laterality: Right;    There were no vitals filed for this visit.  Visit Diagnosis:  Lymphedema syndrome, postmastectomy  Physical deconditioning  Balance problem      Subjective Assessment - 01/22/15 1434    Subjective Started out a little stiff this morning, but doing fine.   Currently in Pain? No/denies  but stiffness from arthritis, Raynaud's fibromyalgia            [facility] PT Assessment - 01/23/15 0001    6 Minute Walk- Baseline   6 Minute Walk- Baseline yes  reports jittery legs after test; walked 840 feet   HR (bpm) 92  90 bpm +/- prior to test   02 Sat (%RA) 98 %  98% prior to test   Perceived Rate of Exertion (Borg) 13- Somewhat hard   Standardized Balance Assessment   Standardized Balance Assessment Berg Balance Test   Berg Balance Test   Sit to Stand Able to stand  independently using hands   Standing Unsupported Able to stand safely 2 minutes   Sitting with Back Unsupported but Feet Supported on Floor or Stool Able to sit safely and securely 2 minutes   Stand to Sit Controls descent by using hands   Transfers Able to transfer safely, minor use of hands   Standing Unsupported with Eyes Closed Able to stand 10 seconds safely   Standing Ubsupported with Feet Together Able to place feet together independently and stand 1 minute safely   From Standing, Reach Forward with Outstretched Arm Can reach confidently >25 cm (10")   From Standing Position, Pick up Object from Floor Able to pick up shoe safely and easily   From Standing Position, Turn to Look Behind Over each Shoulder Looks behind from both sides and weight shifts well   Turn 360 Degrees Able to turn 360 degrees safely but slowly   Standing Unsupported, Alternately Place Feet on Step/Stool Able to stand independently and safely and complete 8 steps in 20 seconds   Standing Unsupported, One Foot in Front Able to plae foot ahead of the other independently and hold 30 seconds   Standing on One Leg Able to lift  leg independently and hold equal to or more than 3 seconds   Total Score 49                   [facility] Adult PT Treatment/Exercise - 01/23/15 0001    Manual Therapy   Manual Therapy Manual Lymphatic Drainage (MLD)   Manual Lymphatic Drainage (MLD) In supine:  short neck, left axilla and anterior interaxillary anastomosis, right groin and axillo-inguinal anastomosis; in left sidelying, posterior interaxillary anastomosis right to left and right axillo-inguinal anastomosis.  Explained principles to patient as it was performed.                   Short Term Clinic Goals - 01/19/15 1230    CC Short Term Goal  #2   Title Patient will verbalize understanding of lymphedema risk reduction practices   Time 4   Period Weeks   Status New   CC Short Term  Goal  #3   Title Patient will report a decrease in pain by 25% so they can perform daily activities with greater ease   Time 0   Period Weeks   Status New             Long Term Clinic Goals - 01/19/15 1230    CC Long Term Goal  #1   Title Patient will be independent in a advanced home program for range of motion and strength    Time 8   Period Weeks   Status New   CC Long Term Goal  #2   Title Patient will report a decrease in pain by 50% so they can perform daily activities with greater ease   Time 8   Period Weeks   Status New   CC Long Term Goal  #3   Title Patient will decrease the DASH score to <25    to demonstrate increased functional use of upper extremity   Baseline 38.64   CC Long Term Goal  #4   Title pt will report a decrase in axillay and lateral trunk edema by 50%   Time 8   Period Weeks   Status New            Plan - 01/23/15 8101    Clinical Impression Statement Pt. with score of 49/56 on Berg balance test and with h/o falls; has not used a cane in past.  6 minute walk test fatigued her legs, but O2 and heart rate okay.  Began manual lymph drainage performance and instruction today.   Pt  will benefit from skilled therapeutic intervention in order to improve on the following deficits Decreased strength;Decreased knowledge of precautions;Pain;Increased edema;Decreased range of motion;Decreased activity tolerance;Impaired perceived functional ability;Decreased endurance   Rehab Potential Good   Clinical Impairments Affecting Rehab Potential RA, raynaud's disease, thinning bones and fibromyalia    PT Frequency 2x / week   PT Duration 6 weeks   PT Treatment/Interventions Balance training;Functional mobility training;Neuromuscular re-education;Manual lymph drainage;Patient/family education   PT Next Visit Plan Balance exercises; shoulder ROM; Meeks decompression.   Consulted and Agree with Plan of Care Patient        Problem List Patient Active Problem List   Diagnosis Date Noted  . Constipation 10/04/2014  . Personal history of malignant neoplasm of breast 09/26/2014  . Arthralgia of multiple sites 06/27/2014  . Chemotherapy induced cardiomyopathy 05/11/2014  . Hot flashes, menopausal 04/04/2014  . Chemotherapy-induced neuropathy 03/14/2014  . Anemia, unspecified 03/14/2014  . Hypokalemia 03/01/2014  . Anxiety 01/30/2014  . Breast cancer of lower-inner quadrant of right female breast 10/25/2013  . C-REACTIVE PROTEIN, SERUM, ELEVATED 03/07/2010  . DIABETES MELLITUS, TYPE II 03/04/2010  . ARTHRALGIA 03/04/2010    SALISBURY,DONNA 01/23/2015, 9:34 AM  [place], [place], [address] Phone: [phone]   Fax:  [phone]

## 2015-01-24 ENCOUNTER — Ambulatory Visit: Payer: Medicare HMO

## 2015-01-24 DIAGNOSIS — M25611 Stiffness of right shoulder, not elsewhere classified: Secondary | ICD-10-CM

## 2015-01-24 DIAGNOSIS — I972 Postmastectomy lymphedema syndrome: Secondary | ICD-10-CM

## 2015-01-24 DIAGNOSIS — R5381 Other malaise: Secondary | ICD-10-CM

## 2015-01-24 DIAGNOSIS — R1011 Right upper quadrant pain: Secondary | ICD-10-CM

## 2015-01-24 DIAGNOSIS — R2689 Other abnormalities of gait and mobility: Secondary | ICD-10-CM

## 2015-01-24 NOTE — Therapy (Signed)
[place], [place], [address] Phone: [phone]   Fax:  [phone]  Physical Therapy Treatment  Patient Details  Name: Angela Gross MRN: [id number] Date of Birth: Jul 30, 1951 Referring Provider:  Irene Limbo, MD  Encounter Date: 01/24/2015      PT End of Session - 01/24/15 1522    Visit Number 3   Number of Visits 17   Date for PT Re-Evaluation 03/21/15   PT Start Time 6962   PT Stop Time 1522   PT Time Calculation (min) 41 min      Past Medical History  Diagnosis Date  . Raynaud disease   . Osteoporosis   . Hypertension   . PONV (postoperative nausea and vomiting)   . Wears glasses   . Full dentures   . Fibromyalgia   . Rheumatoid arthritis     "elbows; fingers; toes"  . Osteoarthritis of both knees   . Degenerative disc disease, cervical   . Chronic left shoulder pain     "work related injury" (01/10/2014)  . Breast cancer     "right" (01/10/2014)  . Type II diabetes mellitus     has not taken meds in over a month (01/10/2014)  . Syncope and collapse     "4 times in the last year" (01/10/2014)    Past Surgical History  Procedure Laterality Date  . Colonoscopy    . Mastectomy w/ sentinel node biopsy Right 12/13/2013    Procedure: RIGHT TOTAL MASTECTOMY WITH SENTINEL LYMPH NODE BIOPSY;  Surgeon: Adin Hector, MD;  Location: [place];  Service: General;  Laterality: Right;  . Portacath placement Right 12/13/2013    Procedure: INSERTION PORT-A-CATH;  Surgeon: Adin Hector, MD;  Location: [place];  Service: General;  Laterality: Right;  . Breast reconstruction with placement of tissue expander and flex hd (acellular hydrated dermis) Right 12/13/2013    Procedure: RIGHT BREAST RECONSTRUCTION WITH PLACEMENT OF TISSUE EXPANDER AND FLEX HD (ACELLULAR HYDRATED DERMIS);  Surgeon: Irene Limbo, MD;  Location: [facility];  Service:  Plastics;  Laterality: Right;  . Lipoma excision Bilateral 12/13/2013    Procedure: EXCISION OF LEFT BREAST KELOID AND RIGHT CHEST KELOID;  Surgeon: Irene Limbo, MD;  Location: [facility];  Service: Plastics;  Laterality: Bilateral;  . Mastectomy    . Cholecystectomy  1970's  . Vaginal hysterectomy  1980    no salpingo-oophoretomu  . Breast biopsy Right 10/2013  . Tissue expander placement Right 01/10/2014    Procedure: Incision and Drainage Right Breast Seroma with TISSUE EXPANDER exchange;  Surgeon: Irene Limbo, MD;  Location: [place];  Service: Plastics;  Laterality: Right;  . Removal of bilateral tissue expanders with placement of bilateral breast implants Bilateral 09/26/2014    Procedure: REMOVAL OF RIGHT TISSUE EXPANDER WITH PLACEMENT OF RIGHT BREAST IMPLANT LIPOFILLING TO RIGHT BREAST/LEFT BREAST MASTOPEXY FOR SYMMETRY;  Surgeon: Irene Limbo, MD;  Location: [facility];  Service: Plastics;  Laterality: Bilateral;  . Liposuction with lipofilling Right 09/26/2014    Procedure: LIPOSUCTION WITH LIPOFILLING;  Surgeon: Irene Limbo, MD;  Location: [facility];  Service: Plastics;  Laterality: Right;  . Mastopexy Left 09/26/2014    Procedure: MASTOPEXY;  Surgeon: Irene Limbo, MD;  Location: [place];  Service: Plastics;  Laterality: Left;  . Breast reconstruction Right 12/12/2014    Procedure: REVISION OF RIGHT RECONSTRUCTIVE BREAST WITH CAPSULORRHAPHY PLACEMENT OF SILCONE IMPLANTS AND  LIPOFILLING TO RIGHT CHEST ;  Surgeon: Irene Limbo, MD;  Location: [place];  Service: Plastics;  Laterality: Right;  . Liposuction with lipofilling Right 12/12/2014    Procedure: LIPOSUCTION WITH LIPOFILLING;  Surgeon: Irene Limbo, MD;  Location: [facility] [facility];  Service: Plastics;  Laterality: Right;    There were no vitals filed for this visit.  Visit Diagnosis:  Lymphedema syndrome,  postmastectomy  Physical deconditioning  Balance problem  Right upper quadrant pain  Stiffness of joint, shoulder region, right      Subjective Assessment - 01/24/15 1444    Subjective Didnt sleep good last night so feel a little slow and sluggish today but Im okay.    Currently in Pain? No/denies                       [facility] Adult PT Treatment/Exercise - 01/24/15 0001    Knee/Hip Exercises: Aerobic   Stationary Bike Level 1 5 miuntes   Knee/Hip Exercises: Standing   Other Standing Knee Exercises Bil hip 3 way raises (marching, abduction and extension) all 10 reps each, pt returned good demo.   Other Standing Knee Exercises Supine Bridges 10 reps   Manual Therapy   Manual Lymphatic Drainage (MLD) In supine:  short neck, left axilla and anterior interaxillary anastomosis, right groin and axillo-inguinal anastomosis; in left sidelying, posterior interaxillary anastomosis right to left and right axillo-inguinal anastomosis.  Explained principles to patient as it was performed.                PT Education - 01/24/15 1504    Education provided Yes   Education Details LE strength and self manual lymph drainage   Person(s) Educated Patient   Methods Explanation;Demonstration;Handout   Comprehension Verbalized understanding;Returned demonstration;Need further instruction           Short Term Clinic Goals - 01/19/15 1230    CC Short Term Goal  #2   Title Patient will verbalize understanding of lymphedema risk reduction practices   Time 4   Period Weeks   Status New   CC Short Term Goal  #3   Title Patient will report a decrease in pain by 25% so they can perform daily activities with greater ease   Time 0   Period Weeks   Status New             Long Term Clinic Goals - 01/19/15 1230    CC Long Term Goal  #1   Title Patient will be independent in a advanced home program for range of motion and strength    Time 8   Period Weeks   Status New    CC Long Term Goal  #2   Title Patient will report a decrease in pain by 50% so they can perform daily activities with greater ease   Time 8   Period Weeks   Status New   CC Long Term Goal  #3   Title Patient will decrease the DASH score to <25    to demonstrate increased functional use of upper extremity   Baseline 38.64   CC Long Term Goal  #4   Title pt will report a decrase in axillay and lateral trunk edema by 50%   Time 8   Period Weeks   Status New            Plan - 01/24/15 1523    Clinical Impression Statement Began LE strengthening today with pt. She  tolerated this well reporting LE's only feeling a little fatigued after treatment today, not as bad as last visit. Also continued self manual lymph drainage instruction issuing handouts for both segments today.    Pt will benefit from skilled therapeutic intervention in order to improve on the following deficits Decreased strength;Decreased knowledge of precautions;Pain;Increased edema;Decreased range of motion;Decreased activity tolerance;Impaired perceived functional ability;Decreased endurance   Rehab Potential Good   Clinical Impairments Affecting Rehab Potential RA, raynaud's disease, thinning bones and fibromyalia    PT Frequency 2x / week   PT Duration 6 weeks   PT Treatment/Interventions Balance training;Functional mobility training;Neuromuscular re-education;Manual lymph drainage;Patient/family education   PT Next Visit Plan Balance exercises; shoulder ROM; Meeks decompression. Review HEP.    Consulted and Agree with Plan of Care Patient        Problem List Patient Active Problem List   Diagnosis Date Noted  . Constipation 10/04/2014  . Personal history of malignant neoplasm of breast 09/26/2014  . Arthralgia of multiple sites 06/27/2014  . Chemotherapy induced cardiomyopathy 05/11/2014  . Hot flashes, menopausal 04/04/2014  . Chemotherapy-induced neuropathy 03/14/2014  . Anemia, unspecified 03/14/2014  .  Hypokalemia 03/01/2014  . Anxiety 01/30/2014  . Breast cancer of lower-inner quadrant of right female breast 10/25/2013  . C-REACTIVE PROTEIN, SERUM, ELEVATED 03/07/2010  . DIABETES MELLITUS, TYPE II 03/04/2010  . ARTHRALGIA 03/04/2010    Otelia Limes, PTA 01/24/2015, 3:27 PM  [place], [place], [address] Phone: [phone]   Fax:  [phone]

## 2015-01-24 NOTE — Patient Instructions (Addendum)
Flexors, Supine Bridge   Lie supine, feet shoulder-width apart. Lift hips toward ceiling. Hold __3_ seconds. Repeat __10_ times per session. Do _2__ sessions per day.  Marching In-Place   Standing straight, alternate bringing knees toward trunk holding onto counter. Do _1-2__ sets of 8-10 repetitions. Do _1-2__ times per day.  Copyright  VHI. All rights reserved.  ABDUCTION: Standing (Active)   Stand, feet flat. Lift right leg out to side keeping chest in place, dont lean over. Complete 1-2___ sets of _8-10__ repetitions. Perform _1-2__ sessions per day.  http://gtsc.exer.us/111   Copyright  VHI. All rights reserved.  EXTENSION: Standing (Active)   Stand, both feet flat. Draw right leg behind body as far as possible keeping knee straight and chest up. Complete _1-2__ sets of 8-10___ repetitions. Perform _1-2__ sessions per day.  http://gtsc.exer.us/77   Copyright  VHI. All rights reserved.   Deep Effective Breath   Standing, sitting, or laying down, place both hands on the belly. Take a deep breath IN, expanding the belly; then breath OUT, contracting the belly. Repeat __5__ times. Do __2-3__ sessions per day and before your self massage.  http://gt2.exer.us/866   Copyright  VHI. All rights reserved.  Axilla to Axilla - Sweep   On uninvolved side make 5 circles in the armpit, then pump _5__ times from involved armpit across chest to uninvolved armpit, making a pathway. Do _1__ time per day.  Copyright  VHI. All rights reserved.  Axilla to Inguinal Nodes - Sweep   On involved side, make 5 circles at groin at panty line, then pump _5__ times from armpit along side of trunk to outer hip, making your other pathway. Do __1_ time per day.  Copyright  VHI. All rights reserved.  Arm Posterior: Elbow to Shoulder - Sweep   Pump _5__ times from back of elbow to top of shoulder. Then inner to outer upper arm _5_ times, then outer arm again _5_ times. Then back to  the pathways _2-3_ times. Do _1__ time per day.  Copyright  VHI. All rights reserved.  ARM: [place] Wrist to Elbow - Sweep   Pump or stationary circles _5__ times from wrist to elbow making sure to do both sides of the forearm. Then retrace your steps to the outer arm, and the pathways _2-3_ times each. Do _1__ time per day.  Copyright  VHI. All rights reserved.  ARM: Dorsum of Hand to Shoulder - Sweep   Pump or stationary circles _5__ times on back of hand including knuckle spaces and individual fingers if needed working up towards the wrist, then retrace all your steps working back up the forearm, doing both sides; upper outer arm and back to your pathways _2-3_ times each. Then do 5 circles again at uninvolved armpit and involved groin where you started! Good job!! Do __1_ time per day.  Copyright  VHI. All rights reserved.

## 2015-01-29 ENCOUNTER — Encounter: Payer: Self-pay | Admitting: Physical Therapy

## 2015-01-29 ENCOUNTER — Ambulatory Visit: Payer: Medicare HMO | Admitting: Physical Therapy

## 2015-01-29 ENCOUNTER — Other Ambulatory Visit: Payer: Self-pay | Admitting: General Surgery

## 2015-01-29 DIAGNOSIS — R2689 Other abnormalities of gait and mobility: Secondary | ICD-10-CM

## 2015-01-29 DIAGNOSIS — R5381 Other malaise: Secondary | ICD-10-CM

## 2015-01-29 DIAGNOSIS — I972 Postmastectomy lymphedema syndrome: Secondary | ICD-10-CM

## 2015-01-29 DIAGNOSIS — M25611 Stiffness of right shoulder, not elsewhere classified: Secondary | ICD-10-CM

## 2015-01-29 DIAGNOSIS — D0591 Unspecified type of carcinoma in situ of right breast: Secondary | ICD-10-CM

## 2015-01-29 DIAGNOSIS — R1011 Right upper quadrant pain: Secondary | ICD-10-CM

## 2015-01-29 NOTE — Therapy (Signed)
[place], [place], [address] Phone: [phone]   Fax:  [phone]  Physical Therapy Treatment  Patient Details  Name: Angela Gross MRN: [id number] Date of Birth: November 22, 1950 Referring Provider:  Willey Blade, MD  Encounter Date: 01/29/2015      PT End of Session - 01/29/15 1535    Visit Number 4   Number of Visits 17   Date for PT Re-Evaluation 03/21/15   PT Start Time 1155   PT Stop Time 1520   PT Time Calculation (min) 58 min   Activity Tolerance Patient limited by fatigue   Behavior During Therapy [facility] for tasks assessed/performed      Past Medical History  Diagnosis Date  . Raynaud disease   . Osteoporosis   . Hypertension   . PONV (postoperative nausea and vomiting)   . Wears glasses   . Full dentures   . Fibromyalgia   . Rheumatoid arthritis     "elbows; fingers; toes"  . Osteoarthritis of both knees   . Degenerative disc disease, cervical   . Chronic left shoulder pain     "work related injury" (01/10/2014)  . Breast cancer     "right" (01/10/2014)  . Type II diabetes mellitus     has not taken meds in over a month (01/10/2014)  . Syncope and collapse     "4 times in the last year" (01/10/2014)    Past Surgical History  Procedure Laterality Date  . Colonoscopy    . Mastectomy w/ sentinel node biopsy Right 12/13/2013    Procedure: RIGHT TOTAL MASTECTOMY WITH SENTINEL LYMPH NODE BIOPSY;  Surgeon: Adin Hector, MD;  Location: [place];  Service: General;  Laterality: Right;  . Portacath placement Right 12/13/2013    Procedure: INSERTION PORT-A-CATH;  Surgeon: Adin Hector, MD;  Location: [place];  Service: General;  Laterality: Right;  . Breast reconstruction with placement of tissue expander and flex hd (acellular hydrated dermis) Right 12/13/2013    Procedure: RIGHT BREAST RECONSTRUCTION WITH PLACEMENT OF TISSUE EXPANDER AND FLEX HD  (ACELLULAR HYDRATED DERMIS);  Surgeon: Irene Limbo, MD;  Location: [place];  Service: Plastics;  Laterality: Right;  . Lipoma excision Bilateral 12/13/2013    Procedure: EXCISION OF LEFT BREAST KELOID AND RIGHT CHEST KELOID;  Surgeon: Irene Limbo, MD;  Location: [place];  Service: Plastics;  Laterality: Bilateral;  . Mastectomy    . Cholecystectomy  1970's  . Vaginal hysterectomy  1980    no salpingo-oophoretomu  . Breast biopsy Right 10/2013  . Tissue expander placement Right 01/10/2014    Procedure: Incision and Drainage Right Breast Seroma with TISSUE EXPANDER exchange;  Surgeon: Irene Limbo, MD;  Location: [facility];  Service: Plastics;  Laterality: Right;  . Removal of bilateral tissue expanders with placement of bilateral breast implants Bilateral 09/26/2014    Procedure: REMOVAL OF RIGHT TISSUE EXPANDER WITH PLACEMENT OF RIGHT BREAST IMPLANT LIPOFILLING TO RIGHT BREAST/LEFT BREAST MASTOPEXY FOR SYMMETRY;  Surgeon: Irene Limbo, MD;  Location: [place] [facility];  Service: Plastics;  Laterality: Bilateral;  . Liposuction with lipofilling Right 09/26/2014    Procedure: LIPOSUCTION WITH LIPOFILLING;  Surgeon: Irene Limbo, MD;  Location: [facility];  Service: Plastics;  Laterality: Right;  . Mastopexy Left 09/26/2014    Procedure: MASTOPEXY;  Surgeon: Irene Limbo, MD;  Location: [place];  Service: Plastics;  Laterality: Left;  . Breast reconstruction Right  12/12/2014    Procedure: REVISION OF RIGHT RECONSTRUCTIVE BREAST WITH CAPSULORRHAPHY PLACEMENT OF SILCONE IMPLANTS AND LIPOFILLING TO RIGHT CHEST ;  Surgeon: Irene Limbo, MD;  Location: [place];  Service: Plastics;  Laterality: Right;  . Liposuction with lipofilling Right 12/12/2014    Procedure: LIPOSUCTION WITH LIPOFILLING;  Surgeon: Irene Limbo, MD;  Location: [place];  Service: Plastics;  Laterality:  Right;    There were no vitals filed for this visit.  Visit Diagnosis:  Stiffness of joint, shoulder region, right  Right upper quadrant pain  Balance problem  Physical deconditioning  Lymphedema syndrome, postmastectomy      Subjective Assessment - 01/29/15 1427    Subjective Feel pretty good but it's hot outside and that makes me swell some.   Currently in Pain? No/denies             [facility] Adult PT Treatment/Exercise - 01/29/15 0001    High Level Balance   High Level Balance Activities Side stepping;Backward walking;Turns;Tandem walking;Marching forwards   High Level Balance Comments All in hallway with tactile support for safety; went up and down hallway twice with each activity   Knee/Hip Exercises: Aerobic   Stationary Bike Level 1 x5 minutes   Knee/Hip Exercises: Standing   Heel Raises 2 sets;10 reps   Other Standing Knee Exercises Bil hip 3 way raises (marching, abduction and extension) all 10 reps each, pt returned good demo.   Other Standing Knee Exercises Mini squats against wall x10 with verbal cues to avoid anterior knee stress  Patient easily winded with exercises and required rest/water   Knee/Hip Exercises: Seated   Other Seated Knee Exercises Seated march in sitting x 1 minute   Other Seated Knee Exercises Sit to stand x10 without using hands x10   Manual Therapy   Manual Therapy Manual Lymphatic Drainage (MLD);Other (comment)   Manual Lymphatic Drainage (MLD) In supine:  short neck, left axilla and anterior interaxillary anastomosis, right groin and axillo-inguinal anastomosis; in left sidelying, posterior interaxillary anastomosis right to left and right axillo-inguinal anastomosis.   Other Manual Therapy Issued compression foam to place along lateral thorax between skin and lateral bra to reduce swelling in that area.  Patient reported that felt good and supportive.                   Short Term Clinic Goals - 01/19/15 1230    CC Short  Term Goal  #2   Title Patient will verbalize understanding of lymphedema risk reduction practices   Time 4   Period Weeks   Status New   CC Short Term Goal  #3   Title Patient will report a decrease in pain by 25% so they can perform daily activities with greater ease   Time 0   Period Weeks   Status New             Long Term Clinic Goals - 01/19/15 1230    CC Long Term Goal  #1   Title Patient will be independent in a advanced home program for range of motion and strength    Time 8   Period Weeks   Status New   CC Long Term Goal  #2   Title Patient will report a decrease in pain by 50% so they can perform daily activities with greater ease   Time 8   Period Weeks   Status New   CC Long Term Goal  #3   Title Patient will  decrease the DASH score to <25    to demonstrate increased functional use of upper extremity   Baseline 38.64   CC Long Term Goal  #4   Title pt will report a decrase in axillay and lateral trunk edema by 50%   Time 8   Period Weeks   Status New            Plan - 01/29/15 1535    Clinical Impression Statement Patient was fatigued with exercise and had some shortness of breath but quick recovery.  This may be related to cardiac function due to being on Herceptin.  This is being monitored by her cardiologist.  She reports feeling really good after completion of treatment andstated "I really needed that exercise."  Swelling still evident primarily on her latissimus region and upper right arm.   Pt will benefit from skilled therapeutic intervention in order to improve on the following deficits Decreased strength;Decreased knowledge of precautions;Pain;Increased edema;Decreased range of motion;Decreased activity tolerance;Impaired perceived functional ability;Decreased endurance   Rehab Potential Good   Clinical Impairments Affecting Rehab Potential RA, raynaud's disease, thinning bones and fibromyalia    PT Frequency 2x / week   PT Duration 6 weeks   PT  Treatment/Interventions Balance training;Functional mobility training;Neuromuscular re-education;Manual lymph drainage;Patient/family education   PT Next Visit Plan Meeks Decompression exercises; continue balance exercises as patient felt that was very beneficial and needs to build confidence with balance to reduce fall risk.  Continue manual lymph drainage.   Consulted and Agree with Plan of Care Patient        Problem List Patient Active Problem List   Diagnosis Date Noted  . Constipation 10/04/2014  . Personal history of malignant neoplasm of breast 09/26/2014  . Arthralgia of multiple sites 06/27/2014  . Chemotherapy induced cardiomyopathy 05/11/2014  . Hot flashes, menopausal 04/04/2014  . Chemotherapy-induced neuropathy 03/14/2014  . Anemia, unspecified 03/14/2014  . Hypokalemia 03/01/2014  . Anxiety 01/30/2014  . Breast cancer of lower-inner quadrant of right female breast 10/25/2013  . C-REACTIVE PROTEIN, SERUM, ELEVATED 03/07/2010  . DIABETES MELLITUS, TYPE II 03/04/2010  . ARTHRALGIA 03/04/2010    Annia Friendly, PT 01/29/2015, 3:40 PM  [place], [place], [address] Phone: [phone]   Fax:  [phone]

## 2015-01-30 ENCOUNTER — Other Ambulatory Visit: Payer: Self-pay | Admitting: *Deleted

## 2015-01-30 DIAGNOSIS — D0591 Unspecified type of carcinoma in situ of right breast: Secondary | ICD-10-CM

## 2015-01-30 DIAGNOSIS — Z1231 Encounter for screening mammogram for malignant neoplasm of breast: Secondary | ICD-10-CM

## 2015-01-31 ENCOUNTER — Ambulatory Visit: Payer: Medicare HMO

## 2015-01-31 DIAGNOSIS — I972 Postmastectomy lymphedema syndrome: Secondary | ICD-10-CM | POA: Diagnosis not present

## 2015-01-31 DIAGNOSIS — R5381 Other malaise: Secondary | ICD-10-CM

## 2015-01-31 DIAGNOSIS — R1011 Right upper quadrant pain: Secondary | ICD-10-CM

## 2015-01-31 DIAGNOSIS — R2689 Other abnormalities of gait and mobility: Secondary | ICD-10-CM

## 2015-01-31 DIAGNOSIS — M25611 Stiffness of right shoulder, not elsewhere classified: Secondary | ICD-10-CM

## 2015-01-31 NOTE — Therapy (Signed)
[place], [place], [address] Phone: [phone]   Fax:  [phone]  Physical Therapy Treatment  Patient Details  Name: Angela Gross MRN: [id number] Date of Birth: April 05, 1951 Referring Provider:  Willey Blade, MD  Encounter Date: 01/31/2015      PT End of Session - 01/31/15 1517    Visit Number 5   Number of Visits 17   Date for PT Re-Evaluation 03/21/15   PT Start Time 8841   PT Stop Time 1519   PT Time Calculation (min) 40 min      Past Medical History  Diagnosis Date  . Raynaud disease   . Osteoporosis   . Hypertension   . PONV (postoperative nausea and vomiting)   . Wears glasses   . Full dentures   . Fibromyalgia   . Rheumatoid arthritis     "elbows; fingers; toes"  . Osteoarthritis of both knees   . Degenerative disc disease, cervical   . Chronic left shoulder pain     "work related injury" (01/10/2014)  . Breast cancer     "right" (01/10/2014)  . Type II diabetes mellitus     has not taken meds in over a month (01/10/2014)  . Syncope and collapse     "4 times in the last year" (01/10/2014)    Past Surgical History  Procedure Laterality Date  . Colonoscopy    . Mastectomy w/ sentinel node biopsy Right 12/13/2013    Procedure: RIGHT TOTAL MASTECTOMY WITH SENTINEL LYMPH NODE BIOPSY;  Surgeon: Adin Hector, MD;  Location: [facility];  Service: General;  Laterality: Right;  . Portacath placement Right 12/13/2013    Procedure: INSERTION PORT-A-CATH;  Surgeon: Adin Hector, MD;  Location: [facility];  Service: General;  Laterality: Right;  . Breast reconstruction with placement of tissue expander and flex hd (acellular hydrated dermis) Right 12/13/2013    Procedure: RIGHT BREAST RECONSTRUCTION WITH PLACEMENT OF TISSUE EXPANDER AND FLEX HD (ACELLULAR HYDRATED DERMIS);  Surgeon: Irene Limbo, MD;  Location: [facility];  Service:  Plastics;  Laterality: Right;  . Lipoma excision Bilateral 12/13/2013    Procedure: EXCISION OF LEFT BREAST KELOID AND RIGHT CHEST KELOID;  Surgeon: Irene Limbo, MD;  Location: [facility];  Service: Plastics;  Laterality: Bilateral;  . Mastectomy    . Cholecystectomy  1970's  . Vaginal hysterectomy  1980    no salpingo-oophoretomu  . Breast biopsy Right 10/2013  . Tissue expander placement Right 01/10/2014    Procedure: Incision and Drainage Right Breast Seroma with TISSUE EXPANDER exchange;  Surgeon: Irene Limbo, MD;  Location: [facility];  Service: Plastics;  Laterality: Right;  . Removal of bilateral tissue expanders with placement of bilateral breast implants Bilateral 09/26/2014    Procedure: REMOVAL OF RIGHT TISSUE EXPANDER WITH PLACEMENT OF RIGHT BREAST IMPLANT LIPOFILLING TO RIGHT BREAST/LEFT BREAST MASTOPEXY FOR SYMMETRY;  Surgeon: Irene Limbo, MD;  Location: [facility];  Service: Plastics;  Laterality: Bilateral;  . Liposuction with lipofilling Right 09/26/2014    Procedure: LIPOSUCTION WITH LIPOFILLING;  Surgeon: Irene Limbo, MD;  Location: [facility];  Service: Plastics;  Laterality: Right;  . Mastopexy Left 09/26/2014    Procedure: MASTOPEXY;  Surgeon: Irene Limbo, MD;  Location: [facility];  Service: Plastics;  Laterality: Left;  . Breast reconstruction Right 12/12/2014    Procedure: REVISION OF RIGHT RECONSTRUCTIVE BREAST WITH CAPSULORRHAPHY PLACEMENT OF SILCONE IMPLANTS AND  LIPOFILLING TO RIGHT CHEST ;  Surgeon: Irene Limbo, MD;  Location: [facility];  Service: Plastics;  Laterality: Right;  . Liposuction with lipofilling Right 12/12/2014    Procedure: LIPOSUCTION WITH LIPOFILLING;  Surgeon: Irene Limbo, MD;  Location: [facility];  Service: Plastics;  Laterality: Right;    There were no vitals filed for this visit.  Visit Diagnosis:  Stiffness of joint, shoulder  region, right  Right upper quadrant pain  Balance problem  Physical deconditioning  Lymphedema syndrome, postmastectomy      Subjective Assessment - 01/31/15 1440    Subjective Feeling good today, a little rushed because I've been at the hospital with my husband. The veins in his LE's are blocked 95% on one side and 100% on the other, they are planning surgery. I'm not as SOB today as I was last time.   Currently in Pain? No/denies                         [facility] Adult PT Treatment/Exercise - 01/31/15 0001    Knee/Hip Exercises: Aerobic   Stationary Bike Level 1 x5 minutes   Knee/Hip Exercises: Standing   Heel Raises 20 reps   Other Standing Knee Exercises Bil hip 3 way raises (marching, abduction and extension) all 10 reps each, pt returned good demo.   Shoulder Exercises: Supine   Other Supine Exercises Meeks Decompression Exercises all 5 reps, 5 second holds for head press, shoulder press, leg press and leg lengthener.    Manual Therapy   Manual Lymphatic Drainage (MLD) In supine:  short neck, left axilla and anterior interaxillary anastomosis, right groin and axillo-inguinal anastomosis; in left sidelying, posterior interaxillary anastomosis right to left and right axillo-inguinal anastomosis.                   Short Term Clinic Goals - 01/19/15 1230    CC Short Term Goal  #2   Title Patient will verbalize understanding of lymphedema risk reduction practices   Time 4   Period Weeks   Status New   CC Short Term Goal  #3   Title Patient will report a decrease in pain by 25% so they can perform daily activities with greater ease   Time 0   Period Weeks   Status New             Long Term Clinic Goals - 01/19/15 1230    CC Long Term Goal  #1   Title Patient will be independent in a advanced home program for range of motion and strength    Time 8   Period Weeks   Status New   CC Long Term Goal  #2   Title Patient will report a decrease  in pain by 50% so they can perform daily activities with greater ease   Time 8   Period Weeks   Status New   CC Long Term Goal  #3   Title Patient will decrease the DASH score to <25    to demonstrate increased functional use of upper extremity   Baseline 38.64   CC Long Term Goal  #4   Title pt will report a decrase in axillay and lateral trunk edema by 50%   Time 8   Period Weeks   Status New            Plan - 01/31/15 1518    Clinical Impression Statement Pt was very minimally fatigued with  exercises today, much improved from last treatment. She reports learned that she just needs to slow down and this has helped. The compression issued to pt last treatment she reports has helped as well, bra doesnt feel as tight now.   Pt will benefit from skilled therapeutic intervention in order to improve on the following deficits Decreased strength;Decreased knowledge of precautions;Pain;Increased edema;Decreased range of motion;Decreased activity tolerance;Impaired perceived functional ability;Decreased endurance   Rehab Potential Good   Clinical Impairments Affecting Rehab Potential RA, raynaud's disease, thinning bones and fibromyalia    PT Frequency 2x / week   PT Duration 6 weeks   PT Treatment/Interventions Balance training;Functional mobility training;Neuromuscular re-education;Manual lymph drainage;Patient/family education   PT Next Visit Plan continue balance exercises as patient felt that was very beneficial and needs to build confidence with balance to reduce fall risk.  Continue manual lymph drainage.   Consulted and Agree with Plan of Care Patient        Problem List Patient Active Problem List   Diagnosis Date Noted  . Constipation 10/04/2014  . Personal history of malignant neoplasm of breast 09/26/2014  . Arthralgia of multiple sites 06/27/2014  . Chemotherapy induced cardiomyopathy 05/11/2014  . Hot flashes, menopausal 04/04/2014  . Chemotherapy-induced neuropathy  03/14/2014  . Anemia, unspecified 03/14/2014  . Hypokalemia 03/01/2014  . Anxiety 01/30/2014  . Breast cancer of lower-inner quadrant of right female breast 10/25/2013  . C-REACTIVE PROTEIN, SERUM, ELEVATED 03/07/2010  . DIABETES MELLITUS, TYPE II 03/04/2010  . ARTHRALGIA 03/04/2010    Otelia Limes, PTA 01/31/2015, 3:21 PM  [place], [place], [address] Phone: [phone]   Fax:  [phone]

## 2015-02-06 ENCOUNTER — Other Ambulatory Visit: Payer: Self-pay | Admitting: Oncology

## 2015-02-06 ENCOUNTER — Ambulatory Visit: Payer: Medicare HMO

## 2015-02-06 ENCOUNTER — Ambulatory Visit (HOSPITAL_BASED_OUTPATIENT_CLINIC_OR_DEPARTMENT_OTHER): Payer: Medicare HMO

## 2015-02-06 ENCOUNTER — Other Ambulatory Visit (HOSPITAL_BASED_OUTPATIENT_CLINIC_OR_DEPARTMENT_OTHER): Payer: Medicare HMO

## 2015-02-06 VITALS — BP 134/79 | HR 79 | Temp 98.1°F | Resp 18

## 2015-02-06 DIAGNOSIS — Z5112 Encounter for antineoplastic immunotherapy: Secondary | ICD-10-CM

## 2015-02-06 DIAGNOSIS — C50811 Malignant neoplasm of overlapping sites of right female breast: Secondary | ICD-10-CM

## 2015-02-06 DIAGNOSIS — C50311 Malignant neoplasm of lower-inner quadrant of right female breast: Secondary | ICD-10-CM

## 2015-02-06 DIAGNOSIS — Z95828 Presence of other vascular implants and grafts: Secondary | ICD-10-CM

## 2015-02-06 LAB — CBC WITH DIFFERENTIAL/PLATELET
BASO%: 0.1 % (ref 0.0–2.0)
Basophils Absolute: 0 10*3/uL (ref 0.0–0.1)
EOS ABS: 0.1 10*3/uL (ref 0.0–0.5)
EOS%: 1.3 % (ref 0.0–7.0)
HCT: 37.7 % (ref 34.8–46.6)
HEMOGLOBIN: 12.2 g/dL (ref 11.6–15.9)
LYMPH%: 29.7 % (ref 14.0–49.7)
MCH: 26.6 pg (ref 25.1–34.0)
MCHC: 32.4 g/dL (ref 31.5–36.0)
MCV: 82.1 fL (ref 79.5–101.0)
MONO#: 1.1 10*3/uL — AB (ref 0.1–0.9)
MONO%: 10.4 % (ref 0.0–14.0)
NEUT#: 5.9 10*3/uL (ref 1.5–6.5)
NEUT%: 58.5 % (ref 38.4–76.8)
PLATELETS: 174 10*3/uL (ref 145–400)
RBC: 4.59 10*6/uL (ref 3.70–5.45)
RDW: 14.3 % (ref 11.2–14.5)
WBC: 10.1 10*3/uL (ref 3.9–10.3)
lymph#: 3 10*3/uL (ref 0.9–3.3)

## 2015-02-06 LAB — COMPREHENSIVE METABOLIC PANEL (CC13)
ALT: 14 U/L (ref 0–55)
AST: 19 U/L (ref 5–34)
Albumin: 3.4 g/dL — ABNORMAL LOW (ref 3.5–5.0)
Alkaline Phosphatase: 122 U/L (ref 40–150)
Anion Gap: 13 mEq/L — ABNORMAL HIGH (ref 3–11)
BUN: 16.3 mg/dL (ref 7.0–26.0)
CHLORIDE: 106 meq/L (ref 98–109)
CO2: 23 meq/L (ref 22–29)
Calcium: 9 mg/dL (ref 8.4–10.4)
Creatinine: 1.1 mg/dL (ref 0.6–1.1)
EGFR: 62 mL/min/{1.73_m2} — ABNORMAL LOW (ref >90)
Glucose: 116 mg/dl (ref 70–140)
Potassium: 3.8 mEq/L (ref 3.5–5.1)
SODIUM: 142 meq/L (ref 136–145)
Total Bilirubin: 0.26 mg/dL (ref 0.20–1.20)
Total Protein: 7.1 g/dL (ref 6.4–8.3)

## 2015-02-06 MED ORDER — TRASTUZUMAB CHEMO INJECTION 440 MG
6.0000 mg/kg | Freq: Once | INTRAVENOUS | Status: AC
  Administered 2015-02-06: 420 mg via INTRAVENOUS
  Filled 2015-02-06: qty 20

## 2015-02-06 MED ORDER — SODIUM CHLORIDE 0.9 % IJ SOLN
10.0000 mL | INTRAMUSCULAR | Status: DC | PRN
  Administered 2015-02-06: 10 mL
  Filled 2015-02-06: qty 10

## 2015-02-06 MED ORDER — DIPHENHYDRAMINE HCL 25 MG PO CAPS
25.0000 mg | ORAL_CAPSULE | Freq: Once | ORAL | Status: AC
  Administered 2015-02-06: 25 mg via ORAL

## 2015-02-06 MED ORDER — SODIUM CHLORIDE 0.9 % IV SOLN
Freq: Once | INTRAVENOUS | Status: AC
  Administered 2015-02-06: 11:00:00 via INTRAVENOUS

## 2015-02-06 MED ORDER — DIPHENHYDRAMINE HCL 25 MG PO CAPS
ORAL_CAPSULE | ORAL | Status: AC
  Filled 2015-02-06: qty 1

## 2015-02-06 MED ORDER — SODIUM CHLORIDE 0.9 % IJ SOLN
10.0000 mL | INTRAMUSCULAR | Status: DC | PRN
  Administered 2015-02-06: 10 mL via INTRAVENOUS
  Filled 2015-02-06: qty 10

## 2015-02-06 MED ORDER — HEPARIN SOD (PORK) LOCK FLUSH 100 UNIT/ML IV SOLN
500.0000 [IU] | Freq: Once | INTRAVENOUS | Status: AC | PRN
  Administered 2015-02-06: 500 [IU]
  Filled 2015-02-06: qty 5

## 2015-02-06 MED ORDER — ACETAMINOPHEN 325 MG PO TABS
ORAL_TABLET | ORAL | Status: AC
Start: 2015-02-06 — End: 2015-02-06
  Filled 2015-02-06: qty 2

## 2015-02-06 MED ORDER — ACETAMINOPHEN 325 MG PO TABS
650.0000 mg | ORAL_TABLET | Freq: Once | ORAL | Status: AC
  Administered 2015-02-06: 650 mg via ORAL

## 2015-02-06 NOTE — Patient Instructions (Signed)

## 2015-02-06 NOTE — Patient Instructions (Signed)
[facility] Discharge Instructions for Patients Receiving Chemotherapy  Today you received the following chemotherapy agents Herceptin.  To help prevent nausea and vomiting after your treatment, we encourage you to take your nausea medication as prescribed by your physician. If you develop nausea and vomiting that is not controlled by your nausea medication, call the clinic.   BELOW ARE SYMPTOMS THAT SHOULD BE REPORTED IMMEDIATELY:  *FEVER GREATER THAN 100.5 F  *CHILLS WITH OR WITHOUT FEVER  NAUSEA AND VOMITING THAT IS NOT CONTROLLED WITH YOUR NAUSEA MEDICATION  *UNUSUAL SHORTNESS OF BREATH  *UNUSUAL BRUISING OR BLEEDING  TENDERNESS IN MOUTH AND THROAT WITH OR WITHOUT PRESENCE OF ULCERS  *URINARY PROBLEMS  *BOWEL PROBLEMS  UNUSUAL RASH Items with * indicate a potential emergency and should be followed up as soon as possible.  Feel free to call the clinic you have any questions or concerns. The clinic phone number is [phone].  Please show the CHEMO ALERT CARD at check-in to the Emergency Department and triage nurse.   

## 2015-02-07 ENCOUNTER — Other Ambulatory Visit: Payer: Self-pay | Admitting: *Deleted

## 2015-02-07 ENCOUNTER — Ambulatory Visit: Payer: Medicare HMO

## 2015-02-07 DIAGNOSIS — R1011 Right upper quadrant pain: Secondary | ICD-10-CM

## 2015-02-07 DIAGNOSIS — R5381 Other malaise: Secondary | ICD-10-CM

## 2015-02-07 DIAGNOSIS — M25611 Stiffness of right shoulder, not elsewhere classified: Secondary | ICD-10-CM

## 2015-02-07 DIAGNOSIS — R2689 Other abnormalities of gait and mobility: Secondary | ICD-10-CM

## 2015-02-07 DIAGNOSIS — I972 Postmastectomy lymphedema syndrome: Secondary | ICD-10-CM | POA: Diagnosis not present

## 2015-02-07 DIAGNOSIS — M2550 Pain in unspecified joint: Secondary | ICD-10-CM

## 2015-02-07 MED ORDER — OXYCODONE HCL 5 MG PO TABS: 5.0000 mg | ORAL_TABLET | ORAL | Status: DC | PRN

## 2015-02-07 MED ORDER — TRAMADOL HCL 50 MG PO TABS: 50.0000 mg | ORAL_TABLET | Freq: Three times a day (TID) | ORAL | Status: DC | PRN

## 2015-02-07 NOTE — Therapy (Signed)
[place], [place], [address] Phone: [phone]   Fax:  [phone]  Physical Therapy Treatment  Patient Details  Name: Angela Gross MRN: [id number] Date of Birth: [birth year] Referring Provider:  Willey Blade, MD  Encounter Date: 02/07/2015      PT End of Session - 02/07/15 1504    Visit Number 6   Number of Visits 17   Date for PT Re-Evaluation 03/21/15   PT Start Time 1440   PT Stop Time 1524   PT Time Calculation (min) 44 min      Past Medical History  Diagnosis Date  . Raynaud disease   . Osteoporosis   . Hypertension   . PONV (postoperative nausea and vomiting)   . Wears glasses   . Full dentures   . Fibromyalgia   . Rheumatoid arthritis     "elbows; fingers; toes"  . Osteoarthritis of both knees   . Degenerative disc disease, cervical   . Chronic left shoulder pain     "work related injury" (01/10/2014)  . Breast cancer     "right" (01/10/2014)  . Type II diabetes mellitus     has not taken meds in over a month (01/10/2014)  . Syncope and collapse     "4 times in the last year" (01/10/2014)    Past Surgical History  Procedure Laterality Date  . Colonoscopy    . Mastectomy w/ sentinel node biopsy Right 12/13/2013    Procedure: RIGHT TOTAL MASTECTOMY WITH SENTINEL LYMPH NODE BIOPSY;  Surgeon: Adin Hector, MD;  Location: [facility];  Service: General;  Laterality: Right;  . Portacath placement Right 12/13/2013    Procedure: INSERTION PORT-A-CATH;  Surgeon: Adin Hector, MD;  Location: [facility];  Service: General;  Laterality: Right;  . Breast reconstruction with placement of tissue expander and flex hd (acellular hydrated dermis) Right 12/13/2013    Procedure: RIGHT BREAST RECONSTRUCTION WITH PLACEMENT OF TISSUE EXPANDER AND FLEX HD (ACELLULAR HYDRATED DERMIS);  Surgeon: Irene Limbo, MD;  Location: [facility];  Service:  Plastics;  Laterality: Right;  . Lipoma excision Bilateral 12/13/2013    Procedure: EXCISION OF LEFT BREAST KELOID AND RIGHT CHEST KELOID;  Surgeon: Irene Limbo, MD;  Location: [facility];  Service: Plastics;  Laterality: Bilateral;  . Mastectomy    . Cholecystectomy  1970's  . Vaginal hysterectomy  1980    no salpingo-oophoretomu  . Breast biopsy Right 10/2013  . Tissue expander placement Right 01/10/2014    Procedure: Incision and Drainage Right Breast Seroma with TISSUE EXPANDER exchange;  Surgeon: Irene Limbo, MD;  Location: [facility];  Service: Plastics;  Laterality: Right;  . Removal of bilateral tissue expanders with placement of bilateral breast implants Bilateral 09/26/2014    Procedure: REMOVAL OF RIGHT TISSUE EXPANDER WITH PLACEMENT OF RIGHT BREAST IMPLANT LIPOFILLING TO RIGHT BREAST/LEFT BREAST MASTOPEXY FOR SYMMETRY;  Surgeon: Irene Limbo, MD;  Location: [facility];  Service: Plastics;  Laterality: Bilateral;  . Liposuction with lipofilling Right 09/26/2014    Procedure: LIPOSUCTION WITH LIPOFILLING;  Surgeon: Irene Limbo, MD;  Location: [facility];  Service: Plastics;  Laterality: Right;  . Mastopexy Left 09/26/2014    Procedure: MASTOPEXY;  Surgeon: Irene Limbo, MD;  Location: [facility];  Service: Plastics;  Laterality: Left;  . Breast reconstruction Right 12/12/2014    Procedure: REVISION OF RIGHT RECONSTRUCTIVE BREAST WITH CAPSULORRHAPHY PLACEMENT OF SILCONE IMPLANTS AND  LIPOFILLING TO RIGHT CHEST ;  Surgeon: Irene Limbo, MD;  Location: [place] [facility];  Service: Plastics;  Laterality: Right;  . Liposuction with lipofilling Right 12/12/2014    Procedure: LIPOSUCTION WITH LIPOFILLING;  Surgeon: Irene Limbo, MD;  Location: [facility];  Service: Plastics;  Laterality: Right;    There were no vitals filed for this visit.  Visit Diagnosis:  Stiffness of joint, shoulder  region, right  Right upper quadrant pain  Balance problem  Physical deconditioning  Lymphedema syndrome, postmastectomy      Subjective Assessment - 02/07/15 1444    Subjective Feel a little more fatigued today than last time, and my hands are swollen again, feel stiff. My ankle feels weak today, wearing my ankle brace, its from an old fall.   Currently in Pain? No/denies                         [facility] Adult PT Treatment/Exercise - 02/07/15 0001    Knee/Hip Exercises: Aerobic   Stationary Bike Level 2 x5 minutes   Knee/Hip Exercises: Standing   Heel Raises 20 reps  On balance disc holding back of bike   Heel Raises Limitations No ankle pain   Wall Squat 10 reps   Wall Squat Limitations Cuing for correct technique for feet to be farther from wall avoiding anterior knee stress   Other Standing Knee Exercises Bil hip 3 way raises (marching, abduction and extension) all 10 reps each, fingertip support on back of bike and 1 lb on each   Other Standing Knee Exercises Standing on tilt board: Balance side-side with min-no HHA for 1 min, then weight shift x30 seconds, then same for front-back   Manual Therapy   Manual Lymphatic Drainage (MLD) In supine:  short neck, left axilla and anterior inter-axillary anastomosis, right groin and axillo-inguinal anastomosis and Rt UE from dorsal hand to lateral shoulder; in left sidelying, posterior inter-axillary anastomosis right to left and right axillo-inguinal anastomosis.                   Short Term Clinic Goals - 01/19/15 1230    CC Short Term Goal  #2   Title Patient will verbalize understanding of lymphedema risk reduction practices   Time 4   Period Weeks   Status New   CC Short Term Goal  #3   Title Patient will report a decrease in pain by 25% so they can perform daily activities with greater ease   Time 0   Period Weeks   Status New             Long Term Clinic Goals - 01/19/15 1230    CC Long  Term Goal  #1   Title Patient will be independent in a advanced home program for range of motion and strength    Time 8   Period Weeks   Status New   CC Long Term Goal  #2   Title Patient will report a decrease in pain by 50% so they can perform daily activities with greater ease   Time 8   Period Weeks   Status New   CC Long Term Goal  #3   Title Patient will decrease the DASH score to <25    to demonstrate increased functional use of upper extremity   Baseline 38.64   CC Long Term Goal  #4   Title pt will report a decrase in axillay and lateral trunk edema  by 50%   Time 8   Period Weeks   Status New            Plan - 02/07/15 1526    Clinical Impression Statement Pt reported increase fatigue today upon arrival, but was able to perform exercises with minimal rest breaks today. Added weights to LE exercises and progressed balance activities and pt tolerated this well.    Pt will benefit from skilled therapeutic intervention in order to improve on the following deficits Decreased strength;Decreased knowledge of precautions;Pain;Increased edema;Decreased range of motion;Decreased activity tolerance;Impaired perceived functional ability;Decreased endurance   Rehab Potential Good   Clinical Impairments Affecting Rehab Potential RA, raynaud's disease, thinning bones and fibromyalia    PT Frequency 2x / week   PT Duration 6 weeks   PT Treatment/Interventions Balance training;Functional mobility training;Neuromuscular re-education;Manual lymph drainage;Patient/family education   PT Next Visit Plan Assess goals next visit; continue balance exercises as patient felt that was very beneficial and needs to build confidence with balance to reduce fall risk.  Continue manual lymph drainage.   Consulted and Agree with Plan of Care Patient        Problem List Patient Active Problem List   Diagnosis Date Noted  . Constipation 10/04/2014  . Personal history of malignant neoplasm of breast  09/26/2014  . Arthralgia of multiple sites 06/27/2014  . Chemotherapy induced cardiomyopathy 05/11/2014  . Hot flashes, menopausal 04/04/2014  . Chemotherapy-induced neuropathy 03/14/2014  . Anemia, unspecified 03/14/2014  . Hypokalemia 03/01/2014  . Anxiety 01/30/2014  . Breast cancer of lower-inner quadrant of right female breast 10/25/2013  . C-REACTIVE PROTEIN, SERUM, ELEVATED 03/07/2010  . DIABETES MELLITUS, TYPE II 03/04/2010  . ARTHRALGIA 03/04/2010    Otelia Limes, PTA 02/07/2015, 3:33 PM  [place], [place], [address] Phone: [phone]   Fax:  [phone]

## 2015-02-09 ENCOUNTER — Ambulatory Visit: Payer: Medicare HMO | Admitting: Physical Therapy

## 2015-02-09 DIAGNOSIS — I972 Postmastectomy lymphedema syndrome: Secondary | ICD-10-CM | POA: Diagnosis not present

## 2015-02-09 DIAGNOSIS — R2689 Other abnormalities of gait and mobility: Secondary | ICD-10-CM

## 2015-02-09 DIAGNOSIS — R1011 Right upper quadrant pain: Secondary | ICD-10-CM

## 2015-02-09 DIAGNOSIS — M25611 Stiffness of right shoulder, not elsewhere classified: Secondary | ICD-10-CM

## 2015-02-09 DIAGNOSIS — R5381 Other malaise: Secondary | ICD-10-CM

## 2015-02-09 NOTE — Therapy (Signed)
[place], [place], [address] Phone: [phone]   Fax:  [phone]  Physical Therapy Treatment  Patient Details  Name: Angela Gross MRN: [id number] Date of Birth: 02-18-1951 Referring Provider:  Irene Limbo, MD  Encounter Date: 02/09/2015      PT End of Session - 02/09/15 1007    Visit Number 7   Number of Visits 17   Date for PT Re-Evaluation 03/21/15   PT Start Time 0935   PT Stop Time 1017   PT Time Calculation (min) 42 min   Activity Tolerance Patient tolerated treatment well   Behavior During Therapy [facility] for tasks assessed/performed      Past Medical History  Diagnosis Date  . Raynaud disease   . Osteoporosis   . Hypertension   . PONV (postoperative nausea and vomiting)   . Wears glasses   . Full dentures   . Fibromyalgia   . Rheumatoid arthritis     "elbows; fingers; toes"  . Osteoarthritis of both knees   . Degenerative disc disease, cervical   . Chronic left shoulder pain     "work related injury" (01/10/2014)  . Breast cancer     "right" (01/10/2014)  . Type II diabetes mellitus     has not taken meds in over a month (01/10/2014)  . Syncope and collapse     "4 times in the last year" (01/10/2014)    Past Surgical History  Procedure Laterality Date  . Colonoscopy    . Mastectomy w/ sentinel node biopsy Right 12/13/2013    Procedure: RIGHT TOTAL MASTECTOMY WITH SENTINEL LYMPH NODE BIOPSY;  Surgeon: Adin Hector, MD;  Location: [place];  Service: General;  Laterality: Right;  . Portacath placement Right 12/13/2013    Procedure: INSERTION PORT-A-CATH;  Surgeon: Adin Hector, MD;  Location: [place];  Service: General;  Laterality: Right;  . Breast reconstruction with placement of tissue expander and flex hd (acellular hydrated dermis) Right 12/13/2013    Procedure: RIGHT BREAST RECONSTRUCTION WITH PLACEMENT OF TISSUE EXPANDER AND FLEX  HD (ACELLULAR HYDRATED DERMIS);  Surgeon: Irene Limbo, MD;  Location: [place];  Service: Plastics;  Laterality: Right;  . Lipoma excision Bilateral 12/13/2013    Procedure: EXCISION OF LEFT BREAST KELOID AND RIGHT CHEST KELOID;  Surgeon: Irene Limbo, MD;  Location: [place];  Service: Plastics;  Laterality: Bilateral;  . Mastectomy    . Cholecystectomy  1970's  . Vaginal hysterectomy  1980    no salpingo-oophoretomu  . Breast biopsy Right 10/2013  . Tissue expander placement Right 01/10/2014    Procedure: Incision and Drainage Right Breast Seroma with TISSUE EXPANDER exchange;  Surgeon: Irene Limbo, MD;  Location: [place];  Service: Plastics;  Laterality: Right;  . Removal of bilateral tissue expanders with placement of bilateral breast implants Bilateral 09/26/2014    Procedure: REMOVAL OF RIGHT TISSUE EXPANDER WITH PLACEMENT OF RIGHT BREAST IMPLANT LIPOFILLING TO RIGHT BREAST/LEFT BREAST MASTOPEXY FOR SYMMETRY;  Surgeon: Irene Limbo, MD;  Location: [place];  Service: Plastics;  Laterality: Bilateral;  . Liposuction with lipofilling Right 09/26/2014    Procedure: LIPOSUCTION WITH LIPOFILLING;  Surgeon: Irene Limbo, MD;  Location: [place];  Service: Plastics;  Laterality: Right;  . Mastopexy Left 09/26/2014    Procedure: MASTOPEXY;  Surgeon: Irene Limbo, MD;  Location: [place];  Service: Plastics;  Laterality: Left;  . Breast reconstruction Right  12/12/2014    Procedure: REVISION OF RIGHT RECONSTRUCTIVE BREAST WITH CAPSULORRHAPHY PLACEMENT OF SILCONE IMPLANTS AND LIPOFILLING TO RIGHT CHEST ;  Surgeon: Irene Limbo, MD;  Location: [place];  Service: Plastics;  Laterality: Right;  . Liposuction with lipofilling Right 12/12/2014    Procedure: LIPOSUCTION WITH LIPOFILLING;  Surgeon: Irene Limbo, MD;  Location: [place];  Service: Plastics;   Laterality: Right;    There were no vitals filed for this visit.  Visit Diagnosis:  Stiffness of joint, shoulder region, right  Right upper quadrant pain  Balance problem  Physical deconditioning  Lymphedema syndrome, postmastectomy      Subjective Assessment - 02/09/15 0942    Subjective pt still has some pain in right shoulder pain and pai nin elbows and wrists.  She says her pain is much better than when she started and she has less puffiness under her arm.    Pertinent History breast cancer diagnosed Oct 12 2013, masatectomy with expander placement, implant placed that had to be remonved and then another put in December 12 2014 chemotherapy in 2015 that witll be completed 5/17/ 2016.  Also plans to have port removed and nipple places sometime in may    Patient Stated Goals to be able to limit swelling under her arm and increase her range of motion so that she doesn't get the shooting pain , wants to return to walking program She has just joined the [facility]   Currently in Pain? Yes   Pain Score 5    Pain Location --  upper body wrists, elbows and under her right arm   Pain Orientation Right;Left   Pain Descriptors / Indicators Aching  stiffness   Pain Type Chronic pain   Pain Radiating Towards down arms   Pain Onset More than a month ago   Pain Frequency Intermittent  every days    Aggravating Factors  reaching, getting pain    Pain Relieving Factors infusion for arthritis helps, but she hasn't been able to get it.  the exercise is really helping her stretch out    Effect of Pain on Daily Activities difficulty with cleaning and cooking                         [facility] Adult PT Treatment/Exercise - 02/09/15 0001    Knee/Hip Exercises: Aerobic   Stationary Bike Level 2 x 10  minutes  hr 107, O2 sat 94, RPE 7 . ready to get off bike for 10 min   Knee/Hip Exercises: Standing   Heel Raises 10 reps   Wall Squat 20 reps   Wall Squat Limitations Cuing for correct  technique to get hips backward    Other Standing Knee Exercises Bil hip 3 way raises (marching, abduction and extension) all 10 reps each, pt returned good demo.   Knee/Hip Exercises: Seated   Other Seated Knee Exercises Seated march in sitting x 1 minute   Shoulder Exercises: Supine   Other Supine Exercises supine subscap series with red theraband 10 reps each   Shoulder Exercises: Pulleys   Flexion 2 minutes   ABduction 2 minutes                PT Education - 02/09/15 1006    Education provided Yes   Education Details subscapular supine theraband series   Person(s) Educated Patient   Methods Explanation;Demonstration;Handout   Comprehension Returned demonstration  Short Term Clinic Goals - 02/09/15 1001    CC Short Term Goal  #1   Title Patient will be independent in a basic  home program for range of motion and strength   Status Achieved   CC Short Term Goal  #2   Title Patient will verbalize understanding of lymphedema risk reduction practices   Status On-going   CC Short Term Goal  #3   Title Patient will report a decrease in pain by 25% so they can perform daily activities with greater ease   Status Achieved             Long Term Clinic Goals - 02/09/15 1000    CC Long Term Goal  #1   Title Patient will be independent in a advanced home program for range of motion and strength    Status On-going   CC Long Term Goal  #2   Title Patient will report a decrease in pain by 50% so they can perform daily activities with greater ease   Status On-going   CC Long Term Goal  #3   Title Patient will decrease the DASH score to <25    to demonstrate increased functional use of upper extremity   Status On-going   CC Long Term Goal  #4   Title pt will report a decrase in axillay and lateral trunk edema by 50%   Status Achieved            Plan - 02/09/15 0959    Clinical Impression Statement very good exercise performanc with pt able to complete  all repetitions.  Issued pamphlet about [employer] program at [facility] and pt will call about it.         Problem List Patient Active Problem List   Diagnosis Date Noted  . Constipation 10/04/2014  . Personal history of malignant neoplasm of breast 09/26/2014  . Arthralgia of multiple sites 06/27/2014  . Chemotherapy induced cardiomyopathy 05/11/2014  . Hot flashes, menopausal 04/04/2014  . Chemotherapy-induced neuropathy 03/14/2014  . Anemia, unspecified 03/14/2014  . Hypokalemia 03/01/2014  . Anxiety 01/30/2014  . Breast cancer of lower-inner quadrant of right female breast 10/25/2013  . C-REACTIVE PROTEIN, SERUM, ELEVATED 03/07/2010  . DIABETES MELLITUS, TYPE II 03/04/2010  . ARTHRALGIA 03/04/2010   Donato Heinz. Owens Shark PT   Norwood Levo 02/09/2015, 10:18 AM  [place], [place], [address] Phone: [phone]   Fax:  [phone]

## 2015-02-09 NOTE — Patient Instructions (Signed)
Over Head Pull: Narrow Grip     [place] [phone]   On back, knees bent, feet flat, band across thighs, elbows straight but relaxed. Pull hands apart (start). Keeping elbows straight, bring arms up and over head, hands toward floor. Keep pull steady on band. Hold momentarily. Return slowly, keeping pull steady, back to start. Repeat __10_ times. Band color __red ____   Side Pull: Double Arm   On back, knees bent, feet flat. Arms perpendicular to body, shoulder level, elbows straight but relaxed. Pull arms out to sides, elbows straight. Resistance band comes across collarbones, hands toward floor. Hold momentarily. Slowly return to starting position. Repeat _10__ times. Band color __red__   Sash   On back, knees bent, feet flat, left hand on left hip, right hand above left. Pull right arm DIAGONALLY (hip to shoulder) across chest. Bring right arm along head toward floor. Hold momentarily. Slowly return to starting position. Repeat _10__ times. Do with left arm. Band color __red____   Shoulder Rotation: Double Arm   On back, knees bent, feet flat, elbows tucked at sides, bent 90, hands palms up. Pull hands apart and down toward floor, keeping elbows near sides. Hold momentarily. Slowly return to starting position. Repeat _10__ times. Band color ___red___    

## 2015-02-12 ENCOUNTER — Ambulatory Visit: Payer: Medicare HMO | Admitting: Physical Therapy

## 2015-02-13 ENCOUNTER — Ambulatory Visit: Payer: Medicare HMO | Attending: Plastic Surgery

## 2015-02-13 DIAGNOSIS — I972 Postmastectomy lymphedema syndrome: Secondary | ICD-10-CM | POA: Insufficient documentation

## 2015-02-13 DIAGNOSIS — R5381 Other malaise: Secondary | ICD-10-CM | POA: Insufficient documentation

## 2015-02-13 DIAGNOSIS — R1011 Right upper quadrant pain: Secondary | ICD-10-CM

## 2015-02-13 DIAGNOSIS — R2689 Other abnormalities of gait and mobility: Secondary | ICD-10-CM

## 2015-02-13 DIAGNOSIS — M25611 Stiffness of right shoulder, not elsewhere classified: Secondary | ICD-10-CM | POA: Insufficient documentation

## 2015-02-13 NOTE — Therapy (Signed)
[place], [place], [address] Phone: [phone]   Fax:  [phone]  Physical Therapy Treatment  Patient Details  Name: Angela Gross MRN: [id number] Date of Birth: [birth year] Referring Provider:  Willey Blade, MD  Encounter Date: 02/13/2015      PT End of Session - 02/13/15 1053    Visit Number 8   Number of Visits 17   Date for PT Re-Evaluation 03/21/15   PT Start Time 7017   PT Stop Time 1103   PT Time Calculation (min) 40 min      Past Medical History  Diagnosis Date  . Raynaud disease   . Osteoporosis   . Hypertension   . PONV (postoperative nausea and vomiting)   . Wears glasses   . Full dentures   . Fibromyalgia   . Rheumatoid arthritis     "elbows; fingers; toes"  . Osteoarthritis of both knees   . Degenerative disc disease, cervical   . Chronic left shoulder pain     "work related injury" (01/10/2014)  . Breast cancer     "right" (01/10/2014)  . Type II diabetes mellitus     has not taken meds in over a month (01/10/2014)  . Syncope and collapse     "4 times in the last year" (01/10/2014)    Past Surgical History  Procedure Laterality Date  . Colonoscopy    . Mastectomy w/ sentinel node biopsy Right 12/13/2013    Procedure: RIGHT TOTAL MASTECTOMY WITH SENTINEL LYMPH NODE BIOPSY;  Surgeon: Adin Hector, MD;  Location: [place];  Service: General;  Laterality: Right;  . Portacath placement Right 12/13/2013    Procedure: INSERTION PORT-A-CATH;  Surgeon: Adin Hector, MD;  Location: [place];  Service: General;  Laterality: Right;  . Breast reconstruction with placement of tissue expander and flex hd (acellular hydrated dermis) Right 12/13/2013    Procedure: RIGHT BREAST RECONSTRUCTION WITH PLACEMENT OF TISSUE EXPANDER AND FLEX HD (ACELLULAR HYDRATED DERMIS);  Surgeon: Irene Limbo, MD;  Location: [place];  Service:  Plastics;  Laterality: Right;  . Lipoma excision Bilateral 12/13/2013    Procedure: EXCISION OF LEFT BREAST KELOID AND RIGHT CHEST KELOID;  Surgeon: Irene Limbo, MD;  Location: [place];  Service: Plastics;  Laterality: Bilateral;  . Mastectomy    . Cholecystectomy  1970's  . Vaginal hysterectomy  1980    no salpingo-oophoretomu  . Breast biopsy Right 10/2013  . Tissue expander placement Right 01/10/2014    Procedure: Incision and Drainage Right Breast Seroma with TISSUE EXPANDER exchange;  Surgeon: Irene Limbo, MD;  Location: [place];  Service: Plastics;  Laterality: Right;  . Removal of bilateral tissue expanders with placement of bilateral breast implants Bilateral 09/26/2014    Procedure: REMOVAL OF RIGHT TISSUE EXPANDER WITH PLACEMENT OF RIGHT BREAST IMPLANT LIPOFILLING TO RIGHT BREAST/LEFT BREAST MASTOPEXY FOR SYMMETRY;  Surgeon: Irene Limbo, MD;  Location: [place];  Service: Plastics;  Laterality: Bilateral;  . Liposuction with lipofilling Right 09/26/2014    Procedure: LIPOSUCTION WITH LIPOFILLING;  Surgeon: Irene Limbo, MD;  Location: [place];  Service: Plastics;  Laterality: Right;  . Mastopexy Left 09/26/2014    Procedure: MASTOPEXY;  Surgeon: Irene Limbo, MD;  Location: [place];  Service: Plastics;  Laterality: Left;  . Breast reconstruction Right 12/12/2014    Procedure: REVISION OF RIGHT RECONSTRUCTIVE BREAST WITH CAPSULORRHAPHY PLACEMENT OF SILCONE IMPLANTS AND  LIPOFILLING TO RIGHT CHEST ;  Surgeon: Irene Limbo, MD;  Location: [place];  Service: Plastics;  Laterality: Right;  . Liposuction with lipofilling Right 12/12/2014    Procedure: LIPOSUCTION WITH LIPOFILLING;  Surgeon: Irene Limbo, MD;  Location: [place];  Service: Plastics;  Laterality: Right;    There were no vitals filed for this visit.  Visit Diagnosis:  Stiffness of joint, shoulder  region, right  Right upper quadrant pain  Balance problem  Physical deconditioning  Lymphedema syndrome, postmastectomy      Subjective Assessment - 02/13/15 1026    Subjective Been doing the exercises I got last time and I know I did them right because I was stiff after doing them! But Ikept doing them and today my bil shoulders just feel stiff but I think its just because I've been sitting in the cold hospital with my husband, he's in ICU now.    Currently in Pain? No/denies                  Angela Gross - 02/13/15 0001    Open a tight or new jar Moderate difficulty   Do heavy household chores (wash walls, wash floors) Mild difficulty   Carry a shopping bag or briefcase Mild difficulty   Wash your back Moderate difficulty   Use a knife to cut food No difficulty   Recreational activities in which you take some force or impact through your arm, shoulder, or hand (golf, hammering, tennis) Moderate difficulty   During the past week, to what extent has your arm, shoulder or hand problem interfered with your normal social activities with family, friends, neighbors, or groups? Slightly   During the past week, to what extent has your arm, shoulder or hand problem limited your work or other regular daily activities Slightly   Arm, shoulder, or hand pain. Mild   Tingling (pins and needles) in your arm, shoulder, or hand Mild   Difficulty Sleeping Mild difficulty   DASH Score 29.55 %               [facility] Adult PT Treatment/Exercise - 02/13/15 0001    Knee/Hip Exercises: Aerobic   Stationary Bike Level 2 for 2:30 mins, then level 2 remaining 2:30 mins for 5 mins total  Pt reported increase LE fatigue so stopped after 5 mins   Knee/Hip Exercises: Standing   Heel Raises 2 sets;10 reps   Other Standing Knee Exercises Bil hip 3 way raises (marching, abduction and extension) all 10 reps each, pt returned good demo.   Knee/Hip Exercises: Seated   Long Arc Quad  AROM;Strengthening;Both;10 reps  5 second holds   Manual Therapy   Manual Lymphatic Drainage (MLD) In supine:  short neck, left axilla and anterior inter-axillary anastomosis, right groin and axillo-inguinal anastomosis and Rt UE from dorsal hand to lateral shoulder                   Short Term Clinic Goals - 02/09/15 1001    CC Short Term Goal  #1   Title Patient will be independent in a basic  home program for range of motion and strength   Status Achieved   CC Short Term Goal  #2   Title Patient will verbalize understanding of lymphedema risk reduction practices   Status On-going   CC Short Term Goal  #3   Title Patient will report a decrease in pain by 25% so they can perform daily activities with greater  ease   Status Achieved             Long Term Clinic Goals - 02/13/15 1029    CC Long Term Goal  #1   Title Patient will be independent in a advanced home program for range of motion and strength    Status Partially Met   CC Long Term Goal  #2   Title Patient will report a decrease in pain by 50% so they can perform daily activities with greater ease  Pt reports 50% improvement thus far 02/13/15   Status Achieved   CC Long Term Goal  #3   Title Patient will decrease the DASH score to <25    to demonstrate increased functional use of upper extremity  29.55 for 02/13/15            Plan - 02/13/15 1054    Clinical Impression Statement Pt was a little more fatigued today than usual and she thinks its because she's been sitting so much lately at the hospital with her husband. Going to a meeting for the [employer] class this Friday and it will start in June. Progress towards DASH goal, has improved to ~29.   Pt will benefit from skilled therapeutic intervention in order to improve on the following deficits Decreased strength;Decreased knowledge of precautions;Pain;Increased edema;Decreased range of motion;Decreased activity tolerance;Impaired perceived functional  ability;Decreased endurance   Rehab Potential Good   Clinical Impairments Affecting Rehab Potential RA, raynaud's disease, thinning bones and fibromyalia    PT Frequency 2x / week   PT Duration 6 weeks   PT Treatment/Interventions Balance training;Functional mobility training;Neuromuscular re-education;Manual lymph drainage;Patient/family education   PT Next Visit Plan Continue balance exercises and LE strength and try adding 1 lb ankle weights if pt is less fatigued next visit; Continue manual lymph drainage.   Consulted and Agree with Plan of Care Patient        Problem List Patient Active Problem List   Diagnosis Date Noted  . Constipation 10/04/2014  . Personal history of malignant neoplasm of breast 09/26/2014  . Arthralgia of multiple sites 06/27/2014  . Chemotherapy induced cardiomyopathy 05/11/2014  . Hot flashes, menopausal 04/04/2014  . Chemotherapy-induced neuropathy 03/14/2014  . Anemia, unspecified 03/14/2014  . Hypokalemia 03/01/2014  . Anxiety 01/30/2014  . Breast cancer of lower-inner quadrant of right female breast 10/25/2013  . C-REACTIVE PROTEIN, SERUM, ELEVATED 03/07/2010  . DIABETES MELLITUS, TYPE II 03/04/2010  . ARTHRALGIA 03/04/2010    Otelia Limes, PTA 02/13/2015, 11:14 AM  [place], [place], [address] Phone: [phone]   Fax:  [phone]

## 2015-02-15 ENCOUNTER — Ambulatory Visit: Payer: Medicare HMO

## 2015-02-15 DIAGNOSIS — R2689 Other abnormalities of gait and mobility: Secondary | ICD-10-CM

## 2015-02-15 DIAGNOSIS — I972 Postmastectomy lymphedema syndrome: Secondary | ICD-10-CM

## 2015-02-15 DIAGNOSIS — M25611 Stiffness of right shoulder, not elsewhere classified: Secondary | ICD-10-CM

## 2015-02-15 DIAGNOSIS — R1011 Right upper quadrant pain: Secondary | ICD-10-CM

## 2015-02-15 DIAGNOSIS — R5381 Other malaise: Secondary | ICD-10-CM

## 2015-02-15 NOTE — Patient Instructions (Signed)
Scapular Retraction: Bilateral   Place band around doorknob and close door. In standing, face door, pull arms back, bringing shoulder blades together. Repeat _10-20___ times per set. Do __1-2__ sets per session. Do _1___ sessions per day.  http://orth.exer.[email or web address]   In Standing for 3 way raises:  All 20 reps, 3x/week with 1 lb lifting only to shoulder height.  Lift arms out in front Then wider in a "V" Lastly all the way out to side in a "T"   Copyright  V[employer]. All rights reserved.

## 2015-02-15 NOTE — Therapy (Signed)
[place], [place], [address] Phone: [phone]   Fax:  [phone]  Physical Therapy Treatment  Patient Details  Name: Angela Gross MRN: [id number] Date of Birth: 05-03-1951 Referring Provider:  Willey Blade, MD  Encounter Date: 02/15/2015      PT End of Session - 02/15/15 1449    Visit Number 9   Number of Visits 17   Date for PT Re-Evaluation 03/21/15   PT Start Time 3818   PT Stop Time 1443   PT Time Calculation (min) 49 min      Past Medical History  Diagnosis Date  . Raynaud disease   . Osteoporosis   . Hypertension   . PONV (postoperative nausea and vomiting)   . Wears glasses   . Full dentures   . Fibromyalgia   . Rheumatoid arthritis     "elbows; fingers; toes"  . Osteoarthritis of both knees   . Degenerative disc disease, cervical   . Chronic left shoulder pain     "work related injury" (01/10/2014)  . Breast cancer     "right" (01/10/2014)  . Type II diabetes mellitus     has not taken meds in over a month (01/10/2014)  . Syncope and collapse     "4 times in the last year" (01/10/2014)    Past Surgical History  Procedure Laterality Date  . Colonoscopy    . Mastectomy w/ sentinel node biopsy Right 12/13/2013    Procedure: RIGHT TOTAL MASTECTOMY WITH SENTINEL LYMPH NODE BIOPSY;  Surgeon: Adin Hector, MD;  Location: [facility];  Service: General;  Laterality: Right;  . Portacath placement Right 12/13/2013    Procedure: INSERTION PORT-A-CATH;  Surgeon: Adin Hector, MD;  Location: [facility];  Service: General;  Laterality: Right;  . Breast reconstruction with placement of tissue expander and flex hd (acellular hydrated dermis) Right 12/13/2013    Procedure: RIGHT BREAST RECONSTRUCTION WITH PLACEMENT OF TISSUE EXPANDER AND FLEX HD (ACELLULAR HYDRATED DERMIS);  Surgeon: Irene Limbo, MD;  Location: [facility];  Service:  Plastics;  Laterality: Right;  . Lipoma excision Bilateral 12/13/2013    Procedure: EXCISION OF LEFT BREAST KELOID AND RIGHT CHEST KELOID;  Surgeon: Irene Limbo, MD;  Location: [facility];  Service: Plastics;  Laterality: Bilateral;  . Mastectomy    . Cholecystectomy  1970's  . Vaginal hysterectomy  1980    no salpingo-oophoretomu  . Breast biopsy Right 10/2013  . Tissue expander placement Right 01/10/2014    Procedure: Incision and Drainage Right Breast Seroma with TISSUE EXPANDER exchange;  Surgeon: Irene Limbo, MD;  Location: [facility];  Service: Plastics;  Laterality: Right;  . Removal of bilateral tissue expanders with placement of bilateral breast implants Bilateral 09/26/2014    Procedure: REMOVAL OF RIGHT TISSUE EXPANDER WITH PLACEMENT OF RIGHT BREAST IMPLANT LIPOFILLING TO RIGHT BREAST/LEFT BREAST MASTOPEXY FOR SYMMETRY;  Surgeon: Irene Limbo, MD;  Location: [facility];  Service: Plastics;  Laterality: Bilateral;  . Liposuction with lipofilling Right 09/26/2014    Procedure: LIPOSUCTION WITH LIPOFILLING;  Surgeon: Irene Limbo, MD;  Location: [facility];  Service: Plastics;  Laterality: Right;  . Mastopexy Left 09/26/2014    Procedure: MASTOPEXY;  Surgeon: Irene Limbo, MD;  Location: [facility];  Service: Plastics;  Laterality: Left;  . Breast reconstruction Right 12/12/2014    Procedure: REVISION OF RIGHT RECONSTRUCTIVE BREAST WITH CAPSULORRHAPHY PLACEMENT OF SILCONE IMPLANTS AND  LIPOFILLING TO RIGHT CHEST ;  Surgeon: Irene Limbo, MD;  Location: [facility];  Service: Plastics;  Laterality: Right;  . Liposuction with lipofilling Right 12/12/2014    Procedure: LIPOSUCTION WITH LIPOFILLING;  Surgeon: Irene Limbo, MD;  Location: [facility];  Service: Plastics;  Laterality: Right;    There were no vitals filed for this visit.  Visit Diagnosis:  Stiffness of joint, shoulder  region, right  Right upper quadrant pain  Balance problem  Physical deconditioning  Lymphedema syndrome, postmastectomy      Subjective Assessment - 02/15/15 1415    Subjective Doing really good, will start [facility] at the [facility] next month, and doing my exercises at home. Feel ready for discharge.    Currently in Pain? No/denies                         [facility] Adult PT Treatment/Exercise - 02/15/15 0001    Knee/Hip Exercises: Aerobic   Stationary Bike Level 2 x5 mins  Pt continues with fatigue today, though not as much as last    Knee/Hip Exercises: Seated   Other Seated Knee Exercises Bil hamstring and piriformis figure 4 stretches 3 reps 10 sec holds, then standing at wall for Runners calf stretch 3 reps 10 sec holds   Shoulder Exercises: Standing   Retraction AROM;Strengthening;Both;10 reps;Theraband  2 sets   Theraband Level (Shoulder Retraction) Level 3 (Green)   Other Standing Exercises Bil UE 3 way raises 1 lb all 10 reps each (flexion, scaption and abduction to 90 degrees)   Shoulder Exercises: Pulleys   Flexion 2 minutes   ABduction 2 minutes   Manual Therapy   Manual Lymphatic Drainage (MLD) In supine:  short neck, left axilla and anterior inter-axillary anastomosis, right groin and axillo-inguinal anastomosis and Rt UE from dorsal hand to lateral shoulder   Other Manual Therapy Issued TG medium soft for pt to wear on her Rt UE with exercise until she can get a compression sleeve as pt reports sometimes she feels a little tightness in her upper arm.                 PT Education - 02/15/15 1413    Education provided Yes   Education Details Postural and bil UE strength   Person(s) Educated Patient   Methods Explanation;Demonstration;Handout   Comprehension Verbalized understanding;Returned demonstration           Short Term Clinic Goals - 02/09/15 1001    CC Short Term Goal  #1   Title Patient will be independent in a basic  home  program for range of motion and strength   Status Achieved   CC Short Term Goal  #2   Title Patient will verbalize understanding of lymphedema risk reduction practices   Status On-going   CC Short Term Goal  #3   Title Patient will report a decrease in pain by 25% so they can perform daily activities with greater ease   Status Achieved             [facility] Goals - 02/15/15 1401    CC Long Term Goal  #1   Title Patient will be independent in a advanced home program for range of motion and strength    Status Achieved   CC Long Term Goal  #3   Title Patient will decrease the DASH score to <25    to demonstrate increased functional use of upper extremity  Status Partially Met            Plan - 02/15/15 1450    Clinical Impression Statement Pt still presents with fatigue of her bil LE's though overall she reports this has improved and feels it has only gotten worse lately because she has been sitting so much at the hospital with her husband. Instructed pt to go for a short walk every 1-2 hours when she is there that long and stretch in the chair and she verbalized understanding and just hadnt thought to do that. She will be beginning [facility] at the [facility] next month and is compliant with her HEP; also has met all but 1 goal.    Pt will benefit from skilled therapeutic intervention in order to improve on the following deficits Decreased strength;Decreased knowledge of precautions;Pain;Increased edema;Decreased range of motion;Decreased activity tolerance;Impaired perceived functional ability;Decreased endurance   Rehab Potential Good   Clinical Impairments Affecting Rehab Potential RA, raynaud's disease, thinning bones and fibromyalia    PT Frequency 2x / week   PT Duration 6 weeks   PT Treatment/Interventions Balance training;Functional mobility training;Neuromuscular re-education;Manual lymph drainage;Patient/family education   PT Next Visit Plan Discharge visit.     Consulted and Agree with Plan of Care Patient        Problem List Patient Active Problem List   Diagnosis Date Noted  . Constipation 10/04/2014  . Personal history of malignant neoplasm of breast 09/26/2014  . Arthralgia of multiple sites 06/27/2014  . Chemotherapy induced cardiomyopathy 05/11/2014  . Hot flashes, menopausal 04/04/2014  . Chemotherapy-induced neuropathy 03/14/2014  . Anemia, unspecified 03/14/2014  . Hypokalemia 03/01/2014  . Anxiety 01/30/2014  . Breast cancer of lower-inner quadrant of right female breast 10/25/2013  . C-REACTIVE PROTEIN, SERUM, ELEVATED 03/07/2010  . DIABETES MELLITUS, TYPE II 03/04/2010  . ARTHRALGIA 03/04/2010   . Otelia Limes, PTA 02/15/2015, 2:57 PM   PHYSICAL THERAPY DISCHARGE SUMMARY  Visits from Start of Care: 9  Current functional level related to goals / functional outcomes: Pt continues with fatigue, but has to stop PT as she is spending her time with her ill husband.     Remaining deficits: Fatigue, generalized weakness   Education / Equipment: Exercise, lymphededma risk reduction Plan: Patient agrees to discharge.  Patient goals were partially met. Patient is being discharged due to lack of progress.  ?????       Maudry Diego, PT 02/16/2015 1:05 PM   [place], [place], [address] Phone: [phone]   Fax:  [phone]

## 2015-02-21 ENCOUNTER — Ambulatory Visit
Admission: RE | Admit: 2015-02-21 | Discharge: 2015-02-21 | Disposition: A | Discharge status: Home / Self Care | Payer: Medicare HMO | Source: Ambulatory Visit | Attending: General Surgery | Admitting: General Surgery

## 2015-02-21 ENCOUNTER — Ambulatory Visit: Payer: Medicare HMO

## 2015-02-21 ENCOUNTER — Ambulatory Visit: Admission: RE | Admit: 2015-02-21 | Payer: Medicare HMO | Source: Ambulatory Visit

## 2015-02-27 ENCOUNTER — Ambulatory Visit (HOSPITAL_BASED_OUTPATIENT_CLINIC_OR_DEPARTMENT_OTHER): Payer: Medicare HMO | Admitting: Oncology

## 2015-02-27 ENCOUNTER — Ambulatory Visit: Payer: Medicare HMO

## 2015-02-27 ENCOUNTER — Ambulatory Visit (HOSPITAL_BASED_OUTPATIENT_CLINIC_OR_DEPARTMENT_OTHER): Payer: Medicare HMO

## 2015-02-27 ENCOUNTER — Telehealth: Payer: Self-pay | Admitting: Oncology

## 2015-02-27 ENCOUNTER — Other Ambulatory Visit (HOSPITAL_BASED_OUTPATIENT_CLINIC_OR_DEPARTMENT_OTHER): Payer: Medicare HMO

## 2015-02-27 VITALS — BP 139/95 | HR 90 | Temp 98.2°F | Resp 18 | Ht 65.0 in | Wt 166.5 lb

## 2015-02-27 DIAGNOSIS — M069 Rheumatoid arthritis, unspecified: Secondary | ICD-10-CM | POA: Diagnosis not present

## 2015-02-27 DIAGNOSIS — C50311 Malignant neoplasm of lower-inner quadrant of right female breast: Secondary | ICD-10-CM

## 2015-02-27 DIAGNOSIS — G620 Drug-induced polyneuropathy: Secondary | ICD-10-CM | POA: Diagnosis not present

## 2015-02-27 DIAGNOSIS — C50811 Malignant neoplasm of overlapping sites of right female breast: Secondary | ICD-10-CM

## 2015-02-27 DIAGNOSIS — Z5112 Encounter for antineoplastic immunotherapy: Secondary | ICD-10-CM

## 2015-02-27 DIAGNOSIS — M810 Age-related osteoporosis without current pathological fracture: Secondary | ICD-10-CM | POA: Diagnosis not present

## 2015-02-27 DIAGNOSIS — M85851 Other specified disorders of bone density and structure, right thigh: Secondary | ICD-10-CM | POA: Insufficient documentation

## 2015-02-27 DIAGNOSIS — Z95828 Presence of other vascular implants and grafts: Secondary | ICD-10-CM

## 2015-02-27 LAB — CBC WITH DIFFERENTIAL/PLATELET
BASO%: 0.4 % (ref 0.0–2.0)
BASOS ABS: 0 10*3/uL (ref 0.0–0.1)
EOS%: 1.6 % (ref 0.0–7.0)
Eosinophils Absolute: 0.1 10*3/uL (ref 0.0–0.5)
HCT: 38.1 % (ref 34.8–46.6)
HEMOGLOBIN: 12.3 g/dL (ref 11.6–15.9)
LYMPH#: 2.2 10*3/uL (ref 0.9–3.3)
LYMPH%: 27.4 % (ref 14.0–49.7)
MCH: 26.2 pg (ref 25.1–34.0)
MCHC: 32.3 g/dL (ref 31.5–36.0)
MCV: 81.1 fL (ref 79.5–101.0)
MONO#: 0.4 10*3/uL (ref 0.1–0.9)
MONO%: 5.3 % (ref 0.0–14.0)
NEUT#: 5.3 10*3/uL (ref 1.5–6.5)
NEUT%: 65.3 % (ref 38.4–76.8)
Platelets: 177 10*3/uL (ref 145–400)
RBC: 4.7 10*6/uL (ref 3.70–5.45)
RDW: 15 % — AB (ref 11.2–14.5)
WBC: 8.1 10*3/uL (ref 3.9–10.3)

## 2015-02-27 LAB — COMPREHENSIVE METABOLIC PANEL (CC13)
ALBUMIN: 3.2 g/dL — AB (ref 3.5–5.0)
ALT: 11 U/L (ref 0–55)
AST: 16 U/L (ref 5–34)
Alkaline Phosphatase: 119 U/L (ref 40–150)
Anion Gap: 11 mEq/L (ref 3–11)
BUN: 10.1 mg/dL (ref 7.0–26.0)
CALCIUM: 8.9 mg/dL (ref 8.4–10.4)
CHLORIDE: 105 meq/L (ref 98–109)
CO2: 25 meq/L (ref 22–29)
Creatinine: 1 mg/dL (ref 0.6–1.1)
EGFR: 68 mL/min/{1.73_m2} — ABNORMAL LOW (ref >90)
GLUCOSE: 179 mg/dL — AB (ref 70–140)
POTASSIUM: 3.6 meq/L (ref 3.5–5.1)
Sodium: 141 mEq/L (ref 136–145)
TOTAL PROTEIN: 6.8 g/dL (ref 6.4–8.3)
Total Bilirubin: 0.38 mg/dL (ref 0.20–1.20)

## 2015-02-27 MED ORDER — TRASTUZUMAB CHEMO INJECTION 440 MG
6.0000 mg/kg | Freq: Once | INTRAVENOUS | Status: AC
  Administered 2015-02-27: 420 mg via INTRAVENOUS
  Filled 2015-02-27: qty 20

## 2015-02-27 MED ORDER — DIPHENHYDRAMINE HCL 25 MG PO CAPS
ORAL_CAPSULE | ORAL | Status: AC
  Filled 2015-02-27: qty 1

## 2015-02-27 MED ORDER — SODIUM CHLORIDE 0.9 % IV SOLN
Freq: Once | INTRAVENOUS | Status: AC
  Administered 2015-02-27: 11:00:00 via INTRAVENOUS

## 2015-02-27 MED ORDER — ACETAMINOPHEN 325 MG PO TABS
ORAL_TABLET | ORAL | Status: AC
  Filled 2015-02-27: qty 2

## 2015-02-27 MED ORDER — SODIUM CHLORIDE 0.9 % IJ SOLN
10.0000 mL | INTRAMUSCULAR | Status: DC | PRN
  Administered 2015-02-27: 10 mL
  Filled 2015-02-27: qty 10

## 2015-02-27 MED ORDER — ACETAMINOPHEN 325 MG PO TABS
650.0000 mg | ORAL_TABLET | Freq: Once | ORAL | Status: AC
  Administered 2015-02-27: 650 mg via ORAL

## 2015-02-27 MED ORDER — DIPHENHYDRAMINE HCL 25 MG PO CAPS
25.0000 mg | ORAL_CAPSULE | Freq: Once | ORAL | Status: AC
  Administered 2015-02-27: 25 mg via ORAL

## 2015-02-27 MED ORDER — SODIUM CHLORIDE 0.9 % IJ SOLN
10.0000 mL | INTRAMUSCULAR | Status: DC | PRN
  Administered 2015-02-27: 10 mL via INTRAVENOUS
  Filled 2015-02-27: qty 10

## 2015-02-27 MED ORDER — HEPARIN SOD (PORK) LOCK FLUSH 100 UNIT/ML IV SOLN
500.0000 [IU] | Freq: Once | INTRAVENOUS | Status: AC | PRN
  Administered 2015-02-27: 500 [IU]
  Filled 2015-02-27: qty 5

## 2015-02-27 NOTE — Telephone Encounter (Signed)
Gave avs & calendar for November. Spoke with [place] office and will receive order and contact the patient.

## 2015-02-27 NOTE — [facility] Notes (Signed)
[facility]  [phone] [phone]     ID: [name] OB: Mar 08, 1951  MR#: [id number]  RCB#:[id number]  PCP: Salena Saner., MD GYN:   SUFanny Skates OTHER MD: [name], Lyndee Leo Sanger, Arnoldo Hooker Thimmappa  CHIEF COMPLAINT:  Right Breast Cancer, triple positive CURRENT TREATMENT:  anastrozole  BREAST CANCER HISTORY: From the original intake note:  Vora had routine screening mammography at the breast center 09/21/2013 showing a possible mass in calcifications in the right breast. On 10/11/2013 she underwent right diagnostic mammography and ultrasonography. This confirmed a spiculated 1.2 cm mass in the central right breast, with a group of pleomorphic calcifications slightly laterally. The calcifications spanning 1.6 cm. A second group of calcifications extended inferiorly and spanned 8 mm. The entire area in aggregate measured 6.5 cm. By physical exam there was no palpable abnormality. Ultrasonography showed an irregular hypoechoic mass at the 6:00 position of the right breast measuring 1 cm maximally. The right axilla was normal.  Biopsy of the right breast mass in question as well as the more lateral area of calcifications on 10/11/2013 showed ([id number]) most to be positive for an invasive ductal carcinoma, both estrogen receptor 85-90% positive with strong staining intensity, both progesterone receptor negative, with an MIB-1 of 17%. HER-2 was amplified, with a signals ratio of 3.44 and a copy number per cell 04 0.30.  Bilateral breast MRI 10/19/2013 showed again a spiculated mass in the 6:00 region of the right breast measuring 1.2 cm, a slightly more lateral hematoma associated with a second biopsy and also with clumped enhancement, and asymmetric none masslike enhancement in most of the right breast, the entire area of abnormality measuring 9.0 cm. The left breast was unremarkable and there were no abnormal appearing lymph  nodes.  Her subsequent history is as detailed below   INTERVAL HISTORY: [name] returns for follow up of her breast cancer. She completes her trastuzumab treatments today. That is quite and achievement. She had her last echocardiogram in March and it was fine. She does not report any side effects from the trastuzumab. On the other hand she does have hot flashes and night sweats associated with the anastrozole. She has some arthralgias of course but that's going to be related to her history of rheumatoid arthritis. She will be seeing Dr. Trudie Reed soon to reestablish therapy for that, which was interrupted during the Herceptin.  REVIEW OF SYSTEMS: Arleen tells me her son was found to have a nonmalignant tumor of the spine requiring radiation. Her husband had renal failure following procedures for his severe atherosclerotic cardiovascular disease. This of course increases her sense of stress. She has gained some weight. She has some seasonal sinus problems. [facility] [place] [facility] and also [facility] walking on her own. Otherwise a detailed review of systems today was stable.   PAST MEDICAL HISTORY: Past Medical History  Diagnosis Date  . Raynaud disease   . Osteoporosis   . Hypertension   . PONV (postoperative nausea and vomiting)   . Wears glasses   . Full dentures   . Fibromyalgia   . Rheumatoid arthritis     "elbows; fingers; toes"  . Osteoarthritis of both knees   . Degenerative disc disease, cervical   . Chronic left shoulder pain     "work related injury" (01/10/2014)  . Breast cancer     "right" (01/10/2014)  . Type II diabetes mellitus     has not taken  meds in over a month (01/10/2014)  . Syncope and collapse     "4 times in the last year" (01/10/2014)    PAST SURGICAL HISTORY: Past Surgical History  Procedure Laterality Date  . Colonoscopy    . Mastectomy w/ sentinel node biopsy Right 12/13/2013    Procedure: RIGHT TOTAL MASTECTOMY WITH  SENTINEL LYMPH NODE BIOPSY;  Surgeon: Adin Hector, MD;  Location: [facility];  Service: General;  Laterality: Right;  . Portacath placement Right 12/13/2013    Procedure: INSERTION PORT-A-CATH;  Surgeon: Adin Hector, MD;  Location: [facility];  Service: General;  Laterality: Right;  . Breast reconstruction with placement of tissue expander and flex hd (acellular hydrated dermis) Right 12/13/2013    Procedure: RIGHT BREAST RECONSTRUCTION WITH PLACEMENT OF TISSUE EXPANDER AND FLEX HD (ACELLULAR HYDRATED DERMIS);  Surgeon: Irene Limbo, MD;  Location: [facility];  Service: Plastics;  Laterality: Right;  . Lipoma excision Bilateral 12/13/2013    Procedure: EXCISION OF LEFT BREAST KELOID AND RIGHT CHEST KELOID;  Surgeon: Irene Limbo, MD;  Location: [facility];  Service: Plastics;  Laterality: Bilateral;  . Mastectomy    . Cholecystectomy  1970's  . Vaginal hysterectomy  1980    no salpingo-oophoretomu  . Breast biopsy Right 10/2013  . Tissue expander placement Right 01/10/2014    Procedure: Incision and Drainage Right Breast Seroma with TISSUE EXPANDER exchange;  Surgeon: Irene Limbo, MD;  Location: [facility];  Service: Plastics;  Laterality: Right;  . Removal of bilateral tissue expanders with placement of bilateral breast implants Bilateral 09/26/2014    Procedure: REMOVAL OF RIGHT TISSUE EXPANDER WITH PLACEMENT OF RIGHT BREAST IMPLANT LIPOFILLING TO RIGHT BREAST/LEFT BREAST MASTOPEXY FOR SYMMETRY;  Surgeon: Irene Limbo, MD;  Location: [facility];  Service: Plastics;  Laterality: Bilateral;  . Liposuction with lipofilling Right 09/26/2014    Procedure: LIPOSUCTION WITH LIPOFILLING;  Surgeon: Irene Limbo, MD;  Location: [facility];  Service: Plastics;  Laterality: Right;  . Mastopexy Left 09/26/2014    Procedure: MASTOPEXY;  Surgeon: Irene Limbo, MD;  Location: [facility];  Service: Plastics;  Laterality: Left;  . Breast reconstruction Right 12/12/2014    Procedure: REVISION OF RIGHT RECONSTRUCTIVE BREAST WITH CAPSULORRHAPHY PLACEMENT OF SILCONE IMPLANTS AND LIPOFILLING TO RIGHT CHEST ;  Surgeon: Irene Limbo, MD;  Location: [facility];  Service: Plastics;  Laterality: Right;  . Liposuction with lipofilling Right 12/12/2014    Procedure: LIPOSUCTION WITH LIPOFILLING;  Surgeon: Irene Limbo, MD;  Location: [facility];  Service: Plastics;  Laterality: Right;    FAMILY HISTORY No family history on file. The patient is little information about her father. Her mother died from a heart attack at the age of 37. She had been diagnosed with breast cancer in her late 82s. The patient had 4 brothers and 4 sisters. There is no other history of breast or ovarian cancer in the family to her knowledge.  GYNECOLOGIC HISTORY:    (Reviewed 04/04/2014) She does not recall her age of menarche. She underwent hysterectomy in her 51s. She did not receive hormone replacement. First live birth at 62. She was GX P2.  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 She is currently disabled secondary to her rheumatoid arthritis. Her husband Solei Wubben is a retired Administrator. He now works part time at [employer].  Son [name] works in [place] as a Patent attorney. Daughter [name] is a Social worker.    ADVANCED DIRECTIVES: Not in place   HEALTH MAINTENANCE: (Updated 04/04/2014) History  Substance Use Topics  . Smoking status: Never Smoker   . Smokeless tobacco: Never Used  . Alcohol Use: No     Colonoscopy: Not on file  PAP: Status post remote hysterectomy  Bone density: At [facility] 01/05/2009 showed osteopenia with a T score of -1.7  Lipid panel: Not on file   Allergies  Allergen Reactions  . Codeine Other (See  Comments) and Hives    Altered mental status, numbness  Altered mental status, Numbess  . Diazepam     Other reaction(s): Delusions (intolerance) hallucinations     Current Outpatient Prescriptions  Medication Sig Dispense Refill  . anastrozole (ARIMIDEX) 1 MG tablet Take 1 tablet (1 mg total) by mouth daily. 30 tablet 12  . carvedilol (COREG) 12.5 MG tablet Take 1 tablet (12.5 mg total) by mouth 2 (two) times daily with a meal. 60 tablet 3  . cephALEXin (KEFLEX) 500 MG capsule Take 1 capsule (500 mg total) by mouth 3 (three) times daily. 30 capsule 0  . cyclobenzaprine (FLEXERIL) 10 MG tablet Take 1 tablet (10 mg total) by mouth 3 (three) times daily as needed for muscle spasms. 30 tablet 1  . gabapentin (NEURONTIN) 300 MG capsule Take 1 capsule (300 mg total) by mouth 3 (three) times daily. For neuropathy and hot flashes 270 capsule 1  . lidocaine-prilocaine (EMLA) cream Apply 1 application topically as needed. 1-2 hrs before port access 30 g 4  . lisinopril (PRINIVIL,ZESTRIL) 10 MG tablet Take 1 tablet (10 mg total) by mouth daily. 90 tablet 3  . oxyCODONE (OXY IR/ROXICODONE) 5 MG immediate release tablet Take 1 tablet (5 mg total) by mouth every 4 (four) hours as needed for severe pain. 50 tablet 0  . traMADol (ULTRAM) 50 MG tablet Take 1 tablet (50 mg total) by mouth 3 (three) times daily as needed (pain). 90 tablet 1   No current facility-administered medications for this visit.    OBJECTIVE:  Middle-aged African-American woman in no acute distress Filed Vitals:   02/27/15 1036  BP: 139/95  Pulse: 90  Temp: 98.2 F (36.8 C)  Resp: 18     Body mass index is 27.71 kg/(m^2).    ECOG FS: 0  Filed Weights   02/27/15 1036  Weight: 166 lb 8 oz (75.524 kg)   Sclerae unicteric, pupils round and equal Oropharynx clear, full upper and lower plates No cervical or supraclavicular adenopathy Lungs no rales or rhonchi Heart regular rate and rhythm Abd soft, nontender, positive bowel  sounds MSK no focal spinal tenderness, no upper extremity lymphedema Neuro: nonfocal, well oriented, positive affect Breasts: The right breast is status post mastectomy with implant in place. There is no evidence of local recurrence. The right axilla is benign per the left breast is unremarkable  LAB RESULTS:   Lab Results  Component Value Date   WBC 8.1 02/27/2015   NEUTROABS 5.3 02/27/2015   HGB 12.3 02/27/2015   HCT 38.1 02/27/2015   MCV 81.1 02/27/2015   PLT 177 02/27/2015      Chemistry      Component Value Date/Time   NA 142 02/06/2015 0949   NA 138 01/09/2014 1522   K 3.8 02/06/2015 0949   K 4.2 01/09/2014 1522   CL 100 01/09/2014 1522   CO2 23 02/06/2015  0949   CO2 25 01/09/2014 1522   BUN 16.3 02/06/2015 0949   BUN 11 01/09/2014 1522   CREATININE 1.1 02/06/2015 0949   CREATININE 0.99 01/09/2014 1522      Component Value Date/Time   CALCIUM 9.0 02/06/2015 0949   CALCIUM 8.5 01/09/2014 1522   ALKPHOS 122 02/06/2015 0949   ALKPHOS 102 12/09/2013 1400   AST 19 02/06/2015 0949   AST 36 12/09/2013 1400   ALT 14 02/06/2015 0949   ALT 30 12/09/2013 1400   BILITOT 0.26 02/06/2015 0949   BILITOT 0.2* 12/09/2013 1400     STUDIES: Mm Digital Screening Unilat L  02/21/2015   CLINICAL DATA:  Screening.  EXAM: DIGITAL SCREENING UNILATERAL LEFT MAMMOGRAM WITH CAD  COMPARISON:  Previous exam(s).  ACR Breast Density Category b: There are scattered areas of fibroglandular density.  FINDINGS: The patient has had a right mastectomy. There are no findings suspicious for malignancy. Images were processed with CAD.  IMPRESSION: No mammographic evidence of malignancy. A result letter of this screening mammogram will be mailed directly to the patient.  RECOMMENDATION: Screening mammogram in one year. (Code:SM-B-01Y)  BI-RADS CATEGORY  1: Negative.   Electronically Signed   By: Altamese Cabal M.D.   On: 02/21/2015 14:24     ASSESSMENT: 64 y.o. [place] woman   (1)  s/p biopsy  of separate Right breast masses 10/11/2013 for a clinical mT1 N0, stage IA invasive ductal carcinoma, grade II-III, one of the mass is being 85% estrogen receptor positive, progesterone receptor negative, with an MIB-1 of 17% and HER-2 amplified.  (2) status post right mastectomy and sentinel lymph node sampling with immediate expander placement 12/13/2013 for a pT1c pN0, stage IA invasive ductal carcinoma, grade 3, again HER-2 positive.  (3) treated in the adjuvant setting with carboplatin, docetaxel, trastuzumab and pertuzumab, first dose on 02/21/2014   (a). Patient developed significant peripheral neuropathy after one cycle of chemotherapy, and both carboplatin and docetaxel were held beginning with cycle 2. Continued pertuzumab/ trastuzumab x 5 more cycles, completed 08/01/2014  (b)  single agent trastuzumab to be started 08/29/2014, to be continued every 3 weeks until May 2016  (d) most recent echocardiogram 01/08/2015 showed a well preserved ejection fraction  (4) started on anastrozole on 04/04/2014 .   (a) DEXA scan favor 2720 15 shows osteoporosis, with the lowest T score at -2.49  (b) denosumab/Proluia to start 03/06/2015, repeated every 6 months while on anastrozole     (5) Rheumatoid arthritis,  remicade and lyrica held until completion of trastuzumab. Controlled with oxycodone PRN  (6) chemotherapy-induced peripheral neuropathy  - ongabapentin, 300 BID, and tramadol, 50 mg every 6 hours as needed.    (7)  Implant reconstruction  right breast3/1/16   PLAN: Ofilia completes one year of trastuzumab/anti-HER-2 immunotherapy today. She has shown no evidence of cardiac dysfunction. She will be scheduled for one final echo [facility]. She is now ready to have her port removed.  We discussed osteoporosis and I gave her a copy of the bone scan she had last year. We discussed the possibility of alendronate, ibandronate, zolendronate, or denosumab. After much  discussion she opted to 4 denosumab and she will have her first Prolia injection next week. We will repeat that every 6 months while she is on anastrozole. She will be due for repeat bone density February of next year  We are going to start seeing her on an every 6 month basis at this point. She will  be contacting Dr. Lenna Gilford office directly to get reestablished on her rheumatoid arthritis treatment.  Tulsi understands her prognosis is good. She will call with any problems that may develop before her next visit here.    Chauncey Cruel, MD  02/27/2015 10:42 AM

## 2015-02-27 NOTE — Patient Instructions (Signed)

## 2015-02-27 NOTE — Patient Instructions (Signed)
[facility] Discharge Instructions for Patients Receiving Chemotherapy  Today you received the following chemotherapy agents: Herceptin   To help prevent nausea and vomiting after your treatment, we encourage you to take your nausea medication as directed.    If you develop nausea and vomiting that is not controlled by your nausea medication, call the clinic.   BELOW ARE SYMPTOMS THAT SHOULD BE REPORTED IMMEDIATELY:  *FEVER GREATER THAN 100.5 F  *CHILLS WITH OR WITHOUT FEVER  NAUSEA AND VOMITING THAT IS NOT CONTROLLED WITH YOUR NAUSEA MEDICATION  *UNUSUAL SHORTNESS OF BREATH  *UNUSUAL BRUISING OR BLEEDING  TENDERNESS IN MOUTH AND THROAT WITH OR WITHOUT PRESENCE OF ULCERS  *URINARY PROBLEMS  *BOWEL PROBLEMS  UNUSUAL RASH Items with * indicate a potential emergency and should be followed up as soon as possible.  Feel free to call the clinic you have any questions or concerns. The clinic phone number is [phone].  Please show the CHEMO ALERT CARD at check-in to the Emergency Department and triage nurse.   

## 2015-02-28 ENCOUNTER — Encounter (HOSPITAL_BASED_OUTPATIENT_CLINIC_OR_DEPARTMENT_OTHER): Payer: Self-pay | Admitting: *Deleted

## 2015-02-28 NOTE — Progress Notes (Signed)
Called Patient form Pre op admission services for [facility] removal on 03-02-15 by Dr Dalbert Batman. Pt states that she is having breast reconstruction on 03-06-15 with Dr Iran Planas and was told by her that she would remove her port during that admission. Pt states that she does not want a separate surgery or anesthesia event for port removal. Dr Para Skeans office notified and stated that they would resolve things and let the patient know.

## 2015-02-28 NOTE — H&P (Signed)
Angela Gross  Location: [facility] Patient #: [id number] DOB: January 06, 1951 Married / Language: English / Race: Black or African American Female      History of Present Illness  The patient is a 64 year old female who presents with breast cancer. this patient returns for long-term followup regarding her right breast cancer. She underwent right total mastectomy, sentinel node biopsy, and Port-A-Cath insertion on 12/13/2013. She had immediate reconstruction with tissue expander and excision of several bulky keloids by Dr. Iran Planas. 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 Stage TI C., N0. She was told she does not need radiation therapy. In the interim she has struggled. She's had problems with her tissue expander and has had drains placed for seromas and now she's doing fairly well and contemplating exchange of the expander for a permanent implant. She's also struggled with her chemotherapy. She's had weight loss that has been hospitalized due to dehydration. She says she's gained her weight back. She also developed heart problems, has seen cardiology and is now on lisinopril and Coreg. She hasn't had any chemotherapy since July but plans to try again later . She knows that Dr. Jana Hakim wants to give her Herceptin for a year. she is frustrated. She has not had physical therapy but knows she will need that after the permanent implant.  She has completed chemotherapy and is referred back for port removal.   Other Problems  Anxiety Disorder Arthritis Breast Cancer High blood pressure Lump In Breast  Past Surgical History  Breast Biopsy Right. Breast Mass; Local Excision Right. Hemorrhoidectomy Hysterectomy (not due to cancer) - Partial Mastectomy Right.  Diagnostic Studies History  Colonoscopy 1-5 years ago Mammogram within last year Pap Smear 1-5 years  ago  Allergies Marjean Donna, CMA; 06/22/2014 10:03 AM) Codeine Sulfate *ANALGESICS - OPIOID*  Medication History  Gabapentin (300MG Capsule, Oral daily) Active. TraMADol HCl (50MG Tablet, Oral two times daily) Active. Coreg (6.25MG Tablet, Oral daily) Active. Lisinopril (10MG Tablet, Oral daily) Active. Anastrozole (1MG Tablet, Oral daily) Active. Flexeril (10MG Tablet, Oral as needed) Active.  Social History  Alcohol use Occasional alcohol use. Caffeine use Coffee. No drug use Tobacco use Never smoker.  Family History  Arthritis Mother, Sister. Breast Cancer Family Members In General, Mother. Diabetes Mellitus Mother, Sister. Heart Disease Mother. Heart disease in female family member before age 51 Hypertension Mother, Sister. Migraine Headache Daughter.  Pregnancy / Birth History  Age at menarche 41 years. Age of menopause 66-50 Gravida 2 Maternal age 62-25 Para 2  Review of Systems General Present- Fatigue and Night Sweats. Not Present- Appetite Loss, Chills, Fever, Weight Gain and Weight Loss. Skin Present- Dryness. Not Present- Change in Wart/Mole, Hives, Jaundice, New Lesions, Non-Healing Wounds, Rash and Ulcer. HEENT Present- Seasonal Allergies, Sinus Pain and Wears glasses/contact lenses. Not Present- Earache, Hearing Loss, Hoarseness, Nose Bleed, Oral Ulcers, Ringing in the Ears, Sore Throat, Visual Disturbances and Yellow Eyes. Cardiovascular Present- Leg Cramps and Swelling of Extremities. Not Present- Chest Pain, Difficulty Breathing Lying Down, Palpitations, Rapid Heart Rate and Shortness of Breath. Gastrointestinal Present- Constipation and Gets full quickly at meals. Not Present- Abdominal Pain, Bloating, Bloody Stool, Change in Bowel Habits, Chronic diarrhea, Difficulty Swallowing, Excessive gas, Hemorrhoids, Indigestion, Nausea, Rectal Pain and Vomiting. Musculoskeletal Present- Joint Pain, Joint Stiffness and Muscle Pain. Not Present-  Back Pain, Muscle Weakness and Swelling of Extremities. Neurological Present- Tingling. Not Present- Decreased Memory, Fainting, Headaches, Numbness, Seizures, Tremor, Trouble  walking and Weakness. Psychiatric Present- Anxiety. Not Present- Bipolar, Change in Sleep Pattern, Depression, Fearful and Frequent crying. Endocrine Present- Cold Intolerance and Hot flashes. Not Present- Excessive Hunger, Hair Changes, Heat Intolerance and New Diabetes. Hematology Present- Easy Bruising. Not Present- Excessive bleeding, Gland problems, HIV and Persistent Infections.   Vitals   Weight: 147 lb Height: 60in Body Surface Area: 1.68 m Body Mass Index: 28.71 kg/m Pulse: 72 (Regular)  BP: 118/70 (Sitting, Left Arm, Standard)    Physical Exam  General Note: alert. Pleasant. Cooperative. A little bit frustrated and tearful.   Head and Neck Note: neck reveals no adenopathy or mass.   Chest and Lung Exam Note: lungs are clear to auscultation bilaterally. No wheeze. No rhonchi.   Breast Note: right mastectomy incision well healed. The tissue expander in place. The side infection or seroma. Right axilla negative. Range of motion right shoulder is about 160. Abduction. Port in the right infraclavicular area looks normal. Left breast soft. No skin change. No mass. No adenopathy.   Cardiovascular Note: heart reveals regular rate and rhythm. No murmur. No ectopy.     Assessment & Plan  MALIGNANT NEOPLASM OF CENTRAL PORTION OF RIGHT FEMALE BREAST (174.1  C50.111) Impression: no evidence of recurrence 6 months postop right mastectomy and Port-A-Cath insertion.  She has completed chemotherapy. Dr. Jana Hakim has referred her back for port removal. Will schedule at her convenience in the near future.     Edsel Petrin. Dalbert Batman, M.D., [facility] General and Minimally invasive Surgery Breast and Colorectal Surgery Office:   [phone] Pager:    [phone]

## 2015-03-02 ENCOUNTER — Ambulatory Visit (HOSPITAL_BASED_OUTPATIENT_CLINIC_OR_DEPARTMENT_OTHER): Admission: RE | Admit: 2015-03-02 | Payer: Medicare HMO | Source: Ambulatory Visit | Admitting: General Surgery

## 2015-03-02 ENCOUNTER — Encounter (HOSPITAL_BASED_OUTPATIENT_CLINIC_OR_DEPARTMENT_OTHER): Admission: RE | Payer: Self-pay | Source: Ambulatory Visit

## 2015-03-02 HISTORY — DX: Anxiety disorder, unspecified: F41.9

## 2015-03-02 SURGERY — REMOVAL PORT-A-CATH
Anesthesia: Monitor Anesthesia Care

## 2015-03-04 ENCOUNTER — Other Ambulatory Visit: Payer: Self-pay | Admitting: Oncology

## 2015-03-05 ENCOUNTER — Telehealth: Payer: Self-pay | Admitting: Oncology

## 2015-03-05 NOTE — Telephone Encounter (Signed)
lvm for pt regarding to 5.24 appt.

## 2015-03-05 NOTE — H&P (Signed)
  Subjective:    Patient ID: Angela Gross is a 64 y.o. female.  Follow-up   2.5 months post op right capsulorraphy, lipofilling and exchange of silicone implant to smaller size. Happy with reduced size on right, notes resorption fat.   Path with negative nodes, 1.8 cm tumor. Er/PR+, Her 2 +. 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   History significant for RA and Raynauds. Angela Gross is her rheumatologist. Has held MTX and steroids throughout chemotherapy and no plans to start again.   Mastectomy wt 1194 g Left mastopexy wt 277 g  Review of Systems     Objective:   Physical Exam  Cardiovascular: Regular rhythm and normal heart sounds.  Pulmonary/Chest: Effort normal and breath sounds normal.  Abdominal: Soft.   some hypopigmentation left NAC Contour depression Right upper pole, some recurrent lateral displacement implant, left pseudoptosis/bottoming out SN to nipple L21 cm BW R 16 L 16 Nipple to IMF L 10 cm    Assessment:     R breast cancer , Herceptin complete Rheumatoid arthritis, Raynauds     Plan:    Plan repeat lipofilling to upper chest, removal port, and NAC reconstruction. We reviewed options for NAC creation including tattoo vs referral to tattoo artist vs local flap and FTSG. Latter would be procedure in OR, under anesthesia. Discussed bolster, may still desire tattoo to improve color of NAC. Reviewed risk hypopigmentation graft; in past had decised for skin graft for areola but with further discussion today will plan nipple creation alone with local flaps and plan tattoo bilateral areola once healed.    Mentor Siltex Ultra High Profile 650 ml implant.120 ml fat grafted to right breast        Irene Limbo, MD [facility] Plastic & Reconstructive Surgery [phone]

## 2015-03-06 ENCOUNTER — Encounter (HOSPITAL_BASED_OUTPATIENT_CLINIC_OR_DEPARTMENT_OTHER)
Admission: RE | Disposition: A | Discharge status: Home / Self Care | Payer: Self-pay | Source: Ambulatory Visit | Attending: Plastic Surgery

## 2015-03-06 ENCOUNTER — Ambulatory Visit (HOSPITAL_BASED_OUTPATIENT_CLINIC_OR_DEPARTMENT_OTHER): Payer: Medicare HMO | Admitting: Anesthesiology

## 2015-03-06 ENCOUNTER — Ambulatory Visit: Payer: Medicare HMO

## 2015-03-06 ENCOUNTER — Encounter (HOSPITAL_BASED_OUTPATIENT_CLINIC_OR_DEPARTMENT_OTHER): Payer: Self-pay | Admitting: *Deleted

## 2015-03-06 ENCOUNTER — Ambulatory Visit (HOSPITAL_BASED_OUTPATIENT_CLINIC_OR_DEPARTMENT_OTHER)
Admission: RE | Admit: 2015-03-06 | Discharge: 2015-03-06 | Disposition: A | Discharge status: Home / Self Care | Payer: Medicare HMO | Source: Ambulatory Visit | Attending: Plastic Surgery | Admitting: Plastic Surgery

## 2015-03-06 DIAGNOSIS — Z9011 Acquired absence of right breast and nipple: Secondary | ICD-10-CM | POA: Diagnosis not present

## 2015-03-06 DIAGNOSIS — E119 Type 2 diabetes mellitus without complications: Secondary | ICD-10-CM | POA: Insufficient documentation

## 2015-03-06 DIAGNOSIS — F419 Anxiety disorder, unspecified: Secondary | ICD-10-CM | POA: Diagnosis not present

## 2015-03-06 DIAGNOSIS — N651 Disproportion of reconstructed breast: Secondary | ICD-10-CM | POA: Diagnosis not present

## 2015-03-06 DIAGNOSIS — Z853 Personal history of malignant neoplasm of breast: Secondary | ICD-10-CM | POA: Insufficient documentation

## 2015-03-06 DIAGNOSIS — I10 Essential (primary) hypertension: Secondary | ICD-10-CM | POA: Diagnosis not present

## 2015-03-06 DIAGNOSIS — M069 Rheumatoid arthritis, unspecified: Secondary | ICD-10-CM | POA: Insufficient documentation

## 2015-03-06 HISTORY — PX: PORT-A-CATH REMOVAL: SHX5289

## 2015-03-06 HISTORY — PX: BREAST RECONSTRUCTION: SHX9

## 2015-03-06 HISTORY — PX: LIPOSUCTION WITH LIPOFILLING: SHX6436

## 2015-03-06 SURGERY — AEROLA/NIPPLE RECONSTRUCTION WITH GRAFT
Anesthesia: General | Site: Chest | Laterality: Right

## 2015-03-06 MED ORDER — BACITRACIN 500 UNIT/GM EX OINT
TOPICAL_OINTMENT | CUTANEOUS | Status: DC | PRN
  Administered 2015-03-06: 1 via TOPICAL

## 2015-03-06 MED ORDER — LIDOCAINE HCL (CARDIAC) 20 MG/ML IV SOLN
INTRAVENOUS | Status: DC | PRN
  Administered 2015-03-06: 50 mg via INTRAVENOUS

## 2015-03-06 MED ORDER — SCOPOLAMINE 1 MG/3DAYS TD PT72
MEDICATED_PATCH | TRANSDERMAL | Status: AC
  Filled 2015-03-06: qty 1

## 2015-03-06 MED ORDER — MIDAZOLAM HCL 2 MG/2ML IJ SOLN
INTRAMUSCULAR | Status: AC
  Filled 2015-03-06: qty 2

## 2015-03-06 MED ORDER — HYDROMORPHONE HCL 1 MG/ML IJ SOLN
0.2500 mg | INTRAMUSCULAR | Status: DC | PRN
  Administered 2015-03-06 (×3): 0.5 mg via INTRAVENOUS

## 2015-03-06 MED ORDER — ONDANSETRON HCL 4 MG/2ML IJ SOLN: 4.0000 mg | Freq: Once | INTRAMUSCULAR | Status: DC | PRN

## 2015-03-06 MED ORDER — LIDOCAINE HCL (PF) 1 % IJ SOLN
INTRAMUSCULAR | Status: AC
  Filled 2015-03-06: qty 60

## 2015-03-06 MED ORDER — BACITRACIN ZINC 500 UNIT/GM EX OINT
TOPICAL_OINTMENT | CUTANEOUS | Status: AC
  Filled 2015-03-06: qty 0.9

## 2015-03-06 MED ORDER — BUPIVACAINE-EPINEPHRINE (PF) 0.25% -1:200000 IJ SOLN
INTRAMUSCULAR | Status: AC
  Filled 2015-03-06: qty 30

## 2015-03-06 MED ORDER — HYDROMORPHONE HCL 1 MG/ML IJ SOLN
INTRAMUSCULAR | Status: AC
  Filled 2015-03-06: qty 1

## 2015-03-06 MED ORDER — CHLORHEXIDINE GLUCONATE 4 % EX LIQD: 1.0000 "application " | Freq: Once | CUTANEOUS | Status: DC

## 2015-03-06 MED ORDER — PROPOFOL 10 MG/ML IV BOLUS
INTRAVENOUS | Status: DC | PRN
  Administered 2015-03-06: 150 mg via INTRAVENOUS

## 2015-03-06 MED ORDER — MIDAZOLAM HCL 2 MG/2ML IJ SOLN
1.0000 mg | INTRAMUSCULAR | Status: DC | PRN
  Administered 2015-03-06: 2 mg via INTRAVENOUS

## 2015-03-06 MED ORDER — FENTANYL CITRATE (PF) 100 MCG/2ML IJ SOLN: 50.0000 ug | INTRAMUSCULAR | Status: DC | PRN

## 2015-03-06 MED ORDER — PHENYLEPHRINE HCL 10 MG/ML IJ SOLN
10.0000 mg | INTRAVENOUS | Status: DC | PRN
  Administered 2015-03-06: 40 ug/min via INTRAVENOUS

## 2015-03-06 MED ORDER — SUFENTANIL CITRATE 50 MCG/ML IV SOLN
INTRAVENOUS | Status: AC
  Filled 2015-03-06: qty 1

## 2015-03-06 MED ORDER — SCOPOLAMINE 1 MG/3DAYS TD PT72
MEDICATED_PATCH | TRANSDERMAL | Status: DC | PRN
  Administered 2015-03-06: 1 via TRANSDERMAL

## 2015-03-06 MED ORDER — EPHEDRINE SULFATE 50 MG/ML IJ SOLN
INTRAMUSCULAR | Status: DC | PRN
  Administered 2015-03-06: 15 mg via INTRAVENOUS

## 2015-03-06 MED ORDER — GLYCOPYRROLATE 0.2 MG/ML IJ SOLN: 0.2000 mg | Freq: Once | INTRAMUSCULAR | Status: DC | PRN

## 2015-03-06 MED ORDER — SUFENTANIL CITRATE 50 MCG/ML IV SOLN
INTRAVENOUS | Status: DC | PRN
  Administered 2015-03-06: 10 ug via INTRAVENOUS
  Administered 2015-03-06: 5 ug via INTRAVENOUS

## 2015-03-06 MED ORDER — CEFAZOLIN SODIUM-DEXTROSE 2-3 GM-% IV SOLR
INTRAVENOUS | Status: AC
  Filled 2015-03-06: qty 50

## 2015-03-06 MED ORDER — DEXAMETHASONE SODIUM PHOSPHATE 4 MG/ML IJ SOLN
INTRAMUSCULAR | Status: DC | PRN
  Administered 2015-03-06: 8 mg via INTRAVENOUS

## 2015-03-06 MED ORDER — EPINEPHRINE HCL 1 MG/ML IJ SOLN
INTRAMUSCULAR | Status: AC
  Filled 2015-03-06: qty 1

## 2015-03-06 MED ORDER — OXYCODONE HCL 5 MG PO TABS: 5.0000 mg | ORAL_TABLET | ORAL | Status: DC | PRN

## 2015-03-06 MED ORDER — MEPERIDINE HCL 25 MG/ML IJ SOLN: 6.2500 mg | INTRAMUSCULAR | Status: DC | PRN

## 2015-03-06 MED ORDER — LIDOCAINE-EPINEPHRINE 1 %-1:100000 IJ SOLN
INTRAMUSCULAR | Status: AC
  Filled 2015-03-06: qty 1

## 2015-03-06 MED ORDER — LACTATED RINGERS IV SOLN
INTRAVENOUS | Status: DC
  Administered 2015-03-06: 08:00:00 via INTRAVENOUS

## 2015-03-06 MED ORDER — ONDANSETRON HCL 4 MG/2ML IJ SOLN
INTRAMUSCULAR | Status: DC | PRN
  Administered 2015-03-06: 4 mg via INTRAVENOUS

## 2015-03-06 MED ORDER — SODIUM BICARBONATE 4 % IV SOLN
INTRAVENOUS | Status: AC
  Filled 2015-03-06: qty 10

## 2015-03-06 MED ORDER — SUCCINYLCHOLINE CHLORIDE 20 MG/ML IJ SOLN
INTRAMUSCULAR | Status: DC | PRN
  Administered 2015-03-06: 120 mg via INTRAVENOUS

## 2015-03-06 MED ORDER — CEFAZOLIN SODIUM-DEXTROSE 2-3 GM-% IV SOLR
2.0000 g | INTRAVENOUS | Status: AC
  Administered 2015-03-06: 2 g via INTRAVENOUS

## 2015-03-06 MED ORDER — SODIUM BICARBONATE 4 % IV SOLN
INTRAVENOUS | Status: DC | PRN
  Administered 2015-03-06: 750 mL via INTRAMUSCULAR

## 2015-03-06 SURGICAL SUPPLY — 91 items
BINDER ABDOMINAL 10 UNV 27-48 (MISCELLANEOUS) ×4 IMPLANT
BINDER ABDOMINAL 12 SM 30-45 (SOFTGOODS) IMPLANT
BINDER BREAST LRG (GAUZE/BANDAGES/DRESSINGS) IMPLANT
BINDER BREAST MEDIUM (GAUZE/BANDAGES/DRESSINGS) IMPLANT
BINDER BREAST XLRG (GAUZE/BANDAGES/DRESSINGS) IMPLANT
BINDER BREAST XXLRG (GAUZE/BANDAGES/DRESSINGS) IMPLANT
BLADE CLIPPER SURG (BLADE) IMPLANT
BLADE SURG 10 STRL SS (BLADE) ×4 IMPLANT
BLADE SURG 11 STRL SS (BLADE) ×4 IMPLANT
BLADE SURG 15 STRL LF DISP TIS (BLADE) ×2 IMPLANT
BLADE SURG 15 STRL SS (BLADE) ×2
BNDG GAUZE ELAST 4 BULKY (GAUZE/BANDAGES/DRESSINGS) ×8 IMPLANT
BRUSH SCRUB EZ PLAIN DRY (MISCELLANEOUS) ×4 IMPLANT
CANISTER LIPO FAT HARVEST (MISCELLANEOUS) ×4 IMPLANT
CANISTER SUCT 1200ML W/VALVE (MISCELLANEOUS) ×4 IMPLANT
CHLORAPREP W/TINT 26ML (MISCELLANEOUS) ×8 IMPLANT
CLOSURE WOUND 1/2 X4 (GAUZE/BANDAGES/DRESSINGS)
CLOSURE WOUND 1/4X4 (GAUZE/BANDAGES/DRESSINGS)
COTTONBALL LRG STERILE PKG (GAUZE/BANDAGES/DRESSINGS) IMPLANT
COVER BACK TABLE 60X90IN (DRAPES) IMPLANT
COVER MAYO STAND STRL (DRAPES) ×4 IMPLANT
DECANTER SPIKE VIAL GLASS SM (MISCELLANEOUS) IMPLANT
DRAIN CHANNEL 15F RND FF W/TCR (WOUND CARE) IMPLANT
DRAPE LAPAROSCOPIC ABDOMINAL (DRAPES) ×4 IMPLANT
DRSG EMULSION OIL 3X3 NADH (GAUZE/BANDAGES/DRESSINGS) ×4 IMPLANT
DRSG PAD ABDOMINAL 8X10 ST (GAUZE/BANDAGES/DRESSINGS) ×16 IMPLANT
DRSG TEGADERM 2-3/8X2-3/4 SM (GAUZE/BANDAGES/DRESSINGS) IMPLANT
ELECT COATED BLADE 2.86 ST (ELECTRODE) ×4 IMPLANT
ELECT NEEDLE BLADE 2-5/6 (NEEDLE) IMPLANT
ELECT REM PT RETURN 9FT ADLT (ELECTROSURGICAL) ×4
ELECTRODE REM PT RTRN 9FT ADLT (ELECTROSURGICAL) ×2 IMPLANT
EVACUATOR SILICONE 100CC (DRAIN) IMPLANT
FILTER LIPOSUCTION (MISCELLANEOUS) ×4 IMPLANT
GAUZE XEROFORM 1X8 LF (GAUZE/BANDAGES/DRESSINGS) IMPLANT
GAUZE XEROFORM 5X9 LF (GAUZE/BANDAGES/DRESSINGS) IMPLANT
GLOVE BIO SURGEON STRL SZ 6 (GLOVE) ×8 IMPLANT
GLOVE BIO SURGEON STRL SZ 6.5 (GLOVE) IMPLANT
GLOVE BIO SURGEONS STRL SZ 6.5 (GLOVE)
GLOVE BIOGEL PI IND STRL 7.0 (GLOVE) ×2 IMPLANT
GLOVE BIOGEL PI INDICATOR 7.0 (GLOVE) ×2
GLOVE ECLIPSE 6.5 STRL STRAW (GLOVE) ×4 IMPLANT
GLOVE EXAM NITRILE EXT CUFF MD (GLOVE) ×4 IMPLANT
GOWN STRL REUS W/ TWL LRG LVL3 (GOWN DISPOSABLE) ×4 IMPLANT
GOWN STRL REUS W/TWL LRG LVL3 (GOWN DISPOSABLE) ×4
LINER CANISTER 1000CC FLEX (MISCELLANEOUS) ×4 IMPLANT
LIQUID BAND (GAUZE/BANDAGES/DRESSINGS) ×8 IMPLANT
NDL SAFETY ECLIPSE 18X1.5 (NEEDLE) ×8 IMPLANT
NEEDLE HYPO 18GX1.5 SHARP (NEEDLE) ×8
NEEDLE PRECISIONGLIDE 27X1.5 (NEEDLE) IMPLANT
NS IRRIG 1000ML POUR BTL (IV SOLUTION) ×4 IMPLANT
PACK BASIN DAY SURGERY FS (CUSTOM PROCEDURE TRAY) ×4 IMPLANT
PACK UNIVERSAL I (CUSTOM PROCEDURE TRAY) ×4 IMPLANT
PAD ALCOHOL SWAB (MISCELLANEOUS) ×4 IMPLANT
PENCIL BUTTON HOLSTER BLD 10FT (ELECTRODE) ×4 IMPLANT
SHEET MEDIUM DRAPE 40X70 STRL (DRAPES) IMPLANT
SLEEVE SCD COMPRESS KNEE MED (MISCELLANEOUS) ×4 IMPLANT
SPONGE GAUZE 2X2 8PLY STER LF (GAUZE/BANDAGES/DRESSINGS)
SPONGE GAUZE 2X2 8PLY STRL LF (GAUZE/BANDAGES/DRESSINGS) IMPLANT
SPONGE LAP 18X18 X RAY DECT (DISPOSABLE) ×4 IMPLANT
STAPLER VISISTAT 35W (STAPLE) ×4 IMPLANT
STRIP CLOSURE SKIN 1/2X4 (GAUZE/BANDAGES/DRESSINGS) IMPLANT
STRIP CLOSURE SKIN 1/4X4 (GAUZE/BANDAGES/DRESSINGS) IMPLANT
SUT ETHILON 2 0 FS 18 (SUTURE) IMPLANT
SUT MNCRL AB 4-0 PS2 18 (SUTURE) ×4 IMPLANT
SUT PLAIN 5 0 P 3 18 (SUTURE) ×4 IMPLANT
SUT PROLENE 2 0 CT2 30 (SUTURE) IMPLANT
SUT PROLENE 5 0 P 3 (SUTURE) IMPLANT
SUT PROLENE 5 0 PS 2 (SUTURE) IMPLANT
SUT PROLENE 6 0 P 1 18 (SUTURE) IMPLANT
SUT VIC AB 3-0 PS1 18 (SUTURE)
SUT VIC AB 3-0 PS1 18XBRD (SUTURE) IMPLANT
SUT VIC AB 3-0 SH 27 (SUTURE)
SUT VIC AB 3-0 SH 27X BRD (SUTURE) IMPLANT
SUT VIC AB 4-0 P-3 18XBRD (SUTURE) ×4 IMPLANT
SUT VIC AB 4-0 P3 18 (SUTURE) ×4
SUT VICRYL 4-0 PS2 18IN ABS (SUTURE) ×4 IMPLANT
SYR 20CC LL (SYRINGE) IMPLANT
SYR 50ML LL SCALE MARK (SYRINGE) ×20 IMPLANT
SYR BULB 3OZ (MISCELLANEOUS) IMPLANT
SYR BULB IRRIGATION 50ML (SYRINGE) ×4 IMPLANT
SYR CONTROL 10ML LL (SYRINGE) ×4 IMPLANT
SYR TB 1ML LL NO SAFETY (SYRINGE) ×4 IMPLANT
SYRINGE 10CC LL (SYRINGE) ×12 IMPLANT
TOWEL OR 17X24 6PK STRL BLUE (TOWEL DISPOSABLE) ×8 IMPLANT
TRAY DSU PREP LF (CUSTOM PROCEDURE TRAY) IMPLANT
TUBE CONNECTING 20'X1/4 (TUBING) ×1
TUBE CONNECTING 20X1/4 (TUBING) ×3 IMPLANT
TUBING INFILTRATION IT-10001 (TUBING) ×4 IMPLANT
TUBING SET GRADUATE ASPIR 12FT (MISCELLANEOUS) ×4 IMPLANT
UNDERPAD 30X30 (UNDERPADS AND DIAPERS) ×8 IMPLANT
YANKAUER SUCT BULB TIP NO VENT (SUCTIONS) ×4 IMPLANT

## 2015-03-06 NOTE — Discharge Instructions (Signed)

## 2015-03-06 NOTE — Interval H&P Note (Signed)
History and Physical Interval Note:  03/06/2015 8:20 AM  Angela Gross  has presented today for surgery, with the diagnosis of HISTORY OF BREAST CANCER AND ACQUIRED OF ABCESS BREAST  The various methods of treatment have been discussed with the patient and family. After consideration of risks, benefits and other options for treatment, the patient has consented to  Removal right port, lipofilling to right chest, right nipple reconstruction with local flaps as a surgical intervention .  The patient's history has been reviewed, patient examined, no change in status, stable for surgery.  I have reviewed the patient's chart and labs.  Questions were answered to the patient's satisfaction.     Deborra Phegley

## 2015-03-06 NOTE — Op Note (Signed)
Operative Note   DATE OF OPERATION: 5.24.2016  LOCATION: [place]- outpatient  SURGICAL DIVISION: Plastic Surgery  PREOPERATIVE DIAGNOSES:  1. Acquired absence right breast 2. History right breast cancer 3. Asymmetry native and reconstructed breast  POSTOPERATIVE DIAGNOSES:  same  PROCEDURE:  1. Removal right port 2. Lipofilling to right chest 3. Right nipple reconstruction  SURGEON: Irene Limbo MD MBA  ASSISTANT: none  ANESTHESIA:  General.   EBL: 75 ml  COMPLICATIONS: None immediate.   INDICATIONS FOR PROCEDURE:  The patient, Angela Gross, is a 64 y.o. female born on 09/30/1951, is here for right nipple reconstruction, additional fat grafting, and removal port following her implant based reconstruction.    FINDINGS: 110 ml fat infiltrated over right reconstructed breast.  DESCRIPTION OF PROCEDURE:  The patient was marked with the patient in the preoperative area in standing position to mark areas over supraumbilical and infraumbilical abdomen for fat harvest, chest midline, breast meridians, sternal notch, anterior axillary lines, and inframammary folds. The location for right nipple was marked symmetric with left breast. The patient was taken to the operating room. SCDs were placed and IV antibiotics were given. The patient's operative site was prepped and draped in a sterile fashion. A time out was performed and all information was confirmed to be correct.   Incision made in right upper chest scar and dissection carried through superficial fascia and subcutaneous tissue to port. Sutures removed and capsule incised surrounding port. Port removed. Closure completed with 4-0 vicryl interrupted in superficial fascia and dermis. Skin closure completed following lipofilling as described below with 4-0 monocryl subcuticular. Tumescent solution infiltrated over abdomen through prior incisions. Total of 500 ml fluid infiltrated. Power assisted liposuction completed over  suprumbilical and infraumbilical abdomen to endpoint of symmetric contour and soft tissue thickness. Total 400 ml lipoaspirate. Fat prepared for infiltration with washing and gravity. Stab incisions approximated with interrupted 4-0 monocryl.  Using blunt canula, prepared fat placed subcutaneously over superior poles and within mastectomy flaps.  A modified star flap, based superiorly, was designed over right reconstructed breast. The flaps were sharply incised and elevated in subcutaneous place. The lateral flaps were approximated in midline with 5-0 vicryl in dermis. The superior flap also inset and skin brought together with 5-0 plain gut. Donor site was closed primarily with 4-0 vicryl in dermis and 5-0 plain gut for skin closure. Antibiotic ointment applied, followed by Adaptic and sterile sponge.   Tissue adhesive was applied to port incisions, as well as additional access points for fat grafting, followed by dry dressing and abdominal binder applied. The patient was allowed to wake from anesthesia, extubated and taken to the recovery room in satisfactory condition.   SPECIMENS: none  DRAINS: none  Irene Limbo, MD [facility]astic & Reconstructive Surgery [phone]

## 2015-03-06 NOTE — Anesthesia Procedure Notes (Signed)
Procedure Name: Intubation Date/Time: 03/06/2015 9:04 AM Performed by: Melynda Ripple D Pre-anesthesia Checklist: Patient identified, Emergency Drugs available, Suction available and Patient being monitored Patient Re-evaluated:Patient Re-evaluated prior to inductionOxygen Delivery Method: Circle System Utilized Preoxygenation: Pre-oxygenation with 100% oxygen Intubation Type: IV induction Ventilation: Mask ventilation without difficulty Laryngoscope Size: Mac and 3 Grade View: Grade I Tube type: Oral Tube size: 7.0 mm Number of attempts: 1 Airway Equipment and Method: Stylet and Oral airway Placement Confirmation: ETT inserted through vocal cords under direct vision,  positive ETCO2 and breath sounds checked- equal and bilateral Secured at: 22 cm Tube secured with: Tape Dental Injury: Teeth and Oropharynx as per pre-operative assessment

## 2015-03-06 NOTE — Transfer of Care (Signed)
Immediate Anesthesia Transfer of Care Note  Patient: Angela Gross  Procedure(s) Performed: Procedure(s): RIGHT NIPPLE AEROLA COMPLEX RECONSTRUCTION AND LOCAL FLAP WITH LIPO FILLING TO RIGHT BREAST,REMOVABLE RIGHT CHEST PORT (Right)  LIPOFILLING (Right)  Patient Location: PACU  Anesthesia Type:General  Level of Consciousness: awake, alert  and oriented  Airway & Oxygen Therapy: Patient Spontanous Breathing and Patient connected to face mask oxygen  Post-op Assessment: Report given to RN and Post -op Vital signs reviewed and stable  Post vital signs: Reviewed and stable  Last Vitals:  Filed Vitals:   03/06/15 1054  BP:   Pulse: 84  Temp:   Resp:     Complications: No apparent anesthesia complications

## 2015-03-06 NOTE — Anesthesia Postprocedure Evaluation (Signed)
Anesthesia Post Note  Patient: Angela Gross  Procedure(s) Performed: Procedure(s) (LRB): RIGHT NIPPLE AEROLA COMPLEX RECONSTRUCTION AND LOCAL FLAP WITH LIPO FILLING TO RIGHT BREAST,REMOVAL RIGHT CHEST PORT (Right) ABDOMINAL LIPOSUCTION WITH LIPOFILLING TO RIGHT BREAST (Right)  Anesthesia type: general  Patient location: PACU  Post pain: Pain level controlled  Post assessment: Patient's Cardiovascular Status Stable  Last Vitals:  Filed Vitals:   03/06/15 1200  BP: 138/95  Pulse: 77  Temp:   Resp: 14    Post vital signs: Reviewed and stable  Level of consciousness: sedated  Complications: No apparent anesthesia complications

## 2015-03-06 NOTE — Anesthesia Preprocedure Evaluation (Addendum)
Anesthesia Evaluation  Patient identified by MRN, date of birth, ID band Patient awake    Reviewed: Allergy & Precautions, NPO status , Patient's Chart, lab work & pertinent test results  History of Anesthesia Complications (+) PONV  Airway Mallampati: I  TM Distance: >3 FB Neck ROM: Full    Dental   Pulmonary    Pulmonary exam normal       Cardiovascular hypertension, Pt. on medications Normal cardiovascular exam ECHO 3/16 Study Conclusions  - Left ventricle: The cavity size was normal. Wall thickness was normal. Systolic function was normal. The estimated ejection fraction was in the range of 55% to 60%. Strain imaging may be inaccurate due to poor endocardial definition in the anteroseptal walls - GLPSS is -16%. Wall motion was normal; there were no regional wall motion abnormalities. Left ventricular diastolic function parameters were normal. Ejection fraction (MOD, 2-plane): 58%. - Mitral valve: Mildly thickened leaflets . There was mild regurgitation. - Left atrium: The atrium was normal in size.  Impressions:  - LVEF 55-60% - strain is abnormal at -16% (improved prior to the earlier study), however, I question its&' accuracy - there is poor anterior myocardial definition. I have measured EF by simpson&'s biplane to be 58%.    Neuro/Psych Anxiety    GI/Hepatic   Endo/Other  diabetes, Type 2  Renal/GU      Musculoskeletal   Abdominal   Peds  Hematology   Anesthesia Other Findings   Reproductive/Obstetrics                            Anesthesia Physical Anesthesia Plan  ASA: II  Anesthesia Plan: General   Post-op Pain Management:    Induction: Intravenous  Airway Management Planned: LMA  Additional Equipment:   Intra-op Plan:   Post-operative Plan: Extubation in OR  Informed Consent: I have reviewed the patients History and Physical, chart,  labs and discussed the procedure including the risks, benefits and alternatives for the proposed anesthesia with the patient or authorized representative who has indicated his/her understanding and acceptance.     Plan Discussed with: CRNA and Surgeon  Anesthesia Plan Comments:         Anesthesia Quick Evaluation

## 2015-03-07 ENCOUNTER — Encounter (HOSPITAL_BASED_OUTPATIENT_CLINIC_OR_DEPARTMENT_OTHER): Payer: Self-pay | Admitting: Plastic Surgery

## 2015-03-08 ENCOUNTER — Ambulatory Visit (HOSPITAL_BASED_OUTPATIENT_CLINIC_OR_DEPARTMENT_OTHER): Payer: Medicare HMO

## 2015-03-08 ENCOUNTER — Encounter (HOSPITAL_BASED_OUTPATIENT_CLINIC_OR_DEPARTMENT_OTHER): Payer: Self-pay | Admitting: Plastic Surgery

## 2015-03-08 VITALS — BP 131/88 | HR 82 | Temp 98.6°F

## 2015-03-08 DIAGNOSIS — M810 Age-related osteoporosis without current pathological fracture: Secondary | ICD-10-CM

## 2015-03-08 MED ORDER — DENOSUMAB 60 MG/ML ~~LOC~~ SOLN
60.0000 mg | Freq: Once | SUBCUTANEOUS | Status: AC
  Administered 2015-03-08: 60 mg via SUBCUTANEOUS
  Filled 2015-03-08: qty 1

## 2015-03-08 NOTE — Patient Instructions (Signed)
Denosumab injection What is this medicine? DENOSUMAB (den oh sue mab) slows bone breakdown. Prolia is used to treat osteoporosis in women after menopause and in men. Xgeva is used to prevent bone fractures and other bone problems caused by cancer bone metastases. Xgeva is also used to treat giant cell tumor of the bone. This medicine may be used for other purposes; ask your health care provider or pharmacist if you have questions. COMMON BRAND NAME(S): Prolia, XGEVA What should I tell my health care provider before I take this medicine? They need to know if you have any of these conditions: -dental disease -eczema -infection or history of infections -kidney disease or on dialysis -low blood calcium or vitamin D -malabsorption syndrome -scheduled to have surgery or tooth extraction -taking medicine that contains denosumab -thyroid or parathyroid disease -an unusual reaction to denosumab, other medicines, foods, dyes, or preservatives -pregnant or trying to get pregnant -breast-feeding How should I use this medicine? This medicine is for injection under the skin. It is given by a health care professional in a hospital or clinic setting. If you are getting Prolia, a special MedGuide will be given to you by the pharmacist with each prescription and refill. Be sure to read this information carefully each time. For Prolia, talk to your pediatrician regarding the use of this medicine in children. Special care may be needed. For Xgeva, talk to your pediatrician regarding the use of this medicine in children. While this drug may be prescribed for children as young as 13 years for selected conditions, precautions do apply. Overdosage: If you think you've taken too much of this medicine contact a poison control center or emergency room at once. Overdosage: If you think you have taken too much of this medicine contact a poison control center or emergency room at once. NOTE: This medicine is only for  you. Do not share this medicine with others. What if I miss a dose? It is important not to miss your dose. Call your doctor or health care professional if you are unable to keep an appointment. What may interact with this medicine? Do not take this medicine with any of the following medications: -other medicines containing denosumab This medicine may also interact with the following medications: -medicines that suppress the immune system -medicines that treat cancer -steroid medicines like prednisone or cortisone This list may not describe all possible interactions. Give your health care provider a list of all the medicines, herbs, non-prescription drugs, or dietary supplements you use. Also tell them if you smoke, drink alcohol, or use illegal drugs. Some items may interact with your medicine. What should I watch for while using this medicine? Visit your doctor or health care professional for regular checks on your progress. Your doctor or health care professional may order blood tests and other tests to see how you are doing. Call your doctor or health care professional if you get a cold or other infection while receiving this medicine. Do not treat yourself. This medicine may decrease your body's ability to fight infection. You should make sure you get enough calcium and vitamin D while you are taking this medicine, unless your doctor tells you not to. Discuss the foods you eat and the vitamins you take with your health care professional. See your dentist regularly. Brush and floss your teeth as directed. Before you have any dental work done, tell your dentist you are receiving this medicine. Do not become pregnant while taking this medicine or for 5 months after stopping   it. Women should inform their doctor if they wish to become pregnant or think they might be pregnant. There is a potential for serious side effects to an unborn child. Talk to your health care professional or pharmacist for more  information. What side effects may I notice from receiving this medicine? Side effects that you should report to your doctor or health care professional as soon as possible: -allergic reactions like skin rash, itching or hives, swelling of the face, lips, or tongue -breathing problems -chest pain -fast, irregular heartbeat -feeling faint or lightheaded, falls -fever, chills, or any other sign of infection -muscle spasms, tightening, or twitches -numbness or tingling -skin blisters or bumps, or is dry, peels, or red -slow healing or unexplained pain in the mouth or jaw -unusual bleeding or bruising Side effects that usually do not require medical attention (Report these to your doctor or health care professional if they continue or are bothersome.): -muscle pain -stomach upset, gas This list may not describe all possible side effects. Call your doctor for medical advice about side effects. You may report side effects to FDA at 1-800-FDA-1088. Where should I keep my medicine? This medicine is only given in a clinic, doctor's office, or other health care setting and will not be stored at home. NOTE: This sheet is a summary. It may not cover all possible information. If you have questions about this medicine, talk to your doctor, pharmacist, or health care provider.  2015, Elsevier/Gold Standard. (2012-03-29 12:37:47)  

## 2015-03-26 ENCOUNTER — Telehealth (HOSPITAL_COMMUNITY): Payer: Self-pay | Admitting: Cardiology

## 2015-04-05 ENCOUNTER — Other Ambulatory Visit (HOSPITAL_COMMUNITY): Payer: Self-pay | Admitting: *Deleted

## 2015-04-05 ENCOUNTER — Ambulatory Visit (HOSPITAL_COMMUNITY): Payer: Medicare HMO

## 2015-04-05 ENCOUNTER — Inpatient Hospital Stay (HOSPITAL_COMMUNITY): Admission: RE | Admit: 2015-04-05 | Payer: Medicare HMO | Source: Ambulatory Visit

## 2015-04-05 MED ORDER — CARVEDILOL 12.5 MG PO TABS: 12.5000 mg | ORAL_TABLET | Freq: Two times a day (BID) | ORAL | Status: DC

## 2015-04-25 ENCOUNTER — Encounter (HOSPITAL_COMMUNITY): Payer: Self-pay

## 2015-04-25 ENCOUNTER — Ambulatory Visit (HOSPITAL_COMMUNITY)
Admission: RE | Admit: 2015-04-25 | Discharge: 2015-04-25 | Disposition: A | Discharge status: Home / Self Care | Payer: Medicare HMO | Source: Ambulatory Visit | Attending: Cardiology | Admitting: Cardiology

## 2015-04-25 ENCOUNTER — Ambulatory Visit (HOSPITAL_BASED_OUTPATIENT_CLINIC_OR_DEPARTMENT_OTHER)
Admission: RE | Admit: 2015-04-25 | Discharge: 2015-04-25 | Disposition: A | Discharge status: Home / Self Care | Payer: Medicare HMO | Source: Ambulatory Visit | Attending: Cardiology | Admitting: Cardiology

## 2015-04-25 VITALS — BP 124/82 | HR 68 | Wt 161.0 lb

## 2015-04-25 DIAGNOSIS — I427 Cardiomyopathy due to drug and external agent: Secondary | ICD-10-CM

## 2015-04-25 DIAGNOSIS — C50311 Malignant neoplasm of lower-inner quadrant of right female breast: Secondary | ICD-10-CM | POA: Diagnosis present

## 2015-04-25 DIAGNOSIS — T451X5A Adverse effect of antineoplastic and immunosuppressive drugs, initial encounter: Principal | ICD-10-CM

## 2015-04-25 MED ORDER — PERFLUTREN LIPID MICROSPHERE
INTRAVENOUS | Status: AC
  Filled 2015-04-25: qty 10

## 2015-04-25 NOTE — Progress Notes (Signed)
Patient ID: Angela Gross, female   DOB: [birth year], 64 y.o.   MRN: [id number] Primary oncologist: Dr. Jana Hakim  64 yo with history of breast cancer diagnosed in 12/14 s/p mastectomy presents to cardio-oncology clinic.  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  She has no significant cardiac history.  Her mother had CHF but we do not know the cause.    She was started on carboplatin, docetaxel, and Herceptin in 4/15 but only tolerated 2 cycles.  Then switched to Herceptin/Perjeta and Anastazole. Had 4 cycles and then Herceptin/Perjeta stopped due to cardiotoxicity.  Herceptin was later restarted.   She returns today follow up with Echo for chemo therapy induced cardiomyopathy. Only has mild dyspnea with exertion when she's really pushing herself, especially when she is in a hurry. It is non limiting, and she can perform all ADLs without difficulty.  [facility]   She will be getting Herceptin until 02/27/15.   Denies peripheral edema, CP, orthopnea, lightheadedness or dizziness.  Labs (9/15): K 3.5, creatinine 1.1 Labs (3/16): K 3.6 Creatinine 0.9  Labs (5/16): K 3.6, Creatinine 1.0  PMH: 1. Breast cancer: Diagnosed in 12/14.  ER+/PR-/HER2neu+.  She is now s/p right mastectomy.  - Echo (3/15) with EF 55-60%, lateral s' 11 cm/sec (no strain) - ECHO 05/08/14 EF ~ 50% Lateral S' 9.2 GLS -15.9  - ECHO 06/08/14 EF 55% Lateral s' 7.2 cm/sec GLS - 17.3 - ECHO 9/15 EF 50-55%, Lateral s' 10, GLS -15 - ECHO 11/15 EF 33-82%, normal diastolic function, lateral s' 11.3, GLS -16.5%, normla RV size and systolic function.  - ECHO 1/16 EF 50-55%, lateral s' 11.1 (difficult), GLS -15.9% - ECHO 01/08/2015: EF 50-55% Lateral S' 10.1 (poor window) GLS - 15.6%  - ECHO 7/16: EF 55%, strain not done 2. Rheumatoid arthritis 3. Raynauds syndrome 4. OA 5. Degenerative disc disease: C-spine  SH: Lives with husband in [place], nonsmoker.   FH: Mother with CHF.    ROS: All systems reviewed and negative except as per HPI.   Current Outpatient Prescriptions  Medication Sig Dispense Refill  . anastrozole (ARIMIDEX) 1 MG tablet Take 1 tablet (1 mg total) by mouth daily. 30 tablet 12  . carvedilol (COREG) 12.5 MG tablet Take 1 tablet (12.5 mg total) by mouth 2 (two) times daily with a meal. 60 tablet 0  . gabapentin (NEURONTIN) 300 MG capsule Take 1 capsule (300 mg total) by mouth 3 (three) times daily. For neuropathy and hot flashes 270 capsule 1  . lisinopril (PRINIVIL,ZESTRIL) 10 MG tablet Take 1 tablet (10 mg total) by mouth daily. 90 tablet 3  . cyclobenzaprine (FLEXERIL) 10 MG tablet Take 1 tablet (10 mg total) by mouth 3 (three) times daily as needed for muscle spasms. (Patient not taking: Reported on 04/25/2015) 30 tablet 1  . oxyCODONE (OXY IR/ROXICODONE) 5 MG immediate release tablet Take 1 tablet (5 mg total) by mouth every 4 (four) hours as needed for severe pain. (Patient not taking: Reported on 04/25/2015) 40 tablet 0  . traMADol (ULTRAM) 50 MG tablet Take 1 tablet (50 mg total) by mouth 3 (three) times daily as needed (pain). (Patient not taking: Reported on 04/25/2015) 90 tablet 1   No current facility-administered medications for this encounter.   BP 124/82 mmHg  Pulse 68  Wt 161 lb (73.029 kg)  SpO2 99% General: NAD.  Neck: No JVD, no thyromegaly or thyroid nodule.  Lungs: CTA bilaterally with normal respiratory  effort. CV: Nondisplaced PMI.  Heart regular S1/S2, no S3/S4, no murmur appreciated.  No peripheral edema.  No carotid bruit.  Normal pedal pulses.  Abdomen: Soft, nontender, no hepatosplenomegaly, no distention.  Skin: Intact without lesions or rashes.  Neurologic: Alert and oriented x 3.  Psych: Normal affect. Extremities: No clubbing or cyanosis.  HEENT: Normal.   Assessment/Plan: 1. R Breast Cancer- mT1 N0, stage IA invasive ductal carcinoma. Herceptin stopped 7/15 due to drop in EF and restarted in 9/15. Herceptin  finished 02/27/15 2. Chemotherapy-induced cardiomyopathy: Herceptin-related.  Completed Herceptin 02/27/15  - Continue current Coreg 12.5 mg twice a day and lisinopril 10 mg daily at least until next May. (x 1 year after finishing Chemo) - ECHO today shows  EF 55%.  Echo in the future with worsening SOB, reasonable for repeat in 5 years.  Follow up as needed.  Shirley Friar PA-C 04/25/2015   Patient seen with PA, agree with the above note.  I reviewed today's echo, EF is 55%.  She has completed Herceptin.  At this point, we will have her followup as needed.  She will continue Coreg and lisinopril for now.   Loralie Champagne 04/26/2015

## 2015-04-25 NOTE — Progress Notes (Signed)
Echocardiogram 2D Echocardiogram with Definity has been performed.  Tresa Res 04/25/2015, 3:57 PM

## 2015-04-26 MED FILL — Perflutren Lipid Microsphere IV Susp 1.1 MG/ML: INTRAVENOUS | Qty: 10 | Status: AC

## 2015-08-15 ENCOUNTER — Other Ambulatory Visit: Payer: Medicare HMO

## 2015-08-15 ENCOUNTER — Ambulatory Visit: Payer: Medicare HMO | Admitting: Nurse Practitioner

## 2015-08-22 ENCOUNTER — Telehealth: Payer: Self-pay | Admitting: Nurse Practitioner

## 2015-08-22 NOTE — Telephone Encounter (Signed)
Returned patients call to reschedule missed appointment

## 2015-09-12 ENCOUNTER — Telehealth: Payer: Self-pay | Admitting: Nurse Practitioner

## 2015-09-12 ENCOUNTER — Ambulatory Visit (HOSPITAL_BASED_OUTPATIENT_CLINIC_OR_DEPARTMENT_OTHER): Payer: Medicare HMO | Admitting: Nurse Practitioner

## 2015-09-12 ENCOUNTER — Other Ambulatory Visit: Payer: Self-pay | Admitting: *Deleted

## 2015-09-12 ENCOUNTER — Telehealth: Payer: Self-pay | Admitting: Oncology

## 2015-09-12 ENCOUNTER — Ambulatory Visit (HOSPITAL_BASED_OUTPATIENT_CLINIC_OR_DEPARTMENT_OTHER): Payer: Medicare HMO

## 2015-09-12 ENCOUNTER — Encounter: Payer: Self-pay | Admitting: Nurse Practitioner

## 2015-09-12 ENCOUNTER — Other Ambulatory Visit (HOSPITAL_BASED_OUTPATIENT_CLINIC_OR_DEPARTMENT_OTHER): Payer: Medicare HMO

## 2015-09-12 VITALS — BP 150/89 | HR 82 | Temp 97.8°F | Resp 20

## 2015-09-12 VITALS — BP 142/80 | HR 105 | Temp 98.1°F | Resp 18 | Ht 60.0 in | Wt 158.4 lb

## 2015-09-12 DIAGNOSIS — N951 Menopausal and female climacteric states: Secondary | ICD-10-CM

## 2015-09-12 DIAGNOSIS — M2550 Pain in unspecified joint: Secondary | ICD-10-CM

## 2015-09-12 DIAGNOSIS — C50811 Malignant neoplasm of overlapping sites of right female breast: Secondary | ICD-10-CM

## 2015-09-12 DIAGNOSIS — T451X5A Adverse effect of antineoplastic and immunosuppressive drugs, initial encounter: Secondary | ICD-10-CM

## 2015-09-12 DIAGNOSIS — C50311 Malignant neoplasm of lower-inner quadrant of right female breast: Secondary | ICD-10-CM

## 2015-09-12 DIAGNOSIS — G620 Drug-induced polyneuropathy: Secondary | ICD-10-CM

## 2015-09-12 DIAGNOSIS — R200 Anesthesia of skin: Secondary | ICD-10-CM

## 2015-09-12 DIAGNOSIS — M810 Age-related osteoporosis without current pathological fracture: Secondary | ICD-10-CM

## 2015-09-12 DIAGNOSIS — Z23 Encounter for immunization: Secondary | ICD-10-CM

## 2015-09-12 DIAGNOSIS — Z170 Estrogen receptor positive status [ER+]: Secondary | ICD-10-CM

## 2015-09-12 DIAGNOSIS — M069 Rheumatoid arthritis, unspecified: Secondary | ICD-10-CM

## 2015-09-12 DIAGNOSIS — R202 Paresthesia of skin: Secondary | ICD-10-CM

## 2015-09-12 LAB — CBC WITH DIFFERENTIAL/PLATELET
BASO%: 0.4 % (ref 0.0–2.0)
BASOS ABS: 0 10*3/uL (ref 0.0–0.1)
EOS%: 0.3 % (ref 0.0–7.0)
Eosinophils Absolute: 0 10*3/uL (ref 0.0–0.5)
HCT: 43 % (ref 34.8–46.6)
HGB: 13.5 g/dL (ref 11.6–15.9)
LYMPH%: 18.9 % (ref 14.0–49.7)
MCH: 26.5 pg (ref 25.1–34.0)
MCHC: 31.5 g/dL (ref 31.5–36.0)
MCV: 84.4 fL (ref 79.5–101.0)
MONO#: 0.8 10*3/uL (ref 0.1–0.9)
MONO%: 6.7 % (ref 0.0–14.0)
NEUT%: 73.7 % (ref 38.4–76.8)
NEUTROS ABS: 8.3 10*3/uL — AB (ref 1.5–6.5)
PLATELETS: 177 10*3/uL (ref 145–400)
RBC: 5.09 10*6/uL (ref 3.70–5.45)
RDW: 16.6 % — ABNORMAL HIGH (ref 11.2–14.5)
WBC: 11.2 10*3/uL — AB (ref 3.9–10.3)
lymph#: 2.1 10*3/uL (ref 0.9–3.3)

## 2015-09-12 LAB — COMPREHENSIVE METABOLIC PANEL (CC13)
ALT: 10 U/L (ref 0–55)
AST: 15 U/L (ref 5–34)
Albumin: 3.7 g/dL (ref 3.5–5.0)
Alkaline Phosphatase: 112 U/L (ref 40–150)
Anion Gap: 8 mEq/L (ref 3–11)
BUN: 17.6 mg/dL (ref 7.0–26.0)
CHLORIDE: 105 meq/L (ref 98–109)
CO2: 28 meq/L (ref 22–29)
Calcium: 9.5 mg/dL (ref 8.4–10.4)
Creatinine: 1.1 mg/dL (ref 0.6–1.1)
EGFR: 63 mL/min/{1.73_m2} — ABNORMAL LOW (ref >90)
Glucose: 80 mg/dl (ref 70–140)
POTASSIUM: 4 meq/L (ref 3.5–5.1)
SODIUM: 142 meq/L (ref 136–145)
Total Bilirubin: 0.38 mg/dL (ref 0.20–1.20)
Total Protein: 7.8 g/dL (ref 6.4–8.3)

## 2015-09-12 MED ORDER — DENOSUMAB 60 MG/ML ~~LOC~~ SOLN
60.0000 mg | Freq: Once | SUBCUTANEOUS | Status: AC
  Administered 2015-09-12: 60 mg via SUBCUTANEOUS
  Filled 2015-09-12: qty 1

## 2015-09-12 MED ORDER — GABAPENTIN 300 MG PO CAPS: 300.0000 mg | ORAL_CAPSULE | Freq: Three times a day (TID) | ORAL | Status: DC

## 2015-09-12 MED ORDER — TRAMADOL HCL 50 MG PO TABS: 50.0000 mg | ORAL_TABLET | Freq: Three times a day (TID) | ORAL | Status: DC | PRN

## 2015-09-12 MED ORDER — INFLUENZA VAC SPLIT QUAD 0.5 ML IM SUSY
0.5000 mL | PREFILLED_SYRINGE | Freq: Once | INTRAMUSCULAR | Status: AC
  Administered 2015-09-12: 0.5 mL via INTRAMUSCULAR
  Filled 2015-09-12: qty 0.5

## 2015-09-12 MED ORDER — CYCLOBENZAPRINE HCL 10 MG PO TABS: 10.0000 mg | ORAL_TABLET | Freq: Three times a day (TID) | ORAL | Status: DC | PRN

## 2015-09-12 NOTE — Telephone Encounter (Signed)
Gave and printed appt sched and avs fo rpt for May 2017 °

## 2015-09-12 NOTE — Patient Instructions (Signed)
Denosumab injection What is this medicine? DENOSUMAB (den oh sue mab) slows bone breakdown. Prolia is used to treat osteoporosis in women after menopause and in men. Xgeva is used to prevent bone fractures and other bone problems caused by cancer bone metastases. Xgeva is also used to treat giant cell tumor of the bone. This medicine may be used for other purposes; ask your health care provider or pharmacist if you have questions. COMMON BRAND NAME(S): Prolia, XGEVA What should I tell my health care provider before I take this medicine? They need to know if you have any of these conditions: -dental disease -eczema -infection or history of infections -kidney disease or on dialysis -low blood calcium or vitamin D -malabsorption syndrome -scheduled to have surgery or tooth extraction -taking medicine that contains denosumab -thyroid or parathyroid disease -an unusual reaction to denosumab, other medicines, foods, dyes, or preservatives -pregnant or trying to get pregnant -breast-feeding How should I use this medicine? This medicine is for injection under the skin. It is given by a health care professional in a hospital or clinic setting. If you are getting Prolia, a special MedGuide will be given to you by the pharmacist with each prescription and refill. Be sure to read this information carefully each time. For Prolia, talk to your pediatrician regarding the use of this medicine in children. Special care may be needed. For Xgeva, talk to your pediatrician regarding the use of this medicine in children. While this drug may be prescribed for children as young as 13 years for selected conditions, precautions do apply. Overdosage: If you think you've taken too much of this medicine contact a poison control center or emergency room at once. Overdosage: If you think you have taken too much of this medicine contact a poison control center or emergency room at once. NOTE: This medicine is only for  you. Do not share this medicine with others. What if I miss a dose? It is important not to miss your dose. Call your doctor or health care professional if you are unable to keep an appointment. What may interact with this medicine? Do not take this medicine with any of the following medications: -other medicines containing denosumab This medicine may also interact with the following medications: -medicines that suppress the immune system -medicines that treat cancer -steroid medicines like prednisone or cortisone This list may not describe all possible interactions. Give your health care provider a list of all the medicines, herbs, non-prescription drugs, or dietary supplements you use. Also tell them if you smoke, drink alcohol, or use illegal drugs. Some items may interact with your medicine. What should I watch for while using this medicine? Visit your doctor or health care professional for regular checks on your progress. Your doctor or health care professional may order blood tests and other tests to see how you are doing. Call your doctor or health care professional if you get a cold or other infection while receiving this medicine. Do not treat yourself. This medicine may decrease your body's ability to fight infection. You should make sure you get enough calcium and vitamin D while you are taking this medicine, unless your doctor tells you not to. Discuss the foods you eat and the vitamins you take with your health care professional. See your dentist regularly. Brush and floss your teeth as directed. Before you have any dental work done, tell your dentist you are receiving this medicine. Do not become pregnant while taking this medicine or for 5 months after stopping   it. Women should inform their doctor if they wish to become pregnant or think they might be pregnant. There is a potential for serious side effects to an unborn child. Talk to your health care professional or pharmacist for more  information. What side effects may I notice from receiving this medicine? Side effects that you should report to your doctor or health care professional as soon as possible: -allergic reactions like skin rash, itching or hives, swelling of the face, lips, or tongue -breathing problems -chest pain -fast, irregular heartbeat -feeling faint or lightheaded, falls -fever, chills, or any other sign of infection -muscle spasms, tightening, or twitches -numbness or tingling -skin blisters or bumps, or is dry, peels, or red -slow healing or unexplained pain in the mouth or jaw -unusual bleeding or bruising Side effects that usually do not require medical attention (Report these to your doctor or health care professional if they continue or are bothersome.): -muscle pain -stomach upset, gas This list may not describe all possible side effects. Call your doctor for medical advice about side effects. You may report side effects to FDA at 1-800-FDA-1088. Where should I keep my medicine? This medicine is only given in a clinic, doctor's office, or other health care setting and will not be stored at home. NOTE: This sheet is a summary. It may not cover all possible information. If you have questions about this medicine, talk to your doctor, pharmacist, or health care provider.  2015, Elsevier/Gold Standard. (2012-03-29 12:37:47)  

## 2015-09-12 NOTE — Progress Notes (Signed)
[facility]  [phone] Fax:(336) [phone]     ID: Angela Gross OB: 10-12-1951  MR#: [id number]  HKV#:[id number]  PCP: Salena Saner., MD GYN:   SUFanny Skates OTHER MD: Angela Gross, Angela Gross, Angela Gross  CHIEF COMPLAINT:  Right Breast Cancer, triple positive CURRENT TREATMENT:  anastrozole  BREAST CANCER HISTORY: From the original intake note:  Angela Gross had routine screening mammography at the breast center 09/21/2013 showing a possible mass in calcifications in the right breast. On 10/11/2013 she underwent right diagnostic mammography and ultrasonography. This confirmed a spiculated 1.2 cm mass in the central right breast, with a group of pleomorphic calcifications slightly laterally. The calcifications spanning 1.6 cm. A second group of calcifications extended inferiorly and spanned 8 mm. The entire area in aggregate measured 6.5 cm. By physical exam there was no palpable abnormality. Ultrasonography showed an irregular hypoechoic mass at the 6:00 position of the right breast measuring 1 cm maximally. The right axilla was normal.  Biopsy of the right breast mass in question as well as the more lateral area of calcifications on 10/11/2013 showed ([id number]) most to be positive for an invasive ductal carcinoma, both estrogen receptor 85-90% positive with strong staining intensity, both progesterone receptor negative, with an MIB-1 of 17%. HER-2 was amplified, with a signals ratio of 3.44 and a copy number per cell 04 0.30.  Bilateral breast MRI 10/19/2013 showed again a spiculated mass in the 6:00 region of the right breast measuring 1.2 cm, a slightly more lateral hematoma associated with a second biopsy and also with clumped enhancement, and asymmetric none masslike enhancement in most of the right breast, the entire area of abnormality measuring 9.0 cm. The left breast was unremarkable and there were no abnormal appearing lymph  nodes.  Her subsequent history is as detailed below   INTERVAL HISTORY: Angela Gross returns for follow up of her breast cancer. She palpated a mass to her right breast (implant) 2 weeks ago but was unable to get in touch with Angela Gross office, so she just waited for this appointment to approach. She denies pain or swelling. Also new since her last visit is increased neuropathy symptoms despite 312m gabapentin TID. The numbness affects her bilateral feet up through the calves. She also has full body tingling when she lays down to rest. She has to try multiple positions until she finds one that does not trigger this. She continues on tramadol and flexeril for pain. She was recently placed on methotrexate injection for her rheumatoid arthritis, and of course has been bundling up from the cold because of her raynaud's syndrome.   REVIEW OF SYSTEMS: Otherwise a detailed review of systems today was stable, except where noted above.   PAST MEDICAL HISTORY: Past Medical History  Diagnosis Date  . Raynaud disease   . Osteoporosis   . Hypertension   . PONV (postoperative nausea and vomiting)   . Wears glasses   . Full dentures   . Fibromyalgia   . Rheumatoid arthritis     "elbows; fingers; toes"  . Osteoarthritis of both knees   . Degenerative disc disease, cervical   . Chronic left shoulder pain     "work related injury" (01/10/2014)  . Breast cancer     "right" (01/10/2014)  . Type II diabetes mellitus     has not taken meds in over a month (01/10/2014)  . Syncope and collapse     "4 times in the last year" (01/10/2014)  . Anxiety  PAST SURGICAL HISTORY: Past Surgical History  Procedure Laterality Date  . Colonoscopy    . Mastectomy w/ sentinel node biopsy Right 12/13/2013    Procedure: RIGHT TOTAL MASTECTOMY WITH SENTINEL LYMPH NODE BIOPSY;  Surgeon: Angela Hector, MD;  Location: [facility];  Service: General;  Laterality: Right;  . Portacath placement Right  12/13/2013    Procedure: INSERTION PORT-A-CATH;  Surgeon: Angela Hector, MD;  Location: [facility];  Service: General;  Laterality: Right;  . Breast reconstruction with placement of tissue expander and flex hd (acellular hydrated dermis) Right 12/13/2013    Procedure: RIGHT BREAST RECONSTRUCTION WITH PLACEMENT OF TISSUE EXPANDER AND FLEX HD (ACELLULAR HYDRATED DERMIS);  Surgeon: Angela Limbo, MD;  Location: [facility];  Service: Plastics;  Laterality: Right;  . Lipoma excision Bilateral 12/13/2013    Procedure: EXCISION OF LEFT BREAST KELOID AND RIGHT CHEST KELOID;  Surgeon: Angela Limbo, MD;  Location: [facility];  Service: Plastics;  Laterality: Bilateral;  . Mastectomy    . Cholecystectomy  1970's  . Vaginal hysterectomy  1980    no salpingo-oophoretomu  . Breast biopsy Right 10/2013  . Tissue expander placement Right 01/10/2014    Procedure: Incision and Drainage Right Breast Seroma with TISSUE EXPANDER exchange;  Surgeon: Angela Limbo, MD;  Location: [facility];  Service: Plastics;  Laterality: Right;  . Removal of bilateral tissue expanders with placement of bilateral breast implants Bilateral 09/26/2014    Procedure: REMOVAL OF RIGHT TISSUE EXPANDER WITH PLACEMENT OF RIGHT BREAST IMPLANT LIPOFILLING TO RIGHT BREAST/LEFT BREAST MASTOPEXY FOR SYMMETRY;  Surgeon: Angela Limbo, MD;  Location: [facility];  Service: Plastics;  Laterality: Bilateral;  . Liposuction with lipofilling Right 09/26/2014    Procedure: LIPOSUCTION WITH LIPOFILLING;  Surgeon: Angela Limbo, MD;  Location: [facility];  Service: Plastics;  Laterality: Right;  . Mastopexy Left 09/26/2014    Procedure: MASTOPEXY;  Surgeon: Angela Limbo, MD;  Location: [facility];  Service: Plastics;  Laterality: Left;  . Breast reconstruction Right 12/12/2014    Procedure: REVISION OF RIGHT RECONSTRUCTIVE BREAST WITH CAPSULORRHAPHY PLACEMENT  OF SILCONE IMPLANTS AND LIPOFILLING TO RIGHT CHEST ;  Surgeon: Angela Limbo, MD;  Location: [facility] [place];  Service: Plastics;  Laterality: Right;  . Liposuction with lipofilling Right 12/12/2014    Procedure: LIPOSUCTION WITH LIPOFILLING;  Surgeon: Angela Limbo, MD;  Location: [facility];  Service: Plastics;  Laterality: Right;  . Breast reconstruction Right 03/06/2015    Procedure: RIGHT NIPPLE AEROLA COMPLEX RECONSTRUCTION AND LOCAL FLAP WITH LIPO FILLING TO RIGHT BREAST,REMOVAL RIGHT CHEST PORT;  Surgeon: Angela Limbo, MD;  Location: [facility];  Service: Plastics;  Laterality: Right;  . Liposuction with lipofilling Right 03/06/2015    Procedure: ABDOMINAL LIPOSUCTION WITH LIPOFILLING TO RIGHT BREAST;  Surgeon: Angela Limbo, MD;  Location: [facility];  Service: Plastics;  Laterality: Right;  . Port-a-cath removal Right 03/06/2015    Procedure: REMOVAL PORT-A-CATH;  Surgeon: Angela Limbo, MD;  Location: [facility];  Service: Plastics;  Laterality: Right;    FAMILY HISTORY No family history on file. The patient is little information about her father. Her mother died from a heart attack at the age of 75. She had been diagnosed with breast cancer in her late 22s. The patient had 4 brothers and 4 sisters. There is no other history of breast or ovarian cancer in the family to her knowledge.  GYNECOLOGIC  HISTORY:    (Reviewed 04/04/2014) She does not recall her age of menarche. She underwent hysterectomy in her 83s. She did not receive hormone replacement. First live birth at 89. She was GX P2.  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 She is currently disabled secondary to her rheumatoid arthritis. Her husband Angela Gross is a retired Administrator. He now works part time at [employer]. Son Angela Gross works in [place] as a  Patent attorney. Daughter Angela Gross is a Social worker.    ADVANCED DIRECTIVES: Not in place   HEALTH MAINTENANCE: (Updated 04/04/2014) Social History  Substance Use Topics  . Smoking status: Never Smoker   . Smokeless tobacco: Never Used  . Alcohol Use: No     Colonoscopy: Not on file  PAP: Status post remote hysterectomy  Bone density: At [facility] 01/05/2009 showed osteopenia with a T score of -1.7  Lipid panel: Not on file   Allergies  Allergen Reactions  . Codeine Other (See Comments) and Hives    Altered mental status, numbness  Altered mental status, Numbess  . Diazepam     Other reaction(s): Delusions (intolerance) hallucinations     Current Outpatient Prescriptions  Medication Sig Dispense Refill  . anastrozole (ARIMIDEX) 1 MG tablet Take 1 tablet (1 mg total) by mouth daily. 30 tablet 12  . folic acid (FOLVITE) 1 MG tablet Take 1 mg by mouth daily.    Marland Kitchen gabapentin (NEURONTIN) 300 MG capsule Take 1 capsule (300 mg total) by mouth 3 (three) times daily. For neuropathy and hot flashes 270 capsule 1  . methotrexate (50 MG/ML) 1 G injection Inject 8 mg as directed once a week.     . cyclobenzaprine (FLEXERIL) 10 MG tablet Take 1 tablet (10 mg total) by mouth 3 (three) times daily as needed for muscle spasms. 30 tablet 3  . traMADol (ULTRAM) 50 MG tablet Take 1 tablet (50 mg total) by mouth 3 (three) times daily as needed (pain). 90 tablet 1   No current facility-administered medications for this visit.    OBJECTIVE:  Middle-aged African-American woman in no acute distress Filed Vitals:   09/12/15 1330  BP: 142/80  Pulse: 105  Temp: 98.1 F (36.7 C)  Resp: 18     Body mass index is 30.94 kg/(m^2).    ECOG FS: 0  Filed Weights   09/12/15 1330  Weight: 158 lb 6.4 oz (71.85 kg)    Skin: warm, dry  HEENT: sclerae anicteric, conjunctivae pink, oropharynx clear. No thrush or mucositis.  Lymph Nodes: No cervical or supraclavicular  lymphadenopathy  Lungs: clear to auscultation bilaterally, no rales, wheezes, or rhonci  Heart: regular rate and rhythm  Abdomen: round, soft, non tender, positive bowel sounds  Musculoskeletal: No focal spinal tenderness, no peripheral edema  Neuro: non focal, well oriented, positive affect  Breasts: 2 round, soft, mobile masses palpated to the lateral midline aspect of the right breast, ~1cm each. Edges of implant palpated. Right axilla benign. Left breast unremarkable.  LAB RESULTS:   Lab Results  Component Value Date   WBC 11.2* 09/12/2015   NEUTROABS 8.3* 09/12/2015   HGB 13.5 09/12/2015   HCT 43.0 09/12/2015   MCV 84.4 09/12/2015   PLT 177 09/12/2015      Chemistry      Component Value Date/Time   NA 142 09/12/2015 1245   NA 138 01/09/2014 1522  K 4.0 09/12/2015 1245   K 4.2 01/09/2014 1522   CL 100 01/09/2014 1522   CO2 28 09/12/2015 1245   CO2 25 01/09/2014 1522   BUN 17.6 09/12/2015 1245   BUN 11 01/09/2014 1522   CREATININE 1.1 09/12/2015 1245   CREATININE 0.99 01/09/2014 1522      Component Value Date/Time   CALCIUM 9.5 09/12/2015 1245   CALCIUM 8.5 01/09/2014 1522   ALKPHOS 112 09/12/2015 1245   ALKPHOS 102 12/09/2013 1400   AST 15 09/12/2015 1245   AST 36 12/09/2013 1400   ALT 10 09/12/2015 1245   ALT 30 12/09/2013 1400   BILITOT 0.38 09/12/2015 1245   BILITOT 0.2* 12/09/2013 1400     STUDIES: No results found.   ASSESSMENT: 64 y.o. [place] woman   (1)  s/p biopsy of separate Right breast masses 10/11/2013 for a clinical mT1 N0, stage IA invasive ductal carcinoma, grade II-III, one of the mass is being 85% estrogen receptor positive, progesterone receptor negative, with an MIB-1 of 17% and HER-2 amplified.  (2) status post right mastectomy and sentinel lymph node sampling with immediate expander placement 12/13/2013 for a pT1c pN0, stage IA invasive ductal carcinoma, grade 3, again HER-2 positive.  (3) treated in the adjuvant setting with  carboplatin, docetaxel, trastuzumab and pertuzumab, first dose on 02/21/2014   (a). Patient developed significant peripheral neuropathy after one cycle of chemotherapy, and both carboplatin and docetaxel were held beginning with cycle 2. Continued pertuzumab/ trastuzumab x 5 more cycles, completed 08/01/2014  (b)  single agent trastuzumab to be started 08/29/2014, to be continued every 3 weeks until May 2016  (d) most recent echocardiogram 01/08/2015 showed a well preserved ejection fraction  (4) started on anastrozole on 04/04/2014 .   (a) DEXA scan favor 2720 15 shows osteoporosis, with the lowest T score at -2.49  (b) denosumab/Proluia to start 03/06/2015, repeated every 6 months while on anastrozole     (5) Rheumatoid arthritis,  remicade and lyrica held until completion of trastuzumab. Controlled with oxycodone PRN  (6) chemotherapy-induced peripheral neuropathy  - ongabapentin, 300 BID, and tramadol, 50 mg every 6 hours as needed.    (7)  Implant reconstruction  right breast3/1/16   PLAN: Angela Gross's physical exam was concerning for possibly a cyst or ruptured implant. I have written orders for a right breast MRI to evaluate further. Depending on the results, she will need a referral back to Angela Gross office.  As far as her neuropathy symptoms are concerned, this is unlikely to be due to chemotherapy as she has not be treated with the offending drug in over 18 months. The neuropathy affects multiple parts of the body and is positional. I have made a referral to Dr. Rhea Gross office for a neurological consultation visit. In the meantime, she will continue gabapentin 382m TID. She can take a second tablet to make it 6013mat night if she chooses since the symptoms seem to be worse then.   Angela Gross have her prolia injection today and continue those every 6 months. If her breast MRI does not return any malignant results, she will return in 6 months for follow up with Dr. MaJana HakimShe  will continue on anastrozole daily which she tolerates relatively well. She understands and agrees with this plan. She knows the goal of treatment in her case is cure. She has been encouraged to call with any issues that might arise before her next visit here.  HeLaurie PandaNP  09/12/2015 2:25  PM

## 2015-09-12 NOTE — Telephone Encounter (Signed)
Neuro referral noted and she is with [facility] and they will call the patient

## 2015-09-18 ENCOUNTER — Ambulatory Visit (INDEPENDENT_AMBULATORY_CARE_PROVIDER_SITE_OTHER): Payer: Medicare HMO | Admitting: Neurology

## 2015-09-18 ENCOUNTER — Encounter: Payer: Self-pay | Admitting: Neurology

## 2015-09-18 VITALS — BP 138/92 | HR 103 | Ht 60.0 in | Wt 163.0 lb

## 2015-09-18 DIAGNOSIS — T451X5A Adverse effect of antineoplastic and immunosuppressive drugs, initial encounter: Secondary | ICD-10-CM

## 2015-09-18 DIAGNOSIS — Z853 Personal history of malignant neoplasm of breast: Secondary | ICD-10-CM

## 2015-09-18 DIAGNOSIS — G620 Drug-induced polyneuropathy: Secondary | ICD-10-CM | POA: Diagnosis not present

## 2015-09-18 NOTE — Progress Notes (Signed)
PATIENT: Angela Gross DOB: 1951/07/21  Chief Complaint  Patient presents with  . Peripheral Neuropathy    She is having tingling, numbness and pain in her feet and toes.  She is also having numbness in her arms and hands.  Symptoms are making her feel restless at night and she has been unable to sleep.      HISTORICAL  Angela Gross is a 64 years old right-handed female, seen in refer by her oncologist Chauncey Cruel, MD, her primary care physician is Dr. 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  I have reviewed and summarized her oncology notes from Dr. Jana Hakim, she complains of bilateral feet paresthesia bilateral fingertips sensitivity since her chemotherapy, She was initially treated with adjuvant treatment with carboplatin, docetaxel, trastuzumab and pertuzumab, first dose on 02/21/2014, she developed  significant peripheral neuropathy after one cycle of chemotherapy, and both carboplatin and docetaxel were held beginning with cycle 2. She was treated with pertuzumab/ trastuzumab x 5 more cycles, completed 08/01/2014, then single agent trastuzumab to be started 08/29/2014, to be continued every 3 weeks until May  2016  Despite complete chemotherapy in May 2016, she noticed continued progression of her bilateral feet lower extremity paresthesia, now it has ascended to mid shin level, she complains constant burning pain to bilateral lower extremity, fingertips, getting worse after bearing weight, she is now taking gabapentin 300 mg 5 tablets each day, which does help her symptoms some, she also complains of frequent bilateral calf muscle cramping, low back pain, no radiating pain,  She has neck pain, unsteady gait due to feet pain, she denies bowel and bladder incontinence  REVIEW OF SYSTEMS: Full 14 system review of systems performed and notable only for feeling hot, feeling cold, increased thirst, flushing, joint pain, cramps, achy muscles, allergy, constipation, headaches, numbness, insomnia, snoring, restless legs  ALLERGIES: Allergies  Allergen Reactions  . Codeine Other (See Comments) and Hives    Altered mental status, numbness  Altered mental status, Numbess  . Diazepam     Other reaction(s): Delusions (intolerance) hallucinations     HOME MEDICATIONS: Current Outpatient Prescriptions  Medication Sig Dispense Refill  . anastrozole (ARIMIDEX) 1 MG tablet Take 1 tablet (1 mg total) by mouth daily. 30 tablet 12  . cyclobenzaprine (FLEXERIL) 10 MG tablet Take 1 tablet (10 mg total) by mouth 3 (three) times daily as needed for muscle spasms. 30 tablet 3  . folic acid (FOLVITE) 1 MG tablet Take 1 mg by mouth daily.    Marland Kitchen gabapentin (NEURONTIN) 300 MG capsule Take 1 capsule (300 mg total) by mouth 3 (three) times daily. For neuropathy and hot flashes 270 capsule 1  . methotrexate (50 MG/ML) 1 G injection Inject 8 mg as directed once a week.     Marland Kitchen  traMADol (ULTRAM) 50 MG tablet Take 1 tablet (50 mg total) by mouth 3 (three) times daily as needed (pain). 90 tablet 1   No current facility-administered medications for this visit.    PAST MEDICAL HISTORY: Past Medical History  Diagnosis Date  .  Raynaud disease   . Osteoporosis   . Hypertension   . PONV (postoperative nausea and vomiting)   . Wears glasses   . Full dentures   . Fibromyalgia   . Rheumatoid arthritis ([place])     "elbows; fingers; toes"  . Osteoarthritis of both knees   . Degenerative disc disease, cervical   . Chronic left shoulder pain     "work related injury" (01/10/2014)  . Breast cancer ([place])     "right" (01/10/2014)  . Type II diabetes mellitus ([facility])     has not taken meds in over a month (01/10/2014)  . Syncope and collapse     "4 times in the last year" (01/10/2014)  . Anxiety     PAST SURGICAL HISTORY: Past Surgical History  Procedure Laterality Date  . Colonoscopy    . Mastectomy w/ sentinel node biopsy Right 12/13/2013    Procedure: RIGHT TOTAL MASTECTOMY WITH SENTINEL LYMPH NODE BIOPSY;  Surgeon: Adin Hector, MD;  Location: [facility];  Service: General;  Laterality: Right;  . Portacath placement Right 12/13/2013    Procedure: INSERTION PORT-A-CATH;  Surgeon: Adin Hector, MD;  Location: [facility];  Service: General;  Laterality: Right;  . Breast reconstruction with placement of tissue expander and flex hd (acellular hydrated dermis) Right 12/13/2013    Procedure: RIGHT BREAST RECONSTRUCTION WITH PLACEMENT OF TISSUE EXPANDER AND FLEX HD (ACELLULAR HYDRATED DERMIS);  Surgeon: Irene Limbo, MD;  Location: [facility];  Service: Plastics;  Laterality: Right;  . Lipoma excision Bilateral 12/13/2013    Procedure: EXCISION OF LEFT BREAST KELOID AND RIGHT CHEST KELOID;  Surgeon: Irene Limbo, MD;  Location: [place];  Service: Plastics;  Laterality: Bilateral;  . Mastectomy    . Cholecystectomy  1970's  . Vaginal hysterectomy  1980    no salpingo-oophoretomu  . Breast biopsy Right 10/2013  . Tissue expander placement Right 01/10/2014    Procedure: Incision and Drainage Right Breast Seroma with TISSUE EXPANDER exchange;  Surgeon: Irene Limbo, MD;  Location: [facility];  Service: Plastics;  Laterality: Right;  . Removal of bilateral tissue expanders with placement of bilateral breast implants Bilateral 09/26/2014    Procedure: REMOVAL OF RIGHT TISSUE EXPANDER WITH PLACEMENT OF RIGHT BREAST IMPLANT LIPOFILLING TO RIGHT BREAST/LEFT BREAST MASTOPEXY FOR SYMMETRY;  Surgeon: Irene Limbo, MD;  Location: [place];  Service: Plastics;  Laterality: Bilateral;  . Liposuction with lipofilling Right 09/26/2014    Procedure: LIPOSUCTION WITH LIPOFILLING;  Surgeon: Irene Limbo, MD;  Location: [place];  Service: Plastics;  Laterality: Right;  . Mastopexy Left 09/26/2014    Procedure: MASTOPEXY;  Surgeon: Irene Limbo, MD;  Location: [facility] [place];  Service: Plastics;  Laterality: Left;  . Breast reconstruction Right 12/12/2014    Procedure: REVISION OF RIGHT RECONSTRUCTIVE BREAST WITH CAPSULORRHAPHY PLACEMENT OF SILCONE IMPLANTS AND LIPOFILLING TO RIGHT CHEST ;  Surgeon: Irene Limbo, MD;  Location: [place];  Service: Plastics;  Laterality: Right;  . Liposuction with lipofilling Right 12/12/2014    Procedure: LIPOSUCTION WITH LIPOFILLING;  Surgeon: Irene Limbo, MD;  Location: [facility];  Service: Plastics;  Laterality: Right;  .  Breast reconstruction Right 03/06/2015    Procedure: RIGHT NIPPLE AEROLA COMPLEX RECONSTRUCTION AND LOCAL FLAP WITH LIPO FILLING TO RIGHT BREAST,REMOVAL RIGHT CHEST PORT;  Surgeon: Irene Limbo, MD;  Location: [facility];  Service: Plastics;  Laterality: Right;  . Liposuction with lipofilling Right 03/06/2015    Procedure: ABDOMINAL LIPOSUCTION WITH LIPOFILLING TO RIGHT BREAST;  Surgeon: Irene Limbo, MD;  Location: [facility];  Service: Plastics;  Laterality: Right;  . Port-a-cath removal Right 03/06/2015    Procedure: REMOVAL PORT-A-CATH;  Surgeon: Irene Limbo, MD;  Location: [place];  Service: Plastics;  Laterality: Right;    FAMILY HISTORY: Family History  Problem Relation Age of Onset  . Heart disease Mother   . Hypertension Mother   . Breast cancer Mother   . Diabetes Mother   . Congestive Heart Failure Mother   . Glaucoma Mother     SOCIAL HISTORY:  Social History   Social History  . Marital Status: Married    Spouse Name: N/A  . Number of Children: 2  . Years of Education: HS   Occupational History  . Unemployed    Social History Main Topics  . Smoking status: Never Smoker   . Smokeless tobacco: Never Used  . Alcohol Use: No  . Drug Use: No  . Sexual Activity: Not Currently   Other Topics Concern  . Not on file   Social History Narrative   Lives at home with husband.   Right-handed.   No caffeine use.     PHYSICAL EXAM   Filed Vitals:   09/18/15 1313  BP: 138/92  Pulse: 103  Height: 5' (1.524 m)  Weight: 163 lb (73.936 kg)    Not recorded      Body mass index is 31.83 kg/(m^2).  PHYSICAL EXAMNIATION:  Gen: NAD, conversant, well nourised, obese, well groomed                     Cardiovascular: Regular rate rhythm, no peripheral edema, warm, nontender. Eyes: Conjunctivae clear without exudates or hemorrhage Neck: Supple, no carotid bruise. Pulmonary: Clear to auscultation bilaterally   NEUROLOGICAL EXAM:  MENTAL STATUS: Speech:    Speech is normal; fluent and spontaneous with normal comprehension.  Cognition:     Orientation to time, place and person     Normal recent and remote memory     Normal Attention span and concentration     Normal Language, naming, repeating,spontaneous speech     Fund of knowledge   CRANIAL NERVES: CN II: Visual fields are full to confrontation. Fundoscopic exam is normal with sharp discs and no vascular changes. Pupils are round equal and briskly reactive to light. CN III, IV, VI: extraocular movement are normal. No ptosis. CN V: Facial sensation is intact to  pinprick in all 3 divisions bilaterally. Corneal responses are intact.  CN VII: Face is symmetric with normal eye closure and smile. CN VIII: Hearing is normal to rubbing fingers CN IX, X: Palate elevates symmetrically. Phonation is normal. CN XI: Head turning and shoulder shrug are intact CN XII: Tongue is midline with normal movements and no atrophy.  MOTOR: There is no pronator drift of out-stretched arms. Muscle bulk and tone are normal. Muscle strength is normal.  REFLEXES: Reflexes are 1 and symmetric at the biceps, triceps, knees, and absent at ankles. Plantar responses are flexor.  SENSORY: Length dependent decreased to light touch, and vibration sensation to midshin level  COORDINATION: Rapid alternating  movements and fine finger movements are intact. There is no dysmetria on finger-to-nose and heel-knee-shin.    GAIT/STANCE: Mildly antalgic, cautious, able to walk on tiptoe heel walking, unsteady tandem walking   DIAGNOSTIC DATA (LABS, IMAGING, TESTING) - I reviewed patient records, labs, notes, testing and imaging myself where available.   ASSESSMENT AND PLAN  Angela Gross is a 64 y.o. female   Bilateral feet paresthesia  Differentiation diagnosis includes peripheral neuropathy, need to rule out cervical spondylitic myelopathy with her history of rheumatoid arthritis  Laboratory evaluations  EMG nerve conduction study  MRI of cervical spine  Compounding cream   Marcial Pacas, M.D. Ph.D.  [facility] [address], [place], [place] [address] Ph: [phone] Fax: [phone]   CC: Chauncey Cruel, MD, her primary care physician is Dr. Helane Rima, MD

## 2015-09-18 NOTE — Patient Instructions (Signed)
Electroencephalogram, Adult  An electroencephalogram (EEG) is a test that records electrical activity in the brain. It is often used to diagnose or monitor problems that are related to the brain, such as:  · Seizure disorders.  · Sleeping problems.  · Changes in behavior.  LET YOUR HEALTH CARE PROVIDER KNOW ABOUT:  · Any allergies you have.  · All medicines you are taking, including vitamins, herbs, eye drops, creams, and over-the-counter medicines.  · Previous problems you or members of your family have had with the use of anesthetics.  · Any blood disorders you have.  · Previous surgeries you have had.  · Any medical conditions you may have, including psychiatric conditions.  · Any history of illegal drug use or alcohol abuse.  RISKS AND COMPLICATIONS  Generally, this is a safe test. If you have a seizure disorder, you may be made to have a seizure during the test. This is done so that your brain activity can be recorded during the seizure.  BEFORE THE PROCEDURE  · Arrive with your hair clean and dry. Do not tease your hair, and do not put hair spray or oil in your hair.  · Do not have any caffeine during the 4 hours before your test.  · Follow instructions from your health care provider about sleeping, eating, or taking medicines before the test.  PROCEDURE  You will be asked to sit in a chair or lie down. Small metal discs (electrodes) will be attached to your head with an adhesive. These electrodes will pick up on the signals in your brain, and a machine will record the signals. During the test you may be asked to:  · Sit or lie quietly and relax.  · Open and close your eyes.  · Breathe deeply for three minutes.  · Look at a flashing light for a short period of time.  · Go to sleep.  When the test is complete, the electrodes will be removed by using a solution such as acetone or fingernail polish remover.  AFTER THE PROCEDURE  It is your responsibility to get your test results. Ask the lab or department  performing the test when and how you will get your results.     This information is not intended to replace advice given to you by your health care provider. Make sure you discuss any questions you have with your health care provider.     Document Released: 09/26/2000 Document Revised: 02/13/2015 Document Reviewed: 09/06/2014  Elsevier Interactive Patient Education ©2016 Elsevier Inc.

## 2015-09-19 ENCOUNTER — Other Ambulatory Visit: Payer: Self-pay | Admitting: Plastic Surgery

## 2015-09-19 DIAGNOSIS — N6310 Unspecified lump in the right breast, unspecified quadrant: Secondary | ICD-10-CM

## 2015-09-20 ENCOUNTER — Encounter: Payer: Self-pay | Admitting: *Deleted

## 2015-09-20 ENCOUNTER — Telehealth: Payer: Self-pay | Admitting: Neurology

## 2015-09-20 LAB — THYROID PANEL WITH TSH
FREE THYROXINE INDEX: 2.2 (ref 1.2–4.9)
T3 Uptake Ratio: 26 % (ref 24–39)
T4, Total: 8.4 ug/dL (ref 4.5–12.0)
TSH: 0.771 u[IU]/mL (ref 0.450–4.500)

## 2015-09-20 LAB — HEPATITIS PANEL, ACUTE
HEP A IGM: NEGATIVE
HEP B C IGM: NEGATIVE
HEP B S AG: NEGATIVE

## 2015-09-20 LAB — SEDIMENTATION RATE: SED RATE: 16 mm/h (ref 0–40)

## 2015-09-20 LAB — PROTEIN ELECTROPHORESIS
A/G RATIO SPE: 1 (ref 0.7–1.7)
ALBUMIN ELP: 3.5 g/dL (ref 2.9–4.4)
Alpha 1: 0.2 g/dL (ref 0.0–0.4)
Alpha 2: 0.8 g/dL (ref 0.4–1.0)
Beta: 1.4 g/dL — ABNORMAL HIGH (ref 0.7–1.3)
GLOBULIN, TOTAL: 3.6 g/dL (ref 2.2–3.9)
Gamma Globulin: 1.2 g/dL (ref 0.4–1.8)
TOTAL PROTEIN: 7.1 g/dL (ref 6.0–8.5)

## 2015-09-20 LAB — FOLATE: Folate: >20 ng/mL (ref >3.0)

## 2015-09-20 LAB — VITAMIN B12: VITAMIN B 12: 274 pg/mL (ref 211–946)

## 2015-09-20 LAB — RPR: RPR Ser Ql: NONREACTIVE

## 2015-09-20 LAB — C-REACTIVE PROTEIN: CRP: 11.8 mg/L — ABNORMAL HIGH (ref 0.0–4.9)

## 2015-09-20 LAB — CK: CK TOTAL: 99 U/L (ref 24–173)

## 2015-09-20 LAB — ANA W/REFLEX IF POSITIVE: Anti Nuclear Antibody(ANA): NEGATIVE

## 2015-09-20 LAB — COPPER, SERUM: COPPER: 132 ug/dL (ref 72–166)

## 2015-09-20 NOTE — Telephone Encounter (Signed)
LVM for pt to call about results. Gave [facility] phone number.  

## 2015-09-20 NOTE — Telephone Encounter (Signed)
Patient returned call, please call [phone].

## 2015-09-20 NOTE — Telephone Encounter (Signed)
Please call patient mild elevated C reactive protein of unknown clinical significance, vitamin B12 level was within low normal limits 274, please advise her to take over-the-counter vitamin B12 by mouth supplement  Rest of the left was normal includes TSH, RPR, folic acid, CPK, ANA

## 2015-09-20 NOTE — Telephone Encounter (Signed)
Attempted call again - left another message.

## 2015-09-20 NOTE — Telephone Encounter (Signed)
Patient aware of results and will start a supplement.

## 2015-09-25 ENCOUNTER — Ambulatory Visit
Admission: RE | Admit: 2015-09-25 | Discharge: 2015-09-25 | Disposition: A | Discharge status: Home / Self Care | Payer: Medicare HMO | Source: Ambulatory Visit | Attending: Plastic Surgery | Admitting: Plastic Surgery

## 2015-09-25 ENCOUNTER — Other Ambulatory Visit: Payer: Self-pay | Admitting: Plastic Surgery

## 2015-09-25 DIAGNOSIS — N6310 Unspecified lump in the right breast, unspecified quadrant: Secondary | ICD-10-CM

## 2015-10-02 ENCOUNTER — Ambulatory Visit
Admission: RE | Admit: 2015-10-02 | Discharge: 2015-10-02 | Disposition: A | Discharge status: Home / Self Care | Payer: Medicare HMO | Source: Ambulatory Visit | Attending: Neurology | Admitting: Neurology

## 2015-10-02 DIAGNOSIS — Z853 Personal history of malignant neoplasm of breast: Secondary | ICD-10-CM

## 2015-10-02 DIAGNOSIS — G620 Drug-induced polyneuropathy: Secondary | ICD-10-CM

## 2015-10-02 DIAGNOSIS — T451X5A Adverse effect of antineoplastic and immunosuppressive drugs, initial encounter: Principal | ICD-10-CM

## 2015-10-09 ENCOUNTER — Telehealth: Payer: Self-pay | Admitting: Neurology

## 2015-10-09 NOTE — Telephone Encounter (Signed)
Please call patient MRI of the cervical showed evidence of multilevel degenerative disc disease most severe at C3 and 4, with evidence some foraminal narrowing, there was some anatomic variations at cervical spine with slightly dilated central canal, but none of the findings would explain her complains of bilateral feet paresthesia,   Mildly abnormal MRI cervical spine (without) demonstrating: 1. At C3-4: disc bulging and uncovertebral joint hypertrophy with severe left foraminal stenosis 2. At C5-6: disc bulging and uncovertebral joint hypertrophy with moderate biforaminal stenosis 3. At C6-7, C7-T1: uncovertebral joint hypertrophy with mild biforaminal stenosis  4. Slightly dilated central canal T2 hyperintense signal (78mm) from C4-5 to C7-T1 level, likely a normal variant.

## 2015-10-09 NOTE — Telephone Encounter (Signed)
I called the patient and explained MRI results, per Dr. Rhea Belton note.

## 2015-10-18 ENCOUNTER — Encounter: Payer: Self-pay | Admitting: *Deleted

## 2015-11-07 ENCOUNTER — Ambulatory Visit (INDEPENDENT_AMBULATORY_CARE_PROVIDER_SITE_OTHER): Payer: Medicare HMO | Admitting: Neurology

## 2015-11-07 ENCOUNTER — Ambulatory Visit (INDEPENDENT_AMBULATORY_CARE_PROVIDER_SITE_OTHER): Payer: Self-pay | Admitting: Neurology

## 2015-11-07 DIAGNOSIS — Z853 Personal history of malignant neoplasm of breast: Secondary | ICD-10-CM

## 2015-11-07 DIAGNOSIS — Z0289 Encounter for other administrative examinations: Secondary | ICD-10-CM

## 2015-11-07 DIAGNOSIS — R202 Paresthesia of skin: Secondary | ICD-10-CM

## 2015-11-07 DIAGNOSIS — G620 Drug-induced polyneuropathy: Secondary | ICD-10-CM

## 2015-11-07 DIAGNOSIS — N951 Menopausal and female climacteric states: Secondary | ICD-10-CM

## 2015-11-07 DIAGNOSIS — T451X5A Adverse effect of antineoplastic and immunosuppressive drugs, initial encounter: Principal | ICD-10-CM

## 2015-11-07 DIAGNOSIS — I427 Cardiomyopathy due to drug and external agent: Secondary | ICD-10-CM

## 2015-11-07 MED ORDER — GABAPENTIN 300 MG PO CAPS: 900.0000 mg | ORAL_CAPSULE | Freq: Three times a day (TID) | ORAL | Status: DC

## 2015-11-07 NOTE — Progress Notes (Signed)
Electrodiagnostic study today showed no evidence of large fiber peripheral neuropathy, or left cervical, left lumbosacral radiculopathy. I have ordered skin biopsy to rule out small fiber neuropathy.  Refilled her gabapentin 300 mg 3 tablets 3 times a day

## 2015-11-07 NOTE — Procedures (Signed)
   NCS (NERVE CONDUCTION STUDY) WITH EMG (ELECTROMYOGRAPHY) REPORT   STUDY DATE: November 07 2015 PATIENT NAME: Angela Gross DOB: [birth year] MRN: [id number]    TECHNOLOGIST: Laretta Alstrom ELECTROMYOGRAPHER: Marcial Pacas M.D.  CLINICAL INFORMATION: 65 years old female, presented with bilateral hand and feet paresthesia following her chemotherapy for breast cancer in 2015  FINDINGS: NERVE CONDUCTION STUDY: Bilateral peroneal sensory responses were absent. Bilateral peroneal to EDB and tibial motor responses were normal. Bilateral tibial H reflexes were normal and symmetric.  Bilateral median, ulnar sensory and motor responses were normal.  NEEDLE ELECTROMYOGRAPHY: Selected needle examination was performed at left upper, lower extremity muscles, left cervical and lumbar spinal paraspinal muscles.  Needle examination of left first dorsal interossei, pronator teres, biceps, triceps, deltoid, brachial radialis was normal.  There was no spontaneous activity at left cervical paraspinal muscles, left C5-6 and 7.  Selected needle examination of left tibialis anterior, tibialis posterior, medial gastrocnemius, peroneal longus, vastus lateralis was normal.  There was no spontaneous activity at left lumbar sacral paraspinal muscles, left L4-5 S1.  IMPRESSION:   This is a normal study, there is no evidence of large fiber peripheral neuropathy, or left cervical, left lumbosacral radiculopathy.   INTERPRETING PHYSICIAN:   Marcial Pacas M.D. Ph.D. [facility] [address] [place], [place] [address] [phone]

## 2015-12-12 ENCOUNTER — Telehealth: Payer: Self-pay | Admitting: *Deleted

## 2015-12-12 NOTE — Telephone Encounter (Signed)
Patient needs refill for Tramadol. Please call once prescription is ready for pick up

## 2015-12-13 ENCOUNTER — Other Ambulatory Visit: Payer: Self-pay | Admitting: Nurse Practitioner

## 2015-12-13 ENCOUNTER — Telehealth: Payer: Self-pay

## 2015-12-13 DIAGNOSIS — C50411 Malignant neoplasm of upper-outer quadrant of right female breast: Secondary | ICD-10-CM

## 2015-12-13 NOTE — Telephone Encounter (Signed)
Patient calling for refill on tramadol to be sent to the [facility].

## 2016-01-04 ENCOUNTER — Other Ambulatory Visit: Payer: Self-pay

## 2016-01-04 DIAGNOSIS — M2550 Pain in unspecified joint: Secondary | ICD-10-CM

## 2016-01-04 MED ORDER — TRAMADOL HCL 50 MG PO TABS: 50.0000 mg | ORAL_TABLET | Freq: Three times a day (TID) | ORAL | Status: DC | PRN

## 2016-01-04 NOTE — Telephone Encounter (Signed)
Patient states that Gentry Fitz, NP called in a refill for her on the tramadol.  Writer noted it in her medications so it could be tracked.

## 2016-02-02 NOTE — H&P (Signed)
  Subjective:    Patient ID: Angela Gross is a 65 y.o. female.  Follow-up   Post right mastectomy and breast reconstruction with implant. Doing well but feels right implant volume greater than left, too small for her bras. Also feels it falls to right/laterally.  Path with negative nodes, 1.8 cm tumor. Er/PR+, Her 2 +. 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   History significant for RA and Raynauds. Angela Gross is her rheumatologist. Held MTX and steroids throughout chemotherapy and no plans to resume.   Mastectomy wt 1194 g Left mastopexy wt 277 g Reports D cup now.        Objective:   Physical Exam   CV: normal heart sounds, normal rate PULM: clear to auscultation bilateral  Right chest: +animation defomity, overall I feel volume close but able to appreciate in bra right is fuller, in supine position few cm of lateral displacement.  Left chest with hypertrophic scar at 9 o clock areola, bottoming out of this breast Right chest port scar with resolution hypertrophic scar but hypopigmentation present  Assessment:     R breast cancer , Herceptin complete Rheumatoid arthritis, Raynauds S/p TE/ADM reconstruction S/p Left breast lift, silicone implant right breast, lipofilling right S/p nipple creation, lipofilling    Plan:      Discussed we have down sized her implant once before,can do this again. With regards to feeling implant is falling off, this has improved with capsulorraphy done in past, permanent sutures utilized. Can try and narrow pocket further but may need additional ADM such as Strattice to assist with this. Plan OP surgery, possible drain. Counseled change in implant may change location of reconstructed NAC on breast mound. Counseled this will not change her animation deformity, we have agreed no further fat grafting.   Mentor Siltex Ultra High Profile 650 ml implant.120 ml fat  grafted to right breast (#1), 110 ml fat infiltrated (#2)  Angela Limbo, MD [facility] [phone]

## 2016-02-05 ENCOUNTER — Encounter (HOSPITAL_BASED_OUTPATIENT_CLINIC_OR_DEPARTMENT_OTHER): Payer: Self-pay | Admitting: *Deleted

## 2016-02-05 ENCOUNTER — Other Ambulatory Visit: Payer: Self-pay | Admitting: Oncology

## 2016-02-05 DIAGNOSIS — Z1231 Encounter for screening mammogram for malignant neoplasm of breast: Secondary | ICD-10-CM

## 2016-02-06 ENCOUNTER — Encounter (HOSPITAL_BASED_OUTPATIENT_CLINIC_OR_DEPARTMENT_OTHER)
Admission: RE | Admit: 2016-02-06 | Discharge: 2016-02-06 | Disposition: A | Discharge status: Home / Self Care | Payer: Medicare HMO | Source: Ambulatory Visit | Attending: Plastic Surgery | Admitting: Plastic Surgery

## 2016-02-06 DIAGNOSIS — M069 Rheumatoid arthritis, unspecified: Secondary | ICD-10-CM | POA: Diagnosis not present

## 2016-02-06 DIAGNOSIS — Z9011 Acquired absence of right breast and nipple: Secondary | ICD-10-CM | POA: Diagnosis not present

## 2016-02-06 DIAGNOSIS — T451X5A Adverse effect of antineoplastic and immunosuppressive drugs, initial encounter: Secondary | ICD-10-CM | POA: Diagnosis not present

## 2016-02-06 DIAGNOSIS — Z79899 Other long term (current) drug therapy: Secondary | ICD-10-CM | POA: Diagnosis not present

## 2016-02-06 DIAGNOSIS — G620 Drug-induced polyneuropathy: Secondary | ICD-10-CM | POA: Diagnosis not present

## 2016-02-06 DIAGNOSIS — T8542XA Displacement of breast prosthesis and implant, initial encounter: Secondary | ICD-10-CM | POA: Diagnosis not present

## 2016-02-06 DIAGNOSIS — N651 Disproportion of reconstructed breast: Secondary | ICD-10-CM | POA: Diagnosis present

## 2016-02-06 DIAGNOSIS — E119 Type 2 diabetes mellitus without complications: Secondary | ICD-10-CM | POA: Diagnosis not present

## 2016-02-06 DIAGNOSIS — Z853 Personal history of malignant neoplasm of breast: Secondary | ICD-10-CM | POA: Diagnosis not present

## 2016-02-06 DIAGNOSIS — Y831 Surgical operation with implant of artificial internal device as the cause of abnormal reaction of the patient, or of later complication, without mention of misadventure at the time of the procedure: Secondary | ICD-10-CM | POA: Diagnosis not present

## 2016-02-06 DIAGNOSIS — Z79811 Long term (current) use of aromatase inhibitors: Secondary | ICD-10-CM | POA: Diagnosis not present

## 2016-02-06 LAB — BASIC METABOLIC PANEL
Anion gap: 9 (ref 5–15)
BUN: 8 mg/dL (ref 6–20)
CO2: 28 mmol/L (ref 22–32)
Calcium: 9.5 mg/dL (ref 8.9–10.3)
Chloride: 105 mmol/L (ref 101–111)
Creatinine, Ser: 0.98 mg/dL (ref 0.44–1.00)
GFR calc Af Amer: >60 mL/min (ref >60)
GLUCOSE: 108 mg/dL — AB (ref 65–99)
POTASSIUM: 4.1 mmol/L (ref 3.5–5.1)
Sodium: 142 mmol/L (ref 135–145)

## 2016-02-06 LAB — HEMOGLOBIN AND HEMATOCRIT, BLOOD
HEMATOCRIT: 38.8 % (ref 36.0–46.0)
Hemoglobin: 12.1 g/dL (ref 12.0–15.0)

## 2016-02-12 ENCOUNTER — Encounter (HOSPITAL_BASED_OUTPATIENT_CLINIC_OR_DEPARTMENT_OTHER): Payer: Self-pay | Admitting: *Deleted

## 2016-02-12 ENCOUNTER — Ambulatory Visit (HOSPITAL_BASED_OUTPATIENT_CLINIC_OR_DEPARTMENT_OTHER): Payer: Medicare HMO | Admitting: Anesthesiology

## 2016-02-12 ENCOUNTER — Ambulatory Visit (HOSPITAL_BASED_OUTPATIENT_CLINIC_OR_DEPARTMENT_OTHER)
Admission: RE | Admit: 2016-02-12 | Discharge: 2016-02-12 | Disposition: A | Discharge status: Home / Self Care | Payer: Medicare HMO | Source: Ambulatory Visit | Attending: Plastic Surgery | Admitting: Plastic Surgery

## 2016-02-12 ENCOUNTER — Encounter (HOSPITAL_BASED_OUTPATIENT_CLINIC_OR_DEPARTMENT_OTHER)
Admission: RE | Disposition: A | Discharge status: Home / Self Care | Payer: Self-pay | Source: Ambulatory Visit | Attending: Plastic Surgery

## 2016-02-12 DIAGNOSIS — M069 Rheumatoid arthritis, unspecified: Secondary | ICD-10-CM | POA: Insufficient documentation

## 2016-02-12 DIAGNOSIS — Z9011 Acquired absence of right breast and nipple: Secondary | ICD-10-CM | POA: Diagnosis not present

## 2016-02-12 DIAGNOSIS — E119 Type 2 diabetes mellitus without complications: Secondary | ICD-10-CM | POA: Insufficient documentation

## 2016-02-12 DIAGNOSIS — N651 Disproportion of reconstructed breast: Secondary | ICD-10-CM | POA: Diagnosis not present

## 2016-02-12 DIAGNOSIS — T451X5A Adverse effect of antineoplastic and immunosuppressive drugs, initial encounter: Secondary | ICD-10-CM | POA: Insufficient documentation

## 2016-02-12 DIAGNOSIS — Z79899 Other long term (current) drug therapy: Secondary | ICD-10-CM | POA: Insufficient documentation

## 2016-02-12 DIAGNOSIS — Y831 Surgical operation with implant of artificial internal device as the cause of abnormal reaction of the patient, or of later complication, without mention of misadventure at the time of the procedure: Secondary | ICD-10-CM | POA: Insufficient documentation

## 2016-02-12 DIAGNOSIS — G620 Drug-induced polyneuropathy: Secondary | ICD-10-CM | POA: Insufficient documentation

## 2016-02-12 DIAGNOSIS — T8542XA Displacement of breast prosthesis and implant, initial encounter: Secondary | ICD-10-CM | POA: Diagnosis not present

## 2016-02-12 DIAGNOSIS — Z79811 Long term (current) use of aromatase inhibitors: Secondary | ICD-10-CM | POA: Insufficient documentation

## 2016-02-12 DIAGNOSIS — Z853 Personal history of malignant neoplasm of breast: Secondary | ICD-10-CM | POA: Insufficient documentation

## 2016-02-12 HISTORY — PX: BREAST IMPLANT EXCHANGE: SHX6296

## 2016-02-12 LAB — GLUCOSE, CAPILLARY: Glucose-Capillary: 107 mg/dL — ABNORMAL HIGH (ref 65–99)

## 2016-02-12 SURGERY — REPLACEMENT, IMPLANT, BREAST
Anesthesia: General | Site: Breast | Laterality: Right

## 2016-02-12 MED ORDER — OXYCODONE HCL 5 MG/5ML PO SOLN: 5.0000 mg | Freq: Once | ORAL | Status: AC | PRN

## 2016-02-12 MED ORDER — FENTANYL CITRATE (PF) 100 MCG/2ML IJ SOLN
25.0000 ug | INTRAMUSCULAR | Status: DC | PRN
  Administered 2016-02-12 (×3): 25 ug via INTRAVENOUS

## 2016-02-12 MED ORDER — CEFAZOLIN SODIUM-DEXTROSE 2-4 GM/100ML-% IV SOLN
2.0000 g | INTRAVENOUS | Status: AC
  Administered 2016-02-12: 2 g via INTRAVENOUS

## 2016-02-12 MED ORDER — PHENYLEPHRINE HCL 10 MG/ML IJ SOLN
10.0000 mg | INTRAVENOUS | Status: DC | PRN
  Administered 2016-02-12: 40 ug/min via INTRAVENOUS

## 2016-02-12 MED ORDER — ONDANSETRON HCL 4 MG/2ML IJ SOLN
INTRAMUSCULAR | Status: DC | PRN
  Administered 2016-02-12: 4 mg via INTRAVENOUS

## 2016-02-12 MED ORDER — FENTANYL CITRATE (PF) 100 MCG/2ML IJ SOLN
INTRAMUSCULAR | Status: AC
  Filled 2016-02-12: qty 2

## 2016-02-12 MED ORDER — ONDANSETRON HCL 4 MG/2ML IJ SOLN
INTRAMUSCULAR | Status: AC
Start: 2016-02-12 — End: 2016-02-12
  Filled 2016-02-12: qty 2

## 2016-02-12 MED ORDER — LIDOCAINE 2% (20 MG/ML) 5 ML SYRINGE
INTRAMUSCULAR | Status: AC
  Filled 2016-02-12: qty 5

## 2016-02-12 MED ORDER — GLYCOPYRROLATE 0.2 MG/ML IJ SOLN: 0.2000 mg | Freq: Once | INTRAMUSCULAR | Status: DC | PRN

## 2016-02-12 MED ORDER — SODIUM CHLORIDE 0.9 % IV SOLN
INTRAVENOUS | Status: DC | PRN
  Administered 2016-02-12: 1000 mL

## 2016-02-12 MED ORDER — PROPOFOL 10 MG/ML IV BOLUS
INTRAVENOUS | Status: AC
  Filled 2016-02-12: qty 20

## 2016-02-12 MED ORDER — KETOROLAC TROMETHAMINE 30 MG/ML IJ SOLN
INTRAMUSCULAR | Status: DC | PRN
  Administered 2016-02-12: 15 mg via INTRAVENOUS

## 2016-02-12 MED ORDER — KETOROLAC TROMETHAMINE 30 MG/ML IJ SOLN
INTRAMUSCULAR | Status: AC
  Filled 2016-02-12: qty 1

## 2016-02-12 MED ORDER — PHENYLEPHRINE HCL 10 MG/ML IJ SOLN
INTRAMUSCULAR | Status: AC
  Filled 2016-02-12: qty 1

## 2016-02-12 MED ORDER — SCOPOLAMINE 1 MG/3DAYS TD PT72
1.0000 | MEDICATED_PATCH | Freq: Once | TRANSDERMAL | Status: DC | PRN
  Administered 2016-02-12: 1.5 mg via TRANSDERMAL

## 2016-02-12 MED ORDER — DEXAMETHASONE SODIUM PHOSPHATE 4 MG/ML IJ SOLN
INTRAMUSCULAR | Status: DC | PRN
  Administered 2016-02-12: 10 mg via INTRAVENOUS

## 2016-02-12 MED ORDER — PHENYLEPHRINE 40 MCG/ML (10ML) SYRINGE FOR IV PUSH (FOR BLOOD PRESSURE SUPPORT)
PREFILLED_SYRINGE | INTRAVENOUS | Status: AC
  Filled 2016-02-12: qty 10

## 2016-02-12 MED ORDER — MIDAZOLAM HCL 2 MG/2ML IJ SOLN
INTRAMUSCULAR | Status: AC
  Filled 2016-02-12: qty 2

## 2016-02-12 MED ORDER — CHLORHEXIDINE GLUCONATE 4 % EX LIQD: 1.0000 "application " | Freq: Once | CUTANEOUS | Status: DC

## 2016-02-12 MED ORDER — CEFAZOLIN SODIUM-DEXTROSE 2-4 GM/100ML-% IV SOLN
INTRAVENOUS | Status: AC
  Filled 2016-02-12: qty 100

## 2016-02-12 MED ORDER — SULFAMETHOXAZOLE-TRIMETHOPRIM 800-160 MG PO TABS: 1.0000 | ORAL_TABLET | Freq: Two times a day (BID) | ORAL | Status: DC

## 2016-02-12 MED ORDER — ONDANSETRON HCL 4 MG/2ML IJ SOLN: 4.0000 mg | Freq: Once | INTRAMUSCULAR | Status: DC | PRN

## 2016-02-12 MED ORDER — PROPOFOL 10 MG/ML IV BOLUS
INTRAVENOUS | Status: DC | PRN
  Administered 2016-02-12: 50 mg via INTRAVENOUS
  Administered 2016-02-12: 100 mg via INTRAVENOUS
  Administered 2016-02-12: 30 mg via INTRAVENOUS

## 2016-02-12 MED ORDER — LACTATED RINGERS IV SOLN
INTRAVENOUS | Status: DC
  Administered 2016-02-12 (×2): via INTRAVENOUS

## 2016-02-12 MED ORDER — MIDAZOLAM HCL 2 MG/2ML IJ SOLN
1.0000 mg | INTRAMUSCULAR | Status: DC | PRN
  Administered 2016-02-12: 2 mg via INTRAVENOUS

## 2016-02-12 MED ORDER — PHENYLEPHRINE 40 MCG/ML (10ML) SYRINGE FOR IV PUSH (FOR BLOOD PRESSURE SUPPORT)
PREFILLED_SYRINGE | INTRAVENOUS | Status: DC | PRN
  Administered 2016-02-12 (×4): 60 ug via INTRAVENOUS
  Administered 2016-02-12 (×3): 40 ug via INTRAVENOUS

## 2016-02-12 MED ORDER — OXYCODONE HCL 5 MG PO TABS
ORAL_TABLET | ORAL | Status: AC
  Filled 2016-02-12: qty 1

## 2016-02-12 MED ORDER — SCOPOLAMINE 1 MG/3DAYS TD PT72
MEDICATED_PATCH | TRANSDERMAL | Status: AC
  Filled 2016-02-12: qty 1

## 2016-02-12 MED ORDER — FENTANYL CITRATE (PF) 100 MCG/2ML IJ SOLN
50.0000 ug | INTRAMUSCULAR | Status: AC | PRN
  Administered 2016-02-12 (×3): 25 ug via INTRAVENOUS

## 2016-02-12 MED ORDER — OXYCODONE HCL 5 MG PO TABS: 5.0000 mg | ORAL_TABLET | ORAL | Status: DC | PRN

## 2016-02-12 MED ORDER — LIDOCAINE HCL (CARDIAC) 20 MG/ML IV SOLN
INTRAVENOUS | Status: DC | PRN
  Administered 2016-02-12: 50 mg via INTRAVENOUS

## 2016-02-12 MED ORDER — SUCCINYLCHOLINE CHLORIDE 20 MG/ML IJ SOLN
INTRAMUSCULAR | Status: DC | PRN
  Administered 2016-02-12: 80 mg via INTRAVENOUS

## 2016-02-12 MED ORDER — OXYCODONE HCL 5 MG PO TABS
5.0000 mg | ORAL_TABLET | Freq: Once | ORAL | Status: AC | PRN
  Administered 2016-02-12: 5 mg via ORAL

## 2016-02-12 MED ORDER — DEXAMETHASONE SODIUM PHOSPHATE 10 MG/ML IJ SOLN
INTRAMUSCULAR | Status: AC
  Filled 2016-02-12: qty 1

## 2016-02-12 SURGICAL SUPPLY — 66 items
ALLODERM 4X16 TRU-THICK (Tissue) ×1 IMPLANT
ALLODERM RTU 4X16 TRU-THICK (Tissue) ×1 IMPLANT
BAG DECANTER FOR FLEXI CONT (MISCELLANEOUS) ×2 IMPLANT
BANDAGE ACE 6X5 VEL STRL LF (GAUZE/BANDAGES/DRESSINGS) IMPLANT
BINDER BREAST LRG (GAUZE/BANDAGES/DRESSINGS) IMPLANT
BINDER BREAST MEDIUM (GAUZE/BANDAGES/DRESSINGS) IMPLANT
BINDER BREAST XLRG (GAUZE/BANDAGES/DRESSINGS) ×2 IMPLANT
BINDER BREAST XXLRG (GAUZE/BANDAGES/DRESSINGS) IMPLANT
BLADE SURG 15 STRL LF DISP TIS (BLADE) ×1 IMPLANT
BLADE SURG 15 STRL SS (BLADE) ×1
BNDG GAUZE ELAST 4 BULKY (GAUZE/BANDAGES/DRESSINGS) ×4 IMPLANT
CANISTER SUCT 1200ML W/VALVE (MISCELLANEOUS) ×4 IMPLANT
CHLORAPREP W/TINT 26ML (MISCELLANEOUS) ×2 IMPLANT
COVER BACK TABLE 60X90IN (DRAPES) ×2 IMPLANT
COVER MAYO STAND STRL (DRAPES) ×2 IMPLANT
DECANTER SPIKE VIAL GLASS SM (MISCELLANEOUS) IMPLANT
DRAIN CHANNEL 15F RND FF W/TCR (WOUND CARE) ×2 IMPLANT
DRAPE LAPAROSCOPIC ABDOMINAL (DRAPES) ×2 IMPLANT
DRSG PAD ABDOMINAL 8X10 ST (GAUZE/BANDAGES/DRESSINGS) ×4 IMPLANT
DRSG TEGADERM 2-3/8X2-3/4 SM (GAUZE/BANDAGES/DRESSINGS) ×2 IMPLANT
ELECT BLADE 4.0 EZ CLEAN MEGAD (MISCELLANEOUS) ×2
ELECT COATED BLADE 2.86 ST (ELECTRODE) ×2 IMPLANT
ELECT REM PT RETURN 9FT ADLT (ELECTROSURGICAL) ×2
ELECTRODE BLDE 4.0 EZ CLN MEGD (MISCELLANEOUS) ×1 IMPLANT
ELECTRODE REM PT RTRN 9FT ADLT (ELECTROSURGICAL) ×1 IMPLANT
EVACUATOR SILICONE 100CC (DRAIN) ×2 IMPLANT
GLOVE BIO SURGEON STRL SZ 6 (GLOVE) ×4 IMPLANT
GLOVE BIO SURGEON STRL SZ 6.5 (GLOVE) IMPLANT
GLOVE BIOGEL PI IND STRL 7.0 (GLOVE) ×2 IMPLANT
GLOVE BIOGEL PI INDICATOR 7.0 (GLOVE) ×2
GLOVE ECLIPSE 6.5 STRL STRAW (GLOVE) ×2 IMPLANT
GOWN STRL REUS W/ TWL LRG LVL3 (GOWN DISPOSABLE) ×2 IMPLANT
GOWN STRL REUS W/TWL LRG LVL3 (GOWN DISPOSABLE) ×2
IMPLANT BREAST INSPIRA 580CC (Breast) ×2 IMPLANT
IV NS 500ML (IV SOLUTION)
IV NS 500ML BAXH (IV SOLUTION) IMPLANT
KIT FILL SYSTEM UNIVERSAL (SET/KITS/TRAYS/PACK) IMPLANT
LIQUID BAND (GAUZE/BANDAGES/DRESSINGS) ×2 IMPLANT
NEEDLE HYPO 25X1 1.5 SAFETY (NEEDLE) IMPLANT
PACK BASIN DAY SURGERY FS (CUSTOM PROCEDURE TRAY) ×2 IMPLANT
PENCIL BUTTON HOLSTER BLD 10FT (ELECTRODE) ×2 IMPLANT
PIN SAFETY STERILE (MISCELLANEOUS) IMPLANT
SIZER BREAST GENERIC (SIZER) ×2 IMPLANT
SIZER BREAST REUSE GEL 580CC (SIZER) ×2
SIZER BRST REUSE GEL 580CC (SIZER) ×1 IMPLANT
SLEEVE SCD COMPRESS KNEE MED (MISCELLANEOUS) ×2 IMPLANT
SPONGE LAP 18X18 X RAY DECT (DISPOSABLE) ×4 IMPLANT
STAPLER VISISTAT 35W (STAPLE) ×2 IMPLANT
SUT ETHILON 2 0 FS 18 (SUTURE) ×2 IMPLANT
SUT MNCRL AB 4-0 PS2 18 (SUTURE) ×2 IMPLANT
SUT PDS 3-0 CT2 (SUTURE)
SUT PDS AB 2-0 CT2 27 (SUTURE) IMPLANT
SUT PDS II 3-0 CT2 27 ABS (SUTURE) IMPLANT
SUT VIC AB 3-0 PS1 18 (SUTURE) ×1
SUT VIC AB 3-0 PS1 18XBRD (SUTURE) ×1 IMPLANT
SUT VIC AB 3-0 SH 27 (SUTURE) ×5
SUT VIC AB 3-0 SH 27X BRD (SUTURE) ×5 IMPLANT
SUT VICRYL 4-0 PS2 18IN ABS (SUTURE) ×2 IMPLANT
SYR 50ML LL SCALE MARK (SYRINGE) IMPLANT
SYR BULB IRRIGATION 50ML (SYRINGE) ×2 IMPLANT
SYR CONTROL 10ML LL (SYRINGE) IMPLANT
TISSUE ALLDRM RTU 4X16 TRU-TH (Tissue) ×1 IMPLANT
TOWEL OR 17X24 6PK STRL BLUE (TOWEL DISPOSABLE) ×4 IMPLANT
TUBE CONNECTING 20X1/4 (TUBING) ×2 IMPLANT
UNDERPAD 30X30 (UNDERPADS AND DIAPERS) ×4 IMPLANT
YANKAUER SUCT BULB TIP NO VENT (SUCTIONS) ×2 IMPLANT

## 2016-02-12 NOTE — Discharge Instructions (Signed)

## 2016-02-12 NOTE — Op Note (Addendum)
Operative Note   DATE OF OPERATION: 5.2.17  LOCATION: [facility]-outpatient  SURGICAL DIVISION: Plastic Surgery  PREOPERATIVE DIAGNOSES:  1. History breast cancer 2. Acquired absence breast 3. Asymmetry native and reconstructed breast  POSTOPERATIVE DIAGNOSES:  same  PROCEDURE:  1. Revision right reconstructed breast with exchange silicone breast implant 2. Acellular dermis (Alloderm) for breast reconstruction 56 cm2  SURGEON: Irene Limbo MD MBA  ASSISTANT: none  ANESTHESIA:  General.   EBL: 50 ml  COMPLICATIONS: None immediate.   INDICATIONS FOR PROCEDURE:  The patient, Angela Gross, is a 65 y.o. female born on Nov 03, 1950, is here for revision right breast reconstruction. She notes larger volume of right breast with lateral displacement implant.    FINDINGS: Prior prolene sutures placed for capsulorraphy and to set inframammary fold noted, both had failed with large pocket and lateral displacement implant in supine position. Natrelle Inspira Extra Full Biocell 580 implant placed, Ref [id number]. Removed Mentor Siltex UHP implant intact.   DESCRIPTION OF PROCEDURE:  The patient's operative site was marked with the patient in the preoperative area. The patient was taken to the operating room. SCDs were placed and IV antibiotics were given. The patient's operative site was prepped and draped in a sterile fashion. A time out was performed and all information was confirmed to be correct. Incision made in prior transverse chest scar lateral to reconstructed NAC and carried to capsule. Implant removed intact. Examination of capsule noted failed suture capsulorraphy laterally and wide pocket. Alloderm RTU was prepared and perforated and inset to chest wall and inframammary fold with 3-0 vicryl. This was continued over lateral chest wall at desired base width 14 cm. A sizer was then placed and the Alloderm draped over implant and trimmed to total size 56 cm2. The patient  was brought to supright sitting position and assessed for symmetry. Additional skin was tailor tacked over lateral breast to address redundant skin in this area. The patient was returned to supine position and sizer removed. Pocket irrigated with solution containing Ancef, gentamicin and bacitracin, followed by Betadine. 15 Fr. Drain placed in cavity above ADM. An Inspira Extra High Profile textured implant placed in cavity and superior edge of ADM secured to capsule of mastectomy flap draped over implant. Area marked by tailor tacking was de epithelialied ad closure completed with 3-0 vicryl to approximate capsule or superficial fascia laterally. 4-0 vicryl used in dermis and 4-0 monocryl for skin closure. Tissue adhesive, dry dressing and breast binder placed,   The patient was allowed to wake from anesthesia, extubated and taken to the recovery room in satisfactory condition.   SPECIMENS: right mastectomy flap  DRAINS: 15 Fr JP in right chest  Irene Limbo, MD [facility] [phone]

## 2016-02-12 NOTE — Anesthesia Procedure Notes (Addendum)
Procedure Name: LMA Insertion Date/Time: 02/12/2016 9:40 AM Performed by: Bethena Roys T Pre-anesthesia Checklist: Patient identified, Emergency Drugs available, Suction available and Patient being monitored Patient Re-evaluated:Patient Re-evaluated prior to inductionOxygen Delivery Method: Circle System Utilized Preoxygenation: Pre-oxygenation with 100% oxygen Intubation Type: IV induction LMA: LMA inserted LMA Size: 4.0 Number of attempts: 1 Airway Equipment and Method: Bite block Placement Confirmation: positive ETCO2 Tube secured with: Tape Dental Injury: Teeth and Oropharynx as per pre-operative assessment    Procedure Name: Intubation Date/Time: 02/12/2016 9:40 AM Performed by: Bethena Roys T Pre-anesthesia Checklist: Patient identified, Emergency Drugs available, Suction available and Patient being monitored Ventilation: Mask ventilation without difficulty Laryngoscope Size: Mac and 3 Grade View: Grade I Tube type: Oral Tube size: 7.0 mm Number of attempts: 1 Airway Equipment and Method: Stylet and Oral airway Placement Confirmation: ETT inserted through vocal cords under direct vision,  positive ETCO2 and breath sounds checked- equal and bilateral Tube secured with: Tape Dental Injury: Teeth and Oropharynx as per pre-operative assessment

## 2016-02-12 NOTE — Interval H&P Note (Signed)
History and Physical Interval Note:  02/12/2016 8:53 AM  Angela Gross  has presented today for surgery, with the diagnosis of HISTORY OF BREAST CANCER,ACQUIRED ABSCENCE BREAST, ASSYMMETRY NATIVE AND RECONSTRUCTIVE BREAST  The various methods of treatment have been discussed with the patient and family. After consideration of risks, benefits and other options for treatment, the patient has consented to  Exchange right reconstructed breast silicone implant, possible capsulorraphy, possible placement acellular dermis as a surgical intervention .  The patient's history has been reviewed, patient examined, no change in status, stable for surgery.  I have reviewed the patient's chart and labs.  Questions were answered to the patient's satisfaction.     Angela Gross

## 2016-02-12 NOTE — Transfer of Care (Signed)
Immediate Anesthesia Transfer of Care Note  Patient: Angela Gross  Procedure(s) Performed: Procedure(s): EXCHANGE SILICONE IMPLANT ON RIGHT, CAPSULORRAPHY AND PLACEMENT OF ALLODERM (Right)  Patient Location: PACU  Anesthesia Type:General  Level of Consciousness: awake and oriented  Airway & Oxygen Therapy: Patient Spontanous Breathing and Patient connected to face mask oxygen  Post-op Assessment: Report given to RN  Post vital signs: Reviewed and stable  Last Vitals:  Filed Vitals:   02/12/16 0814 02/12/16 1130  BP: 128/82 118/89  Pulse: 91 93  Temp: 36.9 C 36.4 C  Resp: 20 13    Last Pain: There were no vitals filed for this visit.       Complications: No apparent anesthesia complications

## 2016-02-12 NOTE — Anesthesia Postprocedure Evaluation (Signed)
Anesthesia Post Note  Patient: Angela Gross  Procedure(s) Performed: Procedure(s) (LRB): EXCHANGE SILICONE IMPLANT ON RIGHT, CAPSULORRAPHY AND PLACEMENT OF ALLODERM (Right)  Patient location during evaluation: PACU Anesthesia Type: General Level of consciousness: awake and alert and oriented Pain management: pain level controlled Vital Signs Assessment: post-procedure vital signs reviewed and stable Respiratory status: spontaneous breathing, nonlabored ventilation and respiratory function stable Cardiovascular status: blood pressure returned to baseline and stable Postop Assessment: no signs of nausea or vomiting Anesthetic complications: no    Last Vitals:  Filed Vitals:   02/12/16 1200 02/12/16 1215  BP: 127/92 106/78  Pulse: 82 82  Temp:    Resp: 21 13    Last Pain:  Filed Vitals:   02/12/16 1238  PainSc: 3                  Damarcus Reggio A.

## 2016-02-12 NOTE — Anesthesia Preprocedure Evaluation (Addendum)
Anesthesia Evaluation  Patient identified by MRN, date of birth, ID band Patient awake    Reviewed: Allergy & Precautions, NPO status , Patient's Chart, lab work & pertinent test results  History of Anesthesia Complications (+) PONV and history of anesthetic complications  Airway Mallampati: I  TM Distance: >3 FB Neck ROM: Full    Dental  (+) Edentulous Lower, Edentulous Upper   Pulmonary    Pulmonary exam normal breath sounds clear to auscultation       Cardiovascular hypertension, Pt. on medications + Peripheral Vascular Disease  Normal cardiovascular exam Rhythm:Regular Rate:Normal  ECHO 3/16 Study Conclusions  - Left ventricle: The cavity size was normal. Wall thickness was normal. Systolic function was normal. The estimated ejection fraction was in the range of 55% to 60%. Strain imaging may be inaccurate due to poor endocardial definition in the anteroseptal walls - GLPSS is -16%. Wall motion was normal; there were no regional wall motion abnormalities. Left ventricular diastolic function parameters were normal. Ejection fraction (MOD, 2-plane): 58%. - Mitral valve: Mildly thickened leaflets . There was mild regurgitation. - Left atrium: The atrium was normal in size.  Impressions:  - LVEF 55-60% - strain is abnormal at -16% (improved prior to the earlier study), however, I question its&' accuracy - there is poor anterior myocardial definition. I have measured EF by simpson&'s biplane to be 58%.    Neuro/Psych Anxiety    GI/Hepatic   Endo/Other  diabetes, Type 2  Renal/GU      Musculoskeletal  (+) Arthritis , Fibromyalgia -  Abdominal   Peds  Hematology   Anesthesia Other Findings Ms. Tramontana ends up persistently nauseated following general anesthesia even with decadron and zofran having been given. The scopolamine patch does seem to help but not until it takes effect later in  the day. I have recommended propofol based anesthetic in addition to decadron and zofran+ scop patch. Hopefully this will help her with her persistent poor anesthetic experiences  Reproductive/Obstetrics                            Anesthesia Physical  Anesthesia Plan  ASA: III  Anesthesia Plan: General   Post-op Pain Management:    Induction: Intravenous  Airway Management Planned: LMA  Additional Equipment:   Intra-op Plan:   Post-operative Plan: Extubation in OR  Informed Consent: I have reviewed the patients History and Physical, chart, labs and discussed the procedure including the risks, benefits and alternatives for the proposed anesthesia with the patient or authorized representative who has indicated his/her understanding and acceptance.     Plan Discussed with: CRNA and Surgeon  Anesthesia Plan Comments:         Anesthesia Quick Evaluation

## 2016-02-13 ENCOUNTER — Encounter (HOSPITAL_BASED_OUTPATIENT_CLINIC_OR_DEPARTMENT_OTHER): Payer: Self-pay | Admitting: Plastic Surgery

## 2016-02-19 ENCOUNTER — Ambulatory Visit
Admission: RE | Admit: 2016-02-19 | Discharge: 2016-02-19 | Disposition: A | Discharge status: Home / Self Care | Payer: Medicare HMO | Source: Ambulatory Visit | Attending: Oncology | Admitting: Oncology

## 2016-02-19 DIAGNOSIS — Z1231 Encounter for screening mammogram for malignant neoplasm of breast: Secondary | ICD-10-CM

## 2016-03-05 ENCOUNTER — Telehealth: Payer: Self-pay | Admitting: Oncology

## 2016-03-05 ENCOUNTER — Ambulatory Visit (HOSPITAL_BASED_OUTPATIENT_CLINIC_OR_DEPARTMENT_OTHER): Payer: Medicare HMO | Admitting: Oncology

## 2016-03-05 ENCOUNTER — Ambulatory Visit (HOSPITAL_BASED_OUTPATIENT_CLINIC_OR_DEPARTMENT_OTHER): Payer: Medicare HMO

## 2016-03-05 VITALS — BP 116/83 | HR 111 | Temp 98.6°F | Resp 18 | Ht 60.0 in | Wt 161.3 lb

## 2016-03-05 DIAGNOSIS — G620 Drug-induced polyneuropathy: Secondary | ICD-10-CM

## 2016-03-05 DIAGNOSIS — M810 Age-related osteoporosis without current pathological fracture: Secondary | ICD-10-CM

## 2016-03-05 DIAGNOSIS — M069 Rheumatoid arthritis, unspecified: Secondary | ICD-10-CM | POA: Diagnosis not present

## 2016-03-05 DIAGNOSIS — Z170 Estrogen receptor positive status [ER+]: Secondary | ICD-10-CM

## 2016-03-05 DIAGNOSIS — C50811 Malignant neoplasm of overlapping sites of right female breast: Secondary | ICD-10-CM | POA: Diagnosis not present

## 2016-03-05 DIAGNOSIS — M2550 Pain in unspecified joint: Secondary | ICD-10-CM

## 2016-03-05 DIAGNOSIS — N951 Menopausal and female climacteric states: Secondary | ICD-10-CM

## 2016-03-05 MED ORDER — CYCLOBENZAPRINE HCL 10 MG PO TABS: 10.0000 mg | ORAL_TABLET | Freq: Three times a day (TID) | ORAL | Status: DC | PRN

## 2016-03-05 MED ORDER — DENOSUMAB 60 MG/ML ~~LOC~~ SOLN
60.0000 mg | Freq: Once | SUBCUTANEOUS | Status: AC
  Administered 2016-03-05: 60 mg via SUBCUTANEOUS
  Filled 2016-03-05: qty 1

## 2016-03-05 NOTE — Progress Notes (Signed)
[facility]  [phone] [phone]) [phone]     ID: Angela Gross OB: 01/08/1951  MR#: [id number]  QMV#:[id number]  PCP: Helane Rima, MD GYN:   SU: Fanny Skates OTHER MD: [name], Claire Sanger, Arnoldo Hooker [place]mappa  CHIEF COMPLAINT:  Right Breast Cancer, triple positive CURRENT TREATMENT:  anastrozole  BREAST CANCER HISTORY: From the original intake note:  Angela Gross had routine screening mammography at the breast center 09/21/2013 showing a possible mass in calcifications in the right breast. On 10/11/2013 she underwent right diagnostic mammography and ultrasonography. This confirmed a spiculated 1.2 cm mass in the central right breast, with a group of pleomorphic calcifications slightly laterally. The calcifications spanning 1.6 cm. A second group of calcifications extended inferiorly and spanned 8 mm. The entire area in aggregate measured 6.5 cm. By physical exam there was no palpable abnormality. Ultrasonography showed an irregular hypoechoic mass at the 6:00 position of the right breast measuring 1 cm maximally. The right axilla was normal.  Biopsy of the right breast mass in question as well as the more lateral area of calcifications on 10/11/2013 showed ([id number]) most to be positive for an invasive ductal carcinoma, both estrogen receptor 85-90% positive with strong staining intensity, both progesterone receptor negative, with an MIB-1 of 17%. HER-2 was amplified, with a signals ratio of 3.44 and a copy number per cell 04 0.30.  Bilateral breast MRI 10/19/2013 showed again a spiculated mass in the 6:00 region of the right breast measuring 1.2 cm, a slightly more lateral hematoma associated with a second biopsy and also with clumped enhancement, and asymmetric none masslike enhancement in most of the right breast, the entire area of abnormality measuring 9.0 cm. The left breast was unremarkable and there were no abnormal appearing lymph nodes.  Her  subsequent history is as detailed below   INTERVAL HISTORY: Angela Gross returns for follow up of her estrogen receptor positive breast cancer. She continues on anastrozole, which she tolerates remarkably well. She does have some hot flashes and night sweats from this. Vaginal dryness is not a major issue.  REVIEW OF SYSTEMS:  Since her last visit here she had a her right implant replaced with a smaller silicone implant. He says "no I have my arm. Back". [facility] a [facility] basis. Sometimes she feels a little bit short of breath particularly when climbing stairs. She does have arthritis problems which are not more intense or persistent than before. She has some peripheral neuropathy but this is very minimal. A detailed review of systems today was otherwise stable.  PAST MEDICAL HISTORY: Past Medical History  Diagnosis Date  . Raynaud disease   . Osteoporosis   . PONV (postoperative nausea and vomiting)   . Wears glasses   . Full dentures   . Fibromyalgia   . Rheumatoid arthritis ([place])     "elbows; fingers; toes"  . Osteoarthritis of both knees   . Degenerative disc disease, cervical   . Chronic left shoulder pain     "work related injury" (01/10/2014)  . Breast cancer ([place])     "right" (01/10/2014)  . Type II diabetes mellitus ([facility])     has not taken meds in over a month (01/10/2014)  . Syncope and collapse     "4 times in the last year" (01/10/2014)  . Anxiety   . Hypertension     no meds now    PAST SURGICAL HISTORY: Past Surgical History  Procedure  Laterality Date  . Colonoscopy    . Mastectomy w/ sentinel node biopsy Right 12/13/2013    Procedure: RIGHT TOTAL MASTECTOMY WITH SENTINEL LYMPH NODE BIOPSY;  Surgeon: Adin Hector, MD;  Location: [facility];  Service: General;  Laterality: Right;  . Portacath placement Right 12/13/2013    Procedure: INSERTION PORT-A-CATH;  Surgeon: Adin Hector, MD;  Location: [facility];  Service: General;  Laterality: Right;  . Breast reconstruction with placement of tissue expander and flex hd (acellular hydrated dermis) Right 12/13/2013    Procedure: RIGHT BREAST RECONSTRUCTION WITH PLACEMENT OF TISSUE EXPANDER AND FLEX HD (ACELLULAR HYDRATED DERMIS);  Surgeon: Irene Limbo, MD;  Location: [facility];  Service: Plastics;  Laterality: Right;  . Lipoma excision Bilateral 12/13/2013    Procedure: EXCISION OF LEFT BREAST KELOID AND RIGHT CHEST KELOID;  Surgeon: Irene Limbo, MD;  Location: [facility];  Service: Plastics;  Laterality: Bilateral;  . Mastectomy    . Cholecystectomy  1970's  . Vaginal hysterectomy  1980    no salpingo-oophoretomu  . Breast biopsy Right 10/2013  . Tissue expander placement Right 01/10/2014    Procedure: Incision and Drainage Right Breast Seroma with TISSUE EXPANDER exchange;  Surgeon: Irene Limbo, MD;  Location: [facility];  Service: Plastics;  Laterality: Right;  . Removal of bilateral tissue expanders with placement of bilateral breast implants Bilateral 09/26/2014    Procedure: REMOVAL OF RIGHT TISSUE EXPANDER WITH PLACEMENT OF RIGHT BREAST IMPLANT LIPOFILLING TO RIGHT BREAST/LEFT BREAST MASTOPEXY FOR SYMMETRY;  Surgeon: Irene Limbo, MD;  Location: [facility];  Service: Plastics;  Laterality: Bilateral;  . Liposuction with lipofilling Right 09/26/2014    Procedure: LIPOSUCTION WITH LIPOFILLING;  Surgeon: Irene Limbo, MD;  Location: [facility];  Service: Plastics;  Laterality: Right;  . Mastopexy Left 09/26/2014    Procedure: MASTOPEXY;  Surgeon: Irene Limbo, MD;  Location: [facility];  Service: Plastics;  Laterality: Left;  . Breast reconstruction Right 12/12/2014    Procedure: REVISION OF RIGHT RECONSTRUCTIVE BREAST WITH CAPSULORRHAPHY PLACEMENT OF SILCONE IMPLANTS AND LIPOFILLING TO RIGHT CHEST ;  Surgeon: Irene Limbo, MD;  Location: [facility];  Service: Plastics;  Laterality: Right;  . Liposuction with lipofilling Right 12/12/2014    Procedure: LIPOSUCTION WITH LIPOFILLING;  Surgeon: Irene Limbo, MD;  Location: [facility];  Service: Plastics;  Laterality: Right;  . Breast reconstruction Right 03/06/2015    Procedure: RIGHT NIPPLE AEROLA COMPLEX RECONSTRUCTION AND LOCAL FLAP WITH LIPO FILLING TO RIGHT BREAST,REMOVAL RIGHT CHEST PORT;  Surgeon: Irene Limbo, MD;  Location: [facility];  Service: Plastics;  Laterality: Right;  . Liposuction with lipofilling Right 03/06/2015    Procedure: ABDOMINAL LIPOSUCTION WITH LIPOFILLING TO RIGHT BREAST;  Surgeon: Irene Limbo, MD;  Location: [facility];  Service: Plastics;  Laterality: Right;  . Port-a-cath removal Right 03/06/2015    Procedure: REMOVAL PORT-A-CATH;  Surgeon: Irene Limbo, MD;  Location: [facility];  Service: Plastics;  Laterality: Right;  . Breast implant exchange Right 02/12/2016    Procedure: EXCHANGE SILICONE IMPLANT ON RIGHT, CAPSULORRAPHY AND PLACEMENT OF ALLODERM;  Surgeon: Irene Limbo, MD;  Location: [facility];  Service: Plastics;  Laterality: Right;    FAMILY HISTORY Family History  Problem Relation Age of Onset  . Heart disease Mother   . Hypertension Mother   . Breast cancer Mother   . Diabetes Mother   .  Congestive Heart Failure Mother   . Glaucoma Mother    The patient is little information about her father. Her mother died from a heart attack at the age of 48. She had been diagnosed with breast cancer in her late 70s. The patient had 4 brothers and 4 sisters. There is no other history of breast or ovarian cancer in the family to her knowledge.  GYNECOLOGIC HISTORY:    (Reviewed 04/04/2014) She does not recall her age of menarche. She underwent hysterectomy in her 38s. She did not receive hormone replacement. First live birth at 61. She was GX P2.  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 She is currently disabled secondary to her rheumatoid arthritis. Her husband Ada Holness is a retired Administrator. He now works part time at [employer]. Son [name] works in [place] as a Patent attorney. Daughter [name] is a Social worker.    ADVANCED DIRECTIVES: Not in place   HEALTH MAINTENANCE: (Updated 04/04/2014) Social History  Substance Use Topics  . Smoking status: Never Smoker   . Smokeless tobacco: Never Used  . Alcohol Use: No     Colonoscopy: Not on file  PAP: Status post remote hysterectomy  Bone density: At [facility] 01/05[facility]2009 showed osteopenia with a T score of -1.7  Lipid panel: Not on file   Allergies  Allergen Reactions  . Codeine Other (See Comments) and Hives    Altered mental status, numbness  Altered mental status, Numbess  . Diazepam     Other reaction(s): Delusions (intolerance) hallucinations     Current Outpatient Prescriptions  Medication Sig Dispense Refill  . anastrozole (ARIMIDEX) 1 MG tablet TAKE ONE TABLET BY MOUTH ONCE DAILY 30 tablet 6  . Cyanocobalamin (VITAMIN B-12 PO) Take by mouth daily.    . cyclobenzaprine (FLEXERIL) 10 MG tablet Take 1 tablet (10 mg total) by mouth 3 (three) times daily as needed for muscle spasms. 30 tablet 3  . folic acid (FOLVITE) 1 MG tablet Take 1 mg by mouth daily.    Marland Kitchen gabapentin (NEURONTIN) 300 MG capsule Take 3 capsules (900 mg total) by mouth 3 (three) times daily. For neuropathy and hot flashes 270 capsule 11  . methotrexate (50 MG/ML) 1 G injection Inject 8 mg as directed once a week.     Marland Kitchen oxyCODONE (ROXICODONE) 5 MG immediate release tablet Take 1-2 tablets (5-10 mg total) by mouth every 4 (four) hours as needed for severe pain. 50 tablet 0  . sulfamethoxazole-trimethoprim (BACTRIM DS,SEPTRA DS) 800-160 MG tablet Take 1 tablet by mouth 2 (two)  times daily. 14 tablet 0  . traMADol (ULTRAM) 50 MG tablet Take 1 tablet (50 mg total) by mouth 3 (three) times daily as needed (pain). 90 tablet 1  . UNABLE TO FIND Med Name: Amantadine 8%, Baclofen 2%, Gabapentin 6%, Amitriptyline 4%, Bupivacaine 2%, Clonidine 0.2%, Nifedipine 2%     No current facility-administered medications for this visit.    OBJECTIVE:  Middle-aged African-American woman  Filed Vitals:   03/05/16 1148  BP: 116/83  Pulse: 111  Temp: 98.6 F (37 C)  Resp: 18     Body mass index is 31.5 kg/(m^2).    ECOG FS: 0  Filed Weights   03/05/16 1148  Weight: 161 lb 4.8 oz (73.165 kg)    Sclerae unicteric, pupils round and equal Oropharynx clear and moist-- no thrush or other lesions  No cervical or supraclavicular adenopathy Lungs no rales or rhonchi Heart regular rate and rhythm Abd soft, obese, nontender, positive bowel sounds MSK no focal spinal tenderness, no upper extremity lymphedema Neuro: nonfocal, well oriented, appropriate affect Breasts: The right breast is status post mastectomy with silicone implant reconstruction. The incisions are healing very nicely. There is no evidence of local recurrence. The right axilla is benign. The left breast is status post reduction mammoplasty. It is otherwise unremarkable.   LAB RESULTS:   Lab Results  Component Value Date   WBC 11.2* 09/12/2015   NEUTROABS 8.3* 09/12/2015   HGB 12.1 02/06/2016   HCT 38.8 02/06/2016   MCV 84.4 09/12/2015   PLT 177 09/12/2015      Chemistry      Component Value Date/Time   NA 142 02/06/2016 1624   NA 142 09/12/2015 1245   K 4.1 02/06/2016 1624   K 4.0 09/12/2015 1245   CL 105 02/06/2016 1624   CO2 28 02/06/2016 1624   CO2 28 09/12/2015 1245   BUN 8 02/06/2016 1624   BUN 17.6 09/12/2015 1245   CREATININE 0.98 02/06/2016 1624   CREATININE 1.1 09/12/2015 1245      Component Value Date/Time   CALCIUM 9.5 02/06/2016 1624   CALCIUM 9.5 09/12/2015 1245   ALKPHOS 112  09/12/2015 1245   ALKPHOS 102 12/09/2013 1400   AST 15 09/12/2015 1245   AST 36 12/09/2013 1400   ALT 10 09/12/2015 1245   ALT 30 12/09/2013 1400   BILITOT 0.38 09/12/2015 1245   BILITOT 0.2* 12/09/2013 1400     STUDIES: Mm Screening Breast Tomo Uni L  02/20/2016  CLINICAL DATA:  Screening. History of right breast cancer status post mastectomy in 2015. EXAM: 2D DIGITAL SCREENING UNILATERAL LEFT MAMMOGRAM WITH CAD AND ADJUNCT TOMO COMPARISON:  Previous exam(s). ACR Breast Density Category b: There are scattered areas of fibroglandular density. FINDINGS: The patient has had a right mastectomy. There are no findings suspicious for malignancy within the left breast. Images were processed with CAD. IMPRESSION: No mammographic evidence of malignancy. A result letter of this screening mammogram will be mailed directly to the patient. RECOMMENDATION: Screening mammogram in one year.  (Code:SM-L-46M) BI-RADS CATEGORY  1: Negative. Electronically Signed   By: Franki Cabot M.D.   On: 02/20/2016 08:40     ASSESSMENT: 65 y.o. [place] woman   (1)  s/p biopsy of separate Right breast masses 10/11/2013 for a clinical mT1 N0, stage IA invasive ductal carcinoma, grade II-III, one of the mass is being 85% estrogen receptor positive, progesterone receptor negative, with an MIB-1 of 17% and HER-2 amplified.  (2) status post right mastectomy and sentinel lymph node sampling with immediate expander placement 12/13/2013 for a pT1c pN0, stage IA invasive ductal carcinoma, grade 3, again HER-2 positive.  (3) treated in the adjuvant setting with carboplatin, docetaxel, trastuzumab and pertuzumab, first dose on 02/21/2014   (a). Patient developed significant peripheral neuropathy after one cycle of chemotherapy, and both carboplatin and docetaxel were held beginning with cycle 2. Continued pertuzumab/ trastuzumab x 5 more cycles, completed 08/01/2014  (b)  single agent trastuzumab started 08/29/2014, continued every  3 weeks until May 2016  (d) final echocardiogram 04/25/2015 showed a well preserved ejection fraction at 55%  (4) started on anastrozole on 04/04/2014 .   (a) DEXA scans 12/06/2013  shows osteoporosis, with the lowest T score at -2.49  (b) denosumab/Proluia to started 03/08/2015, repeated every 6 months while on anastrozole     (  5) Rheumatoid arthritis,  remicade and lyrica held until completion of trastuzumab. Controlled with oxycodone PRN  (6) chemotherapy-induced peripheral neuropathy  - ongabapentin, 300 BID, and tramadol, 50 mg every 6 hours as needed.    (7)  Implant reconstruction with silicone implant, right breast, with revision 02/12/2016  (a) left breast status post reduction mammoplasty   PLAN: Scout is finally pretty much done with her reconstruction and she is starting a good exercise program.  She is now a little over 2 years out from definitive surgery for her breast cancer with no evidence of disease recurrence. This is very favorable  She is tolerating the anastrozole well, with hot flashes as her only complication. The plan is to continue anastrozole for a total of 5 years.  As far as her osteoporosis is concerned she will receive probably a today and every 6 months until she is done with anastrozole.  She will see me again in one year. She knows to call for any problems that may develop before that visit.      Chauncey Cruel, MD  03/05/2016 11:55 AMcumulative

## 2016-03-05 NOTE — Patient Instructions (Signed)
Denosumab injection What is this medicine? DENOSUMAB (den oh sue mab) slows bone breakdown. Prolia is used to treat osteoporosis in women after menopause and in men. Xgeva is used to prevent bone fractures and other bone problems caused by cancer bone metastases. Xgeva is also used to treat giant cell tumor of the bone. This medicine may be used for other purposes; ask your health care provider or pharmacist if you have questions. COMMON BRAND NAME(S): Prolia, XGEVA What should I tell my health care provider before I take this medicine? They need to know if you have any of these conditions: -dental disease -eczema -infection or history of infections -kidney disease or on dialysis -low blood calcium or vitamin D -malabsorption syndrome -scheduled to have surgery or tooth extraction -taking medicine that contains denosumab -thyroid or parathyroid disease -an unusual reaction to denosumab, other medicines, foods, dyes, or preservatives -pregnant or trying to get pregnant -breast-feeding How should I use this medicine? This medicine is for injection under the skin. It is given by a health care professional in a hospital or clinic setting. If you are getting Prolia, a special MedGuide will be given to you by the pharmacist with each prescription and refill. Be sure to read this information carefully each time. For Prolia, talk to your pediatrician regarding the use of this medicine in children. Special care may be needed. For Xgeva, talk to your pediatrician regarding the use of this medicine in children. While this drug may be prescribed for children as young as 13 years for selected conditions, precautions do apply. Overdosage: If you think you've taken too much of this medicine contact a poison control center or emergency room at once. Overdosage: If you think you have taken too much of this medicine contact a poison control center or emergency room at once. NOTE: This medicine is only for  you. Do not share this medicine with others. What if I miss a dose? It is important not to miss your dose. Call your doctor or health care professional if you are unable to keep an appointment. What may interact with this medicine? Do not take this medicine with any of the following medications: -other medicines containing denosumab This medicine may also interact with the following medications: -medicines that suppress the immune system -medicines that treat cancer -steroid medicines like prednisone or cortisone This list may not describe all possible interactions. Give your health care provider a list of all the medicines, herbs, non-prescription drugs, or dietary supplements you use. Also tell them if you smoke, drink alcohol, or use illegal drugs. Some items may interact with your medicine. What should I watch for while using this medicine? Visit your doctor or health care professional for regular checks on your progress. Your doctor or health care professional may order blood tests and other tests to see how you are doing. Call your doctor or health care professional if you get a cold or other infection while receiving this medicine. Do not treat yourself. This medicine may decrease your body's ability to fight infection. You should make sure you get enough calcium and vitamin D while you are taking this medicine, unless your doctor tells you not to. Discuss the foods you eat and the vitamins you take with your health care professional. See your dentist regularly. Brush and floss your teeth as directed. Before you have any dental work done, tell your dentist you are receiving this medicine. Do not become pregnant while taking this medicine or for 5 months after stopping   it. Women should inform their doctor if they wish to become pregnant or think they might be pregnant. There is a potential for serious side effects to an unborn child. Talk to your health care professional or pharmacist for more  information. What side effects may I notice from receiving this medicine? Side effects that you should report to your doctor or health care professional as soon as possible: -allergic reactions like skin rash, itching or hives, swelling of the face, lips, or tongue -breathing problems -chest pain -fast, irregular heartbeat -feeling faint or lightheaded, falls -fever, chills, or any other sign of infection -muscle spasms, tightening, or twitches -numbness or tingling -skin blisters or bumps, or is dry, peels, or red -slow healing or unexplained pain in the mouth or jaw -unusual bleeding or bruising Side effects that usually do not require medical attention (Report these to your doctor or health care professional if they continue or are bothersome.): -muscle pain -stomach upset, gas This list may not describe all possible side effects. Call your doctor for medical advice about side effects. You may report side effects to FDA at 1-800-FDA-1088. Where should I keep my medicine? This medicine is only given in a clinic, doctor's office, or other health care setting and will not be stored at home. NOTE: This sheet is a summary. It may not cover all possible information. If you have questions about this medicine, talk to your doctor, pharmacist, or health care provider.  2015, Elsevier/Gold Standard. (2012-03-29 12:37:47)  

## 2016-03-05 NOTE — Telephone Encounter (Signed)
appt made and avs printed °

## 2016-03-19 ENCOUNTER — Telehealth: Payer: Self-pay | Admitting: *Deleted

## 2016-03-19 ENCOUNTER — Telehealth: Payer: Self-pay

## 2016-03-19 DIAGNOSIS — M2550 Pain in unspecified joint: Secondary | ICD-10-CM

## 2016-03-19 MED ORDER — TRAMADOL HCL 50 MG PO TABS: 50.0000 mg | ORAL_TABLET | Freq: Three times a day (TID) | ORAL | Status: DC | PRN

## 2016-03-19 NOTE — Telephone Encounter (Signed)
S/w pt that her request for tramadol refill was being called in to [facility]

## 2016-03-19 NOTE — Telephone Encounter (Signed)
Pt called requesting refill of Tramadol.   Please call pt when script ready for pick up. Pt's    Phone     [phone].

## 2016-05-05 ENCOUNTER — Encounter (HOSPITAL_COMMUNITY): Payer: Self-pay | Admitting: *Deleted

## 2016-05-05 ENCOUNTER — Emergency Department (HOSPITAL_COMMUNITY)
Admission: EM | Admit: 2016-05-05 | Discharge: 2016-05-05 | Disposition: A | Discharge status: Home / Self Care | Payer: Medicare HMO | Attending: Emergency Medicine | Admitting: Emergency Medicine

## 2016-05-05 ENCOUNTER — Emergency Department (HOSPITAL_COMMUNITY): Payer: Medicare HMO

## 2016-05-05 DIAGNOSIS — W06XXXA Fall from bed, initial encounter: Secondary | ICD-10-CM | POA: Insufficient documentation

## 2016-05-05 DIAGNOSIS — R42 Dizziness and giddiness: Secondary | ICD-10-CM

## 2016-05-05 DIAGNOSIS — Y999 Unspecified external cause status: Secondary | ICD-10-CM | POA: Insufficient documentation

## 2016-05-05 DIAGNOSIS — I10 Essential (primary) hypertension: Secondary | ICD-10-CM | POA: Diagnosis not present

## 2016-05-05 DIAGNOSIS — Y9301 Activity, walking, marching and hiking: Secondary | ICD-10-CM | POA: Insufficient documentation

## 2016-05-05 DIAGNOSIS — Z853 Personal history of malignant neoplasm of breast: Secondary | ICD-10-CM | POA: Diagnosis not present

## 2016-05-05 DIAGNOSIS — Y9289 Other specified places as the place of occurrence of the external cause: Secondary | ICD-10-CM | POA: Insufficient documentation

## 2016-05-05 DIAGNOSIS — E119 Type 2 diabetes mellitus without complications: Secondary | ICD-10-CM | POA: Diagnosis not present

## 2016-05-05 LAB — CBC WITH DIFFERENTIAL/PLATELET
BASOS ABS: 0 10*3/uL (ref 0.0–0.1)
Basophils Relative: 0 %
EOS PCT: 0 %
Eosinophils Absolute: 0 10*3/uL (ref 0.0–0.7)
HEMATOCRIT: 39.1 % (ref 36.0–46.0)
HEMOGLOBIN: 12.7 g/dL (ref 12.0–15.0)
LYMPHS PCT: 18 %
Lymphs Abs: 2.4 10*3/uL (ref 0.7–4.0)
MCH: 26.5 pg (ref 26.0–34.0)
MCHC: 32.5 g/dL (ref 30.0–36.0)
MCV: 81.5 fL (ref 78.0–100.0)
MONOS PCT: 5 %
Monocytes Absolute: 0.7 10*3/uL (ref 0.1–1.0)
NEUTROS PCT: 77 %
Neutro Abs: 10.2 10*3/uL — ABNORMAL HIGH (ref 1.7–7.7)
PLATELETS: 199 10*3/uL (ref 150–400)
RBC: 4.8 MIL/uL (ref 3.87–5.11)
RDW: 14.9 % (ref 11.5–15.5)
WBC: 13.3 10*3/uL — AB (ref 4.0–10.5)

## 2016-05-05 LAB — I-STAT CHEM 8, ED
BUN: 15 mg/dL (ref 6–20)
CHLORIDE: 102 mmol/L (ref 101–111)
CREATININE: 1 mg/dL (ref 0.44–1.00)
Calcium, Ion: 1.12 mmol/L (ref 1.12–1.23)
Glucose, Bld: 119 mg/dL — ABNORMAL HIGH (ref 65–99)
HEMATOCRIT: 40 % (ref 36.0–46.0)
Hemoglobin: 13.6 g/dL (ref 12.0–15.0)
Potassium: 4 mmol/L (ref 3.5–5.1)
SODIUM: 139 mmol/L (ref 135–145)
TCO2: 28 mmol/L (ref 0–100)

## 2016-05-05 LAB — I-STAT TROPONIN, ED
Troponin i, poc: 0 ng/mL (ref 0.00–0.08)
Troponin i, poc: 0 ng/mL (ref 0.00–0.08)

## 2016-05-05 LAB — CBG MONITORING, ED: Glucose-Capillary: 138 mg/dL — ABNORMAL HIGH (ref 65–99)

## 2016-05-05 NOTE — ED Provider Notes (Signed)
[facility] DEPT Provider Note   CSN: [id number] Arrival date & time: 05/05/16  [id number]  First Provider Contact:  None     By signing my name below, I, Evelene Croon, attest that this documentation has been prepared under the direction and in the presence of Varney Biles, MD . Electronically Signed: Evelene Croon, Scribe. 05/05/2016. 2:21 AM.   History   Chief Complaint Chief Complaint  Patient presents with  . Dizziness  . Nausea  . Fall    The history is provided by the patient. No language interpreter was used.   HPI Comments:  Angela Gross is a 65 y.o. female who presents to the Emergency Department s/p fall  complaining of dizziness and nausea. Pt states she woke up ~0000 with  dizziness and nausea. She then got out of bed to go to the bathroom, lost her balance, and fell. She denies head injury. Pt describes her dizziness as a feeling lightheaded and unsteady; denies room spinning sensation. At this time pt reports associated frontal HA. She denies h/o similar episodes. Pt went to bed ~ 2130 and was fine at that tiime. No alleviating factors noted.She also denies h/o MI and CVA, tobacco use and heavy alcohol use. No vomiting.  Past Medical History:  Diagnosis Date  . Anxiety   . Breast cancer ([place])    "right" (01/10/2014)  . Chronic left shoulder pain    "work related injury" (01/10/2014)  . Degenerative disc disease, cervical   . Fibromyalgia   . Full dentures   . Hypertension    no meds now  . Osteoarthritis of both knees   . Osteoporosis   . PONV (postoperative nausea and vomiting)   . Raynaud disease   . Rheumatoid arthritis ([place])    "elbows; fingers; toes"  . Syncope and collapse    "4 times in the last year" (01/10/2014)  . Type II diabetes mellitus (HCC)    has not taken meds in over a month (01/10/2014)  . Wears glasses     Patient Active Problem List   Diagnosis Date Noted  . Osteoporosis 02/27/2015  . Constipation 10/04/2014  . Personal history  of malignant neoplasm of breast 09/26/2014  . Arthralgia of multiple sites 06/27/2014  . Chemotherapy induced cardiomyopathy ([place]) 05/11/2014  . Hot flashes, menopausal 04/04/2014  . Chemotherapy-induced neuropathy ([place]) 03/14/2014  . Anemia, unspecified 03/14/2014  . Hypokalemia 03/01/2014  . Anxiety 01/30/2014  . Breast cancer of lower-inner quadrant of right female breast ([place]) 10/25/2013  . C-REACTIVE PROTEIN, SERUM, ELEVATED 03/07/2010  . DIABETES MELLITUS, TYPE II 03/04/2010  . ARTHRALGIA 03/04/2010    Past Surgical History:  Procedure Laterality Date  . BREAST BIOPSY Right 10/2013  . BREAST IMPLANT EXCHANGE Right 02/12/2016   Procedure: EXCHANGE SILICONE IMPLANT ON RIGHT, CAPSULORRAPHY AND PLACEMENT OF ALLODERM;  Surgeon: Irene Limbo, MD;  Location: [facility];  Service: Plastics;  Laterality: Right;  . BREAST RECONSTRUCTION Right 12/12/2014   Procedure: REVISION OF RIGHT RECONSTRUCTIVE BREAST WITH CAPSULORRHAPHY PLACEMENT OF SILCONE IMPLANTS AND LIPOFILLING TO RIGHT CHEST ;  Surgeon: Irene Limbo, MD;  Location: [place] [facility];  Service: Plastics;  Laterality: Right;  . BREAST RECONSTRUCTION Right 03/06/2015   Procedure: RIGHT NIPPLE AEROLA COMPLEX RECONSTRUCTION AND LOCAL FLAP WITH LIPO FILLING TO RIGHT BREAST,REMOVAL RIGHT CHEST PORT;  Surgeon: Irene Limbo, MD;  Location: [facility];  Service: Plastics;  Laterality: Right;  . BREAST RECONSTRUCTION WITH PLACEMENT OF TISSUE EXPANDER AND FLEX HD (ACELLULAR HYDRATED DERMIS)  Right 12/13/2013   Procedure: RIGHT BREAST RECONSTRUCTION WITH PLACEMENT OF TISSUE EXPANDER AND FLEX HD (ACELLULAR HYDRATED DERMIS);  Surgeon: Irene Limbo, MD;  Location: [facility];  Service: Plastics;  Laterality: Right;  . CHOLECYSTECTOMY  1970's  . COLONOSCOPY    . LIPOMA EXCISION Bilateral 12/13/2013   Procedure: EXCISION OF LEFT BREAST KELOID AND RIGHT CHEST KELOID;  Surgeon: Irene Limbo,  MD;  Location: [facility];  Service: Plastics;  Laterality: Bilateral;  . LIPOSUCTION WITH LIPOFILLING Right 09/26/2014   Procedure: LIPOSUCTION WITH LIPOFILLING;  Surgeon: Irene Limbo, MD;  Location: [facility];  Service: Plastics;  Laterality: Right;  . LIPOSUCTION WITH LIPOFILLING Right 12/12/2014   Procedure: LIPOSUCTION WITH LIPOFILLING;  Surgeon: Irene Limbo, MD;  Location: [facility];  Service: Plastics;  Laterality: Right;  . LIPOSUCTION WITH LIPOFILLING Right 03/06/2015   Procedure: ABDOMINAL LIPOSUCTION WITH LIPOFILLING TO RIGHT BREAST;  Surgeon: Irene Limbo, MD;  Location: [facility];  Service: Plastics;  Laterality: Right;  . MASTECTOMY    . MASTECTOMY W/ SENTINEL NODE BIOPSY Right 12/13/2013   Procedure: RIGHT TOTAL MASTECTOMY WITH SENTINEL LYMPH NODE BIOPSY;  Surgeon: Adin Hector, MD;  Location: [facility];  Service: General;  Laterality: Right;  . MASTOPEXY Left 09/26/2014   Procedure: MASTOPEXY;  Surgeon: Irene Limbo, MD;  Location: [facility];  Service: Plastics;  Laterality: Left;  . PORT-A-CATH REMOVAL Right 03/06/2015   Procedure: REMOVAL PORT-A-CATH;  Surgeon: Irene Limbo, MD;  Location: [facility];  Service: Plastics;  Laterality: Right;  . PORTACATH PLACEMENT Right 12/13/2013   Procedure: INSERTION PORT-A-CATH;  Surgeon: Adin Hector, MD;  Location: [place] [facility];  Service: General;  Laterality: Right;  . REMOVAL OF BILATERAL TISSUE EXPANDERS WITH PLACEMENT OF BILATERAL BREAST IMPLANTS Bilateral 09/26/2014   Procedure: REMOVAL OF RIGHT TISSUE EXPANDER WITH PLACEMENT OF RIGHT BREAST IMPLANT LIPOFILLING TO RIGHT BREAST/LEFT BREAST MASTOPEXY FOR SYMMETRY;  Surgeon: Irene Limbo, MD;  Location: [facility];  Service: Plastics;  Laterality: Bilateral;  . TISSUE EXPANDER PLACEMENT Right 01/10/2014   Procedure: Incision and  Drainage Right Breast Seroma with TISSUE EXPANDER exchange;  Surgeon: Irene Limbo, MD;  Location: [facility];  Service: Plastics;  Laterality: Right;  Marland Kitchen VAGINAL HYSTERECTOMY  1980   no salpingo-oophoretomu    OB History    No data available       Home Medications    Prior to Admission medications   Medication Sig Start Date End Date Taking? Authorizing Provider  anastrozole (ARIMIDEX) 1 MG tablet TAKE ONE TABLET BY MOUTH ONCE DAILY 12/13/15  Yes Laurie Panda, NP  cyclobenzaprine (FLEXERIL) 10 MG tablet Take 1 tablet (10 mg total) by mouth 3 (three) times daily as needed for muscle spasms. 03/05/16  Yes Chauncey Cruel, MD  folic acid (FOLVITE) 1 MG tablet Take 1 mg by mouth daily.   Yes Historical Provider, MD  gabapentin (NEURONTIN) 300 MG capsule Take 3 capsules (900 mg total) by mouth 3 (three) times daily. For neuropathy and hot flashes 11/07/15  Yes Marcial Pacas, MD  methotrexate (50 MG/ML) 1 G injection Inject 8 mg as directed once a week.    Yes Historical Provider, MD  traMADol (ULTRAM) 50 MG tablet Take 1 tablet (50 mg total) by mouth 3 (three) times daily as needed (pain). 03/19/16  Yes Laurie Panda, NP    Family History Family History  Problem Relation Age of Onset  .  Heart disease Mother   . Hypertension Mother   . Breast cancer Mother   . Diabetes Mother   . Congestive Heart Failure Mother   . Glaucoma Mother     Social History Social History  Substance Use Topics  . Smoking status: Never Smoker  . Smokeless tobacco: Never Used  . Alcohol use No     Allergies   Codeine and Diazepam   Review of Systems Review of Systems 10 systems reviewed and all are negative for acute change except as noted in the HPI.   Physical Exam Updated Vital Signs BP 99/71   Pulse 90   Temp 98.7 F (37.1 C) (Oral)   Resp 20   Ht 5' (1.524 m)   Wt 163 lb (73.9 kg)   SpO2 95%   BMI 31.83 kg/m   Physical Exam  Constitutional: She is oriented to person, place, and  time. She appears well-developed and well-nourished. No distress.  HENT:  Head: Normocephalic and atraumatic.  Eyes: Conjunctivae and EOM are normal. Pupils are equal, round, and reactive to light.  Pupils 3 mm and reactive  Neck: Normal range of motion.  Cardiovascular: Normal rate, regular rhythm and normal heart sounds.   Pulmonary/Chest: Effort normal and breath sounds normal. No respiratory distress.  Lungs clear to auscultation  Abdominal: Soft. There is no tenderness.  Musculoskeletal: Normal range of motion.  Neurological: She is alert and oriented to person, place, and time.  Gross sensory and motor exams for BUE and BLE are normal  Cerebellar exam finger to nose and heel to shin are normal.   Skin: Skin is warm and dry.  Psychiatric: She has a normal mood and affect.  Nursing note and vitals reviewed.    ED Treatments / Results  DIAGNOSTIC STUDIES:  Oxygen Saturation is 97% on RA, normal by my interpretation.    COORDINATION OF CARE:  2:17 AM Discussed treatment plan with pt at bedside and pt agreed to plan. 2:26 AM Pt ambulated with steady gait but still reported dizziness.   Labs (all labs ordered are listed, but only abnormal results are displayed) Labs Reviewed  CBG MONITORING, ED - Abnormal; Notable for the following:       Result Value   Glucose-Capillary 138 (*)    All other components within normal limits  CBC  DIFFERENTIAL  URINALYSIS, ROUTINE W REFLEX MICROSCOPIC (NOT AT [facility])  I-STAT CHEM 8, ED  I-STAT TROPOININ, ED    EKG  EKG Interpretation  Date/Time:  Monday May 05 2016 01:08:35 EDT Ventricular Rate:  91 PR Interval:    QRS Duration: 82 QT Interval:  356 QTC Calculation: 438 R Axis:   63 Text Interpretation:  Sinus rhythm Low voltage, precordial leads No acute changes No significant change since last tracing Confirmed by Kathrynn Humble, MD, Thelma Comp [id number]) on 05/05/2016 2:09:22 AM       Radiology No results  found.  Procedures Procedures  Medications Ordered in ED Medications - No data to display   Initial Impression / Assessment and Plan / ED Course  I have reviewed the triage vital signs and the nursing notes.  Pertinent labs & imaging results that were available during my care of the patient were reviewed by me and considered in my medical decision making (see chart for details).  Clinical Course    Pt ambulated - and she was feeling better, but still dizzy.  Final Clinical Impressions(s) / ED Diagnoses   Final diagnoses:  None    New Prescriptions  New Prescriptions   No medications on file   I personally performed the services described in this documentation, which was scribed in my presence. The recorded information has been reviewed and is accurate.  Pt comes in with cc of dizziness and nausea. She took a fall due to her symptoms. Pt feels dizzy while laying supine, but her neuro exam is non focal. Last normal 9:30 pm, so she is outside the TPA window, and thus no code stroke called. We will get MRI brain if she feels unsteady whilst ambulating. Orthostatics are neg.    Varney Biles, MD 05/05/16 [phone]

## 2016-05-05 NOTE — ED Notes (Signed)
Pt ambulated in hall unassisted with steady gait.

## 2016-05-05 NOTE — ED Notes (Signed)
Pt c/o feeling "more dizzy and nauseous" while standing during orthostatics.

## 2016-05-05 NOTE — Discharge Instructions (Signed)
We saw you in the ER for the dizziness and fall. All the results in the ER are normal, labs and imaging. No signs of new strokes  The workup in the ER is not complete, and is limited to screening for life threatening and emergent conditions only, so please see a primary care doctor for further evaluation.  Please return to the ER if your symptoms get worse, you have new one sided numbness, tingling, weakness or confusion, seizures, poor balance or poor vision.
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
## 2016-05-05 NOTE — ED Notes (Signed)
Patient transported to MRI 

## 2016-05-27 ENCOUNTER — Emergency Department (HOSPITAL_COMMUNITY): Payer: No Typology Code available for payment source

## 2016-05-27 ENCOUNTER — Encounter (HOSPITAL_COMMUNITY): Payer: Self-pay | Admitting: Emergency Medicine

## 2016-05-27 ENCOUNTER — Emergency Department (HOSPITAL_COMMUNITY)
Admission: EM | Admit: 2016-05-27 | Discharge: 2016-05-27 | Disposition: A | Discharge status: Home / Self Care | Payer: No Typology Code available for payment source | Attending: Emergency Medicine | Admitting: Emergency Medicine

## 2016-05-27 DIAGNOSIS — I10 Essential (primary) hypertension: Secondary | ICD-10-CM | POA: Insufficient documentation

## 2016-05-27 DIAGNOSIS — Z853 Personal history of malignant neoplasm of breast: Secondary | ICD-10-CM | POA: Diagnosis not present

## 2016-05-27 DIAGNOSIS — E119 Type 2 diabetes mellitus without complications: Secondary | ICD-10-CM | POA: Insufficient documentation

## 2016-05-27 DIAGNOSIS — Y999 Unspecified external cause status: Secondary | ICD-10-CM | POA: Insufficient documentation

## 2016-05-27 DIAGNOSIS — M79672 Pain in left foot: Secondary | ICD-10-CM | POA: Diagnosis not present

## 2016-05-27 DIAGNOSIS — Z79899 Other long term (current) drug therapy: Secondary | ICD-10-CM | POA: Diagnosis not present

## 2016-05-27 DIAGNOSIS — R0789 Other chest pain: Secondary | ICD-10-CM | POA: Diagnosis not present

## 2016-05-27 DIAGNOSIS — M549 Dorsalgia, unspecified: Secondary | ICD-10-CM | POA: Diagnosis not present

## 2016-05-27 DIAGNOSIS — Z041 Encounter for examination and observation following transport accident: Secondary | ICD-10-CM | POA: Insufficient documentation

## 2016-05-27 DIAGNOSIS — Y92481 Parking lot as the place of occurrence of the external cause: Secondary | ICD-10-CM | POA: Diagnosis not present

## 2016-05-27 DIAGNOSIS — Y9389 Activity, other specified: Secondary | ICD-10-CM | POA: Insufficient documentation

## 2016-05-27 MED ORDER — CYCLOBENZAPRINE HCL 10 MG PO TABS: 10.0000 mg | ORAL_TABLET | Freq: Two times a day (BID) | ORAL | 0 refills | Status: DC | PRN

## 2016-05-27 MED ORDER — IBUPROFEN 600 MG PO TABS: 600.0000 mg | ORAL_TABLET | Freq: Four times a day (QID) | ORAL | 0 refills | Status: DC | PRN

## 2016-05-27 MED ORDER — IBUPROFEN 200 MG PO TABS
600.0000 mg | ORAL_TABLET | Freq: Once | ORAL | Status: AC
  Administered 2016-05-27: 600 mg via ORAL
  Filled 2016-05-27: qty 3

## 2016-05-27 MED ORDER — CYCLOBENZAPRINE HCL 10 MG PO TABS
5.0000 mg | ORAL_TABLET | Freq: Once | ORAL | Status: AC
  Administered 2016-05-27: 5 mg via ORAL
  Filled 2016-05-27: qty 1

## 2016-05-27 NOTE — ED Notes (Signed)
ED PA at bedside
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
## 2016-05-27 NOTE — Discharge Instructions (Signed)
Take your medications as prescribed and for pain relief. I also recommend resting and applying ice to affected area for 15-20 with 3-4 times daily. Follow-up with your family doctor of your symptoms have not improved over the next week. Return to the emergency department if symptoms worsen or new onset of fever, numbness, tingling, saddle anesthesia, loss of bowel or bladder, weakness.
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
## 2016-06-08 ENCOUNTER — Emergency Department (HOSPITAL_COMMUNITY)
Admission: EM | Admit: 2016-06-08 | Discharge: 2016-06-08 | Disposition: A | Discharge status: Home / Self Care | Payer: Medicare HMO | Attending: Emergency Medicine | Admitting: Emergency Medicine

## 2016-06-08 ENCOUNTER — Emergency Department (HOSPITAL_COMMUNITY): Payer: Medicare HMO

## 2016-06-08 ENCOUNTER — Encounter (HOSPITAL_COMMUNITY): Payer: Self-pay

## 2016-06-08 DIAGNOSIS — M545 Low back pain, unspecified: Secondary | ICD-10-CM

## 2016-06-08 DIAGNOSIS — S93402A Sprain of unspecified ligament of left ankle, initial encounter: Secondary | ICD-10-CM

## 2016-06-08 DIAGNOSIS — Y999 Unspecified external cause status: Secondary | ICD-10-CM | POA: Insufficient documentation

## 2016-06-08 DIAGNOSIS — I10 Essential (primary) hypertension: Secondary | ICD-10-CM | POA: Insufficient documentation

## 2016-06-08 DIAGNOSIS — E119 Type 2 diabetes mellitus without complications: Secondary | ICD-10-CM | POA: Diagnosis not present

## 2016-06-08 DIAGNOSIS — Y939 Activity, unspecified: Secondary | ICD-10-CM | POA: Diagnosis not present

## 2016-06-08 DIAGNOSIS — S99912A Unspecified injury of left ankle, initial encounter: Secondary | ICD-10-CM | POA: Diagnosis present

## 2016-06-08 DIAGNOSIS — Z853 Personal history of malignant neoplasm of breast: Secondary | ICD-10-CM | POA: Diagnosis not present

## 2016-06-08 DIAGNOSIS — Y92410 Unspecified street and highway as the place of occurrence of the external cause: Secondary | ICD-10-CM | POA: Diagnosis not present

## 2016-06-08 DIAGNOSIS — M25579 Pain in unspecified ankle and joints of unspecified foot: Secondary | ICD-10-CM

## 2016-06-08 NOTE — ED Notes (Addendum)
Pt is ambulatory. States was given Motrin and muscle relaxer at [facility] last week but pain continues. Pt changing into gown.

## 2016-06-08 NOTE — ED Notes (Addendum)
Transported to x-ray via w/c. Warm blanket given.

## 2016-06-08 NOTE — ED Notes (Signed)
Declined crutches 

## 2016-06-08 NOTE — Progress Notes (Signed)
Orthopedic Tech Progress Note Patient Details:  Angela Gross 09-02-1951 [id number]  Ortho Devices Type of Ortho Device: ASO Ortho Device/Splint Interventions: Application   [place] Pink 06/08/2016, 2:27 PM
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
## 2016-07-08 ENCOUNTER — Telehealth: Payer: Self-pay | Admitting: *Deleted

## 2016-07-08 NOTE — Telephone Encounter (Signed)
This RN received VM from pt left at 1507 and transferred to this RN's vm at 1609.  Message stated - she is waiting on refill for flexeril and tramadol " I called the other day and now I am at the pharmacy and they don't have them "  This RN reviewed chart - drugs are not related to pt's history of breast cancer - need for refill needs to be requested by PCP or due to noted history of RA - her rheumatologist.  This RN returned call to pt and obtained identified VM- message left per above.

## 2016-07-11 ENCOUNTER — Other Ambulatory Visit: Payer: Self-pay | Admitting: Family Medicine

## 2016-07-11 DIAGNOSIS — E2839 Other primary ovarian failure: Secondary | ICD-10-CM

## 2016-07-28 ENCOUNTER — Other Ambulatory Visit: Payer: Self-pay | Admitting: *Deleted

## 2016-07-28 DIAGNOSIS — C50411 Malignant neoplasm of upper-outer quadrant of right female breast: Secondary | ICD-10-CM

## 2016-07-28 MED ORDER — ANASTROZOLE 1 MG PO TABS: ORAL_TABLET | ORAL | 7 refills | Status: DC

## 2016-07-28 NOTE — Telephone Encounter (Signed)
Patient called from Pharmacy reporting a refill was faxed to [facility] for anastrozole.  This nurse sent refill through next scheduled office visit.

## 2016-07-31 ENCOUNTER — Encounter (HOSPITAL_COMMUNITY): Payer: Self-pay | Admitting: Emergency Medicine

## 2016-07-31 ENCOUNTER — Other Ambulatory Visit: Payer: Self-pay | Admitting: *Deleted

## 2016-07-31 ENCOUNTER — Emergency Department (HOSPITAL_COMMUNITY): Payer: Medicare HMO

## 2016-07-31 ENCOUNTER — Emergency Department (HOSPITAL_COMMUNITY)
Admission: EM | Admit: 2016-07-31 | Discharge: 2016-07-31 | Disposition: A | Discharge status: Home / Self Care | Payer: Medicare HMO | Attending: Emergency Medicine | Admitting: Emergency Medicine

## 2016-07-31 DIAGNOSIS — Z853 Personal history of malignant neoplasm of breast: Secondary | ICD-10-CM | POA: Insufficient documentation

## 2016-07-31 DIAGNOSIS — I10 Essential (primary) hypertension: Secondary | ICD-10-CM | POA: Insufficient documentation

## 2016-07-31 DIAGNOSIS — Z79899 Other long term (current) drug therapy: Secondary | ICD-10-CM | POA: Insufficient documentation

## 2016-07-31 DIAGNOSIS — M79671 Pain in right foot: Secondary | ICD-10-CM | POA: Diagnosis not present

## 2016-07-31 DIAGNOSIS — E119 Type 2 diabetes mellitus without complications: Secondary | ICD-10-CM | POA: Insufficient documentation

## 2016-07-31 LAB — BASIC METABOLIC PANEL
ANION GAP: 7 (ref 5–15)
BUN: 10 mg/dL (ref 6–20)
CHLORIDE: 104 mmol/L (ref 101–111)
CO2: 25 mmol/L (ref 22–32)
Calcium: 9.1 mg/dL (ref 8.9–10.3)
Creatinine, Ser: 0.93 mg/dL (ref 0.44–1.00)
GFR calc non Af Amer: >60 mL/min (ref >60)
Glucose, Bld: 106 mg/dL — ABNORMAL HIGH (ref 65–99)
POTASSIUM: 3.9 mmol/L (ref 3.5–5.1)
SODIUM: 136 mmol/L (ref 135–145)

## 2016-07-31 LAB — CBC WITH DIFFERENTIAL/PLATELET
BASOS PCT: 0 %
Basophils Absolute: 0 10*3/uL (ref 0.0–0.1)
EOS ABS: 0.1 10*3/uL (ref 0.0–0.7)
Eosinophils Relative: 1 %
HCT: 39 % (ref 36.0–46.0)
HEMOGLOBIN: 12.5 g/dL (ref 12.0–15.0)
Lymphocytes Relative: 16 %
Lymphs Abs: 1.5 10*3/uL (ref 0.7–4.0)
MCH: 26.3 pg (ref 26.0–34.0)
MCHC: 32.1 g/dL (ref 30.0–36.0)
MCV: 81.9 fL (ref 78.0–100.0)
Monocytes Absolute: 0.9 10*3/uL (ref 0.1–1.0)
Monocytes Relative: 9 %
NEUTROS PCT: 74 %
Neutro Abs: 7 10*3/uL (ref 1.7–7.7)
Platelets: 132 10*3/uL — ABNORMAL LOW (ref 150–400)
RBC: 4.76 MIL/uL (ref 3.87–5.11)
RDW: 14.7 % (ref 11.5–15.5)
WBC: 9.5 10*3/uL (ref 4.0–10.5)

## 2016-07-31 MED ORDER — NAPROXEN 500 MG PO TABS: 500.0000 mg | ORAL_TABLET | Freq: Two times a day (BID) | ORAL | 0 refills | Status: DC

## 2016-07-31 MED ORDER — OXYCODONE-ACETAMINOPHEN 5-325 MG PO TABS
1.0000 | ORAL_TABLET | Freq: Once | ORAL | Status: AC
  Administered 2016-07-31: 1 via ORAL
  Filled 2016-07-31: qty 1

## 2016-07-31 MED ORDER — OXYCODONE-ACETAMINOPHEN 5-325 MG PO TABS: 1.0000 | ORAL_TABLET | Freq: Four times a day (QID) | ORAL | 0 refills | Status: DC | PRN

## 2016-07-31 NOTE — ED Notes (Signed)
PA at bedside.
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
## 2016-07-31 NOTE — ED Provider Notes (Signed)
[facility] DEPT Provider Note   CSN: [id number] Arrival date & time: 07/31/16  0535     History   Chief Complaint Chief Complaint  Patient presents with  . Foot Pain    HPI Angela Gross is a 65 y.o. female.  HPI Angela Gross is a 65 y.o. female with PMH significant for breast cancer in remission, DM, osteoporosis, HTN, fibromyalgia, and RA who presents with gradual onset, constant, moderate right foot and ankle pain with associated warmth, swelling, and tingling to right 3-5th toes since 10 AM yesterday.  She denies any known injury.  She has not taken anything for pain.  She has not been ambulatory. No fever, chills, N/V/D.  No prior ankle surgery.   Past Medical History:  Diagnosis Date  . Anxiety   . Breast cancer ([place])    "right" (01/10/2014)  . Chronic left shoulder pain    "work related injury" (01/10/2014)  . Degenerative disc disease, cervical   . Fibromyalgia   . Full dentures   . Hypertension    no meds now  . Osteoarthritis of both knees   . Osteoporosis   . PONV (postoperative nausea and vomiting)   . Raynaud disease   . Rheumatoid arthritis ([place])    "elbows; fingers; toes"  . Syncope and collapse    "4 times in the last year" (01/10/2014)  . Type II diabetes mellitus ([facility])    has not taken meds in over a month (01/10/2014)  . Wears glasses     Patient Active Problem List   Diagnosis Date Noted  . Osteoporosis 02/27/2015  . Constipation 10/04/2014  . Personal history of malignant neoplasm of breast 09/26/2014  . Arthralgia of multiple sites 06/27/2014  . 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 Hot flashes, menopausal 04/04/2014  . 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 Anemia, unspecified 03/14/2014  . Hypokalemia 03/01/2014  . Anxiety 01/30/2014  . Breast cancer of lower-inner quadrant of right female breast ([place]) 10/25/2013  . C-REACTIVE PROTEIN, SERUM, ELEVATED 03/07/2010  . DIABETES MELLITUS, TYPE II  03/04/2010  . ARTHRALGIA 03/04/2010    Past Surgical History:  Procedure Laterality Date  . BREAST BIOPSY Right 10/2013  . BREAST IMPLANT EXCHANGE Right 02/12/2016   Procedure: EXCHANGE SILICONE IMPLANT ON RIGHT, CAPSULORRAPHY AND PLACEMENT OF ALLODERM;  Surgeon: Irene Limbo, MD;  Location: [facility];  Service: Plastics;  Laterality: Right;  . BREAST RECONSTRUCTION Right 12/12/2014   Procedure: REVISION OF RIGHT RECONSTRUCTIVE BREAST WITH CAPSULORRHAPHY PLACEMENT OF SILCONE IMPLANTS AND LIPOFILLING TO RIGHT CHEST ;  Surgeon: Irene Limbo, MD;  Location: [facility];  Service: Plastics;  Laterality: Right;  . BREAST RECONSTRUCTION Right 03/06/2015   Procedure: RIGHT NIPPLE AEROLA COMPLEX RECONSTRUCTION AND LOCAL FLAP WITH LIPO FILLING TO RIGHT BREAST,REMOVAL RIGHT CHEST PORT;  Surgeon: Irene Limbo, MD;  Location: [facility];  Service: Plastics;  Laterality: Right;  . BREAST RECONSTRUCTION WITH PLACEMENT OF TISSUE EXPANDER AND FLEX HD (ACELLULAR HYDRATED DERMIS) Right 12/13/2013   Procedure: RIGHT BREAST RECONSTRUCTION WITH PLACEMENT OF TISSUE EXPANDER AND FLEX HD (ACELLULAR HYDRATED DERMIS);  Surgeon: Irene Limbo, MD;  Location: [facility];  Service: Plastics;  Laterality: Right;  . CHOLECYSTECTOMY  1970's  . COLONOSCOPY    . LIPOMA EXCISION Bilateral 12/13/2013   Procedure: EXCISION OF LEFT BREAST KELOID AND RIGHT CHEST KELOID;  Surgeon: Irene Limbo, MD;  Location: [facility];  Service: Plastics;  Laterality: Bilateral;  . LIPOSUCTION WITH LIPOFILLING  Right 09/26/2014   Procedure: LIPOSUCTION WITH LIPOFILLING;  Surgeon: Irene Limbo, MD;  Location: [facility];  Service: Plastics;  Laterality: Right;  . LIPOSUCTION WITH LIPOFILLING Right 12/12/2014   Procedure: LIPOSUCTION WITH LIPOFILLING;  Surgeon: Irene Limbo, MD;  Location: [facility];  Service: Plastics;  Laterality:  Right;  . LIPOSUCTION WITH LIPOFILLING Right 03/06/2015   Procedure: ABDOMINAL LIPOSUCTION WITH LIPOFILLING TO RIGHT BREAST;  Surgeon: Irene Limbo, MD;  Location: [facility];  Service: Plastics;  Laterality: Right;  . MASTECTOMY    . MASTECTOMY W/ SENTINEL NODE BIOPSY Right 12/13/2013   Procedure: RIGHT TOTAL MASTECTOMY WITH SENTINEL LYMPH NODE BIOPSY;  Surgeon: Adin Hector, MD;  Location: [facility];  Service: General;  Laterality: Right;  . MASTOPEXY Left 09/26/2014   Procedure: MASTOPEXY;  Surgeon: Irene Limbo, MD;  Location: [facility];  Service: Plastics;  Laterality: Left;  . PORT-A-CATH REMOVAL Right 03/06/2015   Procedure: REMOVAL PORT-A-CATH;  Surgeon: Irene Limbo, MD;  Location: [facility];  Service: Plastics;  Laterality: Right;  . PORTACATH PLACEMENT Right 12/13/2013   Procedure: INSERTION PORT-A-CATH;  Surgeon: Adin Hector, MD;  Location: [facility];  Service: General;  Laterality: Right;  . REMOVAL OF BILATERAL TISSUE EXPANDERS WITH PLACEMENT OF BILATERAL BREAST IMPLANTS Bilateral 09/26/2014   Procedure: REMOVAL OF RIGHT TISSUE EXPANDER WITH PLACEMENT OF RIGHT BREAST IMPLANT LIPOFILLING TO RIGHT BREAST/LEFT BREAST MASTOPEXY FOR SYMMETRY;  Surgeon: Irene Limbo, MD;  Location: [facility];  Service: Plastics;  Laterality: Bilateral;  . TISSUE EXPANDER PLACEMENT Right 01/10/2014   Procedure: Incision and Drainage Right Breast Seroma with TISSUE EXPANDER exchange;  Surgeon: Irene Limbo, MD;  Location: [facility];  Service: Plastics;  Laterality: Right;  Marland Kitchen VAGINAL HYSTERECTOMY  1980   no salpingo-oophoretomu    OB History    No data available       Home Medications    Prior to Admission medications   Medication Sig Start Date End Date Taking? Authorizing Provider  anastrozole (ARIMIDEX) 1 MG tablet TAKE 1mg  TABLET BY MOUTH ONCE DAILY 07/28/16  Yes Chauncey Cruel,  MD  cyclobenzaprine (FLEXERIL) 10 MG tablet Take 1 tablet (10 mg total) by mouth 2 (two) times daily as needed for muscle spasms. 05/27/16  Yes Nona Dell, PA-C  gabapentin (NEURONTIN) 300 MG capsule Take 3 capsules (900 mg total) by mouth 3 (three) times daily. For neuropathy and hot flashes 11/07/15  Yes Marcial Pacas, MD  leflunomide (ARAVA) 20 MG tablet Take 1 tablet by mouth daily. 07/11/16  Yes Historical Provider, MD  cyclobenzaprine (FLEXERIL) 10 MG tablet Take 1 tablet (10 mg total) by mouth 3 (three) times daily as needed for muscle spasms. Patient not taking: Reported on 07/31/2016 03/05/16   Chauncey Cruel, MD  ibuprofen (ADVIL,MOTRIN) 600 MG tablet Take 1 tablet (600 mg total) by mouth every 6 (six) hours as needed. Patient not taking: Reported on 07/31/2016 05/27/16   Nona Dell, PA-C  naproxen (NAPROSYN) 500 MG tablet Take 1 tablet (500 mg total) by mouth 2 (two) times daily. 07/31/16   Gloriann Loan, PA-C  oxyCODONE-acetaminophen (PERCOCET/ROXICET) 5-325 MG tablet Take 1 tablet by mouth every 6 (six) hours as needed for severe pain. 07/31/16   Gloriann Loan, PA-C    Family History Family History  Problem Relation Age of Onset  . Heart disease Mother   . Hypertension Mother   . Breast cancer Mother   .  Diabetes Mother   . Congestive Heart Failure Mother   . Glaucoma Mother     Social History Social History  Substance Use Topics  . Smoking status: Never Smoker  . Smokeless tobacco: Never Used  . Alcohol use No     Allergies   Codeine and Diazepam   Review of Systems Review of Systems All other systems negative unless otherwise stated in HPI   Physical Exam Updated Vital Signs BP 116/73 (BP Location: Left Arm)   Pulse 99   Temp 98.6 F (37 C) (Oral)   Resp 18   SpO2 93%   Physical Exam  Constitutional: She is oriented to person, place, and time. She appears well-developed and well-nourished.  HENT:  Head: Normocephalic and atraumatic.    Right Ear: External ear normal.  Left Ear: External ear normal.  Eyes: Conjunctivae are normal. No scleral icterus.  Neck: No tracheal deviation present.  Cardiovascular:  Pulses:      Dorsalis pedis pulses are 2+ on the right side, and 2+ on the left side.  Brisk capillary refill.   Pulmonary/Chest: Effort normal. No respiratory distress.  Abdominal: She exhibits no distension.  Musculoskeletal: Normal range of motion.  Right dorsal foot with swelling and warmth.  No erythema or bruising.  Significantly TTP along lateral dorsal foot.  Able to move all does and ankle in dorsi/plantar flexion, but with pain.  Neurological: She is alert and oriented to person, place, and time.  Decreased strength due to pain.  Sensation intact, but reported decreased in right 3-5th toes.   Skin: Skin is warm and dry. Capillary refill takes less than 2 seconds. No rash noted. No erythema.  Psychiatric: She has a normal mood and affect. Her behavior is normal.     ED Treatments / Results  Labs (all labs ordered are listed, but only abnormal results are displayed) Labs Reviewed  CBC WITH DIFFERENTIAL/PLATELET - Abnormal; Notable for the following:       Result Value   Platelets 132 (*)    All other components within normal limits  BASIC METABOLIC PANEL - Abnormal; Notable for the following:    Glucose, Bld 106 (*)    All other components within normal limits    EKG  EKG Interpretation None       Radiology Dg Ankle Complete Right  Result Date: 07/31/2016 CLINICAL DATA:  Initial evaluation for acute ankle pain. No known injury. EXAM: RIGHT ANKLE - COMPLETE 3+ VIEW COMPARISON:  None. FINDINGS: No acute fracture or dislocation. No joint effusion. Ankle mortise approximated. Talar dome intact. No significant soft tissue swelling about the ankle. Plantar calcaneal enthesophyte noted. Osseous mineralization normal. IMPRESSION: No acute osseous abnormality about the right ankle. Electronically Signed    By: Jeannine Boga M.D.   On: 07/31/2016 06:29   Dg Foot Complete Right  Result Date: 07/31/2016 CLINICAL DATA:  Initial evaluation for acute ankle pain. No known injury. EXAM: RIGHT FOOT COMPLETE - 3+ VIEW COMPARISON:  None. FINDINGS: No acute fracture dislocation. Joint spaces maintained without evidence for significant degenerative or erosive arthropathy. No soft tissue abnormality. Plantar calcaneal enthesophyte noted. Mild osteopenia. IMPRESSION: No acute osseous abnormality about the right foot. Electronically Signed   By: Jeannine Boga M.D.   On: 07/31/2016 06:31    Procedures Procedures (including critical care time)  Medications Ordered in ED Medications  oxyCODONE-acetaminophen (PERCOCET/ROXICET) 5-325 MG per tablet 1 tablet (1 tablet Oral Given 07/31/16 [id number])     Initial Impression / Assessment and  Plan / ED Course  I have reviewed the triage vital signs and the nursing notes.  Pertinent labs & imaging results that were available during my care of the patient were reviewed by me and considered in my medical decision making (see chart for details).  Clinical Course  Patient presents with right foot pain with associated swelling and warmth.  There is no erythema, induration, or fluctuance.  No systemic symptoms.  VSS, NAD.  NVI.  Plain films unremarkable.  Consider acute arterial thrombus, but good DP pulses and perfusion of right foot.  Consider venous thrombus, but only lateral swelling of right foot and no color changes.  Unlikely to be gout since not over a joint and no erythema. Patient given crutches and ACE wrap in ED.  Home with Naprosyn and close PCP follow up.  Return precautions discussed.  Stable for discharge.   Final Clinical Impressions(s) / ED Diagnoses   Final diagnoses:  Foot pain, right    New Prescriptions Discharge Medication List as of 07/31/2016  7:14 AM    START taking these medications   Details  naproxen (NAPROSYN) 500 MG tablet  Take 1 tablet (500 mg total) by mouth 2 (two) times daily., Starting Thu 07/31/2016, Print    oxyCODONE-acetaminophen (PERCOCET/ROXICET) 5-325 MG tablet Take 1 tablet by mouth every 6 (six) hours as needed for severe pain., Starting Thu 07/31/2016, Print         Gloriann Loan, PA-C 07/31/16 [place], MD 08/08/16 [id number]

## 2016-07-31 NOTE — ED Notes (Signed)
Pt in X-Ray ?

## 2016-07-31 NOTE — ED Triage Notes (Signed)
Pt BIB [facility] for c/o of right foot pain; pain began yesterday at 10am and has gradually worsened since; pain begins right above toes and radiates through ankle to beginning of shin; able to wiggle toes; PMS intact

## 2016-08-25 ENCOUNTER — Other Ambulatory Visit: Payer: Self-pay | Admitting: *Deleted

## 2016-08-25 ENCOUNTER — Telehealth: Payer: Self-pay | Admitting: *Deleted

## 2016-08-25 MED ORDER — TRAMADOL HCL 50 MG PO TABS: 50.0000 mg | ORAL_TABLET | Freq: Four times a day (QID) | ORAL | 1 refills | Status: DC | PRN

## 2016-08-25 NOTE — Telephone Encounter (Signed)
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  Refill will be requested to appropriate pharmacy.  Message left on pt's vm per above

## 2016-09-02 ENCOUNTER — Other Ambulatory Visit: Payer: Self-pay | Admitting: *Deleted

## 2016-09-02 DIAGNOSIS — C50311 Malignant neoplasm of lower-inner quadrant of right female breast: Secondary | ICD-10-CM

## 2016-09-03 ENCOUNTER — Other Ambulatory Visit: Payer: Self-pay | Admitting: *Deleted

## 2016-09-03 ENCOUNTER — Other Ambulatory Visit (HOSPITAL_BASED_OUTPATIENT_CLINIC_OR_DEPARTMENT_OTHER): Payer: Medicare HMO

## 2016-09-03 ENCOUNTER — Ambulatory Visit (HOSPITAL_BASED_OUTPATIENT_CLINIC_OR_DEPARTMENT_OTHER): Payer: Medicare HMO

## 2016-09-03 VITALS — BP 123/85 | HR 91 | Temp 98.4°F | Resp 20

## 2016-09-03 DIAGNOSIS — M810 Age-related osteoporosis without current pathological fracture: Secondary | ICD-10-CM

## 2016-09-03 DIAGNOSIS — C50811 Malignant neoplasm of overlapping sites of right female breast: Secondary | ICD-10-CM | POA: Diagnosis not present

## 2016-09-03 DIAGNOSIS — C50311 Malignant neoplasm of lower-inner quadrant of right female breast: Secondary | ICD-10-CM

## 2016-09-03 LAB — CBC WITH DIFFERENTIAL/PLATELET
BASO%: 0.2 % (ref 0.0–2.0)
BASOS ABS: 0 10*3/uL (ref 0.0–0.1)
EOS%: 1.8 % (ref 0.0–7.0)
Eosinophils Absolute: 0.2 10*3/uL (ref 0.0–0.5)
HEMATOCRIT: 43.1 % (ref 34.8–46.6)
HEMOGLOBIN: 14.2 g/dL (ref 11.6–15.9)
LYMPH#: 2.6 10*3/uL (ref 0.9–3.3)
LYMPH%: 30.1 % (ref 14.0–49.7)
MCH: 26.6 pg (ref 25.1–34.0)
MCHC: 32.9 g/dL (ref 31.5–36.0)
MCV: 80.7 fL (ref 79.5–101.0)
MONO#: 0.9 10*3/uL (ref 0.1–0.9)
MONO%: 10.5 % (ref 0.0–14.0)
NEUT#: 5 10*3/uL (ref 1.5–6.5)
NEUT%: 57.4 % (ref 38.4–76.8)
NRBC: 0 % (ref 0–0)
Platelets: 129 10*3/uL — ABNORMAL LOW (ref 145–400)
RBC: 5.34 10*6/uL (ref 3.70–5.45)
RDW: 15 % — AB (ref 11.2–14.5)
WBC: 8.7 10*3/uL (ref 3.9–10.3)

## 2016-09-03 LAB — COMPREHENSIVE METABOLIC PANEL
ALBUMIN: 3.5 g/dL (ref 3.5–5.0)
ALK PHOS: 152 U/L — AB (ref 40–150)
ALT: 23 U/L (ref 0–55)
AST: 31 U/L (ref 5–34)
Anion Gap: 12 mEq/L — ABNORMAL HIGH (ref 3–11)
BILIRUBIN TOTAL: 0.25 mg/dL (ref 0.20–1.20)
BUN: 14.2 mg/dL (ref 7.0–26.0)
CO2: 21 mEq/L — ABNORMAL LOW (ref 22–29)
Calcium: 9.8 mg/dL (ref 8.4–10.4)
Chloride: 108 mEq/L (ref 98–109)
Creatinine: 0.9 mg/dL (ref 0.6–1.1)
EGFR: 79 mL/min/{1.73_m2} — ABNORMAL LOW (ref >90)
GLUCOSE: 97 mg/dL (ref 70–140)
Potassium: 4.1 mEq/L (ref 3.5–5.1)
SODIUM: 140 meq/L (ref 136–145)
TOTAL PROTEIN: 8.2 g/dL (ref 6.4–8.3)

## 2016-09-03 MED ORDER — DENOSUMAB 60 MG/ML ~~LOC~~ SOLN
60.0000 mg | Freq: Once | SUBCUTANEOUS | Status: AC
  Administered 2016-09-03: 60 mg via SUBCUTANEOUS
  Filled 2016-09-03: qty 1

## 2016-09-03 NOTE — Patient Instructions (Signed)
Denosumab injection What is this medicine? DENOSUMAB (den oh sue mab) slows bone breakdown. Prolia is used to treat osteoporosis in women after menopause and in men. Xgeva is used to prevent bone fractures and other bone problems caused by cancer bone metastases. Xgeva is also used to treat giant cell tumor of the bone. COMMON BRAND NAME(S): Prolia, XGEVA What should I tell my health care provider before I take this medicine? They need to know if you have any of these conditions: -dental disease -eczema -infection or history of infections -kidney disease or on dialysis -low blood calcium or vitamin D -malabsorption syndrome -scheduled to have surgery or tooth extraction -taking medicine that contains denosumab -thyroid or parathyroid disease -an unusual reaction to denosumab, other medicines, foods, dyes, or preservatives -pregnant or trying to get pregnant -breast-feeding How should I use this medicine? This medicine is for injection under the skin. It is given by a health care professional in a hospital or clinic setting. If you are getting Prolia, a special MedGuide will be given to you by the pharmacist with each prescription and refill. Be sure to read this information carefully each time. For Prolia, talk to your pediatrician regarding the use of this medicine in children. Special care may be needed. For Xgeva, talk to your pediatrician regarding the use of this medicine in children. While this drug may be prescribed for children as young as 13 years for selected conditions, precautions do apply. What if I miss a dose? It is important not to miss your dose. Call your doctor or health care professional if you are unable to keep an appointment. What may interact with this medicine? Do not take this medicine with any of the following medications: -other medicines containing denosumab This medicine may also interact with the following medications: -medicines that suppress the immune  system -medicines that treat cancer -steroid medicines like prednisone or cortisone What should I watch for while using this medicine? Visit your doctor or health care professional for regular checks on your progress. Your doctor or health care professional may order blood tests and other tests to see how you are doing. Call your doctor or health care professional if you get a cold or other infection while receiving this medicine. Do not treat yourself. This medicine may decrease your body's ability to fight infection. You should make sure you get enough calcium and vitamin D while you are taking this medicine, unless your doctor tells you not to. Discuss the foods you eat and the vitamins you take with your health care professional. See your dentist regularly. Brush and floss your teeth as directed. Before you have any dental work done, tell your dentist you are receiving this medicine. Do not become pregnant while taking this medicine or for 5 months after stopping it. Women should inform their doctor if they wish to become pregnant or think they might be pregnant. There is a potential for serious side effects to an unborn child. Talk to your health care professional or pharmacist for more information. What side effects may I notice from receiving this medicine? Side effects that you should report to your doctor or health care professional as soon as possible: -allergic reactions like skin rash, itching or hives, swelling of the face, lips, or tongue -breathing problems -chest pain -fast, irregular heartbeat -feeling faint or lightheaded, falls -fever, chills, or any other sign of infection -muscle spasms, tightening, or twitches -numbness or tingling -skin blisters or bumps, or is dry, peels, or red -slow   healing or unexplained pain in the mouth or jaw -unusual bleeding or bruising Side effects that usually do not require medical attention (report to your doctor or health care professional  if they continue or are bothersome): -muscle pain -stomach upset, gas Where should I keep my medicine? This medicine is only given in a clinic, doctor's office, or other health care setting and will not be stored at home.  2017 [employer]vier/Gold Standard (2015-11-01 10:06:55)  

## 2017-01-01 ENCOUNTER — Other Ambulatory Visit: Payer: Self-pay | Admitting: *Deleted

## 2017-01-01 MED ORDER — TRAMADOL HCL 50 MG PO TABS: 50.0000 mg | ORAL_TABLET | Freq: Four times a day (QID) | ORAL | 1 refills | Status: DC | PRN

## 2017-01-16 ENCOUNTER — Other Ambulatory Visit: Payer: Self-pay | Admitting: *Deleted

## 2017-01-16 MED ORDER — TRAMADOL HCL 50 MG PO TABS: 50.0000 mg | ORAL_TABLET | Freq: Four times a day (QID) | ORAL | 1 refills | Status: DC | PRN

## 2017-03-02 ENCOUNTER — Other Ambulatory Visit: Payer: Self-pay | Admitting: *Deleted

## 2017-03-02 DIAGNOSIS — C50311 Malignant neoplasm of lower-inner quadrant of right female breast: Secondary | ICD-10-CM

## 2017-03-03 ENCOUNTER — Ambulatory Visit: Payer: Medicare HMO | Admitting: Oncology

## 2017-03-03 ENCOUNTER — Ambulatory Visit: Payer: Medicare HMO

## 2017-03-03 ENCOUNTER — Other Ambulatory Visit: Payer: Medicare HMO

## 2017-03-11 ENCOUNTER — Other Ambulatory Visit (HOSPITAL_BASED_OUTPATIENT_CLINIC_OR_DEPARTMENT_OTHER): Payer: Medicare HMO

## 2017-03-11 ENCOUNTER — Ambulatory Visit (HOSPITAL_BASED_OUTPATIENT_CLINIC_OR_DEPARTMENT_OTHER): Payer: Medicare HMO

## 2017-03-11 ENCOUNTER — Ambulatory Visit (HOSPITAL_BASED_OUTPATIENT_CLINIC_OR_DEPARTMENT_OTHER): Payer: Medicare HMO | Admitting: Oncology

## 2017-03-11 VITALS — BP 144/95 | HR 92 | Temp 97.8°F | Resp 18 | Ht 60.0 in | Wt 153.2 lb

## 2017-03-11 DIAGNOSIS — M810 Age-related osteoporosis without current pathological fracture: Secondary | ICD-10-CM

## 2017-03-11 DIAGNOSIS — M069 Rheumatoid arthritis, unspecified: Secondary | ICD-10-CM | POA: Diagnosis not present

## 2017-03-11 DIAGNOSIS — C50811 Malignant neoplasm of overlapping sites of right female breast: Secondary | ICD-10-CM | POA: Diagnosis not present

## 2017-03-11 DIAGNOSIS — C50411 Malignant neoplasm of upper-outer quadrant of right female breast: Secondary | ICD-10-CM

## 2017-03-11 DIAGNOSIS — C50311 Malignant neoplasm of lower-inner quadrant of right female breast: Secondary | ICD-10-CM

## 2017-03-11 DIAGNOSIS — Z170 Estrogen receptor positive status [ER+]: Secondary | ICD-10-CM

## 2017-03-11 DIAGNOSIS — N951 Menopausal and female climacteric states: Secondary | ICD-10-CM

## 2017-03-11 DIAGNOSIS — G620 Drug-induced polyneuropathy: Secondary | ICD-10-CM

## 2017-03-11 LAB — CBC WITH DIFFERENTIAL/PLATELET
BASO%: 0.5 % (ref 0.0–2.0)
BASOS ABS: 0.1 10*3/uL (ref 0.0–0.1)
EOS ABS: 0.1 10*3/uL (ref 0.0–0.5)
EOS%: 1.2 % (ref 0.0–7.0)
HEMATOCRIT: 39.8 % (ref 34.8–46.6)
HGB: 12.7 g/dL (ref 11.6–15.9)
LYMPH#: 1.9 10*3/uL (ref 0.9–3.3)
LYMPH%: 17 % (ref 14.0–49.7)
MCH: 26.6 pg (ref 25.1–34.0)
MCHC: 31.9 g/dL (ref 31.5–36.0)
MCV: 83.6 fL (ref 79.5–101.0)
MONO#: 0.6 10*3/uL (ref 0.1–0.9)
MONO%: 5.2 % (ref 0.0–14.0)
NEUT#: 8.7 10*3/uL — ABNORMAL HIGH (ref 1.5–6.5)
NEUT%: 76.1 % (ref 38.4–76.8)
Platelets: 142 10*3/uL — ABNORMAL LOW (ref 145–400)
RBC: 4.77 10*6/uL (ref 3.70–5.45)
RDW: 14.9 % — ABNORMAL HIGH (ref 11.2–14.5)
WBC: 11.4 10*3/uL — ABNORMAL HIGH (ref 3.9–10.3)

## 2017-03-11 LAB — COMPREHENSIVE METABOLIC PANEL
ALBUMIN: 3.6 g/dL (ref 3.5–5.0)
ALK PHOS: 125 U/L (ref 40–150)
ALT: 12 U/L (ref 0–55)
ANION GAP: 9 meq/L (ref 3–11)
AST: 17 U/L (ref 5–34)
BUN: 14.4 mg/dL (ref 7.0–26.0)
CALCIUM: 9.5 mg/dL (ref 8.4–10.4)
CHLORIDE: 105 meq/L (ref 98–109)
CO2: 26 mEq/L (ref 22–29)
Creatinine: 1 mg/dL (ref 0.6–1.1)
EGFR: 71 mL/min/{1.73_m2} — AB (ref >90)
Glucose: 155 mg/dl — ABNORMAL HIGH (ref 70–140)
POTASSIUM: 3.8 meq/L (ref 3.5–5.1)
Sodium: 140 mEq/L (ref 136–145)
Total Bilirubin: 0.28 mg/dL (ref 0.20–1.20)
Total Protein: 7.2 g/dL (ref 6.4–8.3)

## 2017-03-11 MED ORDER — DENOSUMAB 60 MG/ML ~~LOC~~ SOLN
60.0000 mg | Freq: Once | SUBCUTANEOUS | Status: DC
  Filled 2017-03-11: qty 1

## 2017-03-11 MED ORDER — ANASTROZOLE 1 MG PO TABS: ORAL_TABLET | ORAL | 7 refills | Status: DC

## 2017-03-11 MED ORDER — CYCLOBENZAPRINE HCL 10 MG PO TABS: 10.0000 mg | ORAL_TABLET | Freq: Two times a day (BID) | ORAL | 0 refills | Status: DC | PRN

## 2017-03-11 MED ORDER — NAPROXEN 500 MG PO TABS: 500.0000 mg | ORAL_TABLET | Freq: Two times a day (BID) | ORAL | 0 refills | Status: DC

## 2017-03-11 MED ORDER — DENOSUMAB 60 MG/ML ~~LOC~~ SOLN
60.0000 mg | Freq: Once | SUBCUTANEOUS | Status: AC
  Administered 2017-03-11: 60 mg via SUBCUTANEOUS
  Filled 2017-03-11: qty 1

## 2017-03-11 MED ORDER — GABAPENTIN 300 MG PO CAPS: 300.0000 mg | ORAL_CAPSULE | Freq: Every day | ORAL | 4 refills | Status: DC

## 2017-03-11 NOTE — Patient Instructions (Signed)
Denosumab injection What is this medicine? DENOSUMAB (den oh sue mab) slows bone breakdown. Prolia is used to treat osteoporosis in women after menopause and in men. Xgeva is used to prevent bone fractures and other bone problems caused by cancer bone metastases. Xgeva is also used to treat giant cell tumor of the bone. This medicine may be used for other purposes; ask your health care provider or pharmacist if you have questions. COMMON BRAND NAME(S): Prolia, XGEVA What should I tell my health care provider before I take this medicine? They need to know if you have any of these conditions: -dental disease -eczema -infection or history of infections -kidney disease or on dialysis -low blood calcium or vitamin D -malabsorption syndrome -scheduled to have surgery or tooth extraction -taking medicine that contains denosumab -thyroid or parathyroid disease -an unusual reaction to denosumab, other medicines, foods, dyes, or preservatives -pregnant or trying to get pregnant -breast-feeding How should I use this medicine? This medicine is for injection under the skin. It is given by a health care professional in a hospital or clinic setting. If you are getting Prolia, a special MedGuide will be given to you by the pharmacist with each prescription and refill. Be sure to read this information carefully each time. For Prolia, talk to your pediatrician regarding the use of this medicine in children. Special care may be needed. For Xgeva, talk to your pediatrician regarding the use of this medicine in children. While this drug may be prescribed for children as young as 13 years for selected conditions, precautions do apply. Overdosage: If you think you've taken too much of this medicine contact a poison control center or emergency room at once. Overdosage: If you think you have taken too much of this medicine contact a poison control center or emergency room at once. NOTE: This medicine is only for  you. Do not share this medicine with others. What if I miss a dose? It is important not to miss your dose. Call your doctor or health care professional if you are unable to keep an appointment. What may interact with this medicine? Do not take this medicine with any of the following medications: -other medicines containing denosumab This medicine may also interact with the following medications: -medicines that suppress the immune system -medicines that treat cancer -steroid medicines like prednisone or cortisone This list may not describe all possible interactions. Give your health care provider a list of all the medicines, herbs, non-prescription drugs, or dietary supplements you use. Also tell them if you smoke, drink alcohol, or use illegal drugs. Some items may interact with your medicine. What should I watch for while using this medicine? Visit your doctor or health care professional for regular checks on your progress. Your doctor or health care professional may order blood tests and other tests to see how you are doing. Call your doctor or health care professional if you get a cold or other infection while receiving this medicine. Do not treat yourself. This medicine may decrease your body's ability to fight infection. You should make sure you get enough calcium and vitamin D while you are taking this medicine, unless your doctor tells you not to. Discuss the foods you eat and the vitamins you take with your health care professional. See your dentist regularly. Brush and floss your teeth as directed. Before you have any dental work done, tell your dentist you are receiving this medicine. Do not become pregnant while taking this medicine or for 5 months after stopping   it. Women should inform their doctor if they wish to become pregnant or think they might be pregnant. There is a potential for serious side effects to an unborn child. Talk to your health care professional or pharmacist for more  information. What side effects may I notice from receiving this medicine? Side effects that you should report to your doctor or health care professional as soon as possible: -allergic reactions like skin rash, itching or hives, swelling of the face, lips, or tongue -breathing problems -chest pain -fast, irregular heartbeat -feeling faint or lightheaded, falls -fever, chills, or any other sign of infection -muscle spasms, tightening, or twitches -numbness or tingling -skin blisters or bumps, or is dry, peels, or red -slow healing or unexplained pain in the mouth or jaw -unusual bleeding or bruising Side effects that usually do not require medical attention (Report these to your doctor or health care professional if they continue or are bothersome.): -muscle pain -stomach upset, gas This list may not describe all possible side effects. Call your doctor for medical advice about side effects. You may report side effects to FDA at 1-800-FDA-1088. Where should I keep my medicine? This medicine is only given in a clinic, doctor's office, or other health care setting and will not be stored at home. NOTE: This sheet is a summary. It may not cover all possible information. If you have questions about this medicine, talk to your doctor, pharmacist, or health care provider.  2015, Elsevier/Gold Standard. (2012-03-29 12:37:47)  

## 2017-03-11 NOTE — Progress Notes (Signed)
[place]  [phone]) [phone] Fax:(336) [phone]     ID: [name] OB: [id number]  MR#: [id number]  GDJ#:[id number]  PCP: Helane Rima, MD GYN:   SU: Fanny Skates OTHER MD: [name], Lyndee Leo Sanger, Arnoldo Hooker Thimmappa  CHIEF COMPLAINT:  Right Breast Cancer, triple positive  CURRENT TREATMENT:  anastrozole  BREAST CANCER HISTORY: From the original intake note:  Tarina had routine screening mammography at the breast center 09/21/2013 showing a possible mass in calcifications in the right breast. On 10/11/2013 she underwent right diagnostic mammography and ultrasonography. This confirmed a spiculated 1.2 cm mass in the central right breast, with a group of pleomorphic calcifications slightly laterally. The calcifications spanning 1.6 cm. A second group of calcifications extended inferiorly and spanned 8 mm. The entire area in aggregate measured 6.5 cm. By physical exam there was no palpable abnormality. Ultrasonography showed an irregular hypoechoic mass at the 6:00 position of the right breast measuring 1 cm maximally. The right axilla was normal.  Biopsy of the right breast mass in question as well as the more lateral area of calcifications on 10/11/2013 showed ([id number]) most to be positive for an invasive ductal carcinoma, both estrogen receptor 85-90% positive with strong staining intensity, both progesterone receptor negative, with an MIB-1 of 17%. HER-2 was amplified, with a signals ratio of 3.44 and a copy number per cell 04 0.30.  Bilateral breast MRI 10/19/2013 showed again a spiculated mass in the 6:00 region of the right breast measuring 1.2 cm, a slightly more lateral hematoma associated with a second biopsy and also with clumped enhancement, and asymmetric none masslike enhancement in most of the right breast, the entire area of abnormality measuring 9.0 cm. The left breast was unremarkable and there were no abnormal appearing lymph  nodes.  Her subsequent history is as detailed below   INTERVAL HISTORY: [name] returns today for follow-up of her estrogen receptor positive breast cancer. She continues on anastrozole, with good tolerance. He does have some hot flashes especially since she went off the gabapentin. Vaginal dryness is not a major issue. She does have rheumatoid arthritis and carpal tunnel so that makes arthralgias and myalgias from anastrozole difficult to detect but she does not think that is contributing.  She was supposed to have had a mammogram just before this visit but for some reason that has not been done.  REVIEW OF SYSTEMS: [facility] walking. Her big problem of course is her hands but also her wrists. She tells me she has been found to have carpal tunnel in addition to rheumatoid arthritis. She hasn't had any fever recently. She bruises easily. She's had mild headaches the last 2 days likely secondary to sinusitis. A detailed review of systems today was otherwise stable  PAST MEDICAL HISTORY: Past Medical History:  Diagnosis Date  . Anxiety   . Breast cancer ([place])    "right" (01/10/2014)  . Chronic left shoulder pain    "work related injury" (01/10/2014)  . Degenerative disc disease, cervical   . Fibromyalgia   . Full dentures   . Hypertension    no meds now  . Osteoarthritis of both knees   . Osteoporosis   . PONV (postoperative nausea and vomiting)   . Raynaud disease   . Rheumatoid arthritis ([place])    "elbows; fingers; toes"  . Syncope and collapse    "4 times in the last year" (01/10/2014)  . Type II diabetes  mellitus ([place])    has not taken meds in over a month (01/10/2014)  . Wears glasses     PAST SURGICAL HISTORY: Past Surgical History:  Procedure Laterality Date  . BREAST BIOPSY Right 10/2013  . BREAST IMPLANT EXCHANGE Right 02/12/2016   Procedure: EXCHANGE SILICONE IMPLANT ON RIGHT, CAPSULORRAPHY AND PLACEMENT OF ALLODERM;   Surgeon: Irene Limbo, MD;  Location: [facility];  Service: Plastics;  Laterality: Right;  . BREAST RECONSTRUCTION Right 12/12/2014   Procedure: REVISION OF RIGHT RECONSTRUCTIVE BREAST WITH CAPSULORRHAPHY PLACEMENT OF SILCONE IMPLANTS AND LIPOFILLING TO RIGHT CHEST ;  Surgeon: Irene Limbo, MD;  Location: [facility];  Service: Plastics;  Laterality: Right;  . BREAST RECONSTRUCTION Right 03/06/2015   Procedure: RIGHT NIPPLE AEROLA COMPLEX RECONSTRUCTION AND LOCAL FLAP WITH LIPO FILLING TO RIGHT BREAST,REMOVAL RIGHT CHEST PORT;  Surgeon: Irene Limbo, MD;  Location: [facility];  Service: Plastics;  Laterality: Right;  . BREAST RECONSTRUCTION WITH PLACEMENT OF TISSUE EXPANDER AND FLEX HD (ACELLULAR HYDRATED DERMIS) Right 12/13/2013   Procedure: RIGHT BREAST RECONSTRUCTION WITH PLACEMENT OF TISSUE EXPANDER AND FLEX HD (ACELLULAR HYDRATED DERMIS);  Surgeon: Irene Limbo, MD;  Location: [facility];  Service: Plastics;  Laterality: Right;  . CHOLECYSTECTOMY  1970's  . COLONOSCOPY    . LIPOMA EXCISION Bilateral 12/13/2013   Procedure: EXCISION OF LEFT BREAST KELOID AND RIGHT CHEST KELOID;  Surgeon: Irene Limbo, MD;  Location: [facility];  Service: Plastics;  Laterality: Bilateral;  . LIPOSUCTION WITH LIPOFILLING Right 09/26/2014   Procedure: LIPOSUCTION WITH LIPOFILLING;  Surgeon: Irene Limbo, MD;  Location: [facility];  Service: Plastics;  Laterality: Right;  . LIPOSUCTION WITH LIPOFILLING Right 12/12/2014   Procedure: LIPOSUCTION WITH LIPOFILLING;  Surgeon: Irene Limbo, MD;  Location: [facility];  Service: Plastics;  Laterality: Right;  . LIPOSUCTION WITH LIPOFILLING Right 03/06/2015   Procedure: ABDOMINAL LIPOSUCTION WITH LIPOFILLING TO RIGHT BREAST;  Surgeon: Irene Limbo, MD;  Location: [facility];  Service: Plastics;  Laterality: Right;  . MASTECTOMY    .  MASTECTOMY W/ SENTINEL NODE BIOPSY Right 12/13/2013   Procedure: RIGHT TOTAL MASTECTOMY WITH SENTINEL LYMPH NODE BIOPSY;  Surgeon: Adin Hector, MD;  Location: [facility];  Service: General;  Laterality: Right;  . MASTOPEXY Left 09/26/2014   Procedure: MASTOPEXY;  Surgeon: Irene Limbo, MD;  Location: [facility];  Service: Plastics;  Laterality: Left;  . PORT-A-CATH REMOVAL Right 03/06/2015   Procedure: REMOVAL PORT-A-CATH;  Surgeon: Irene Limbo, MD;  Location: [facility];  Service: Plastics;  Laterality: Right;  . PORTACATH PLACEMENT Right 12/13/2013   Procedure: INSERTION PORT-A-CATH;  Surgeon: Adin Hector, MD;  Location: [facility];  Service: General;  Laterality: Right;  . REMOVAL OF BILATERAL TISSUE EXPANDERS WITH PLACEMENT OF BILATERAL BREAST IMPLANTS Bilateral 09/26/2014   Procedure: REMOVAL OF RIGHT TISSUE EXPANDER WITH PLACEMENT OF RIGHT BREAST IMPLANT LIPOFILLING TO RIGHT BREAST/LEFT BREAST MASTOPEXY FOR SYMMETRY;  Surgeon: Irene Limbo, MD;  Location: [facility];  Service: Plastics;  Laterality: Bilateral;  . TISSUE EXPANDER PLACEMENT Right 01/10/2014   Procedure: Incision and Drainage Right Breast Seroma with TISSUE EXPANDER exchange;  Surgeon: Irene Limbo, MD;  Location: [facility];  Service: Plastics;  Laterality: Right;  Marland Kitchen VAGINAL HYSTERECTOMY  1980   no salpingo-oophoretomu    FAMILY HISTORY Family History  Problem Relation Age of Onset  . Heart disease Mother   . Hypertension  Mother   . Breast cancer Mother   . Diabetes Mother   . Congestive Heart Failure Mother   . Glaucoma Mother    The patient is little information about her father. Her mother died from a heart attack at the age of 30. She had been diagnosed with breast cancer in her late 3s. The patient had 4 brothers and 4 sisters. There is no other history of breast or ovarian cancer in the family to her  knowledge.  GYNECOLOGIC HISTORY:    (Reviewed 04/04/2014) She does not recall her age of menarche. She underwent hysterectomy in her 72s. She did not receive hormone replacement. First live birth at 78. She was GX P2.  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 She is currently disabled secondary to her rheumatoid arthritis. Her husband Naudia Crosley is a retired Administrator. He now works part time at [employer]. Son [name] works in [place] as a Patent attorney. Daughter [name] is a Social worker.    ADVANCED DIRECTIVES: Not in place   HEALTH MAINTENANCE: (Updated 04/04/2014) Social History  Substance Use Topics  . Smoking status: Never Smoker  . Smokeless tobacco: Never Used  . Alcohol use No     Colonoscopy: Not on file  PAP: Status post remote hysterectomy  Bone density: At [facility] hospital 01/05/2009 showed osteopenia with a T score of -1.7  Lipid panel: Not on file   Allergies  Allergen Reactions  . Codeine Other (See Comments) and Hives    Altered mental status, numbness  Altered mental status, Numbess  . Diazepam     Other reaction(s): Delusions (intolerance) hallucinations     Current Outpatient Prescriptions  Medication Sig Dispense Refill  . anastrozole (ARIMIDEX) 1 MG tablet TAKE 76m TABLET BY MOUTH ONCE DAILY 90 tablet 7  . cyclobenzaprine (FLEXERIL) 10 MG tablet Take 1 tablet (10 mg total) by mouth 2 (two) times daily as needed for muscle spasms. 20 tablet 0  . gabapentin (NEURONTIN) 300 MG capsule Take 1 capsule (300 mg total) by mouth at bedtime. 90 capsule 4  . leflunomide (ARAVA) 20 MG tablet Take 1 tablet by mouth daily.    . naproxen (NAPROSYN) 500 MG tablet Take 1 tablet (500 mg total) by mouth 2 (two) times daily. 60 tablet 0   No current facility-administered medications for this visit.     OBJECTIVE:  Middle-aged African-American  woman  Wearing bilateral wrist splints Vitals:   03/11/17 1025  BP: (!) 144/95  Pulse: 92  Resp: 18  Temp: 97.8 F (36.6 C)     Body mass index is 29.92 kg/m.    ECOG FS: 0  Filed Weights   03/11/17 1025  Weight: 153 lb 3.2 oz (69.5 kg)   Sclerae unicteric, EOMs intact Oropharynx clear and moist No cervical or supraclavicular adenopathy Lungs no rales or rhonchi Heart regular rate and rhythm Abd soft, nontender, positive bowel sounds MSK no focal spinal tenderness, no upper extremity lymphedema Neuro: nonfocal, well oriented, appropriate affect Breasts: The right breast is undergone mastectomy. It has also undergone reconstruction, with a silicone implant. There is no evidence of local recurrence. The left breast is status post reduction mammoplasty but otherwise benign. Both axillae are benign.  LAB RESULTS:   Lab Results  Component Value Date   WBC 11.4 (H) 03/11/2017   NEUTROABS 8.7 (H) 03/11/2017   HGB 12.7 03/11/2017  HCT 39.8 03/11/2017   MCV 83.6 03/11/2017   PLT 142 (L) 03/11/2017      Chemistry      Component Value Date/Time   NA 140 09/03/2016 1236   K 4.1 09/03/2016 1236   CL 104 07/31/2016 0630   CO2 21 (L) 09/03/2016 1236   BUN 14.2 09/03/2016 1236   CREATININE 0.9 09/03/2016 1236      Component Value Date/Time   CALCIUM 9.8 09/03/2016 1236   ALKPHOS 152 (H) 09/03/2016 1236   AST 31 09/03/2016 1236   ALT 23 09/03/2016 1236   BILITOT 0.25 09/03/2016 1236     STUDIES: Mammography slightly overdue  ASSESSMENT: 66 y.o. [place] woman   (1)  s/p biopsy of separate Right breast masses 10/11/2013 for a clinical mT1 N0, stage IA invasive ductal carcinoma, grade II-III, one of the mass is being 85% estrogen receptor positive, progesterone receptor negative, with an MIB-1 of 17% and HER-2 amplified.  (2) status post right mastectomy and sentinel lymph node sampling with immediate expander placement 12/13/2013 for a pT1c pN0, stage IA invasive  ductal carcinoma, grade 3, again HER-2 positive.  (3) treated in the adjuvant setting with carboplatin, docetaxel, trastuzumab and pertuzumab, first dose on 02/21/2014   (a). Patient developed significant peripheral neuropathy after one cycle of chemotherapy, and both carboplatin and docetaxel were held beginning with cycle 2. Continued pertuzumab/ trastuzumab x 5 more cycles, completed 08/01/2014  (b)  single agent trastuzumab started 08/29/2014, continued every 3 weeks until May 2016  (d) final echocardiogram 04/25/2015 showed a well preserved ejection fraction at 55%  (4) started on anastrozole on 04/04/2014 .   (a) DEXA scans 12/06/2013  shows osteoporosis, with the lowest T score at -2.49  (b) denosumab/Proluia to started 03/08/2015, repeated every 6 months while on anastrozole     (5) Rheumatoid arthritis,  remicade and lyrica held until completion of trastuzumab. Controlled with oxycodone PRN  (6) chemotherapy-induced peripheral neuropathy  - ongabapentin, 300 BID, and tramadol, 50 mg every 6 hours as needed.    (7)  Implant reconstruction with silicone implant, right breast, with revision 02/12/2016  (a) left breast status post reduction mammoplasty   PLAN: Caroly  is now 3 years out from definitive surgery for her breast cancer with no evidence of disease recurrence. This is very favorable.  She is a little bit behind on her mammography, which has been scheduled now for next week. Accordingly I'm moving her appointment next year to late June she had a will be after and not before her mammography.  We reviewed the reason she is wearing splints for her carpal tunnel. I encouraged her to continue to do so.  I am adding gabapentin at bedtime to help with nighttime hot flashes.  I refilled her cyclobenzaprine and anastrozole as well. She also got a refill for naproxen 500 mg tablets which she may take with food up to twice daily as needed.  Otherwise she will return to see me in  one year. She knows to call for any problems that may develop before that visit.    Chauncey Cruel, MD  03/11/2017 10:27 AMcumulative

## 2017-03-25 ENCOUNTER — Other Ambulatory Visit: Payer: Self-pay | Admitting: Oncology

## 2017-03-25 DIAGNOSIS — Z9011 Acquired absence of right breast and nipple: Secondary | ICD-10-CM

## 2017-04-03 ENCOUNTER — Ambulatory Visit
Admission: RE | Admit: 2017-04-03 | Discharge: 2017-04-03 | Disposition: A | Discharge status: Home / Self Care | Payer: Medicare HMO | Source: Ambulatory Visit | Attending: Oncology | Admitting: Oncology

## 2017-04-03 DIAGNOSIS — Z9011 Acquired absence of right breast and nipple: Secondary | ICD-10-CM

## 2017-04-07 ENCOUNTER — Other Ambulatory Visit: Payer: Self-pay | Admitting: *Deleted

## 2017-04-08 ENCOUNTER — Ambulatory Visit: Payer: Medicare HMO

## 2017-05-21 ENCOUNTER — Telehealth: Payer: Self-pay | Admitting: *Deleted

## 2017-05-21 NOTE — Telephone Encounter (Signed)
"  Can Dr. Jana Hakim send a refill in for my muscle relaxer?  The [facility] [address] says the doctor will have to authorize this."  Message left for patient.  Request how she is feeling and ask to provide name of medication requesting.  Cyclobenzaprine 10 mg, Naproxen 500 mg ordered last on 03-11-2017 with no refills.  Message that provider will review request upon return to office 05-25-2017.

## 2017-05-21 NOTE — Telephone Encounter (Signed)
"  Someone just called me so I'm returning the call.  I need refill for Ce-reen-o- pril or whatever you call it.  I've had them filled before from Dr. Jana Hakim whenever I run out and need them

## 2017-05-27 ENCOUNTER — Telehealth: Payer: Self-pay

## 2017-05-27 MED ORDER — CYCLOBENZAPRINE HCL 10 MG PO TABS: 10.0000 mg | ORAL_TABLET | Freq: Two times a day (BID) | ORAL | 0 refills | Status: DC | PRN

## 2017-05-27 NOTE — Telephone Encounter (Signed)
rx was printed. Tore it up and sent "normal" to [facility].

## 2017-05-27 NOTE — Telephone Encounter (Signed)
Pt called for flexeril refill

## 2017-05-27 NOTE — Addendum Note (Signed)
Addended by: Janace Hoard on: 05/27/2017 12:46 PM   Modules accepted: Orders

## 2017-08-02 ENCOUNTER — Emergency Department (HOSPITAL_COMMUNITY)
Admission: EM | Admit: 2017-08-02 | Discharge: 2017-08-02 | Disposition: A | Discharge status: Home / Self Care | Payer: Medicare HMO | Attending: Emergency Medicine | Admitting: Emergency Medicine

## 2017-08-02 ENCOUNTER — Encounter (HOSPITAL_COMMUNITY): Payer: Self-pay | Admitting: Emergency Medicine

## 2017-08-02 DIAGNOSIS — I10 Essential (primary) hypertension: Secondary | ICD-10-CM | POA: Diagnosis not present

## 2017-08-02 DIAGNOSIS — E119 Type 2 diabetes mellitus without complications: Secondary | ICD-10-CM | POA: Diagnosis not present

## 2017-08-02 DIAGNOSIS — G629 Polyneuropathy, unspecified: Secondary | ICD-10-CM

## 2017-08-02 DIAGNOSIS — Z79899 Other long term (current) drug therapy: Secondary | ICD-10-CM | POA: Insufficient documentation

## 2017-08-02 DIAGNOSIS — Z853 Personal history of malignant neoplasm of breast: Secondary | ICD-10-CM | POA: Diagnosis not present

## 2017-08-02 DIAGNOSIS — G9009 Other idiopathic peripheral autonomic neuropathy: Secondary | ICD-10-CM | POA: Insufficient documentation

## 2017-08-02 DIAGNOSIS — M79671 Pain in right foot: Secondary | ICD-10-CM | POA: Diagnosis present

## 2017-08-02 DIAGNOSIS — M79672 Pain in left foot: Secondary | ICD-10-CM | POA: Insufficient documentation

## 2017-08-02 LAB — BASIC METABOLIC PANEL
Anion gap: 13 (ref 5–15)
BUN: 11 mg/dL (ref 6–20)
CALCIUM: 9.7 mg/dL (ref 8.9–10.3)
CO2: 25 mmol/L (ref 22–32)
CREATININE: 0.92 mg/dL (ref 0.44–1.00)
Chloride: 102 mmol/L (ref 101–111)
GFR calc Af Amer: >60 mL/min (ref >60)
GFR calc non Af Amer: >60 mL/min (ref >60)
GLUCOSE: 92 mg/dL (ref 65–99)
Potassium: 3.3 mmol/L — ABNORMAL LOW (ref 3.5–5.1)
Sodium: 140 mmol/L (ref 135–145)

## 2017-08-02 LAB — CBC WITH DIFFERENTIAL/PLATELET
Basophils Absolute: 0 10*3/uL (ref 0.0–0.1)
Basophils Relative: 0 %
Eosinophils Absolute: 0.1 10*3/uL (ref 0.0–0.7)
Eosinophils Relative: 1 %
HEMATOCRIT: 43.5 % (ref 36.0–46.0)
Hemoglobin: 14.1 g/dL (ref 12.0–15.0)
LYMPHS PCT: 27 %
Lymphs Abs: 2.6 10*3/uL (ref 0.7–4.0)
MCH: 27 pg (ref 26.0–34.0)
MCHC: 32.4 g/dL (ref 30.0–36.0)
MCV: 83.2 fL (ref 78.0–100.0)
MONO ABS: 1.1 10*3/uL — AB (ref 0.1–1.0)
MONOS PCT: 11 %
NEUTROS ABS: 5.8 10*3/uL (ref 1.7–7.7)
Neutrophils Relative %: 61 %
Platelets: 173 10*3/uL (ref 150–400)
RBC: 5.23 MIL/uL — ABNORMAL HIGH (ref 3.87–5.11)
RDW: 14.6 % (ref 11.5–15.5)
WBC: 9.6 10*3/uL (ref 4.0–10.5)

## 2017-08-02 MED ORDER — OXYCODONE-ACETAMINOPHEN 5-325 MG PO TABS: 1.0000 | ORAL_TABLET | Freq: Three times a day (TID) | ORAL | 0 refills | Status: DC | PRN

## 2017-08-02 MED ORDER — OXYCODONE-ACETAMINOPHEN 5-325 MG PO TABS
1.0000 | ORAL_TABLET | Freq: Once | ORAL | Status: AC
  Administered 2017-08-02: 1 via ORAL
  Filled 2017-08-02: qty 1

## 2017-08-02 MED ORDER — OXYCODONE-ACETAMINOPHEN 5-325 MG PO TABS
1.0000 | ORAL_TABLET | ORAL | Status: DC | PRN
  Administered 2017-08-02: 1 via ORAL
  Filled 2017-08-02: qty 1

## 2017-08-02 NOTE — ED Triage Notes (Signed)
Pt reports bilateral foot pain for a few weeks. Denies injury or swelling. Reports redness on the bottom of both feet.

## 2017-08-02 NOTE — Discharge Instructions (Signed)
You can take Tylenol or Ibuprofen as directed for pain. You can alternate Tylenol and Ibuprofen every 4 hours. If you take Tylenol at 1pm, then you can take Ibuprofen at 5pm. Then you can take Tylenol again at 9pm.   You can take the pain medication for severe or breakthrough pain. Do not take it at the same time as tylenol or ibuprofen.   As we discussed, you need to follow up with your rheumatologist for further evaluation and treatment of her pain.  I have also provided outpatient referral to podiatrist they can follow-up with any worsening or concerning symptoms.  Return to the emergency department for any worsening pain, redness or swelling of the feet, fever, difficulty walking or any other worsening or concerning symptoms.

## 2017-08-02 NOTE — ED Provider Notes (Signed)
[facility] DEPT Provider Note   CSN: [id number] Arrival date & time: 08/02/17  8546     History   Chief Complaint Chief Complaint  Patient presents with  . Foot Pain    HPI Angela Gross is a 66 y.o. female personal history of breast cancer (2015), fibromyalgia, rheumatoid arthritis, neuropathy who presents with 1 week of progressively worsening bilateral feet pain and numbness. Patient reports that she has a history of neuropathy in both feet and states that it causes persistent pain. Patient reports that she intermittently will have numbness to the feet. Patient reports over the last week, pain and numbness has significant worsened. Critically the numbness is located to the medial aspects of the bilateral feet. Patient states that ambulating and bearing weight makes the pain worse but she states that she has still been able to ambulate. Patient also reports that the bottom of her feet are red. Patient states that feet are not hot or swollen. Patient has taken Tylenol with no improvement in pain. Patient is ready on gabapentin for previous diagnosis of neuropathy and states that has not been helping the pain. Patient denies any fever, leg pain, chest pain, difficulty breathing, weakness of her legs.  The history is provided by the patient.    Past Medical History:  Diagnosis Date  . Anxiety   . Breast cancer ([place])    "right" (01/10/2014)  . Chronic left shoulder pain    "work related injury" (01/10/2014)  . Degenerative disc disease, cervical   . Fibromyalgia   . Full dentures   . Hypertension    no meds now  . Osteoarthritis of both knees   . Osteoporosis   . PONV (postoperative nausea and vomiting)   . Raynaud disease   . Rheumatoid arthritis ([place])    "elbows; fingers; toes"  . Syncope and collapse    "4 times in the last year" (01/10/2014)  . Type II diabetes mellitus ([facility])    has not taken meds in over a month (01/10/2014)  . Wears  glasses     Patient Active Problem List   Diagnosis Date Noted  . Osteoporosis 02/27/2015  . Constipation 10/04/2014  . Personal history of malignant neoplasm of breast 09/26/2014  . Arthralgia of multiple sites 06/27/2014  . 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 Hot flashes, menopausal 04/04/2014  . 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 Anemia, unspecified 03/14/2014  . Hypokalemia 03/01/2014  . Anxiety 01/30/2014  . Malignant neoplasm of lower-inner quadrant of right breast of female, estrogen receptor positive ([facility]) 10/25/2013  . C-REACTIVE PROTEIN, SERUM, ELEVATED 03/07/2010  . DIABETES MELLITUS, TYPE II 03/04/2010  . ARTHRALGIA 03/04/2010    Past Surgical History:  Procedure Laterality Date  . BREAST BIOPSY Right 10/2013  . BREAST IMPLANT EXCHANGE Right 02/12/2016   Procedure: EXCHANGE SILICONE IMPLANT ON RIGHT, CAPSULORRAPHY AND PLACEMENT OF ALLODERM;  Surgeon: Irene Limbo, MD;  Location: [facility];  Service: Plastics;  Laterality: Right;  . BREAST RECONSTRUCTION Right 12/12/2014   Procedure: REVISION OF RIGHT RECONSTRUCTIVE BREAST WITH CAPSULORRHAPHY PLACEMENT OF SILCONE IMPLANTS AND LIPOFILLING TO RIGHT CHEST ;  Surgeon: Irene Limbo, MD;  Location: [facility];  Service: Plastics;  Laterality: Right;  . BREAST RECONSTRUCTION Right 03/06/2015   Procedure: RIGHT NIPPLE AEROLA COMPLEX RECONSTRUCTION AND LOCAL FLAP WITH LIPO FILLING TO RIGHT BREAST,REMOVAL RIGHT CHEST PORT;  Surgeon: Irene Limbo, MD;  Location: [facility];  Service: Plastics;  Laterality: Right;  .  BREAST RECONSTRUCTION WITH PLACEMENT OF TISSUE EXPANDER AND FLEX HD (ACELLULAR HYDRATED DERMIS) Right 12/13/2013   Procedure: RIGHT BREAST RECONSTRUCTION WITH PLACEMENT OF TISSUE EXPANDER AND FLEX HD (ACELLULAR HYDRATED DERMIS);  Surgeon: Irene Limbo, MD;  Location: [facility];  Service: Plastics;  Laterality: Right;    . CHOLECYSTECTOMY  1970's  . COLONOSCOPY    . LIPOMA EXCISION Bilateral 12/13/2013   Procedure: EXCISION OF LEFT BREAST KELOID AND RIGHT CHEST KELOID;  Surgeon: Irene Limbo, MD;  Location: [facility];  Service: Plastics;  Laterality: Bilateral;  . LIPOSUCTION WITH LIPOFILLING Right 09/26/2014   Procedure: LIPOSUCTION WITH LIPOFILLING;  Surgeon: Irene Limbo, MD;  Location: [facility];  Service: Plastics;  Laterality: Right;  . LIPOSUCTION WITH LIPOFILLING Right 12/12/2014   Procedure: LIPOSUCTION WITH LIPOFILLING;  Surgeon: Irene Limbo, MD;  Location: [facility];  Service: Plastics;  Laterality: Right;  . LIPOSUCTION WITH LIPOFILLING Right 03/06/2015   Procedure: ABDOMINAL LIPOSUCTION WITH LIPOFILLING TO RIGHT BREAST;  Surgeon: Irene Limbo, MD;  Location: [facility];  Service: Plastics;  Laterality: Right;  . MASTECTOMY Right 2015  . MASTECTOMY W/ SENTINEL NODE BIOPSY Right 12/13/2013   Procedure: RIGHT TOTAL MASTECTOMY WITH SENTINEL LYMPH NODE BIOPSY;  Surgeon: Adin Hector, MD;  Location: [facility];  Service: General;  Laterality: Right;  . MASTOPEXY Left 09/26/2014   Procedure: MASTOPEXY;  Surgeon: Irene Limbo, MD;  Location: [facility];  Service: Plastics;  Laterality: Left;  . PORT-A-CATH REMOVAL Right 03/06/2015   Procedure: REMOVAL PORT-A-CATH;  Surgeon: Irene Limbo, MD;  Location: [facility];  Service: Plastics;  Laterality: Right;  . PORTACATH PLACEMENT Right 12/13/2013   Procedure: INSERTION PORT-A-CATH;  Surgeon: Adin Hector, MD;  Location: [facility];  Service: General;  Laterality: Right;  . REDUCTION MAMMAPLASTY Left 2015  . REMOVAL OF BILATERAL TISSUE EXPANDERS WITH PLACEMENT OF BILATERAL BREAST IMPLANTS Bilateral 09/26/2014   Procedure: REMOVAL OF RIGHT TISSUE EXPANDER WITH PLACEMENT OF RIGHT BREAST IMPLANT LIPOFILLING TO RIGHT  BREAST/LEFT BREAST MASTOPEXY FOR SYMMETRY;  Surgeon: Irene Limbo, MD;  Location: [facility];  Service: Plastics;  Laterality: Bilateral;  . TISSUE EXPANDER PLACEMENT Right 01/10/2014   Procedure: Incision and Drainage Right Breast Seroma with TISSUE EXPANDER exchange;  Surgeon: Irene Limbo, MD;  Location: [facility];  Service: Plastics;  Laterality: Right;  Marland Kitchen VAGINAL HYSTERECTOMY  1980   no salpingo-oophoretomu    OB History    No data available       Home Medications    Prior to Admission medications   Medication Sig Start Date End Date Taking? Authorizing Provider  anastrozole (ARIMIDEX) 1 MG tablet TAKE 1mg  TABLET BY MOUTH ONCE DAILY 03/11/17  Yes Magrinat, Virgie Dad, MD  gabapentin (NEURONTIN) 300 MG capsule Take 1 capsule (300 mg total) by mouth at bedtime. 03/11/17  Yes Magrinat, Virgie Dad, MD  leflunomide (ARAVA) 20 MG tablet Take 1 tablet by mouth daily. 07/11/16  Yes [provider]  PRESCRIPTION MEDICATION Inject 1 Dose into the skin once a week. Injectable medication for arthritis, receives in the mail. Says it is Designer, jewellery, Historical, MD  cyclobenzaprine (FLEXERIL) 10 MG tablet Take 1 tablet (10 mg total) by mouth 2 (two) times daily as needed for muscle spasms. Patient not taking: Reported on 08/02/2017 05/27/17   Magrinat, Virgie Dad, MD  naproxen (NAPROSYN) 500 MG tablet Take 1 tablet (500 mg total) by mouth  2 (two) times daily. Patient not taking: Reported on 08/02/2017 03/11/17   Magrinat, Virgie Dad, MD  oxyCODONE-acetaminophen (PERCOCET/ROXICET) 5-325 MG tablet Take 1-2 tablets by mouth every 8 (eight) hours as needed for severe pain. 08/02/17   Volanda Napoleon, PA-C    Family History Family History  Problem Relation Age of Onset  . Heart disease Mother   . Hypertension Mother   . Breast cancer Mother   . Diabetes Mother   . Congestive Heart Failure Mother   . Glaucoma Mother     Social History Social History  Substance Use  Topics  . Smoking status: Never Smoker  . Smokeless tobacco: Never Used  . Alcohol use No     Allergies   Codeine and Diazepam   Review of Systems Review of Systems  Constitutional: Negative for fever.  Respiratory: Negative for shortness of breath.   Cardiovascular: Negative for chest pain.  Gastrointestinal: Negative for abdominal pain.  Musculoskeletal:       Bilateral feet pain  Skin: Positive for color change.  Neurological: Positive for numbness. Negative for headaches.     Physical Exam Updated Vital Signs BP (!) 159/124   Pulse (!) 102   Temp 98.1 F (36.7 C)   Resp 20   Ht 5' (1.524 m)   Wt 67.1 kg (148 lb)   SpO2 97%   BMI 28.90 kg/m   Physical Exam  Constitutional: She appears well-developed and well-nourished.  HENT:  Head: Normocephalic and atraumatic.  Eyes: Conjunctivae and EOM are normal. Right eye exhibits no discharge. Left eye exhibits no discharge. No scleral icterus.  Cardiovascular:  Pulses:      Dorsalis pedis pulses are 2+ on the right side, and 2+ on the left side.  Pulmonary/Chest: Effort normal.  Musculoskeletal:  Tenderness palpation to the medial aspect of bilateral feet that extends over the dorsal aspect. No overlying warmth, erythema, soft tissue swelling. No deformity or crepitus noted. Dorsiflexion and plantarflexion of bilateral ankles intact without any difficulty. Patient able to move all 5 digits of bilateral feet without any difficulty.   Neurological: She is alert. GCS eye subscore is 4. GCS verbal subscore is 5. GCS motor subscore is 6.  Decreased sensation noted to the medial aspect of bilateral feet overlying the L4 nerve distribution. Other sensation distributions of L4 intact.  5/5 strength BLE  Skin: Skin is warm and dry. Capillary refill takes less than 2 seconds.  Good distal cap refill. Bilateral lower extremities are not dusky in appearance or cool to touch  Psychiatric: She has a normal mood and affect. Her speech  is normal and behavior is normal.  Nursing note and vitals reviewed.    ED Treatments / Results  Labs (all labs ordered are listed, but only abnormal results are displayed) Labs Reviewed  CBC WITH DIFFERENTIAL/PLATELET - Abnormal; Notable for the following:       Result Value   RBC 5.23 (*)    Monocytes Absolute 1.1 (*)    All other components within normal limits  BASIC METABOLIC PANEL - Abnormal; Notable for the following:    Potassium 3.3 (*)    All other components within normal limits    EKG  EKG Interpretation None       Radiology No results found.  Procedures Procedures (including critical care time)  Medications Ordered in ED Medications  oxyCODONE-acetaminophen (PERCOCET/ROXICET) 5-325 MG per tablet 1 tablet (1 tablet Oral Given 08/02/17 0943)  oxyCODONE-acetaminophen (PERCOCET/ROXICET) 5-325 MG per tablet 1 tablet (  not administered)     Initial Impression / Assessment and Plan / ED Course  I have reviewed the triage vital signs and the nursing notes.  Pertinent labs & imaging results that were available during my care of the patient were reviewed by me and considered in my medical decision making (see chart for details).     66 y.o. F with past history of fibromyalgia, rheumatoid arthritis, neuropathy who presents with 1 week of progressively worsening bilateral feet pain and numbness. Patient has a history of chronic neuropathy for the last several years. She reports persistent numbness and pain. She states that the numbness is not new. She reports that her pain and symptoms have become worse. Patient still able in delay of worsening pain. She is currently on gabapentin which she takes 3 times a day. He has not been helping for the pain. Patient is afebrile, non-toxic appearing, sitting comfortably on examination table. Vital signs reviewed and stable. Subjective decreased sensation noted to the medial aspect of bilateral feet at the L4 distribution. She has  full sensation other distributions of the L4 nerve dermatome. Good strength of bilateral lower extremities. History/physical exam are not concerning for acute arterial embolism, GBS. Concern that symptoms are related to patient's chronic neuropathy. Will check basic labs to rule out electrolyte imbalance and anemia. Given lack of trauma or fall, no extra imaging indicated at this time. Analgesics provided in the department.  Labs reviewed. CBC unremarkable. BMP shows slight hypokalemia. Discussed results with patient. Encouraged patient to follow-up with a rheumatologist for further evaluation of her pain and treatment options. Vital signs stable. Patient is stable for discharge at this time. Strict return precautions discussed. Patient expresses understanding and agreement to plan.    Final Clinical Impressions(s) / ED Diagnoses   Final diagnoses:  Foot pain, bilateral  Neuropathy    New Prescriptions New Prescriptions   OXYCODONE-ACETAMINOPHEN (PERCOCET/ROXICET) 5-325 MG TABLET    Take 1-2 tablets by mouth every 8 (eight) hours as needed for severe pain.     Volanda Napoleon, PA-C 08/02/17 1520    Lajean Saver, MD 08/05/17 1025

## 2017-09-07 ENCOUNTER — Telehealth: Payer: Self-pay | Admitting: *Deleted

## 2017-09-07 NOTE — Telephone Encounter (Signed)
"  I believe I missed an appointment.  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  Call transferred per patient's request.

## 2017-09-07 NOTE — Telephone Encounter (Signed)
Scheduling message sent as requested by patient for new appointment this month after provider review of future appointments for polio injection series.

## 2017-09-07 NOTE — Telephone Encounter (Signed)
Not sure where this call ended up-- was the patient 's call transferred to scheduling?  I do not see a new appointment  Thank you!

## 2017-09-09 ENCOUNTER — Telehealth: Payer: Self-pay | Admitting: Oncology

## 2017-09-09 NOTE — Telephone Encounter (Signed)
Spoke with patient regarding appt that was added per 11/27 sch msg.

## 2017-09-11 ENCOUNTER — Encounter (HOSPITAL_COMMUNITY): Payer: Self-pay | Admitting: Emergency Medicine

## 2017-09-11 ENCOUNTER — Emergency Department (HOSPITAL_COMMUNITY)
Admission: EM | Admit: 2017-09-11 | Discharge: 2017-09-11 | Disposition: A | Discharge status: Home / Self Care | Payer: Medicare HMO | Attending: Emergency Medicine | Admitting: Emergency Medicine

## 2017-09-11 ENCOUNTER — Other Ambulatory Visit: Payer: Self-pay

## 2017-09-11 ENCOUNTER — Ambulatory Visit: Payer: Medicare HMO

## 2017-09-11 DIAGNOSIS — M797 Fibromyalgia: Secondary | ICD-10-CM | POA: Insufficient documentation

## 2017-09-11 DIAGNOSIS — R200 Anesthesia of skin: Secondary | ICD-10-CM | POA: Diagnosis not present

## 2017-09-11 DIAGNOSIS — I10 Essential (primary) hypertension: Secondary | ICD-10-CM | POA: Diagnosis not present

## 2017-09-11 DIAGNOSIS — M79672 Pain in left foot: Secondary | ICD-10-CM | POA: Insufficient documentation

## 2017-09-11 DIAGNOSIS — Z853 Personal history of malignant neoplasm of breast: Secondary | ICD-10-CM | POA: Insufficient documentation

## 2017-09-11 DIAGNOSIS — E1140 Type 2 diabetes mellitus with diabetic neuropathy, unspecified: Secondary | ICD-10-CM | POA: Diagnosis not present

## 2017-09-11 DIAGNOSIS — G8929 Other chronic pain: Secondary | ICD-10-CM | POA: Diagnosis not present

## 2017-09-11 DIAGNOSIS — M79671 Pain in right foot: Secondary | ICD-10-CM

## 2017-09-11 DIAGNOSIS — Z79899 Other long term (current) drug therapy: Secondary | ICD-10-CM | POA: Insufficient documentation

## 2017-09-11 DIAGNOSIS — M069 Rheumatoid arthritis, unspecified: Secondary | ICD-10-CM | POA: Diagnosis not present

## 2017-09-11 MED ORDER — OXYCODONE-ACETAMINOPHEN 5-325 MG PO TABS
1.0000 | ORAL_TABLET | Freq: Once | ORAL | Status: AC
  Administered 2017-09-11: 1 via ORAL
  Filled 2017-09-11: qty 1

## 2017-09-11 MED ORDER — OXYCODONE-ACETAMINOPHEN 5-325 MG PO TABS: 1.0000 | ORAL_TABLET | Freq: Four times a day (QID) | ORAL | 0 refills | Status: DC | PRN

## 2017-09-11 NOTE — ED Triage Notes (Signed)
Pt c/o stabbing pain in both feet-- states it doesn't feel like the neuropathy that she has had in past.

## 2017-09-11 NOTE — Discharge Instructions (Signed)
Please schedule an appointment with your rheumatologist for follow-up of your ongoing foot pain and numbness.  Continue to take your gabapentin. I have also written you a prescription for pain medication.  This medicine can make you drowsy, please do not drive, work or drink alcohol while taking this medicine.  Please schedule an appointment with your primary doctor for recheck of your blood pressure which was elevated in the ER today.  Return to the emergency department if you have calf pain or swelling, weakness in which you can no longer walk, chest pain, shortness of breath or any new or worsening symptoms.

## 2017-09-11 NOTE — ED Provider Notes (Signed)
[facility] EMERGENCY DEPARTMENT Provider Note   CSN: [id number] Arrival date & time: 09/11/17  1425     History   Chief Complaint Chief Complaint  Patient presents with  . Foot Pain    HPI Angela Gross is a 66 y.o. female.  HPI   Angela Gross is a 66yo female with a history of fibromyalgia, rheumatoid arthritis, breast cancer (2015), peripheral neuropathy, who presents with 3 weeks of progressively worsening bilateral foot pain and numbness.  Patient reports that she has a history of peripheral neuropathy in bilateral feet following chemotherapy treatment for her breast cancer.  She has been seen by a rheumatologist  for this in the past.  States that over the past 3 weeks she has sharp, shooting sensation in bilateral toes which radiates to the mid calf.  She also endorses numbness in her toes, whereas previously she had numbness at the bottom of her feet.  Ambulating and bearing weight worsens her pain.  She is able to ambulate independently, although painful.  She takes gabapentin three times a day for neuropathy, but states that it is not helping her. She denies fever, weakness, redness, calf/leg pain, chest pain, shortness of breath.  Per chart review, patient was seen in the emergency department for similar complaint about a month ago.  Basic labs were negative.  She was counseled to follow-up with her rheumatologist, which she states she has not done yet.   Past Medical History:  Diagnosis Date  . Anxiety   . Breast cancer ([place])    "right" (01/10/2014)  . Chronic left shoulder pain    "work related injury" (01/10/2014)  . Degenerative disc disease, cervical   . Fibromyalgia   . Full dentures   . Hypertension    no meds now  . Osteoarthritis of both knees   . Osteoporosis   . PONV (postoperative nausea and vomiting)   . Raynaud disease   . Rheumatoid arthritis ([place])    "elbows; fingers; toes"  . Syncope and collapse    "4 times in the last year"  (01/10/2014)  . Type II diabetes mellitus ([facility])    has not taken meds in over a month (01/10/2014)  . Wears glasses     Patient Active Problem List   Diagnosis Date Noted  . Osteoporosis 02/27/2015  . Constipation 10/04/2014  . Personal history of malignant neoplasm of breast 09/26/2014  . Arthralgia of multiple sites 06/27/2014  . 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 Hot flashes, menopausal 04/04/2014  . Chemotherapy-induced neuropathy ([place]) 03/14/2014  . Anemia, unspecified 03/14/2014  . Hypokalemia 03/01/2014  . Anxiety 01/30/2014  . Malignant neoplasm of lower-inner quadrant of right breast of female, estrogen receptor positive ([place]) 10/25/2013  . C-REACTIVE PROTEIN, SERUM, ELEVATED 03/07/2010  . DIABETES MELLITUS, TYPE II 03/04/2010  . ARTHRALGIA 03/04/2010    Past Surgical History:  Procedure Laterality Date  . BREAST BIOPSY Right 10/2013  . BREAST IMPLANT EXCHANGE Right 02/12/2016   Procedure: EXCHANGE SILICONE IMPLANT ON RIGHT, CAPSULORRAPHY AND PLACEMENT OF ALLODERM;  Surgeon: Irene Limbo, MD;  Location: [facility];  Service: Plastics;  Laterality: Right;  . BREAST RECONSTRUCTION Right 12/12/2014   Procedure: REVISION OF RIGHT RECONSTRUCTIVE BREAST WITH CAPSULORRHAPHY PLACEMENT OF SILCONE IMPLANTS AND LIPOFILLING TO RIGHT CHEST ;  Surgeon: Irene Limbo, MD;  Location: [facility];  Service: Plastics;  Laterality: Right;  . BREAST RECONSTRUCTION Right 03/06/2015   Procedure: RIGHT NIPPLE AEROLA COMPLEX RECONSTRUCTION AND LOCAL FLAP  WITH LIPO FILLING TO RIGHT BREAST,REMOVAL RIGHT CHEST PORT;  Surgeon: Irene Limbo, MD;  Location: [facility];  Service: Plastics;  Laterality: Right;  . BREAST RECONSTRUCTION WITH PLACEMENT OF TISSUE EXPANDER AND FLEX HD (ACELLULAR HYDRATED DERMIS) Right 12/13/2013   Procedure: RIGHT BREAST RECONSTRUCTION WITH PLACEMENT OF TISSUE EXPANDER AND FLEX HD (ACELLULAR HYDRATED DERMIS);   Surgeon: Irene Limbo, MD;  Location: [facility];  Service: Plastics;  Laterality: Right;  . CHOLECYSTECTOMY  1970's  . COLONOSCOPY    . LIPOMA EXCISION Bilateral 12/13/2013   Procedure: EXCISION OF LEFT BREAST KELOID AND RIGHT CHEST KELOID;  Surgeon: Irene Limbo, MD;  Location: [facility];  Service: Plastics;  Laterality: Bilateral;  . LIPOSUCTION WITH LIPOFILLING Right 09/26/2014   Procedure: LIPOSUCTION WITH LIPOFILLING;  Surgeon: Irene Limbo, MD;  Location: [facility];  Service: Plastics;  Laterality: Right;  . LIPOSUCTION WITH LIPOFILLING Right 12/12/2014   Procedure: LIPOSUCTION WITH LIPOFILLING;  Surgeon: Irene Limbo, MD;  Location: [facility];  Service: Plastics;  Laterality: Right;  . LIPOSUCTION WITH LIPOFILLING Right 03/06/2015   Procedure: ABDOMINAL LIPOSUCTION WITH LIPOFILLING TO RIGHT BREAST;  Surgeon: Irene Limbo, MD;  Location: [facility];  Service: Plastics;  Laterality: Right;  . MASTECTOMY Right 2015  . MASTECTOMY W/ SENTINEL NODE BIOPSY Right 12/13/2013   Procedure: RIGHT TOTAL MASTECTOMY WITH SENTINEL LYMPH NODE BIOPSY;  Surgeon: Adin Hector, MD;  Location: [facility];  Service: General;  Laterality: Right;  . MASTOPEXY Left 09/26/2014   Procedure: MASTOPEXY;  Surgeon: Irene Limbo, MD;  Location: [facility];  Service: Plastics;  Laterality: Left;  . PORT-A-CATH REMOVAL Right 03/06/2015   Procedure: REMOVAL PORT-A-CATH;  Surgeon: Irene Limbo, MD;  Location: [facility];  Service: Plastics;  Laterality: Right;  . PORTACATH PLACEMENT Right 12/13/2013   Procedure: INSERTION PORT-A-CATH;  Surgeon: Adin Hector, MD;  Location: [facility];  Service: General;  Laterality: Right;  . REDUCTION MAMMAPLASTY Left 2015  . REMOVAL OF BILATERAL TISSUE EXPANDERS WITH PLACEMENT OF BILATERAL BREAST IMPLANTS Bilateral 09/26/2014     Procedure: REMOVAL OF RIGHT TISSUE EXPANDER WITH PLACEMENT OF RIGHT BREAST IMPLANT LIPOFILLING TO RIGHT BREAST/LEFT BREAST MASTOPEXY FOR SYMMETRY;  Surgeon: Irene Limbo, MD;  Location: [facility];  Service: Plastics;  Laterality: Bilateral;  . TISSUE EXPANDER PLACEMENT Right 01/10/2014   Procedure: Incision and Drainage Right Breast Seroma with TISSUE EXPANDER exchange;  Surgeon: Irene Limbo, MD;  Location: [facility];  Service: Plastics;  Laterality: Right;  Marland Kitchen VAGINAL HYSTERECTOMY  1980   no salpingo-oophoretomu    OB History    No data available       Home Medications    Prior to Admission medications   Medication Sig Start Date End Date Taking? Authorizing Provider  anastrozole (ARIMIDEX) 1 MG tablet TAKE 1mg  TABLET BY MOUTH ONCE DAILY 03/11/17   Magrinat, Virgie Dad, MD  cyclobenzaprine (FLEXERIL) 10 MG tablet Take 1 tablet (10 mg total) by mouth 2 (two) times daily as needed for muscle spasms. Patient not taking: Reported on 08/02/2017 05/27/17   Magrinat, Virgie Dad, MD  gabapentin (NEURONTIN) 300 MG capsule Take 1 capsule (300 mg total) by mouth at bedtime. 03/11/17   Magrinat, Virgie Dad, MD  leflunomide (ARAVA) 20 MG tablet Take 1 tablet by mouth daily. 07/11/16   [provider]  naproxen (NAPROSYN) 500 MG tablet Take 1 tablet (500 mg total) by mouth 2 (two)  times daily. Patient not taking: Reported on 08/02/2017 03/11/17   Magrinat, Virgie Dad, MD  oxyCODONE-acetaminophen (PERCOCET/ROXICET) 5-325 MG tablet Take 1-2 tablets by mouth every 8 (eight) hours as needed for severe pain. 08/02/17   Volanda Napoleon, PA-C  PRESCRIPTION MEDICATION Inject 1 Dose into the skin once a week. Injectable medication for arthritis, receives in the mail. Says it is Engineer, agricultural, Historical, MD    Family History Family History  Problem Relation Age of Onset  . Heart disease Mother   . Hypertension Mother   . Breast cancer Mother   . Diabetes Mother   . Congestive  Heart Failure Mother   . Glaucoma Mother     Social History Social History   Tobacco Use  . Smoking status: Never Smoker  . Smokeless tobacco: Never Used  Substance Use Topics  . Alcohol use: No  . Drug use: No     Allergies   Codeine and Diazepam   Review of Systems Review of Systems  Constitutional: Negative for chills and fever.  Respiratory: Negative for chest tightness and shortness of breath.   Cardiovascular: Negative for chest pain, palpitations and leg swelling.  Gastrointestinal: Negative for abdominal pain, diarrhea and vomiting.  Musculoskeletal: Positive for arthralgias (bilateral foot pain). Negative for gait problem and joint swelling.  Skin: Negative for color change, rash and wound.  Neurological: Positive for numbness (bilateral feet, chronic). Negative for weakness.     Physical Exam Updated Vital Signs BP (!) 164/104 (BP Location: Right Arm)   Pulse (!) 116   Temp 98.4 F (36.9 C) (Oral)   Resp 18   SpO2 98%   Physical Exam  Constitutional: She appears well-developed and well-nourished. No distress.  HENT:  Head: Normocephalic and atraumatic.  Eyes: Right eye exhibits no discharge. Left eye exhibits no discharge.  Cardiovascular: Regular rhythm and intact distal pulses. Exam reveals no friction rub.  No murmur heard. Tachycardic  Pulmonary/Chest: Effort normal and breath sounds normal. No stridor. No respiratory distress. She has no wheezes. She has no rales. She exhibits no tenderness.  Musculoskeletal:  No erythema, edema or induration of the toes, foot or bilateral legs.  No calf tenderness.  Mild tenderness to palpation over all 5 digits of bilateral feet. Full active dorsiflexion and plantarflexion of bilateral ankles. Patient able to move all 5 digits of bilateral feet without difficulty. Strength 5/5 in bilateral ankles and knee joints.  DP pulses 2+ bilaterally.  Toes warm.  Capillary refill <2sec.    Neurological: She is alert.  Coordination normal.  Patellar reflex 2+ bilaterally.  Sensation to light/sharp touch absent in all 5 digits of bilateral LE. Sensation to light/sharp touch absent on entire plantar portion of the feet bilaterally. Sensation to light/sharp touch intact on the dorsum of the foot bilaterally. Gait normal in coordination and balance, although painful.  Skin: Skin is warm and dry. Capillary refill takes less than 2 seconds. She is not diaphoretic. No erythema. No pallor.  Psychiatric: She has a normal mood and affect. Her behavior is normal.  Nursing note and vitals reviewed.     ED Treatments / Results  Labs (all labs ordered are listed, but only abnormal results are displayed) Labs Reviewed - No data to display  EKG  EKG Interpretation None       Radiology No results found.  Procedures Procedures (including critical care time)  Medications Ordered in ED Medications - No data to display   Initial Impression / Assessment  and Plan / ED Course  I have reviewed the triage vital signs and the nursing notes.  Pertinent labs & imaging results that were available during my care of the patient were reviewed by me and considered in my medical decision making (see chart for details).    Patient presents with several weeks of bilateral foot pain and numbness.  She has a history of peripheral neuropathy, chemotherapy-induced.  No calf tenderness, erythema or evidence of DVT on exam.  Do not suspect arterial occlusion as peripheral pulses 2+, toes warm and capillary refill  <2sec. She is afebrile, nontoxic appearing and sitting comfortably on exam table.  Presentation and exam not concerning for Guyon Barr.  Did not order an x-ray, given presentation and no history of recent injury.  Her symptoms are likely ongoing from her chronic peripheral neuropathy.  Have counseled her to continue taking gabapentin and follow-up with her rheumatologist.   Patient's blood pressure was elevated while she was  in the emergency department today, she was also tachycardic (pulse 110-116bpm.)  She continues to deny chest pain, shortness of breath or any other symptoms besides foot pain and numbness.  States that her pulse is often elevated when she is in pain.  Do not suspect PE as no evidence of DVT on exam, patient denies chest pain, pulse ox 98% on RA. Discussed patient's tachycardia with Dr. Vallery Ridge who agrees with plan to discharge given patient denies chest pain, shortness of breath or any associated symptoms and is in no acute distress.  Have given patient return precautions and she agrees and voiced understanding to plan.  Patient has no complaints prior to discharge.  Final Clinical Impressions(s) / ED Diagnoses   Final diagnoses:  Bilateral foot pain    ED Discharge Orders    None       Glyn Ade, PA-C 09/11/17 1747    Charlesetta Shanks, MD 09/13/17 [phone]

## 2017-09-11 NOTE — ED Notes (Signed)
[facility] aware of vitals

## 2017-09-11 NOTE — ED Notes (Signed)
[facility] aware of bp and hr

## 2017-09-14 ENCOUNTER — Telehealth: Payer: Self-pay | Admitting: Oncology

## 2017-09-14 NOTE — Telephone Encounter (Signed)
Patient came in to reschedule appointment that was missed

## 2017-09-15 ENCOUNTER — Other Ambulatory Visit: Payer: Self-pay

## 2017-09-15 DIAGNOSIS — Z170 Estrogen receptor positive status [ER+]: Principal | ICD-10-CM

## 2017-09-15 DIAGNOSIS — C50311 Malignant neoplasm of lower-inner quadrant of right female breast: Secondary | ICD-10-CM

## 2017-09-16 ENCOUNTER — Other Ambulatory Visit (HOSPITAL_BASED_OUTPATIENT_CLINIC_OR_DEPARTMENT_OTHER): Payer: Medicare HMO

## 2017-09-16 ENCOUNTER — Ambulatory Visit (HOSPITAL_BASED_OUTPATIENT_CLINIC_OR_DEPARTMENT_OTHER): Payer: Medicare HMO

## 2017-09-16 VITALS — BP 159/90 | HR 110 | Temp 98.1°F | Resp 20

## 2017-09-16 DIAGNOSIS — M810 Age-related osteoporosis without current pathological fracture: Secondary | ICD-10-CM | POA: Diagnosis not present

## 2017-09-16 DIAGNOSIS — Z170 Estrogen receptor positive status [ER+]: Principal | ICD-10-CM

## 2017-09-16 DIAGNOSIS — C50811 Malignant neoplasm of overlapping sites of right female breast: Secondary | ICD-10-CM

## 2017-09-16 DIAGNOSIS — C50311 Malignant neoplasm of lower-inner quadrant of right female breast: Secondary | ICD-10-CM

## 2017-09-16 LAB — CBC WITH DIFFERENTIAL/PLATELET
BASO%: 0.2 % (ref 0.0–2.0)
Basophils Absolute: 0 10*3/uL (ref 0.0–0.1)
EOS%: 0.7 % (ref 0.0–7.0)
Eosinophils Absolute: 0.1 10*3/uL (ref 0.0–0.5)
HCT: 40.9 % (ref 34.8–46.6)
HEMOGLOBIN: 13.3 g/dL (ref 11.6–15.9)
LYMPH#: 2.1 10*3/uL (ref 0.9–3.3)
LYMPH%: 18.8 % (ref 14.0–49.7)
MCH: 27.3 pg (ref 25.1–34.0)
MCHC: 32.5 g/dL (ref 31.5–36.0)
MCV: 84 fL (ref 79.5–101.0)
MONO#: 1.2 10*3/uL — AB (ref 0.1–0.9)
MONO%: 10.6 % (ref 0.0–14.0)
NEUT%: 69.7 % (ref 38.4–76.8)
NEUTROS ABS: 7.9 10*3/uL — AB (ref 1.5–6.5)
Platelets: 195 10*3/uL (ref 145–400)
RBC: 4.87 10*6/uL (ref 3.70–5.45)
RDW: 14.3 % (ref 11.2–14.5)
WBC: 11.3 10*3/uL — AB (ref 3.9–10.3)
nRBC: 0 % (ref 0–0)

## 2017-09-16 LAB — COMPREHENSIVE METABOLIC PANEL
ALBUMIN: 3.9 g/dL (ref 3.5–5.0)
ALK PHOS: 106 U/L (ref 40–150)
ALT: 16 U/L (ref 0–55)
AST: 26 U/L (ref 5–34)
Anion Gap: 15 mEq/L — ABNORMAL HIGH (ref 3–11)
BUN: 13.1 mg/dL (ref 7.0–26.0)
CALCIUM: 10.1 mg/dL (ref 8.4–10.4)
CO2: 20 mEq/L — ABNORMAL LOW (ref 22–29)
Chloride: 105 mEq/L (ref 98–109)
Creatinine: 1 mg/dL (ref 0.6–1.1)
Glucose: 117 mg/dl (ref 70–140)
POTASSIUM: 3.9 meq/L (ref 3.5–5.1)
Sodium: 139 mEq/L (ref 136–145)
Total Bilirubin: 0.34 mg/dL (ref 0.20–1.20)
Total Protein: 7.9 g/dL (ref 6.4–8.3)

## 2017-09-16 MED ORDER — DENOSUMAB 60 MG/ML ~~LOC~~ SOLN
60.0000 mg | Freq: Once | SUBCUTANEOUS | Status: AC
  Administered 2017-09-16: 60 mg via SUBCUTANEOUS
  Filled 2017-09-16: qty 1

## 2017-09-16 NOTE — Patient Instructions (Signed)
Denosumab injection What is this medicine? DENOSUMAB (den oh sue mab) slows bone breakdown. Prolia is used to treat osteoporosis in women after menopause and in men. Xgeva is used to prevent bone fractures and other bone problems caused by cancer bone metastases. Xgeva is also used to treat giant cell tumor of the bone. This medicine may be used for other purposes; ask your health care provider or pharmacist if you have questions. COMMON BRAND NAME(S): Prolia, XGEVA What should I tell my health care provider before I take this medicine? They need to know if you have any of these conditions: -dental disease -eczema -infection or history of infections -kidney disease or on dialysis -low blood calcium or vitamin D -malabsorption syndrome -scheduled to have surgery or tooth extraction -taking medicine that contains denosumab -thyroid or parathyroid disease -an unusual reaction to denosumab, other medicines, foods, dyes, or preservatives -pregnant or trying to get pregnant -breast-feeding How should I use this medicine? This medicine is for injection under the skin. It is given by a health care professional in a hospital or clinic setting. If you are getting Prolia, a special MedGuide will be given to you by the pharmacist with each prescription and refill. Be sure to read this information carefully each time. For Prolia, talk to your pediatrician regarding the use of this medicine in children. Special care may be needed. For Xgeva, talk to your pediatrician regarding the use of this medicine in children. While this drug may be prescribed for children as young as 13 years for selected conditions, precautions do apply. Overdosage: If you think you've taken too much of this medicine contact a poison control center or emergency room at once. Overdosage: If you think you have taken too much of this medicine contact a poison control center or emergency room at once. NOTE: This medicine is only for  you. Do not share this medicine with others. What if I miss a dose? It is important not to miss your dose. Call your doctor or health care professional if you are unable to keep an appointment. What may interact with this medicine? Do not take this medicine with any of the following medications: -other medicines containing denosumab This medicine may also interact with the following medications: -medicines that suppress the immune system -medicines that treat cancer -steroid medicines like prednisone or cortisone This list may not describe all possible interactions. Give your health care provider a list of all the medicines, herbs, non-prescription drugs, or dietary supplements you use. Also tell them if you smoke, drink alcohol, or use illegal drugs. Some items may interact with your medicine. What should I watch for while using this medicine? Visit your doctor or health care professional for regular checks on your progress. Your doctor or health care professional may order blood tests and other tests to see how you are doing. Call your doctor or health care professional if you get a cold or other infection while receiving this medicine. Do not treat yourself. This medicine may decrease your body's ability to fight infection. You should make sure you get enough calcium and vitamin D while you are taking this medicine, unless your doctor tells you not to. Discuss the foods you eat and the vitamins you take with your health care professional. See your dentist regularly. Brush and floss your teeth as directed. Before you have any dental work done, tell your dentist you are receiving this medicine. Do not become pregnant while taking this medicine or for 5 months after stopping   it. Women should inform their doctor if they wish to become pregnant or think they might be pregnant. There is a potential for serious side effects to an unborn child. Talk to your health care professional or pharmacist for more  information. What side effects may I notice from receiving this medicine? Side effects that you should report to your doctor or health care professional as soon as possible: -allergic reactions like skin rash, itching or hives, swelling of the face, lips, or tongue -breathing problems -chest pain -fast, irregular heartbeat -feeling faint or lightheaded, falls -fever, chills, or any other sign of infection -muscle spasms, tightening, or twitches -numbness or tingling -skin blisters or bumps, or is dry, peels, or red -slow healing or unexplained pain in the mouth or jaw -unusual bleeding or bruising Side effects that usually do not require medical attention (Report these to your doctor or health care professional if they continue or are bothersome.): -muscle pain -stomach upset, gas This list may not describe all possible side effects. Call your doctor for medical advice about side effects. You may report side effects to FDA at 1-800-FDA-1088. Where should I keep my medicine? This medicine is only given in a clinic, doctor's office, or other health care setting and will not be stored at home. NOTE: This sheet is a summary. It may not cover all possible information. If you have questions about this medicine, talk to your doctor, pharmacist, or health care provider.  2015, Elsevier/Gold Standard. (2012-03-29 12:37:47)  

## 2018-02-23 ENCOUNTER — Other Ambulatory Visit: Payer: Self-pay | Admitting: Oncology

## 2018-02-23 ENCOUNTER — Telehealth: Payer: Self-pay | Admitting: Oncology

## 2018-02-23 DIAGNOSIS — Z1231 Encounter for screening mammogram for malignant neoplasm of breast: Secondary | ICD-10-CM

## 2018-02-23 NOTE — Telephone Encounter (Signed)
Returned call to patient regarding when next prolia injection will be. Checked with desk nurse and next injection will be given 6/26 at next f/u. Added injection and adjusted lab time due to f/u moved from [facility] to [facility] per 5/13 schedule message. Spoke with patient she is aware.

## 2018-04-05 ENCOUNTER — Ambulatory Visit: Payer: Medicare HMO

## 2018-04-07 ENCOUNTER — Inpatient Hospital Stay: Payer: Medicare HMO | Attending: Oncology | Admitting: Adult Health

## 2018-04-07 ENCOUNTER — Inpatient Hospital Stay: Payer: Medicare HMO

## 2018-04-07 ENCOUNTER — Encounter: Payer: Self-pay | Admitting: Adult Health

## 2018-04-07 ENCOUNTER — Telehealth: Payer: Self-pay

## 2018-04-07 ENCOUNTER — Other Ambulatory Visit: Payer: Medicare HMO

## 2018-04-07 VITALS — BP 159/98 | HR 90 | Temp 98.2°F | Resp 19 | Ht 60.0 in | Wt 143.4 lb

## 2018-04-07 DIAGNOSIS — C50811 Malignant neoplasm of overlapping sites of right female breast: Secondary | ICD-10-CM | POA: Insufficient documentation

## 2018-04-07 DIAGNOSIS — C50311 Malignant neoplasm of lower-inner quadrant of right female breast: Secondary | ICD-10-CM

## 2018-04-07 DIAGNOSIS — Z170 Estrogen receptor positive status [ER+]: Principal | ICD-10-CM

## 2018-04-07 DIAGNOSIS — T451X5A Adverse effect of antineoplastic and immunosuppressive drugs, initial encounter: Secondary | ICD-10-CM | POA: Diagnosis not present

## 2018-04-07 DIAGNOSIS — Z79811 Long term (current) use of aromatase inhibitors: Secondary | ICD-10-CM | POA: Diagnosis not present

## 2018-04-07 DIAGNOSIS — M810 Age-related osteoporosis without current pathological fracture: Secondary | ICD-10-CM | POA: Diagnosis not present

## 2018-04-07 DIAGNOSIS — M069 Rheumatoid arthritis, unspecified: Secondary | ICD-10-CM | POA: Diagnosis not present

## 2018-04-07 DIAGNOSIS — Z9011 Acquired absence of right breast and nipple: Secondary | ICD-10-CM | POA: Diagnosis not present

## 2018-04-07 DIAGNOSIS — G620 Drug-induced polyneuropathy: Secondary | ICD-10-CM

## 2018-04-07 DIAGNOSIS — M818 Other osteoporosis without current pathological fracture: Secondary | ICD-10-CM

## 2018-04-07 LAB — CBC WITH DIFFERENTIAL/PLATELET
Basophils Absolute: 0 10*3/uL (ref 0.0–0.1)
Basophils Relative: 1 %
Eosinophils Absolute: 0.2 10*3/uL (ref 0.0–0.5)
Eosinophils Relative: 2 %
HEMATOCRIT: 35.2 % (ref 34.8–46.6)
Hemoglobin: 11.4 g/dL — ABNORMAL LOW (ref 11.6–15.9)
LYMPHS PCT: 22 %
Lymphs Abs: 1.6 10*3/uL (ref 0.9–3.3)
MCH: 27.4 pg (ref 25.1–34.0)
MCHC: 32.3 g/dL (ref 31.5–36.0)
MCV: 84.8 fL (ref 79.5–101.0)
MONO ABS: 0.7 10*3/uL (ref 0.1–0.9)
Monocytes Relative: 10 %
NEUTROS ABS: 4.8 10*3/uL (ref 1.5–6.5)
Neutrophils Relative %: 65 %
Platelets: 137 10*3/uL — ABNORMAL LOW (ref 145–400)
RBC: 4.15 MIL/uL (ref 3.70–5.45)
RDW: 14.5 % (ref 11.2–14.5)
WBC: 7.3 10*3/uL (ref 3.9–10.3)

## 2018-04-07 LAB — COMPREHENSIVE METABOLIC PANEL
ALBUMIN: 3.6 g/dL (ref 3.5–5.0)
ALT: 8 U/L (ref 0–44)
ANION GAP: 7 (ref 5–15)
AST: 13 U/L — ABNORMAL LOW (ref 15–41)
Alkaline Phosphatase: 79 U/L (ref 38–126)
BUN: 12 mg/dL (ref 8–23)
CHLORIDE: 110 mmol/L (ref 98–111)
CO2: 25 mmol/L (ref 22–32)
Calcium: 9.2 mg/dL (ref 8.9–10.3)
Creatinine, Ser: 1.12 mg/dL — ABNORMAL HIGH (ref 0.44–1.00)
GFR calc Af Amer: 59 mL/min — ABNORMAL LOW (ref >60)
GFR calc non Af Amer: 51 mL/min — ABNORMAL LOW (ref >60)
GLUCOSE: 87 mg/dL (ref 70–99)
POTASSIUM: 4 mmol/L (ref 3.5–5.1)
SODIUM: 142 mmol/L (ref 135–145)
Total Bilirubin: 0.2 mg/dL — ABNORMAL LOW (ref 0.3–1.2)
Total Protein: 6.4 g/dL — ABNORMAL LOW (ref 6.5–8.1)

## 2018-04-07 MED ORDER — DENOSUMAB 60 MG/ML ~~LOC~~ SOLN
60.0000 mg | Freq: Once | SUBCUTANEOUS | Status: AC
  Administered 2018-04-07: 60 mg via SUBCUTANEOUS

## 2018-04-07 NOTE — Patient Instructions (Signed)

## 2018-04-07 NOTE — Patient Instructions (Signed)
Bone Health Bones protect organs, store calcium, and anchor muscles. Good health habits, such as eating nutritious foods and exercising regularly, are important for maintaining healthy bones. They can also help to prevent a condition that causes bones to lose density and become weak and brittle (osteoporosis). Why is bone mass important? Bone mass refers to the amount of bone tissue that you have. The higher your bone mass, the stronger your bones. An important step toward having healthy bones throughout life is to have strong and dense bones during childhood. A young adult who has a high bone mass is more likely to have a high bone mass later in life. Bone mass at its greatest it is called peak bone mass. A large decline in bone mass occurs in older adults. In women, it occurs about the time of menopause. During this time, it is important to practice good health habits, because if more bone is lost than what is replaced, the bones will become less healthy and more likely to break (fracture). If you find that you have a low bone mass, you may be able to prevent osteoporosis or further bone loss by changing your diet and lifestyle. How can I find out if my bone mass is low? Bone mass can be measured with an X-ray test that is called a bone mineral density (BMD) test. This test is recommended for all women who are age 65 or older. It may also be recommended for men who are age 70 or older, or for people who are more likely to develop osteoporosis due to:  Having bones that break easily.  Having a long-term disease that weakens bones, such as kidney disease or rheumatoid arthritis.  Having menopause earlier than normal.  Taking medicine that weakens bones, such as steroids, thyroid hormones, or hormone treatment for breast cancer or prostate cancer.  Smoking.  Drinking three or more alcoholic drinks each day.  What are the nutritional recommendations for healthy bones? To have healthy bones, you  need to get enough of the right minerals and vitamins. Most nutrition experts recommend getting these nutrients from the foods that you eat. Nutritional recommendations vary from person to person. Ask your health care provider what is healthy for you. Here are some general guidelines. Calcium Recommendations Calcium is the most important (essential) mineral for bone health. Most people can get enough calcium from their diet, but supplements may be recommended for people who are at risk for osteoporosis. Good sources of calcium include:  Dairy products, such as low-fat or nonfat milk, cheese, and yogurt.  Dark green leafy vegetables, such as bok choy and broccoli.  Calcium-fortified foods, such as orange juice, cereal, bread, soy beverages, and tofu products.  Nuts, such as almonds.  Follow these recommended amounts for daily calcium intake:  Children, age 1?3: 700 mg.  Children, age 4?8: 1,000 mg.  Children, age 9?13: 1,300 mg.  Teens, age 14?18: 1,300 mg.  Adults, age 19?50: 1,000 mg.  Adults, age 51?70: ? Men: 1,000 mg. ? Women: 1,200 mg.  Adults, age 71 or older: 1,200 mg.  Pregnant and breastfeeding females: ? Teens: 1,300 mg. ? Adults: 1,000 mg.  Vitamin D Recommendations Vitamin D is the most essential vitamin for bone health. It helps the body to absorb calcium. Sunlight stimulates the skin to make vitamin D, so be sure to get enough sunlight. If you live in a cold climate or you do not get outside often, your health care provider may recommend that you take vitamin   D supplements. Good sources of vitamin D in your diet include:  Egg yolks.  Saltwater fish.  Milk and cereal fortified with vitamin D.  Follow these recommended amounts for daily vitamin D intake:  Children and teens, age 1?18: 600 international units.  Adults, age 50 or younger: 400-800 international units.  Adults, age 51 or older: 800-1,000 international units.  Other Nutrients Other nutrients  for bone health include:  Phosphorus. This mineral is found in meat, poultry, dairy foods, nuts, and legumes. The recommended daily intake for adult men and adult women is 700 mg.  Magnesium. This mineral is found in seeds, nuts, dark green vegetables, and legumes. The recommended daily intake for adult men is 400?420 mg. For adult women, it is 310?320 mg.  Vitamin K. This vitamin is found in green leafy vegetables. The recommended daily intake is 120 mg for adult men and 90 mg for adult women.  What type of physical activity is best for building and maintaining healthy bones? Weight-bearing and strength-building activities are important for building and maintaining peak bone mass. Weight-bearing activities cause muscles and bones to work against gravity. Strength-building activities increases muscle strength that supports bones. Weight-bearing and muscle-building activities include:  Walking and hiking.  Jogging and running.  Dancing.  Gym exercises.  Lifting weights.  Tennis and racquetball.  Climbing stairs.  Aerobics.  Adults should get at least 30 minutes of moderate physical activity on most days. Children should get at least 60 minutes of moderate physical activity on most days. Ask your health care provide what type of exercise is best for you. Where can I find more information? For more information, check out the following websites:  National Osteoporosis Foundation: http://nof.org/learn/basics  National Institutes of Health: http://www.niams.nih.gov/Health_Info/Bone/Bone_Health/bone_health_for_life.asp  This information is not intended to replace advice given to you by your health care provider. Make sure you discuss any questions you have with your health care provider. Document Released: 12/20/2003 Document Revised: 04/18/2016 Document Reviewed: 10/04/2014 Elsevier Interactive Patient Education  2018 [employer]  

## 2018-04-07 NOTE — Progress Notes (Signed)
[place]  Telephone:([phone]) [phone] Fax:(336) [phone]     ID: Angela Gross OB: 1950-12-08  MR#: [id number]  YWV#:[id number]  PCP: Helane Rima, MD GYN:   SU: Angela Gross OTHER MD: [name], Lyndee Leo Sanger, Arnoldo Hooker Thimmappa  CHIEF COMPLAINT:  Right Breast Cancer, triple positive  CURRENT TREATMENT:  anastrozole  BREAST CANCER HISTORY: From the original intake note:  Angela Gross had routine screening mammography at the breast center 09/21/2013 showing a possible mass in calcifications in the right breast. On 10/11/2013 she underwent right diagnostic mammography and ultrasonography. This confirmed a spiculated 1.2 cm mass in the central right breast, with a group of pleomorphic calcifications slightly laterally. The calcifications spanning 1.6 cm. A second group of calcifications extended inferiorly and spanned 8 mm. The entire area in aggregate measured 6.5 cm. By physical exam there was no palpable abnormality. Ultrasonography showed an irregular hypoechoic mass at the 6:00 position of the right breast measuring 1 cm maximally. The right axilla was normal.  Biopsy of the right breast mass in question as well as the more lateral area of calcifications on 10/11/2013 showed ([id number]) most to be positive for an invasive ductal carcinoma, both estrogen receptor 85-90% positive with strong staining intensity, both progesterone receptor negative, with an MIB-1 of 17%. HER-2 was amplified, with a signals ratio of 3.44 and a copy number per cell 04 0.30.  Bilateral breast MRI 10/19/2013 showed again a spiculated mass in the 6:00 region of the right breast measuring 1.2 cm, a slightly more lateral hematoma associated with a second biopsy and also with clumped enhancement, and asymmetric none masslike enhancement in most of the right breast, the entire area of abnormality measuring 9.0 cm. The left breast was unremarkable and there were no abnormal appearing lymph  nodes.  Her subsequent history is as detailed below   INTERVAL HISTORY: Angela Gross is doing well today.  She is here for f/u of her breast cancer.  She is taking Anastrozole daily and is tolerating this well.    She also receives Prolia every 6 months for her osteoporosis.  She is tolerating this well.  She notes no side effects such as bony aches/pains, flu like symptoms after receiving it.    REVIEW OF SYSTEMS: Angela Gross met with a podiatrist who did xrays of her feet about 3 weeks ago.  She was told her neuropathy was worsening and that she has arthritis in her feet. Angela Gross is exercising, and was recommended to continue the current dosage.  She is doing exercises with stretching it sounds like geared towards plantar fasciitis.  She says she has noted some improvement with this.  She has some balance issues, but not significant.    Angela Gross sees her PCP regularly and has lab work done regularly there.  She is up to date with her colon cancer screening.  She no longer requires Pap smears since she has undergone TAH, no h/o prior abnormal paps before her surgery.  She has not undergone skin cancer screen.  She does not have any tobacco history.    Otherwise, Angela Gross is doing well and a detailed ROS was non contributory.     PAST MEDICAL HISTORY: Past Medical History:  Diagnosis Date  . Anxiety   . Breast cancer ([place])    "right" (01/10/2014)  . Chronic left shoulder pain    "work related injury" (01/10/2014)  . Degenerative disc disease, cervical   . Fibromyalgia   . Full dentures   . Hypertension  no meds now  . Osteoarthritis of both knees   . Osteoporosis   . PONV (postoperative nausea and vomiting)   . Raynaud disease   . Rheumatoid arthritis ([place])    "elbows; fingers; toes"  . Syncope and collapse    "4 times in the last year" (01/10/2014)  . Type II diabetes mellitus ([facility])    has not taken meds in over a month (01/10/2014)  . Wears glasses     PAST SURGICAL HISTORY: Past  Surgical History:  Procedure Laterality Date  . BREAST BIOPSY Right 10/2013  . BREAST IMPLANT EXCHANGE Right 02/12/2016   Procedure: EXCHANGE SILICONE IMPLANT ON RIGHT, CAPSULORRAPHY AND PLACEMENT OF ALLODERM;  Surgeon: Irene Limbo, MD;  Location: [place];  Service: Plastics;  Laterality: Right;  . BREAST RECONSTRUCTION Right 12/12/2014   Procedure: REVISION OF RIGHT RECONSTRUCTIVE BREAST WITH CAPSULORRHAPHY PLACEMENT OF SILCONE IMPLANTS AND LIPOFILLING TO RIGHT CHEST ;  Surgeon: Irene Limbo, MD;  Location: [place];  Service: Plastics;  Laterality: Right;  . BREAST RECONSTRUCTION Right 03/06/2015   Procedure: RIGHT NIPPLE AEROLA COMPLEX RECONSTRUCTION AND LOCAL FLAP WITH LIPO FILLING TO RIGHT BREAST,REMOVAL RIGHT CHEST PORT;  Surgeon: Irene Limbo, MD;  Location: [place];  Service: Plastics;  Laterality: Right;  . BREAST RECONSTRUCTION WITH PLACEMENT OF TISSUE EXPANDER AND FLEX HD (ACELLULAR HYDRATED DERMIS) Right 12/13/2013   Procedure: RIGHT BREAST RECONSTRUCTION WITH PLACEMENT OF TISSUE EXPANDER AND FLEX HD (ACELLULAR HYDRATED DERMIS);  Surgeon: Irene Limbo, MD;  Location: [place];  Service: Plastics;  Laterality: Right;  . CHOLECYSTECTOMY  1970's  . COLONOSCOPY    . LIPOMA EXCISION Bilateral 12/13/2013   Procedure: EXCISION OF LEFT BREAST KELOID AND RIGHT CHEST KELOID;  Surgeon: Irene Limbo, MD;  Location: [place];  Service: Plastics;  Laterality: Bilateral;  . LIPOSUCTION WITH LIPOFILLING Right 09/26/2014   Procedure: LIPOSUCTION WITH LIPOFILLING;  Surgeon: Irene Limbo, MD;  Location: [place];  Service: Plastics;  Laterality: Right;  . LIPOSUCTION WITH LIPOFILLING Right 12/12/2014   Procedure: LIPOSUCTION WITH LIPOFILLING;  Surgeon: Irene Limbo, MD;  Location: [place];  Service: Plastics;  Laterality: Right;  . LIPOSUCTION WITH LIPOFILLING Right  03/06/2015   Procedure: ABDOMINAL LIPOSUCTION WITH LIPOFILLING TO RIGHT BREAST;  Surgeon: Irene Limbo, MD;  Location: [place];  Service: Plastics;  Laterality: Right;  . MASTECTOMY Right 2015  . MASTECTOMY W/ SENTINEL NODE BIOPSY Right 12/13/2013   Procedure: RIGHT TOTAL MASTECTOMY WITH SENTINEL LYMPH NODE BIOPSY;  Surgeon: Adin Hector, MD;  Location: [place];  Service: General;  Laterality: Right;  . MASTOPEXY Left 09/26/2014   Procedure: MASTOPEXY;  Surgeon: Irene Limbo, MD;  Location: [place];  Service: Plastics;  Laterality: Left;  . PORT-A-CATH REMOVAL Right 03/06/2015   Procedure: REMOVAL PORT-A-CATH;  Surgeon: Irene Limbo, MD;  Location: [place];  Service: Plastics;  Laterality: Right;  . PORTACATH PLACEMENT Right 12/13/2013   Procedure: INSERTION PORT-A-CATH;  Surgeon: Adin Hector, MD;  Location: [place];  Service: General;  Laterality: Right;  . REDUCTION MAMMAPLASTY Left 2015  . REMOVAL OF BILATERAL TISSUE EXPANDERS WITH PLACEMENT OF BILATERAL BREAST IMPLANTS Bilateral 09/26/2014   Procedure: REMOVAL OF RIGHT TISSUE EXPANDER WITH PLACEMENT OF RIGHT BREAST IMPLANT LIPOFILLING TO RIGHT BREAST/LEFT BREAST MASTOPEXY FOR SYMMETRY;  Surgeon: Irene Limbo, MD;  Location: [place];  Service: Plastics;  Laterality: Bilateral;  . TISSUE  EXPANDER PLACEMENT Right 01/10/2014   Procedure: Incision and Drainage Right Breast Seroma with TISSUE EXPANDER exchange;  Surgeon: Irene Limbo, MD;  Location: [place];  Service: Plastics;  Laterality: Right;  Angela Gross Kitchen VAGINAL HYSTERECTOMY  1980   no salpingo-oophoretomu    FAMILY HISTORY Family History  Problem Relation Age of Onset  . Heart disease Mother   . Hypertension Mother   . Breast cancer Mother   . Diabetes Mother   . Congestive Heart Failure Mother   . Glaucoma Mother    The patient is little information about her  father. Her mother died from a heart attack at the age of 71. She had been diagnosed with breast cancer in her late 59s. The patient had 4 brothers and 4 sisters. There is no other history of breast or ovarian cancer in the family to her knowledge.  GYNECOLOGIC HISTORY:    (Reviewed 04/04/2014) She does not recall her age of menarche. She underwent hysterectomy in her 72s. She did not receive hormone replacement. First live birth at 69. She was GX P2.  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 She is currently disabled secondary to her rheumatoid arthritis. Her husband Jimena Wieczorek is a retired Administrator. He now works part time at [employer]. Son [name] works in [place] as a Patent attorney. Daughter [name] is a Social worker.    ADVANCED DIRECTIVES: Not in place   HEALTH MAINTENANCE: (Updated 04/04/2014) Social History   Tobacco Use  . Smoking status: Never Smoker  . Smokeless tobacco: Never Used  Substance Use Topics  . Alcohol use: No  . Drug use: No     Colonoscopy: Not on file  PAP: Status post remote hysterectomy  Bone density: At [facility] 01/05/2009 showed osteopenia with a T score of -1.7  Lipid panel: Not on file   Allergies  Allergen Reactions  . Codeine Other (See Comments) and Hives    Altered mental status, numbness  Altered mental status, Numbess  . Diazepam     Other reaction(s): Delusions (intolerance) hallucinations     Current Outpatient Medications  Medication Sig Dispense Refill  . anastrozole (ARIMIDEX) 1 MG tablet TAKE 64m TABLET BY MOUTH ONCE DAILY 90 tablet 7  . gabapentin (NEURONTIN) 300 MG capsule Take 1 capsule (300 mg total) by mouth at bedtime. 90 capsule 4  . leflunomide (ARAVA) 20 MG tablet Take 1 tablet by mouth daily.    .Angela Gross KitchenPRESCRIPTION MEDICATION Inject 1 Dose into the skin once a week. Injectable medication  for arthritis, receives in the mail. Says it is Arava     No current facility-administered medications for this visit.     OBJECTIVE:   Vitals:   04/07/18 1133  BP: (!) 159/98  Pulse: 90  Resp: 19  Temp: 98.2 F (36.8 C)  SpO2: 99%     Body mass index is 28.01 kg/m.    ECOG FS: 1  Filed Weights   04/07/18 1133  Weight: 143 lb 6.4 oz (65 kg)   GENERAL: Patient is a well appearing female in no acute distress HEENT:  Sclerae anicteric.  Oropharynx clear and moist. No ulcerations or evidence of oropharyngeal candidiasis. Neck is supple.  NODES:  No cervical, supraclavicular, or axillary lymphadenopathy palpated.  BREAST EXAM: right breast s/p mastectomy and reconstruction, no nodularity, swelling noted, or masses noted, left breast s/p breast reduction, no nodules, or masses  noted  LUNGS:  Clear to auscultation bilaterally.  No wheezes or rhonchi. HEART:  Regular rate and rhythm. No murmur appreciated. ABDOMEN:  Soft, nontender.  Positive, normoactive bowel sounds. No organomegaly palpated. MSK:  No focal spinal tenderness to palpation. Full range of motion bilaterally in the upper extremities. EXTREMITIES:  No peripheral edema.   SKIN:  Clear with no obvious rashes or skin changes. No nail dyscrasia. NEURO:  Nonfocal. Well oriented.  Appropriate affect.    LAB RESULTS:   Lab Results  Component Value Date   WBC 7.3 04/07/2018   NEUTROABS 4.8 04/07/2018   HGB 11.4 (L) 04/07/2018   HCT 35.2 04/07/2018   MCV 84.8 04/07/2018   PLT 137 (L) 04/07/2018      Chemistry      Component Value Date/Time   NA 142 04/07/2018 1108   NA 139 09/16/2017 0810   K 4.0 04/07/2018 1108   K 3.9 09/16/2017 0810   CL 110 04/07/2018 1108   CO2 25 04/07/2018 1108   CO2 20 (L) 09/16/2017 0810   BUN 12 04/07/2018 1108   BUN 13.1 09/16/2017 0810   CREATININE 1.12 (H) 04/07/2018 1108   CREATININE 1.0 09/16/2017 0810      Component Value Date/Time   CALCIUM 9.2 04/07/2018 1108   CALCIUM  10.1 09/16/2017 0810   ALKPHOS 79 04/07/2018 1108   ALKPHOS 106 09/16/2017 0810   AST 13 (L) 04/07/2018 1108   AST 26 09/16/2017 0810   ALT 8 04/07/2018 1108   ALT 16 09/16/2017 0810   BILITOT 0.2 (L) 04/07/2018 1108   BILITOT 0.34 09/16/2017 0810     STUDIES:   ASSESSMENT: 67 y.o. [place] woman   (1)  s/p biopsy of separate Right breast masses 10/11/2013 for a clinical mT1 N0, stage IA invasive ductal carcinoma, grade II-III, one of the mass is being 85% estrogen receptor positive, progesterone receptor negative, with an MIB-1 of 17% and HER-2 amplified.  (2) status post right mastectomy and sentinel lymph node sampling with immediate expander placement 12/13/2013 for a pT1c pN0, stage IA invasive ductal carcinoma, grade 3, again HER-2 positive.  (3) treated in the adjuvant setting with carboplatin, docetaxel, trastuzumab and pertuzumab, first dose on 02/21/2014   (a). Patient developed significant peripheral neuropathy after one cycle of chemotherapy, and both carboplatin and docetaxel were held beginning with cycle 2. Continued pertuzumab/ trastuzumab x 5 more cycles, completed 08/01/2014  (b)  single agent trastuzumab started 08/29/2014, continued every 3 weeks until May 2016  (d) final echocardiogram 04/25/2015 showed a well preserved ejection fraction at 55%  (4) started on anastrozole on 04/04/2014 .   (a) DEXA scans 12/06/2013  shows osteoporosis, with the lowest T score at -2.49  (b) denosumab/Proluia to started 03/08/2015, repeated every 6 months while on anastrozole     (5) Rheumatoid arthritis, remicade and lyrica held until completion of trastuzumab. Controlled with oxycodone PRN  (6) chemotherapy-induced peripheral neuropathy  - ongabapentin, 300 BID, and tramadol, 50 mg every 6 hours as needed.    (7)  Implant reconstruction with silicone implant, right breast, with revision 02/12/2016  (a) left breast status post reduction mammoplasty   PLAN: Angela Gross is doing  well today.  She is clinically and radiographically without signs of breast cancer recurrence.  She will continue on Anastrozole daily and Prolia every 6 months as she is tolerating this well.  She is due for her mammogram and will have this done next month.    Angela Gross is due for  a bone density test.  I have ordered this to be done when she goes and gets her mammogram done.    Angela Gross and I reviewed healthy diet, exercise, and continued cancer surveillance (skin cancer, colon cancer) to be arranged by her PCP.    She will return for f/u in one year with Dr. Jana Hakim. She knows to call for any problems that may develop before that visit.  A total of (30) minutes of face-to-face time was spent with this patient with greater than 50% of that time in counseling and care-coordination.     Scot Dock, NP  04/07/2018 2:42 PMcumulative

## 2018-04-07 NOTE — Telephone Encounter (Signed)
Printed avs and calender of upcoming appointment. Per 6/26 los 

## 2018-04-26 ENCOUNTER — Ambulatory Visit: Payer: Medicare HMO

## 2018-04-26 ENCOUNTER — Encounter (HOSPITAL_BASED_OUTPATIENT_CLINIC_OR_DEPARTMENT_OTHER): Payer: Self-pay | Admitting: *Deleted

## 2018-04-26 ENCOUNTER — Other Ambulatory Visit: Payer: Self-pay

## 2018-04-26 ENCOUNTER — Other Ambulatory Visit: Payer: Self-pay | Admitting: Oncology

## 2018-04-26 DIAGNOSIS — C50411 Malignant neoplasm of upper-outer quadrant of right female breast: Secondary | ICD-10-CM

## 2018-04-27 ENCOUNTER — Encounter (HOSPITAL_BASED_OUTPATIENT_CLINIC_OR_DEPARTMENT_OTHER)
Admission: RE | Admit: 2018-04-27 | Discharge: 2018-04-27 | Disposition: A | Discharge status: Home / Self Care | Payer: Medicare HMO | Source: Ambulatory Visit | Attending: Plastic Surgery | Admitting: Plastic Surgery

## 2018-04-27 DIAGNOSIS — I10 Essential (primary) hypertension: Secondary | ICD-10-CM | POA: Diagnosis not present

## 2018-04-27 DIAGNOSIS — Z01810 Encounter for preprocedural cardiovascular examination: Secondary | ICD-10-CM | POA: Insufficient documentation

## 2018-04-30 ENCOUNTER — Emergency Department (HOSPITAL_COMMUNITY)
Admission: EM | Admit: 2018-04-30 | Discharge: 2018-04-30 | Disposition: A | Discharge status: Home / Self Care | Payer: Medicare HMO | Attending: Emergency Medicine | Admitting: Emergency Medicine

## 2018-04-30 ENCOUNTER — Encounter (HOSPITAL_COMMUNITY): Payer: Self-pay | Admitting: *Deleted

## 2018-04-30 ENCOUNTER — Other Ambulatory Visit: Payer: Self-pay

## 2018-04-30 DIAGNOSIS — R51 Headache: Secondary | ICD-10-CM | POA: Diagnosis not present

## 2018-04-30 DIAGNOSIS — E119 Type 2 diabetes mellitus without complications: Secondary | ICD-10-CM | POA: Insufficient documentation

## 2018-04-30 DIAGNOSIS — I10 Essential (primary) hypertension: Secondary | ICD-10-CM | POA: Insufficient documentation

## 2018-04-30 DIAGNOSIS — Z79899 Other long term (current) drug therapy: Secondary | ICD-10-CM | POA: Diagnosis not present

## 2018-04-30 DIAGNOSIS — Z853 Personal history of malignant neoplasm of breast: Secondary | ICD-10-CM | POA: Insufficient documentation

## 2018-04-30 DIAGNOSIS — R1110 Vomiting, unspecified: Secondary | ICD-10-CM | POA: Diagnosis present

## 2018-04-30 DIAGNOSIS — R519 Headache, unspecified: Secondary | ICD-10-CM

## 2018-04-30 LAB — URINALYSIS, ROUTINE W REFLEX MICROSCOPIC
Bacteria, UA: NONE SEEN
Bilirubin Urine: NEGATIVE
Glucose, UA: NEGATIVE mg/dL
Hgb urine dipstick: NEGATIVE
Ketones, ur: NEGATIVE mg/dL
LEUKOCYTES UA: NEGATIVE
Nitrite: NEGATIVE
PROTEIN: 30 mg/dL — AB
SPECIFIC GRAVITY, URINE: 1.024 (ref 1.005–1.030)
pH: 5 (ref 5.0–8.0)

## 2018-04-30 LAB — CBC WITH DIFFERENTIAL/PLATELET
Abs Immature Granulocytes: 0.1 10*3/uL (ref 0.0–0.1)
BASOS PCT: 0 %
Basophils Absolute: 0 10*3/uL (ref 0.0–0.1)
EOS ABS: 0 10*3/uL (ref 0.0–0.7)
EOS PCT: 0 %
HCT: 39.6 % (ref 36.0–46.0)
Hemoglobin: 12.2 g/dL (ref 12.0–15.0)
IMMATURE GRANULOCYTES: 0 %
Lymphocytes Relative: 14 %
Lymphs Abs: 1.7 10*3/uL (ref 0.7–4.0)
MCH: 27.4 pg (ref 26.0–34.0)
MCHC: 30.8 g/dL (ref 30.0–36.0)
MCV: 88.8 fL (ref 78.0–100.0)
Monocytes Absolute: 0.6 10*3/uL (ref 0.1–1.0)
Monocytes Relative: 5 %
NEUTROS PCT: 81 %
Neutro Abs: 9.8 10*3/uL — ABNORMAL HIGH (ref 1.7–7.7)
PLATELETS: 150 10*3/uL (ref 150–400)
RBC: 4.46 MIL/uL (ref 3.87–5.11)
RDW: 13.9 % (ref 11.5–15.5)
WBC: 12.2 10*3/uL — ABNORMAL HIGH (ref 4.0–10.5)

## 2018-04-30 LAB — COMPREHENSIVE METABOLIC PANEL
ALK PHOS: 75 U/L (ref 38–126)
ALT: 14 U/L (ref 0–44)
AST: 21 U/L (ref 15–41)
Albumin: 4.2 g/dL (ref 3.5–5.0)
Anion gap: 10 (ref 5–15)
BUN: 15 mg/dL (ref 8–23)
CALCIUM: 8.8 mg/dL — AB (ref 8.9–10.3)
CO2: 25 mmol/L (ref 22–32)
CREATININE: 1.04 mg/dL — AB (ref 0.44–1.00)
Chloride: 107 mmol/L (ref 98–111)
GFR, EST NON AFRICAN AMERICAN: 56 mL/min — AB (ref >60)
Glucose, Bld: 117 mg/dL — ABNORMAL HIGH (ref 70–99)
Potassium: 4.1 mmol/L (ref 3.5–5.1)
Sodium: 142 mmol/L (ref 135–145)
Total Bilirubin: 0.6 mg/dL (ref 0.3–1.2)
Total Protein: 7.1 g/dL (ref 6.5–8.1)

## 2018-04-30 LAB — LIPASE, BLOOD: LIPASE: 23 U/L (ref 11–51)

## 2018-04-30 MED ORDER — ACETAMINOPHEN 325 MG PO TABS: 650.0000 mg | ORAL_TABLET | Freq: Once | ORAL | Status: DC

## 2018-04-30 MED ORDER — ONDANSETRON 4 MG PO TBDP
4.0000 mg | ORAL_TABLET | Freq: Once | ORAL | Status: AC
  Administered 2018-04-30: 4 mg via ORAL
  Filled 2018-04-30: qty 1

## 2018-04-30 NOTE — ED Notes (Signed)
ED Provider at bedside. 

## 2018-04-30 NOTE — ED Triage Notes (Signed)
The pt vomited after she walked out into the heat after eating

## 2018-04-30 NOTE — ED Notes (Addendum)
Patient upset about wait, pt does not want CT scan and denies wanting tylenol. States she wants to leave AMA. Pt wheeled out without paperwork. Pt stable for d/c states she wants to follow up at later time for CT

## 2018-04-30 NOTE — ED Provider Notes (Signed)
[facility] EMERGENCY DEPARTMENT Provider Note   CSN: [id number] Arrival date & time: 04/30/18  1641     History   Chief Complaint Chief Complaint  Patient presents with  . Emesis    HPI Angela Gross is a 67 y.o. female.  HPI  67 year old female presents with vomiting.  She states this occurred around 2 or 2:30 PM.  She states she was in the car but not driving when all of a sudden she started feeling nauseated.  She also had a headache and started having headache, dizziness and nausea.  Eventually started throwing up.  The headache at that time was severe.  The headache has remained but it is improved to the point that it is now moderate and not as bothersome.  Minimal nausea.  No weakness or numbness.  No blurry vision or current dizziness.  No chest pain or abdominal pain.  Past Medical History:  Diagnosis Date  . Anxiety   . Breast cancer ([place])    "right" (01/10/2014)  . Chronic left shoulder pain    "work related injury" (01/10/2014)  . Degenerative disc disease, cervical   . Fibromyalgia   . Full dentures   . Hypertension   . Osteoarthritis of both knees   . Osteoporosis   . PONV (postoperative nausea and vomiting)   . Raynaud disease   . Rheumatoid arthritis ([employer])    "elbows; fingers; toes"  . Syncope and collapse    "4 times in the last year" (01/10/2014)  . Wears glasses     Patient Active Problem List   Diagnosis Date Noted  . Osteoporosis 02/27/2015  . Constipation 10/04/2014  . Personal history of malignant neoplasm of breast 09/26/2014  . Arthralgia of multiple sites 06/27/2014  . 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 Hot flashes, menopausal 04/04/2014  . Chemotherapy-induced neuropathy ([place]) 03/14/2014  . Anemia, unspecified 03/14/2014  . Hypokalemia 03/01/2014  . Anxiety 01/30/2014  . Malignant neoplasm of lower-inner quadrant of right breast of female, estrogen receptor positive ([place]) 10/25/2013  . C-REACTIVE  PROTEIN, SERUM, ELEVATED 03/07/2010  . DIABETES MELLITUS, TYPE II 03/04/2010  . ARTHRALGIA 03/04/2010    Past Surgical History:  Procedure Laterality Date  . BREAST BIOPSY Right 10/2013  . BREAST IMPLANT EXCHANGE Right 02/12/2016   Procedure: EXCHANGE SILICONE IMPLANT ON RIGHT, CAPSULORRAPHY AND PLACEMENT OF ALLODERM;  Surgeon: Irene Limbo, MD;  Location: [facility];  Service: Plastics;  Laterality: Right;  . BREAST RECONSTRUCTION Right 12/12/2014   Procedure: REVISION OF RIGHT RECONSTRUCTIVE BREAST WITH CAPSULORRHAPHY PLACEMENT OF SILCONE IMPLANTS AND LIPOFILLING TO RIGHT CHEST ;  Surgeon: Irene Limbo, MD;  Location: [facility];  Service: Plastics;  Laterality: Right;  . BREAST RECONSTRUCTION Right 03/06/2015   Procedure: RIGHT NIPPLE AEROLA COMPLEX RECONSTRUCTION AND LOCAL FLAP WITH LIPO FILLING TO RIGHT BREAST,REMOVAL RIGHT CHEST PORT;  Surgeon: Irene Limbo, MD;  Location: [facility];  Service: Plastics;  Laterality: Right;  . BREAST RECONSTRUCTION WITH PLACEMENT OF TISSUE EXPANDER AND FLEX HD (ACELLULAR HYDRATED DERMIS) Right 12/13/2013   Procedure: RIGHT BREAST RECONSTRUCTION WITH PLACEMENT OF TISSUE EXPANDER AND FLEX HD (ACELLULAR HYDRATED DERMIS);  Surgeon: Irene Limbo, MD;  Location: [facility];  Service: Plastics;  Laterality: Right;  . CHOLECYSTECTOMY  1970's  . COLONOSCOPY    . LIPOMA EXCISION Bilateral 12/13/2013   Procedure: EXCISION OF LEFT BREAST KELOID AND RIGHT CHEST KELOID;  Surgeon: Irene Limbo, MD;  Location: [facility];  Service: Clinical cytogeneticist;  Laterality: Bilateral;  . LIPOSUCTION WITH LIPOFILLING Right 09/26/2014   Procedure: LIPOSUCTION WITH LIPOFILLING;  Surgeon: Irene Limbo, MD;  Location: [facility];  Service: Plastics;  Laterality: Right;  . LIPOSUCTION WITH LIPOFILLING Right 12/12/2014   Procedure: LIPOSUCTION WITH LIPOFILLING;  Surgeon: Irene Limbo, MD;   Location: [place];  Service: Plastics;  Laterality: Right;  . LIPOSUCTION WITH LIPOFILLING Right 03/06/2015   Procedure: ABDOMINAL LIPOSUCTION WITH LIPOFILLING TO RIGHT BREAST;  Surgeon: Irene Limbo, MD;  Location: [facility];  Service: Plastics;  Laterality: Right;  . MASTECTOMY Right 2015  . MASTECTOMY W/ SENTINEL NODE BIOPSY Right 12/13/2013   Procedure: RIGHT TOTAL MASTECTOMY WITH SENTINEL LYMPH NODE BIOPSY;  Surgeon: Adin Hector, MD;  Location: [facility];  Service: General;  Laterality: Right;  . MASTOPEXY Left 09/26/2014   Procedure: MASTOPEXY;  Surgeon: Irene Limbo, MD;  Location: [facility];  Service: Plastics;  Laterality: Left;  . PORT-A-CATH REMOVAL Right 03/06/2015   Procedure: REMOVAL PORT-A-CATH;  Surgeon: Irene Limbo, MD;  Location: [facility];  Service: Plastics;  Laterality: Right;  . PORTACATH PLACEMENT Right 12/13/2013   Procedure: INSERTION PORT-A-CATH;  Surgeon: Adin Hector, MD;  Location: [facility];  Service: General;  Laterality: Right;  . REDUCTION MAMMAPLASTY Left 2015  . REMOVAL OF BILATERAL TISSUE EXPANDERS WITH PLACEMENT OF BILATERAL BREAST IMPLANTS Bilateral 09/26/2014   Procedure: REMOVAL OF RIGHT TISSUE EXPANDER WITH PLACEMENT OF RIGHT BREAST IMPLANT LIPOFILLING TO RIGHT BREAST/LEFT BREAST MASTOPEXY FOR SYMMETRY;  Surgeon: Irene Limbo, MD;  Location: [facility];  Service: Plastics;  Laterality: Bilateral;  . TISSUE EXPANDER PLACEMENT Right 01/10/2014   Procedure: Incision and Drainage Right Breast Seroma with TISSUE EXPANDER exchange;  Surgeon: Irene Limbo, MD;  Location: [facility];  Service: Plastics;  Laterality: Right;  Marland Kitchen VAGINAL HYSTERECTOMY  1980   no salpingo-oophoretomu     OB History   None      Home Medications    Prior to Admission medications   Medication Sig Start Date End Date Taking? Authorizing Provider    Abatacept (ORENCIA) 125 MG/ML SOSY Inject 125 mg as directed once a week. Take on Tuesdays 05/15/17  Yes [provider]  anastrozole (ARIMIDEX) 1 MG tablet TAKE 1 TABLET BY MOUTH ONCE DAILY 04/26/18  Yes Magrinat, Virgie Dad, MD  atorvastatin (LIPITOR) 20 MG tablet Take 20 mg by mouth daily.   Yes [provider]  denosumab (PROLIA) 60 MG/ML SOSY injection Inject 60 mg into the skin every 6 (six) months.   Yes [provider]  diclofenac (VOLTAREN) 75 MG EC tablet Take 75 mg by mouth 2 (two) times daily.   Yes [provider]  gabapentin (NEURONTIN) 300 MG capsule Take 1 capsule (300 mg total) by mouth at bedtime. Patient taking differently: Take 300-600 mg by mouth See admin instructions. Take 300mg  by mouth twice daily, in the morning and midday, Then take 600mg  at bedtime. 03/11/17  Yes Magrinat, Virgie Dad, MD  hydrochlorothiazide (MICROZIDE) 12.5 MG capsule Take 12.5 mg by mouth daily.   Yes [provider]  hydroxychloroquine (PLAQUENIL) 200 MG tablet Take 200 mg by mouth 2 (two) times daily.   Yes [provider]  leflunomide (ARAVA) 20 MG tablet Take 20 mg by mouth daily.   Yes [provider]    Family History Family History  Problem Relation Age of Onset  . Heart disease Mother   .  Hypertension Mother   . Breast cancer Mother   . Diabetes Mother   . Congestive Heart Failure Mother   . Glaucoma Mother     Social History Social History   Tobacco Use  . Smoking status: Never Smoker  . Smokeless tobacco: Never Used  Substance Use Topics  . Alcohol use: No  . Drug use: No     Allergies   Codeine and Diazepam   Review of Systems Review of Systems  Constitutional: Negative for fever.  Eyes: Negative for visual disturbance.  Cardiovascular: Negative for chest pain.  Gastrointestinal: Positive for nausea and vomiting. Negative for abdominal pain and diarrhea.  Neurological: Positive for dizziness and headaches.  Negative for weakness and numbness.  All other systems reviewed and are negative.    Physical Exam Updated Vital Signs BP (!) 167/98 (BP Location: Right Arm)   Pulse 68   Temp 97.8 F (36.6 C)   Resp 16   Ht 5' (1.524 m)   Wt 64.9 kg (143 lb)   SpO2 98%   BMI 27.93 kg/m   Physical Exam  Constitutional: She is oriented to person, place, and time. She appears well-developed and well-nourished.  HENT:  Head: Normocephalic and atraumatic.  Right Ear: External ear normal.  Left Ear: External ear normal.  Nose: Nose normal.  Eyes: Pupils are equal, round, and reactive to light. EOM are normal. Right eye exhibits no discharge. Left eye exhibits no discharge.  Cardiovascular: Normal rate, regular rhythm and normal heart sounds.  Pulmonary/Chest: Effort normal and breath sounds normal.  Abdominal: Soft. She exhibits no distension. There is no tenderness.  Neurological: She is alert and oriented to person, place, and time.  CN 3-12 grossly intact. 5/5 strength in all 4 extremities. Grossly normal sensation. Normal finger to nose.   Skin: Skin is warm and dry.  Nursing note and vitals reviewed.    ED Treatments / Results  Labs (all labs ordered are listed, but only abnormal results are displayed) Labs Reviewed  COMPREHENSIVE METABOLIC PANEL - Abnormal; Notable for the following components:      Result Value   Glucose, Bld 117 (*)    Creatinine, Ser 1.04 (*)    Calcium 8.8 (*)    GFR calc non Af Amer 56 (*)    All other components within normal limits  CBC WITH DIFFERENTIAL/PLATELET - Abnormal; Notable for the following components:   WBC 12.2 (*)    Neutro Abs 9.8 (*)    All other components within normal limits  URINALYSIS, ROUTINE W REFLEX MICROSCOPIC - Abnormal; Notable for the following components:   Protein, ur 30 (*)    All other components within normal limits  LIPASE, BLOOD    EKG None  Radiology No results found.  Procedures Procedures (including critical  care time)  Medications Ordered in ED Medications  acetaminophen (TYLENOL) tablet 650 mg (650 mg Oral Refused 04/30/18 2127)  ondansetron (ZOFRAN-ODT) disintegrating tablet 4 mg (4 mg Oral Given 04/30/18 2057)     Initial Impression / Assessment and Plan / ED Course  I have reviewed the triage vital signs and the nursing notes.  Pertinent labs & imaging results that were available during my care of the patient were reviewed by me and considered in my medical decision making (see chart for details).     Patient is well-appearing here.  Neuro exam is unremarkable.  No meningismus.  Given the acute headache and vomiting with no significant past medical history otherwise to have a  prior cause I recommended a head CT.  The patient agrees and understands the concern for possible subarachnoid hemorrhage in this setting.  I offered her Tylenol which she agreed as well.  While I was seeing another patient she was taken out to the waiting room AMA by the nursing staff.  I did not get a chance to reexamine the patient or reevaluate.   Final Clinical Impressions(s) / ED Diagnoses   Final diagnoses:  Vomiting in adult  Frontal headache    ED Discharge Orders    None       Sherwood Gambler, MD 04/30/18 2224

## 2018-04-30 NOTE — ED Provider Notes (Cosign Needed)
MSE was initiated and I personally evaluated the patient and placed orders (if any) at  5:12 PM on April 30, 2018.  The patient appears stable so that the remainder of the MSE may be completed by another provider.  Patient placed in Quick Look pathway, seen and evaluated   Chief Complaint: emesis  HPI:   Patient presents to ED for evaluation of 3 episodes of nonbloody, nonbilious emesis that began 3 hours ago.  States that she vomited on her way home from work while driving.  Denies any specific abdominal pain.  Reports nausea currently.  Denies any changes to bowel movements.  Does report some dysuria.  No sick contacts with similar symptoms.  Reports eating a normal meal for lunch.  ROS: Emesis  Physical Exam:   Gen: No distress  Neuro: Awake and Alert  Skin: Warm    Focused Exam: NAD.  No abdominal tenderness to palpation.   Initiation of care has begun. The patient has been counseled on the process, plan, and necessity for staying for the completion/evaluation, and the remainder of the medical screening examination    Delia Heady, PA-C 04/30/18 1713

## 2018-04-30 NOTE — ED Triage Notes (Signed)
Emesis after eating a baked potato 1400  She vomited oln the way home no pain just has nausea

## 2018-05-07 ENCOUNTER — Ambulatory Visit
Admission: RE | Admit: 2018-05-07 | Discharge: 2018-05-07 | Disposition: A | Discharge status: Home / Self Care | Payer: Medicare HMO | Source: Ambulatory Visit | Attending: Adult Health | Admitting: Adult Health

## 2018-05-07 ENCOUNTER — Ambulatory Visit
Admission: RE | Admit: 2018-05-07 | Discharge: 2018-05-07 | Disposition: A | Discharge status: Home / Self Care | Payer: Medicare HMO | Source: Ambulatory Visit | Attending: Oncology | Admitting: Oncology

## 2018-05-07 DIAGNOSIS — C50311 Malignant neoplasm of lower-inner quadrant of right female breast: Secondary | ICD-10-CM

## 2018-05-07 DIAGNOSIS — Z1231 Encounter for screening mammogram for malignant neoplasm of breast: Secondary | ICD-10-CM

## 2018-05-07 DIAGNOSIS — M818 Other osteoporosis without current pathological fracture: Secondary | ICD-10-CM

## 2018-05-07 DIAGNOSIS — Z170 Estrogen receptor positive status [ER+]: Principal | ICD-10-CM

## 2018-05-10 ENCOUNTER — Other Ambulatory Visit: Payer: Self-pay | Admitting: Adult Health

## 2018-05-10 ENCOUNTER — Other Ambulatory Visit: Payer: Self-pay | Admitting: Oncology

## 2018-05-10 DIAGNOSIS — R928 Other abnormal and inconclusive findings on diagnostic imaging of breast: Secondary | ICD-10-CM

## 2018-05-11 ENCOUNTER — Ambulatory Visit
Admission: RE | Admit: 2018-05-11 | Discharge: 2018-05-11 | Disposition: A | Discharge status: Home / Self Care | Payer: Medicare HMO | Source: Ambulatory Visit | Attending: Oncology | Admitting: Oncology

## 2018-05-11 ENCOUNTER — Ambulatory Visit: Payer: Medicare HMO

## 2018-05-11 ENCOUNTER — Ambulatory Visit (HOSPITAL_BASED_OUTPATIENT_CLINIC_OR_DEPARTMENT_OTHER): Admission: RE | Admit: 2018-05-11 | Payer: Medicare HMO | Source: Ambulatory Visit | Admitting: Plastic Surgery

## 2018-05-11 DIAGNOSIS — R928 Other abnormal and inconclusive findings on diagnostic imaging of breast: Secondary | ICD-10-CM

## 2018-05-11 HISTORY — DX: Personal history of antineoplastic chemotherapy: Z92.21

## 2018-05-11 SURGERY — RECONSTRUCTION, BREAST
Anesthesia: General | Site: Breast | Laterality: Right

## 2018-05-18 ENCOUNTER — Other Ambulatory Visit: Payer: Self-pay

## 2018-05-18 ENCOUNTER — Ambulatory Visit (HOSPITAL_BASED_OUTPATIENT_CLINIC_OR_DEPARTMENT_OTHER): Payer: Medicare HMO | Admitting: Certified Registered"

## 2018-05-18 ENCOUNTER — Encounter (HOSPITAL_BASED_OUTPATIENT_CLINIC_OR_DEPARTMENT_OTHER): Payer: Self-pay

## 2018-05-18 ENCOUNTER — Encounter (HOSPITAL_BASED_OUTPATIENT_CLINIC_OR_DEPARTMENT_OTHER)
Admission: RE | Disposition: A | Discharge status: Home / Self Care | Payer: Self-pay | Source: Ambulatory Visit | Attending: Plastic Surgery

## 2018-05-18 ENCOUNTER — Ambulatory Visit (HOSPITAL_BASED_OUTPATIENT_CLINIC_OR_DEPARTMENT_OTHER)
Admission: RE | Admit: 2018-05-18 | Discharge: 2018-05-18 | Disposition: A | Discharge status: Home / Self Care | Payer: Medicare HMO | Source: Ambulatory Visit | Attending: Plastic Surgery | Admitting: Plastic Surgery

## 2018-05-18 DIAGNOSIS — Z885 Allergy status to narcotic agent status: Secondary | ICD-10-CM | POA: Diagnosis not present

## 2018-05-18 DIAGNOSIS — Z888 Allergy status to other drugs, medicaments and biological substances status: Secondary | ICD-10-CM | POA: Insufficient documentation

## 2018-05-18 DIAGNOSIS — M797 Fibromyalgia: Secondary | ICD-10-CM | POA: Insufficient documentation

## 2018-05-18 DIAGNOSIS — M1990 Unspecified osteoarthritis, unspecified site: Secondary | ICD-10-CM | POA: Insufficient documentation

## 2018-05-18 DIAGNOSIS — Z421 Encounter for breast reconstruction following mastectomy: Secondary | ICD-10-CM | POA: Insufficient documentation

## 2018-05-18 DIAGNOSIS — Z9011 Acquired absence of right breast and nipple: Secondary | ICD-10-CM | POA: Diagnosis not present

## 2018-05-18 DIAGNOSIS — M069 Rheumatoid arthritis, unspecified: Secondary | ICD-10-CM | POA: Insufficient documentation

## 2018-05-18 DIAGNOSIS — I739 Peripheral vascular disease, unspecified: Secondary | ICD-10-CM | POA: Insufficient documentation

## 2018-05-18 DIAGNOSIS — Z79899 Other long term (current) drug therapy: Secondary | ICD-10-CM | POA: Diagnosis not present

## 2018-05-18 DIAGNOSIS — I10 Essential (primary) hypertension: Secondary | ICD-10-CM | POA: Insufficient documentation

## 2018-05-18 DIAGNOSIS — Z853 Personal history of malignant neoplasm of breast: Secondary | ICD-10-CM | POA: Diagnosis not present

## 2018-05-18 DIAGNOSIS — I7300 Raynaud's syndrome without gangrene: Secondary | ICD-10-CM | POA: Insufficient documentation

## 2018-05-18 HISTORY — PX: BREAST RECONSTRUCTION: SHX9

## 2018-05-18 SURGERY — RECONSTRUCTION, BREAST
Anesthesia: General | Site: Breast | Laterality: Right

## 2018-05-18 MED ORDER — LIDOCAINE HCL (CARDIAC) PF 100 MG/5ML IV SOSY
PREFILLED_SYRINGE | INTRAVENOUS | Status: DC | PRN
  Administered 2018-05-18: 30 mg via INTRAVENOUS

## 2018-05-18 MED ORDER — ROCURONIUM BROMIDE 10 MG/ML (PF) SYRINGE
PREFILLED_SYRINGE | INTRAVENOUS | Status: AC
  Filled 2018-05-18: qty 10

## 2018-05-18 MED ORDER — SODIUM CHLORIDE 0.9 % IV SOLN
INTRAVENOUS | Status: DC | PRN
  Administered 2018-05-18: 500 mL

## 2018-05-18 MED ORDER — METOPROLOL TARTRATE 5 MG/5ML IV SOLN
INTRAVENOUS | Status: AC
Start: 2018-05-18 — End: ?
  Filled 2018-05-18: qty 5

## 2018-05-18 MED ORDER — SUGAMMADEX SODIUM 200 MG/2ML IV SOLN
INTRAVENOUS | Status: DC | PRN
  Administered 2018-05-18: 200 mg via INTRAVENOUS

## 2018-05-18 MED ORDER — OXYCODONE HCL 5 MG PO TABS: 5.0000 mg | ORAL_TABLET | Freq: Once | ORAL | Status: DC | PRN

## 2018-05-18 MED ORDER — PROPOFOL 10 MG/ML IV BOLUS
INTRAVENOUS | Status: AC
  Filled 2018-05-18: qty 20

## 2018-05-18 MED ORDER — DEXAMETHASONE SODIUM PHOSPHATE 4 MG/ML IJ SOLN
INTRAMUSCULAR | Status: DC | PRN
  Administered 2018-05-18: 10 mg via INTRAVENOUS

## 2018-05-18 MED ORDER — FENTANYL CITRATE (PF) 100 MCG/2ML IJ SOLN: 50.0000 ug | INTRAMUSCULAR | Status: DC | PRN

## 2018-05-18 MED ORDER — SCOPOLAMINE 1 MG/3DAYS TD PT72
MEDICATED_PATCH | TRANSDERMAL | Status: DC | PRN
  Administered 2018-05-18: 1 via TRANSDERMAL

## 2018-05-18 MED ORDER — SCOPOLAMINE 1 MG/3DAYS TD PT72: 1.0000 | MEDICATED_PATCH | Freq: Once | TRANSDERMAL | Status: DC | PRN

## 2018-05-18 MED ORDER — HYDROMORPHONE HCL 1 MG/ML IJ SOLN: 0.2500 mg | INTRAMUSCULAR | Status: DC | PRN

## 2018-05-18 MED ORDER — OXYCODONE HCL 5 MG/5ML PO SOLN: 5.0000 mg | Freq: Once | ORAL | Status: DC | PRN

## 2018-05-18 MED ORDER — BUPIVACAINE-EPINEPHRINE (PF) 0.25% -1:200000 IJ SOLN
INTRAMUSCULAR | Status: AC
  Filled 2018-05-18: qty 30

## 2018-05-18 MED ORDER — PROPOFOL 500 MG/50ML IV EMUL
INTRAVENOUS | Status: DC | PRN
  Administered 2018-05-18: 100 ug/kg/min via INTRAVENOUS

## 2018-05-18 MED ORDER — ROCURONIUM BROMIDE 100 MG/10ML IV SOLN
INTRAVENOUS | Status: DC | PRN
  Administered 2018-05-18: 60 mg via INTRAVENOUS

## 2018-05-18 MED ORDER — SUGAMMADEX SODIUM 500 MG/5ML IV SOLN
INTRAVENOUS | Status: AC
  Filled 2018-05-18: qty 5

## 2018-05-18 MED ORDER — SCOPOLAMINE 1 MG/3DAYS TD PT72
MEDICATED_PATCH | TRANSDERMAL | Status: AC
  Filled 2018-05-18: qty 1

## 2018-05-18 MED ORDER — LIDOCAINE 2% (20 MG/ML) 5 ML SYRINGE
INTRAMUSCULAR | Status: AC
  Filled 2018-05-18: qty 5

## 2018-05-18 MED ORDER — CEFAZOLIN SODIUM-DEXTROSE 2-4 GM/100ML-% IV SOLN
INTRAVENOUS | Status: AC
  Filled 2018-05-18: qty 100

## 2018-05-18 MED ORDER — MIDAZOLAM HCL 2 MG/2ML IJ SOLN: 1.0000 mg | INTRAMUSCULAR | Status: DC | PRN

## 2018-05-18 MED ORDER — LACTATED RINGERS IV SOLN
INTRAVENOUS | Status: DC
  Administered 2018-05-18 (×3): via INTRAVENOUS

## 2018-05-18 MED ORDER — MEPERIDINE HCL 25 MG/ML IJ SOLN: 6.2500 mg | INTRAMUSCULAR | Status: DC | PRN

## 2018-05-18 MED ORDER — PROPOFOL 10 MG/ML IV BOLUS
INTRAVENOUS | Status: DC | PRN
  Administered 2018-05-18: 150 mg via INTRAVENOUS

## 2018-05-18 MED ORDER — CHLORHEXIDINE GLUCONATE CLOTH 2 % EX PADS: 6.0000 | MEDICATED_PAD | Freq: Once | CUTANEOUS | Status: DC

## 2018-05-18 MED ORDER — DEXAMETHASONE SODIUM PHOSPHATE 10 MG/ML IJ SOLN
INTRAMUSCULAR | Status: AC
  Filled 2018-05-18: qty 1

## 2018-05-18 MED ORDER — MIDAZOLAM HCL 5 MG/5ML IJ SOLN
INTRAMUSCULAR | Status: DC | PRN
  Administered 2018-05-18: 2 mg via INTRAVENOUS

## 2018-05-18 MED ORDER — FENTANYL CITRATE (PF) 100 MCG/2ML IJ SOLN
INTRAMUSCULAR | Status: AC
  Filled 2018-05-18: qty 2

## 2018-05-18 MED ORDER — BUPIVACAINE-EPINEPHRINE 0.25% -1:200000 IJ SOLN
INTRAMUSCULAR | Status: AC
  Filled 2018-05-18: qty 1

## 2018-05-18 MED ORDER — FENTANYL CITRATE (PF) 100 MCG/2ML IJ SOLN
INTRAMUSCULAR | Status: DC | PRN
  Administered 2018-05-18 (×2): 50 ug via INTRAVENOUS

## 2018-05-18 MED ORDER — ONDANSETRON HCL 4 MG/2ML IJ SOLN
INTRAMUSCULAR | Status: DC | PRN
  Administered 2018-05-18: 4 mg via INTRAVENOUS

## 2018-05-18 MED ORDER — CEFAZOLIN SODIUM-DEXTROSE 2-4 GM/100ML-% IV SOLN
2.0000 g | INTRAVENOUS | Status: AC
  Administered 2018-05-18: 2 g via INTRAVENOUS

## 2018-05-18 MED ORDER — ONDANSETRON HCL 4 MG/2ML IJ SOLN
INTRAMUSCULAR | Status: AC
  Filled 2018-05-18: qty 2

## 2018-05-18 MED ORDER — PROMETHAZINE HCL 25 MG/ML IJ SOLN: 6.2500 mg | INTRAMUSCULAR | Status: DC | PRN

## 2018-05-18 MED ORDER — SULFAMETHOXAZOLE-TRIMETHOPRIM 800-160 MG PO TABS: 1.0000 | ORAL_TABLET | Freq: Two times a day (BID) | ORAL | 0 refills | Status: DC

## 2018-05-18 MED ORDER — OXYCODONE-ACETAMINOPHEN 5-325 MG PO TABS: 1.0000 | ORAL_TABLET | ORAL | 0 refills | Status: AC | PRN

## 2018-05-18 MED ORDER — MIDAZOLAM HCL 2 MG/2ML IJ SOLN
INTRAMUSCULAR | Status: AC
  Filled 2018-05-18: qty 2

## 2018-05-18 SURGICAL SUPPLY — 68 items
BAG DECANTER FOR FLEXI CONT (MISCELLANEOUS) ×3 IMPLANT
BANDAGE ACE 6X5 VEL STRL LF (GAUZE/BANDAGES/DRESSINGS) IMPLANT
BINDER BREAST LRG (GAUZE/BANDAGES/DRESSINGS) IMPLANT
BINDER BREAST MEDIUM (GAUZE/BANDAGES/DRESSINGS) IMPLANT
BINDER BREAST XLRG (GAUZE/BANDAGES/DRESSINGS) ×3 IMPLANT
BINDER BREAST XXLRG (GAUZE/BANDAGES/DRESSINGS) IMPLANT
BLADE SURG 10 STRL SS (BLADE) ×12 IMPLANT
BLADE SURG 15 STRL LF DISP TIS (BLADE) ×1 IMPLANT
BLADE SURG 15 STRL SS (BLADE) ×2
BNDG GAUZE ELAST 4 BULKY (GAUZE/BANDAGES/DRESSINGS) ×6 IMPLANT
CANISTER SUCT 1200ML W/VALVE (MISCELLANEOUS) ×3 IMPLANT
CHLORAPREP W/TINT 26ML (MISCELLANEOUS) ×3 IMPLANT
COVER BACK TABLE 60X90IN (DRAPES) ×3 IMPLANT
COVER MAYO STAND STRL (DRAPES) ×3 IMPLANT
DECANTER SPIKE VIAL GLASS SM (MISCELLANEOUS) IMPLANT
DERMABOND ADVANCED (GAUZE/BANDAGES/DRESSINGS) ×2
DERMABOND ADVANCED .7 DNX12 (GAUZE/BANDAGES/DRESSINGS) ×1 IMPLANT
DRAIN CHANNEL 15F RND FF W/TCR (WOUND CARE) ×3 IMPLANT
DRAPE TOP ARMCOVERS (MISCELLANEOUS) ×3 IMPLANT
DRAPE U-SHAPE 76X120 STRL (DRAPES) ×3 IMPLANT
DRSG PAD ABDOMINAL 8X10 ST (GAUZE/BANDAGES/DRESSINGS) ×3 IMPLANT
ELECT BLADE 4.0 EZ CLEAN MEGAD (MISCELLANEOUS) ×3
ELECT COATED BLADE 2.86 ST (ELECTRODE) ×3 IMPLANT
ELECT REM PT RETURN 9FT ADLT (ELECTROSURGICAL) ×3
ELECTRODE BLDE 4.0 EZ CLN MEGD (MISCELLANEOUS) ×1 IMPLANT
ELECTRODE REM PT RTRN 9FT ADLT (ELECTROSURGICAL) ×1 IMPLANT
EVACUATOR SILICONE 100CC (DRAIN) ×3 IMPLANT
GLOVE BIO SURGEON STRL SZ 6 (GLOVE) ×3 IMPLANT
GOWN STRL REUS W/ TWL LRG LVL3 (GOWN DISPOSABLE) ×2 IMPLANT
GOWN STRL REUS W/TWL LRG LVL3 (GOWN DISPOSABLE) ×4
IMPL BREAST GEL 520CC (Breast) ×1 IMPLANT
IMPLANT BREAST GEL 520CC (Breast) ×3 IMPLANT
IV NS 500ML (IV SOLUTION) ×2
IV NS 500ML BAXH (IV SOLUTION) ×1 IMPLANT
KIT FILL SYSTEM UNIVERSAL (SET/KITS/TRAYS/PACK) IMPLANT
MARKER SKIN DUAL TIP RULER LAB (MISCELLANEOUS) IMPLANT
NEEDLE HYPO 25X1 1.5 SAFETY (NEEDLE) ×3 IMPLANT
PACK BASIN DAY SURGERY FS (CUSTOM PROCEDURE TRAY) ×3 IMPLANT
PENCIL BUTTON HOLSTER BLD 10FT (ELECTRODE) ×3 IMPLANT
PIN SAFETY STERILE (MISCELLANEOUS) ×3 IMPLANT
PUNCH BIOPSY DERMAL 4MM (MISCELLANEOUS) IMPLANT
SHEET MEDIUM DRAPE 40X70 STRL (DRAPES) ×3 IMPLANT
SIZER BREAST REUSE 520CC (SIZER) ×3
SIZER BREAST REUSE GEL 560CC (SIZER) ×3
SIZER BRST REUSE 520CC (SIZER) ×1 IMPLANT
SIZER BRST REUSE GEL 560CC (SIZER) ×1 IMPLANT
SLEEVE SCD COMPRESS KNEE MED (MISCELLANEOUS) ×3 IMPLANT
SPONGE LAP 18X18 RF (DISPOSABLE) ×6 IMPLANT
STAPLER VISISTAT 35W (STAPLE) ×3 IMPLANT
SUT ETHILON 2 0 FS 18 (SUTURE) ×3 IMPLANT
SUT MNCRL AB 4-0 PS2 18 (SUTURE) ×3 IMPLANT
SUT PDS 3-0 CT2 (SUTURE)
SUT PDS AB 2-0 CT2 27 (SUTURE) IMPLANT
SUT PDS II 3-0 CT2 27 ABS (SUTURE) IMPLANT
SUT PROLENE 2 0 CT2 30 (SUTURE) IMPLANT
SUT VIC AB 3-0 PS1 18 (SUTURE)
SUT VIC AB 3-0 PS1 18XBRD (SUTURE) IMPLANT
SUT VIC AB 3-0 SH 27 (SUTURE)
SUT VIC AB 3-0 SH 27X BRD (SUTURE) IMPLANT
SUT VICRYL 4-0 PS2 18IN ABS (SUTURE) ×3 IMPLANT
SYR 50ML LL SCALE MARK (SYRINGE) IMPLANT
SYR BULB IRRIGATION 50ML (SYRINGE) ×3 IMPLANT
SYR CONTROL 10ML LL (SYRINGE) IMPLANT
TOWEL GREEN STERILE FF (TOWEL DISPOSABLE) ×6 IMPLANT
TUBE CONNECTING 20'X1/4 (TUBING) ×1
TUBE CONNECTING 20X1/4 (TUBING) ×2 IMPLANT
UNDERPAD 30X30 (UNDERPADS AND DIAPERS) ×6 IMPLANT
YANKAUER SUCT BULB TIP NO VENT (SUCTIONS) ×3 IMPLANT

## 2018-05-18 NOTE — Anesthesia Preprocedure Evaluation (Signed)
Anesthesia Evaluation  Patient identified by MRN, date of birth, ID band Patient awake    Reviewed: Allergy & Precautions, NPO status , Patient's Chart, lab work & pertinent test results  History of Anesthesia Complications (+) PONV and history of anesthetic complications  Airway Mallampati: I  TM Distance: >3 FB Neck ROM: Full    Dental  (+) Edentulous Lower, Edentulous Upper   Pulmonary    Pulmonary exam normal breath sounds clear to auscultation       Cardiovascular hypertension, Pt. on medications + Peripheral Vascular Disease  Normal cardiovascular exam Rhythm:Regular Rate:Normal  ECHO 3/16 Study Conclusions  - Left ventricle: The cavity size was normal. Wall thickness was normal. Systolic function was normal. The estimated ejection fraction was in the range of 55% to 60%. Strain imaging may be inaccurate due to poor endocardial definition in the anteroseptal walls - GLPSS is -16%. Wall motion was normal; there were no regional wall motion abnormalities. Left ventricular diastolic function parameters were normal. Ejection fraction (MOD, 2-plane): 58%. - Mitral valve: Mildly thickened leaflets . There was mild regurgitation. - Left atrium: The atrium was normal in size.  Impressions:  - LVEF 55-60% - strain is abnormal at -16% (improved prior to the earlier study), however, I question its&' accuracy - there is poor anterior myocardial definition. I have measured EF by simpson&'s biplane to be 58%.    Neuro/Psych Anxiety    GI/Hepatic   Endo/Other    Renal/GU      Musculoskeletal  (+) Arthritis , Fibromyalgia -  Abdominal   Peds  Hematology   Anesthesia Other Findings   Reproductive/Obstetrics                             Anesthesia Physical  Anesthesia Plan  ASA: III  Anesthesia Plan: General   Post-op Pain Management:    Induction:  Intravenous  PONV Risk Score and Plan: 4 or greater and Ondansetron, Dexamethasone, Propofol infusion, Midazolam and Scopolamine patch - Pre-op  Airway Management Planned: LMA  Additional Equipment:   Intra-op Plan:   Post-operative Plan: Extubation in OR  Informed Consent: I have reviewed the patients History and Physical, chart, labs and discussed the procedure including the risks, benefits and alternatives for the proposed anesthesia with the patient or authorized representative who has indicated his/her understanding and acceptance.     Plan Discussed with: CRNA and Surgeon  Anesthesia Plan Comments:         Anesthesia Quick Evaluation

## 2018-05-18 NOTE — Op Note (Signed)
Operative Note   DATE OF OPERATION: 8.6.2019  LOCATION: [facility]-outpatient  SURGICAL DIVISION: Plastic Surgery  PREOPERATIVE DIAGNOSES:  1. History breast cancer 2. Acquired absence breast  POSTOPERATIVE DIAGNOSES:  same  PROCEDURE:  Revision right breast reconstruction with silicone implant exchange  SURGEON: Irene Limbo MD MBA  ASSISTANT: none  ANESTHESIA:  General.   EBL: 25 ml  COMPLICATIONS: None immediate.   INDICATIONS FOR PROCEDURE:  The patient, Angela Gross, is a 67 y.o. female born on 1951-07-28, is here for revision right reconstruction breast. Patient has undergone implant based reconstruction and has since lost significant amount of weight, reports asymmetry breast volume.   FINDINGS: Removed intact Natrelle Inspira Extra Full Biocell 580 implant. Capsule thin without mass, no seroma within cavity. Placed Natrelle Inspira Smooth Round Full Projection 520 ml implant, REF SRF-520 [id number]  DESCRIPTION OF PROCEDURE:  The patient's operative site was marked with the patient in the preoperative area. The patient was taken to the operating room. SCDs were placed and IV antibiotics were given. The patient's operative site was prepped and draped in a sterile fashion. A time out was performed and all information was confirmed to be correct.  Incision made in lateral extent mastectomy scar and carried through superficial fascia to implant capsule. Capsule entered. As noted in findings thin capsule without seroma or mass. Fully incorporated ADM noted over lower outer quadrant. Capsulectomy performed in areas where no ADM present. Sizer placed and patient brought to upright sitting position. Full projection 520 ml implant selected. Patient returned to supine position.The cavity was irrigated with solution containing Ancef, gentamicin, and bacitracin. Hemostasis was ensured. Cavity irrigated with Betadine. Implant placed in cavity and orientation implant ensured.  Skin closure completedwith 3-0 vicryl in fascial layer and 4-0 vicryl in dermis. Skin closure completed with 4-0 monocryl subcuticular and tissue adhesive.  The patient was allowed to wake from anesthesia, extubated and taken to the recovery room in satisfactory condition.   SPECIMENS: implant and capsule right chest  DRAINS: none  Irene Limbo, MD [facility] Plastic & Reconstructive Surgery [phone], pin [phone]

## 2018-05-18 NOTE — H&P (Signed)
Subjective:     Patient ID: Angela Gross is a 67 y.o. female.  Follow-up   4 years 4 months post mastectomy, 2 years 3 months post revision right breast. No c/o notes itching at times. Still active at [facility], has lost additional weight since last visit, total over 20 lb since time of implant placement.  Last MMG 04/2018 required diagnostic MMG but this was normal.  Path with negative nodes, 1.8 cm tumor. Er/PR+, Her 2 +.  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 Last MMG left breast 02/19/16 benign.  History significant for RA and Raynauds. [name] is her rheumatologist.  Mastectomy wt 1194 g Left mastopexy wt 277 g Reports D cup now.       Objective:   Physical Exam  Cardiovascular: Normal rate, regular rhythm and normal heart sounds.   Pulmonary/Chest: Effort normal and breath sounds normal.  Abdominal: Soft.    Right chest:  No lateral displacement, soft breast, left breast with bottoming out Right volume> left No masses, no axillary adenopathy SN to nipple L 21 Nipple to IMF 11 cm   Assessment:     R breast cancer ,  Herceptin complete Rheumatoid arthritis, Raynauds S/p TE/ADM reconstruction S/p Left breast lift, silicone implant right breast, lipofilling right S/p nipple creation, lipofilling S/p silicone implant exchange, Alloderm for lateral displacement    Plan:     Provided updated ed ASPS FAQ on ALCL. Discussed that she does have textured implant and reviewed onset swelling/seroma most common presentation. With weight loss she has lost volume over left, right is now larger. Plan revision to right to down size to smaller size. Patient states she is done losing weight. We reviewed ALCL risk and Biocell incidence, changes in [place] and [place]. Plan to switch to smooth round gel implant.   Reviewed her rheumatoid medications, ok to continue Arava and plaquenil. Hold Orencia 2 week prior and 2 week  to follow surgery. Plan OP surgery, no drains anticipated.   Natrelle Desma Maxim Extra Full Biocell 580 implant  120 ml fat grafted to right breast (#1), 110 ml fat infiltrated  (#2)  Irene Limbo, MD [facility] Plastic & Reconstructive Surgery [phone], pin [phone]

## 2018-05-18 NOTE — Anesthesia Procedure Notes (Signed)
Procedure Name: Intubation Date/Time: 05/18/2018 7:31 AM Performed by: Marrianne Mood, CRNA Pre-anesthesia Checklist: Patient identified, Emergency Drugs available, Suction available, Patient being monitored and Timeout performed Patient Re-evaluated:Patient Re-evaluated prior to induction Oxygen Delivery Method: Circle system utilized Preoxygenation: Pre-oxygenation with 100% oxygen Induction Type: IV induction Ventilation: Mask ventilation without difficulty Laryngoscope Size: Miller and 3 Grade View: Grade II Tube type: Oral Tube size: 7.0 mm Number of attempts: 1 Airway Equipment and Method: Stylet and Oral airway Placement Confirmation: ETT inserted through vocal cords under direct vision,  positive ETCO2 and breath sounds checked- equal and bilateral Tube secured with: Tape Dental Injury: Teeth and Oropharynx as per pre-operative assessment

## 2018-05-18 NOTE — Transfer of Care (Signed)
Immediate Anesthesia Transfer of Care Note  Patient: [name]  Procedure(s) Performed: revision right breast reconstruction with silicone implant exchange (Right Breast)  Patient Location: PACU  Anesthesia Type:General  Level of Consciousness: awake and patient cooperative  Airway & Oxygen Therapy: Patient Spontanous Breathing and Patient connected to face mask oxygen  Post-op Assessment: Report given to RN and Post -op Vital signs reviewed and stable  Post vital signs: Reviewed and stable  Last Vitals:  Vitals Value Taken Time  BP    Temp    Pulse 75 05/18/2018  8:36 AM  Resp    SpO2 100 % 05/18/2018  8:36 AM  Vitals shown include unvalidated device data.  Last Pain:  Vitals:   05/18/18 0646  TempSrc: Oral  PainSc: 8       Patients Stated Pain Goal: 2 (73/22/02 5427)  Complications: No apparent anesthesia complications

## 2018-05-18 NOTE — Discharge Instructions (Signed)

## 2018-05-19 ENCOUNTER — Encounter (HOSPITAL_BASED_OUTPATIENT_CLINIC_OR_DEPARTMENT_OTHER): Payer: Self-pay | Admitting: Plastic Surgery

## 2018-05-19 NOTE — Addendum Note (Signed)
Addendum  created 05/19/18 0956 by Tawni Millers, CRNA   Charge Capture section accepted

## 2018-05-19 NOTE — Anesthesia Postprocedure Evaluation (Signed)
Anesthesia Post Note  Patient: Angela Gross  Procedure(s) Performed: revision right breast reconstruction with silicone implant exchange (Right Breast)     Patient location during evaluation: PACU Anesthesia Type: General Level of consciousness: awake and alert Pain management: pain level controlled Vital Signs Assessment: post-procedure vital signs reviewed and stable Respiratory status: spontaneous breathing, nonlabored ventilation and respiratory function stable Cardiovascular status: blood pressure returned to baseline and stable Postop Assessment: no apparent nausea or vomiting Anesthetic complications: no    Last Vitals:  Vitals:   05/18/18 0919 05/18/18 1000  BP: (!) 162/114 (!) 170/95  Pulse: 77 78  Resp: 17 16  Temp:  36.5 C  SpO2: 100% 98%    Last Pain:  Vitals:   05/18/18 1000  TempSrc:   PainSc: 0-No pain                 Lynda Rainwater

## 2018-10-04 ENCOUNTER — Encounter: Payer: Self-pay | Admitting: Oncology

## 2018-10-05 ENCOUNTER — Other Ambulatory Visit: Payer: Self-pay

## 2018-10-05 DIAGNOSIS — Z170 Estrogen receptor positive status [ER+]: Principal | ICD-10-CM

## 2018-10-05 DIAGNOSIS — C50311 Malignant neoplasm of lower-inner quadrant of right female breast: Secondary | ICD-10-CM

## 2018-10-07 ENCOUNTER — Inpatient Hospital Stay: Payer: Medicare HMO | Attending: Oncology

## 2018-10-07 ENCOUNTER — Inpatient Hospital Stay: Payer: Medicare HMO

## 2018-10-07 DIAGNOSIS — M810 Age-related osteoporosis without current pathological fracture: Secondary | ICD-10-CM

## 2018-10-07 DIAGNOSIS — Z79811 Long term (current) use of aromatase inhibitors: Secondary | ICD-10-CM | POA: Insufficient documentation

## 2018-10-07 DIAGNOSIS — C50811 Malignant neoplasm of overlapping sites of right female breast: Secondary | ICD-10-CM | POA: Diagnosis present

## 2018-10-07 DIAGNOSIS — Z170 Estrogen receptor positive status [ER+]: Secondary | ICD-10-CM | POA: Diagnosis not present

## 2018-10-07 DIAGNOSIS — C50311 Malignant neoplasm of lower-inner quadrant of right female breast: Secondary | ICD-10-CM

## 2018-10-07 LAB — CMP (CANCER CENTER ONLY)
ALT: 21 U/L (ref 0–44)
AST: 18 U/L (ref 15–41)
Albumin: 3.8 g/dL (ref 3.5–5.0)
Alkaline Phosphatase: 85 U/L (ref 38–126)
Anion gap: 7 (ref 5–15)
BUN: 17 mg/dL (ref 8–23)
CHLORIDE: 105 mmol/L (ref 98–111)
CO2: 28 mmol/L (ref 22–32)
CREATININE: 1.14 mg/dL — AB (ref 0.44–1.00)
Calcium: 9.2 mg/dL (ref 8.9–10.3)
GFR, EST NON AFRICAN AMERICAN: 50 mL/min — AB (ref >60)
GFR, Est AFR Am: 58 mL/min — ABNORMAL LOW (ref >60)
Glucose, Bld: 88 mg/dL (ref 70–99)
Potassium: 3.8 mmol/L (ref 3.5–5.1)
SODIUM: 140 mmol/L (ref 135–145)
Total Bilirubin: 0.3 mg/dL (ref 0.3–1.2)
Total Protein: 6.9 g/dL (ref 6.5–8.1)

## 2018-10-07 LAB — CBC WITH DIFFERENTIAL (CANCER CENTER ONLY)
Abs Immature Granulocytes: 0.02 10*3/uL (ref 0.00–0.07)
BASOS ABS: 0 10*3/uL (ref 0.0–0.1)
Basophils Relative: 0 %
EOS PCT: 3 %
Eosinophils Absolute: 0.3 10*3/uL (ref 0.0–0.5)
HCT: 38.5 % (ref 36.0–46.0)
HEMOGLOBIN: 12 g/dL (ref 12.0–15.0)
Immature Granulocytes: 0 %
LYMPHS PCT: 28 %
Lymphs Abs: 2.4 10*3/uL (ref 0.7–4.0)
MCH: 26.5 pg (ref 26.0–34.0)
MCHC: 31.2 g/dL (ref 30.0–36.0)
MCV: 85 fL (ref 80.0–100.0)
MONO ABS: 0.8 10*3/uL (ref 0.1–1.0)
Monocytes Relative: 9 %
NRBC: 0 % (ref 0.0–0.2)
Neutro Abs: 5.2 10*3/uL (ref 1.7–7.7)
Neutrophils Relative %: 60 %
Platelet Count: 127 10*3/uL — ABNORMAL LOW (ref 150–400)
RBC: 4.53 MIL/uL (ref 3.87–5.11)
RDW: 13.6 % (ref 11.5–15.5)
WBC: 8.7 10*3/uL (ref 4.0–10.5)

## 2018-10-07 MED ORDER — DENOSUMAB 60 MG/ML ~~LOC~~ SOSY
60.0000 mg | PREFILLED_SYRINGE | Freq: Once | SUBCUTANEOUS | Status: AC
  Administered 2018-10-07: 60 mg via SUBCUTANEOUS

## 2018-10-07 NOTE — Patient Instructions (Signed)

## 2019-03-23 ENCOUNTER — Other Ambulatory Visit: Payer: Self-pay | Admitting: Physician Assistant

## 2019-03-23 DIAGNOSIS — M810 Age-related osteoporosis without current pathological fracture: Secondary | ICD-10-CM

## 2019-03-31 ENCOUNTER — Other Ambulatory Visit: Payer: Self-pay | Admitting: Physician Assistant

## 2019-03-31 DIAGNOSIS — Z1231 Encounter for screening mammogram for malignant neoplasm of breast: Secondary | ICD-10-CM

## 2019-04-05 ENCOUNTER — Telehealth: Payer: Self-pay

## 2019-04-05 NOTE — Telephone Encounter (Signed)
Called and left voicemail regarding pre-screening questions for appt on 6/24   

## 2019-04-06 ENCOUNTER — Other Ambulatory Visit: Payer: Self-pay

## 2019-04-06 ENCOUNTER — Inpatient Hospital Stay (HOSPITAL_BASED_OUTPATIENT_CLINIC_OR_DEPARTMENT_OTHER): Payer: Medicare HMO | Admitting: Oncology

## 2019-04-06 ENCOUNTER — Inpatient Hospital Stay: Payer: Medicare HMO | Attending: Oncology

## 2019-04-06 ENCOUNTER — Inpatient Hospital Stay: Payer: Medicare HMO

## 2019-04-06 ENCOUNTER — Ambulatory Visit: Payer: Medicare HMO

## 2019-04-06 VITALS — BP 164/94 | HR 90 | Temp 98.5°F | Resp 18

## 2019-04-06 VITALS — BP 153/84 | HR 81 | Temp 98.5°F | Resp 18 | Wt 157.9 lb

## 2019-04-06 DIAGNOSIS — M810 Age-related osteoporosis without current pathological fracture: Secondary | ICD-10-CM | POA: Diagnosis not present

## 2019-04-06 DIAGNOSIS — C50311 Malignant neoplasm of lower-inner quadrant of right female breast: Secondary | ICD-10-CM

## 2019-04-06 DIAGNOSIS — M069 Rheumatoid arthritis, unspecified: Secondary | ICD-10-CM | POA: Diagnosis not present

## 2019-04-06 DIAGNOSIS — C50811 Malignant neoplasm of overlapping sites of right female breast: Secondary | ICD-10-CM

## 2019-04-06 DIAGNOSIS — G620 Drug-induced polyneuropathy: Secondary | ICD-10-CM

## 2019-04-06 DIAGNOSIS — Z803 Family history of malignant neoplasm of breast: Secondary | ICD-10-CM

## 2019-04-06 DIAGNOSIS — Z79811 Long term (current) use of aromatase inhibitors: Secondary | ICD-10-CM

## 2019-04-06 DIAGNOSIS — Z9221 Personal history of antineoplastic chemotherapy: Secondary | ICD-10-CM

## 2019-04-06 DIAGNOSIS — Z9011 Acquired absence of right breast and nipple: Secondary | ICD-10-CM

## 2019-04-06 DIAGNOSIS — Z170 Estrogen receptor positive status [ER+]: Secondary | ICD-10-CM

## 2019-04-06 LAB — CBC WITH DIFFERENTIAL (CANCER CENTER ONLY)
Abs Immature Granulocytes: 0.05 10*3/uL (ref 0.00–0.07)
Basophils Absolute: 0 10*3/uL (ref 0.0–0.1)
Basophils Relative: 0 %
Eosinophils Absolute: 0.2 10*3/uL (ref 0.0–0.5)
Eosinophils Relative: 2 %
HCT: 39.9 % (ref 36.0–46.0)
Hemoglobin: 12.3 g/dL (ref 12.0–15.0)
Immature Granulocytes: 1 %
Lymphocytes Relative: 28 %
Lymphs Abs: 2.3 10*3/uL (ref 0.7–4.0)
MCH: 26.2 pg (ref 26.0–34.0)
MCHC: 30.8 g/dL (ref 30.0–36.0)
MCV: 84.9 fL (ref 80.0–100.0)
Monocytes Absolute: 1.2 10*3/uL — ABNORMAL HIGH (ref 0.1–1.0)
Monocytes Relative: 15 %
Neutro Abs: 4.5 10*3/uL (ref 1.7–7.7)
Neutrophils Relative %: 54 %
Platelet Count: 139 10*3/uL — ABNORMAL LOW (ref 150–400)
RBC: 4.7 MIL/uL (ref 3.87–5.11)
RDW: 15.3 % (ref 11.5–15.5)
WBC Count: 8.2 10*3/uL (ref 4.0–10.5)
nRBC: 0 % (ref 0.0–0.2)

## 2019-04-06 LAB — CMP (CANCER CENTER ONLY)
ALT: 10 U/L (ref 0–44)
AST: 14 U/L — ABNORMAL LOW (ref 15–41)
Albumin: 3.7 g/dL (ref 3.5–5.0)
Alkaline Phosphatase: 80 U/L (ref 38–126)
Anion gap: 10 (ref 5–15)
BUN: 13 mg/dL (ref 8–23)
CO2: 25 mmol/L (ref 22–32)
Calcium: 9 mg/dL (ref 8.9–10.3)
Chloride: 107 mmol/L (ref 98–111)
Creatinine: 1.28 mg/dL — ABNORMAL HIGH (ref 0.44–1.00)
GFR, Est AFR Am: 51 mL/min — ABNORMAL LOW (ref >60)
GFR, Estimated: 44 mL/min — ABNORMAL LOW (ref >60)
Glucose, Bld: 79 mg/dL (ref 70–99)
Potassium: 3.8 mmol/L (ref 3.5–5.1)
Sodium: 142 mmol/L (ref 135–145)
Total Bilirubin: 0.3 mg/dL (ref 0.3–1.2)
Total Protein: 6.8 g/dL (ref 6.5–8.1)

## 2019-04-06 MED ORDER — DENOSUMAB 60 MG/ML ~~LOC~~ SOSY
PREFILLED_SYRINGE | SUBCUTANEOUS | Status: AC
  Filled 2019-04-06: qty 1

## 2019-04-06 MED ORDER — DENOSUMAB 60 MG/ML ~~LOC~~ SOSY
60.0000 mg | PREFILLED_SYRINGE | Freq: Once | SUBCUTANEOUS | Status: AC
  Administered 2019-04-06: 60 mg via SUBCUTANEOUS

## 2019-04-06 NOTE — Progress Notes (Signed)
[place]  [phone] [phone]) [phone]     ID: [name] OB: [birth year]  MR#: [id number]  SEG#:[id number]  Patient Care Team: Jeanella Anton, NP as PCP - General (Nurse Practitioner) [name], MD as Consulting Physician (Rheumatology) , Virgie Dad, MD as Consulting Physician (Oncology) Irene Limbo, MD as Consulting Physician (Plastic Surgery) Fanny Skates, MD as Consulting Physician (General Surgery) OTHER MD:   CHIEF COMPLAINT:  Right Breast Cancer, triple positive (s/p right mastectomy)  CURRENT TREATMENT: Completing 5 years of anastrozole; Prolia   INTERVAL HISTORY: Angela Gross returns today for follow up of her triple positive breast cancer.  She continues on anastrozole with good tolerance. She reports occasional hot flashes and vaginal dryness. She uses Vaseline for relief of the vaginal dryness.  She also receives Prolia every 6 months for her osteoporosis. Her most recent dose was 10/07/2018 with a dose due today. She is tolerating this well.  She reports bony aches for a day or two following the shot.  Since her last visit, she underwent bone density testing on 05/07/2018. This showed a T-score of -1.9, which is considered osteopenic.  This is significantly improved from her prior bone density scan, 12/07/2023, which showed a T score of -2.49  She also underwent left screening mammography with tomography at [facility] on 05/07/2018 showing: breast density category B; possible asymmetries.  She proceeded to left diagnostic mammogram on 05/11/2018, which showed: breast density category B; resolution of the two left breast asymmetries consistent with overlapping fibroglandular tissue.  She is scheduled for repeat left screening mammogram on 05/11/2019 and repeat bone density on 06/09/2019.   REVIEW OF SYSTEMS: Agam reports arthritis flare-ups. She sees Dr. Trudie Reed every 3 months for this. She reports stumbling  some this year, but she denies any falls. She notes this is improved from last year when she fell a lot.  She has had no unusual headaches visual changes cough phlegm production pleurisy or shortness of breath fever or diarrhea.  A detailed review of systems was otherwise entirely negative.   BREAST CANCER HISTORY: From the original intake note:  Angela Gross had routine screening mammography at the breast center 09/21/2013 showing a possible mass in calcifications in the right breast. On 10/11/2013 she underwent right diagnostic mammography and ultrasonography. This confirmed a spiculated 1.2 cm mass in the central right breast, with a group of pleomorphic calcifications slightly laterally. The calcifications spanning 1.6 cm. A second group of calcifications extended inferiorly and spanned 8 mm. The entire area in aggregate measured 6.5 cm. By physical exam there was no palpable abnormality. Ultrasonography showed an irregular hypoechoic mass at the 6:00 position of the right breast measuring 1 cm maximally. The right axilla was normal.  Biopsy of the right breast mass in question as well as the more lateral area of calcifications on 10/11/2013 showed ([id number]) most to be positive for an invasive ductal carcinoma, both estrogen receptor 85-90% positive with strong staining intensity, both progesterone receptor negative, with an MIB-1 of 17%. HER-2 was amplified, with a signals ratio of 3.44 and a copy number per cell 04 0.30.  Bilateral breast MRI 10/19/2013 showed again a spiculated mass in the 6:00 region of the right breast measuring 1.2 cm, a slightly more lateral hematoma associated with a second biopsy and also with clumped enhancement, and asymmetric none masslike enhancement in most of the right breast, the entire area of abnormality measuring 9.0 cm. The left breast was unremarkable and there were no abnormal  appearing lymph nodes.  Her subsequent history is as detailed below    PAST MEDICAL  HISTORY: Past Medical History:  Diagnosis Date  . Anxiety   . Breast cancer ([place])    "right" (01/10/2014)  . Chronic left shoulder pain    "work related injury" (01/10/2014)  . Degenerative disc disease, cervical   . Fibromyalgia   . Full dentures   . Hypertension   . Osteoarthritis of both knees   . Osteoporosis   . Personal history of chemotherapy   . PONV (postoperative nausea and vomiting)   . Raynaud disease   . Rheumatoid arthritis ([place])    "elbows; fingers; toes"  . Syncope and collapse    "4 times in the last year" (01/10/2014)  . Wears glasses     PAST SURGICAL HISTORY: Past Surgical History:  Procedure Laterality Date  . BREAST BIOPSY Right 10/2013  . BREAST IMPLANT EXCHANGE Right 02/12/2016   Procedure: EXCHANGE SILICONE IMPLANT ON RIGHT, CAPSULORRAPHY AND PLACEMENT OF ALLODERM;  Surgeon: Irene Limbo, MD;  Location: [place];  Service: Plastics;  Laterality: Right;  . BREAST RECONSTRUCTION Right 12/12/2014   Procedure: REVISION OF RIGHT RECONSTRUCTIVE BREAST WITH CAPSULORRHAPHY PLACEMENT OF SILCONE IMPLANTS AND LIPOFILLING TO RIGHT CHEST ;  Surgeon: Irene Limbo, MD;  Location: [place];  Service: Plastics;  Laterality: Right;  . BREAST RECONSTRUCTION Right 03/06/2015   Procedure: RIGHT NIPPLE AEROLA COMPLEX RECONSTRUCTION AND LOCAL FLAP WITH LIPO FILLING TO RIGHT BREAST,REMOVAL RIGHT CHEST PORT;  Surgeon: Irene Limbo, MD;  Location: [place];  Service: Plastics;  Laterality: Right;  . BREAST RECONSTRUCTION Right 05/18/2018   Procedure: revision right breast reconstruction with silicone implant exchange;  Surgeon: Irene Limbo, MD;  Location: [place];  Service: Plastics;  Laterality: Right;  . BREAST RECONSTRUCTION WITH PLACEMENT OF TISSUE EXPANDER AND FLEX HD (ACELLULAR HYDRATED DERMIS) Right 12/13/2013   Procedure: RIGHT BREAST RECONSTRUCTION WITH PLACEMENT OF TISSUE EXPANDER AND FLEX HD  (ACELLULAR HYDRATED DERMIS);  Surgeon: Irene Limbo, MD;  Location: [place];  Service: Plastics;  Laterality: Right;  . CHOLECYSTECTOMY  1970's  . COLONOSCOPY    . LIPOMA EXCISION Bilateral 12/13/2013   Procedure: EXCISION OF LEFT BREAST KELOID AND RIGHT CHEST KELOID;  Surgeon: Irene Limbo, MD;  Location: [place];  Service: Plastics;  Laterality: Bilateral;  . LIPOSUCTION WITH LIPOFILLING Right 09/26/2014   Procedure: LIPOSUCTION WITH LIPOFILLING;  Surgeon: Irene Limbo, MD;  Location: [place];  Service: Plastics;  Laterality: Right;  . LIPOSUCTION WITH LIPOFILLING Right 12/12/2014   Procedure: LIPOSUCTION WITH LIPOFILLING;  Surgeon: Irene Limbo, MD;  Location: [place];  Service: Plastics;  Laterality: Right;  . LIPOSUCTION WITH LIPOFILLING Right 03/06/2015   Procedure: ABDOMINAL LIPOSUCTION WITH LIPOFILLING TO RIGHT BREAST;  Surgeon: Irene Limbo, MD;  Location: [place];  Service: Plastics;  Laterality: Right;  . MASTECTOMY Right 2015  . MASTECTOMY W/ SENTINEL NODE BIOPSY Right 12/13/2013   Procedure: RIGHT TOTAL MASTECTOMY WITH SENTINEL LYMPH NODE BIOPSY;  Surgeon: Adin Hector, MD;  Location: [place];  Service: General;  Laterality: Right;  . MASTOPEXY Left 09/26/2014   Procedure: MASTOPEXY;  Surgeon: Irene Limbo, MD;  Location: [place];  Service: Plastics;  Laterality: Left;  . PORT-A-CATH REMOVAL Right 03/06/2015   Procedure: REMOVAL PORT-A-CATH;  Surgeon: Irene Limbo, MD;  Location: [place];  Service: Plastics;  Laterality: Right;  . PORTACATH  PLACEMENT Right 12/13/2013   Procedure: INSERTION PORT-A-CATH;  Surgeon: Adin Hector, MD;  Location: [place];  Service: General;  Laterality: Right;  . REDUCTION MAMMAPLASTY Left 2015  . REMOVAL OF BILATERAL TISSUE EXPANDERS WITH PLACEMENT OF BILATERAL BREAST  IMPLANTS Bilateral 09/26/2014   Procedure: REMOVAL OF RIGHT TISSUE EXPANDER WITH PLACEMENT OF RIGHT BREAST IMPLANT LIPOFILLING TO RIGHT BREAST/LEFT BREAST MASTOPEXY FOR SYMMETRY;  Surgeon: Irene Limbo, MD;  Location: [place];  Service: Plastics;  Laterality: Bilateral;  . TISSUE EXPANDER PLACEMENT Right 01/10/2014   Procedure: Incision and Drainage Right Breast Seroma with TISSUE EXPANDER exchange;  Surgeon: Irene Limbo, MD;  Location: [place];  Service: Plastics;  Laterality: Right;  Marland Kitchen VAGINAL HYSTERECTOMY  1980   no salpingo-oophoretomu    FAMILY HISTORY Family History  Problem Relation Age of Onset  . Heart disease Mother   . Hypertension Mother   . Breast cancer Mother   . Diabetes Mother   . Congestive Heart Failure Mother   . Glaucoma Mother   . Breast cancer Sister    The patient is little information about her father. Her mother died from a heart attack at the age of 53. She had been diagnosed with breast cancer in her late 41s. The patient had 4 brothers and 4 sisters. There is no other history of breast or ovarian cancer in the family to her knowledge.  GYNECOLOGIC HISTORY:    (Reviewed 04/04/2014) She does not recall her age of menarche. She underwent hysterectomy in her 25s. She did not receive hormone replacement. First live birth at 32. She was GX P2.  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 She is currently disabled secondary to her rheumatoid arthritis. Her husband Khrista Braun is a retired Administrator. He now works part time at [employer]. Son [name] works in [place] as a Patent attorney. Daughter [name] is a Social worker.    ADVANCED DIRECTIVES: Not in place   HEALTH MAINTENANCE: (Updated 04/04/2014) Social History   Tobacco Use  . Smoking status: Never Smoker  . Smokeless tobacco: Never Used  Substance Use Topics   . Alcohol use: No  . Drug use: No     Colonoscopy: Not on file  PAP: Status post remote hysterectomy  Bone density: 05/07/2018, -1.9 [facility])  Lipid panel: Not on file   Allergies  Allergen Reactions  . Codeine Hives and Other (See Comments)    Altered mental status, numbness   . Diazepam Other (See Comments)    Delusions, hallucinations     Current Outpatient Medications  Medication Sig Dispense Refill  . anastrozole (ARIMIDEX) 1 MG tablet TAKE 1 TABLET BY MOUTH ONCE DAILY 90 tablet 7  . atorvastatin (LIPITOR) 20 MG tablet Take 20 mg by mouth daily.    Marland Kitchen denosumab (PROLIA) 60 MG/ML SOSY injection Inject 60 mg into the skin every 6 (six) months.    . diclofenac (VOLTAREN) 75 MG EC tablet Take 75 mg by mouth 2 (two) times daily.    Marland Kitchen gabapentin (NEURONTIN) 300 MG capsule Take 1 capsule (300 mg total) by mouth at bedtime. (Patient taking differently: Take 300-600 mg by mouth See admin instructions. Take 376m by mouth twice daily, in the morning and midday, Then take 6080mat bedtime.) 90 capsule 4  . hydrochlorothiazide (MICROZIDE) 12.5 MG capsule Take 12.5 mg by mouth daily.    .Marland Kitchen  hydroxychloroquine (PLAQUENIL) 200 MG tablet Take 200 mg by mouth 2 (two) times daily.    Marland Kitchen leflunomide (ARAVA) 20 MG tablet Take 20 mg by mouth daily.    Marland Kitchen oxyCODONE-acetaminophen (PERCOCET) 5-325 MG tablet Take 1 tablet by mouth every 4 (four) hours as needed for severe pain. 20 tablet 0  . sulfamethoxazole-trimethoprim (BACTRIM DS,SEPTRA DS) 800-160 MG tablet Take 1 tablet by mouth 2 (two) times daily. 14 tablet 0   No current facility-administered medications for this visit.     OBJECTIVE:   Vitals:   04/06/19 1131  BP: (!) 153/84  Pulse: 81  Resp: 18  Temp: 98.5 F (36.9 C)  SpO2: 99%     Body mass index is 30.84 kg/m.    ECOG FS: 1  Filed Weights   04/06/19 1131  Weight: 157 lb 14.4 oz (71.6 kg)   Sclerae unicteric, EOMs intact No cervical or supraclavicular  adenopathy Lungs no rales or rhonchi Heart regular rate and rhythm Abd soft, nontender, positive bowel sounds MSK no focal spinal tenderness, no upper extremity lymphedema Neuro: nonfocal, well oriented, appropriate affect Breasts: The right breast is status post mastectomy with silicone implant reconstruction; there is no evidence of local recurrence left breast is status post reduction mammoplasty.  It is otherwise unremarkable.  Both axillae are benign.   LAB RESULTS:   Lab Results  Component Value Date   WBC 8.2 04/06/2019   NEUTROABS 4.5 04/06/2019   HGB 12.3 04/06/2019   HCT 39.9 04/06/2019   MCV 84.9 04/06/2019   PLT 139 (L) 04/06/2019      Chemistry      Component Value Date/Time   NA 140 10/07/2018 1146   NA 139 09/16/2017 0810   K 3.8 10/07/2018 1146   K 3.9 09/16/2017 0810   CL 105 10/07/2018 1146   CO2 28 10/07/2018 1146   CO2 20 (L) 09/16/2017 0810   BUN 17 10/07/2018 1146   BUN 13.1 09/16/2017 0810   CREATININE 1.14 (H) 10/07/2018 1146   CREATININE 1.0 09/16/2017 0810      Component Value Date/Time   CALCIUM 9.2 10/07/2018 1146   CALCIUM 10.1 09/16/2017 0810   ALKPHOS 85 10/07/2018 1146   ALKPHOS 106 09/16/2017 0810   AST 18 10/07/2018 1146   AST 26 09/16/2017 0810   ALT 21 10/07/2018 1146   ALT 16 09/16/2017 0810   BILITOT 0.3 10/07/2018 1146   BILITOT 0.34 09/16/2017 0810     STUDIES: No results found.   ASSESSMENT: 68 y.o.  woman   (1)  s/p biopsy of separate Right breast masses 10/11/2013 for a clinical mT1 N0, stage IA invasive ductal carcinoma, grade II-III, one of the mass is being 85% estrogen receptor positive, progesterone receptor negative, with an MIB-1 of 17% and HER-2 amplified.  (2) status post right mastectomy and sentinel lymph node sampling with immediate expander placement 12/13/2013 for a pT1c pN0, stage IA invasive ductal carcinoma, grade 3, again HER-2 positive.  (3) treated in the adjuvant setting with  carboplatin, docetaxel, trastuzumab and pertuzumab, first dose on 02/21/2014   (a). Patient developed significant peripheral neuropathy after one cycle of chemotherapy, and both carboplatin and docetaxel were held beginning with cycle 2. Continued pertuzumab/ trastuzumab x 5 more cycles, completed 08/01/2014  (b)  single agent trastuzumab started 08/29/2014, continued every 3 weeks until May 2016  (d) final echocardiogram 04/25/2015 showed a well preserved ejection fraction at 55%  (4) started on anastrozole on 04/04/2014 .   (a) DEXA  scans 12/06/2013  shows osteoporosis, with the lowest T score at -2.49  (b) denosumab/Proluia to started 03/08/2015, repeated every 6 months while on anastrozole, final dose 04/06/2019  (c) DEXA scan 05/07/2018 shows a T score of -1.9.     (5) Rheumatoid arthritis, treatment and follow-up per Dr. Trudie Reed  (6) chemotherapy-induced peripheral neuropathy  -on pregabalin and tramadol, 50 mg every 6 hours as needed.    (7)  Implant reconstruction with silicone implant, right breast, with revision 02/12/2016  (a) left breast status post reduction mammoplasty   PLAN: Cecilee is now a few months over 5 years out from her definitive surgery with no evidence of disease recurrence.  This is very favorable.  She is completing 5 years of anastrozole.  She may now stop that medication.  I would not anticipate any significant further benefit from continuing an additional 2 years  She has received Prolia as listed above and it has benefited her.  She will receive a dose today.  At this point I feel comfortable releasing her to her primary care physicians care.  All Britiany will need in terms of breast cancer follow-up is a yearly physician breast exam and yearly mammography, preferably with tomography.  Her osteopenia can further be followed and treated at the discretion of her primary care physician  I will be glad to see Stephannie again at any point in the future if and when  the need arises but as of now are making no further routine appointments for her here of now  [place].  MD Oncology and Hematology [facility] Tel. [phone]  FAX [phone]   I, Wilburn Mylar, am acting as scribe for Dr. Virgie Dad. .  I, Lurline Del MD, have reviewed the above documentation for accuracy and completeness, and I agree with the above.

## 2019-04-06 NOTE — Patient Instructions (Signed)

## 2019-05-11 ENCOUNTER — Ambulatory Visit
Admission: RE | Admit: 2019-05-11 | Discharge: 2019-05-11 | Disposition: A | Discharge status: Home / Self Care | Payer: Medicare HMO | Source: Ambulatory Visit | Attending: Physician Assistant | Admitting: Physician Assistant

## 2019-05-11 ENCOUNTER — Other Ambulatory Visit: Payer: Self-pay

## 2019-05-11 DIAGNOSIS — Z1231 Encounter for screening mammogram for malignant neoplasm of breast: Secondary | ICD-10-CM

## 2019-06-03 ENCOUNTER — Emergency Department (HOSPITAL_COMMUNITY)
Admission: EM | Admit: 2019-06-03 | Discharge: 2019-06-03 | Discharge status: Against Medical Advice | Payer: Medicare HMO | Attending: Emergency Medicine | Admitting: Emergency Medicine

## 2019-06-03 ENCOUNTER — Other Ambulatory Visit: Payer: Self-pay

## 2019-06-03 ENCOUNTER — Emergency Department (HOSPITAL_COMMUNITY): Payer: Medicare HMO

## 2019-06-03 DIAGNOSIS — Z5320 Procedure and treatment not carried out because of patient's decision for unspecified reasons: Secondary | ICD-10-CM | POA: Diagnosis not present

## 2019-06-03 DIAGNOSIS — M549 Dorsalgia, unspecified: Secondary | ICD-10-CM

## 2019-06-03 DIAGNOSIS — M546 Pain in thoracic spine: Secondary | ICD-10-CM | POA: Insufficient documentation

## 2019-06-03 DIAGNOSIS — R51 Headache: Secondary | ICD-10-CM | POA: Diagnosis not present

## 2019-06-03 DIAGNOSIS — M25562 Pain in left knee: Secondary | ICD-10-CM | POA: Diagnosis not present

## 2019-06-03 DIAGNOSIS — M542 Cervicalgia: Secondary | ICD-10-CM | POA: Insufficient documentation

## 2019-06-03 DIAGNOSIS — R0789 Other chest pain: Secondary | ICD-10-CM | POA: Diagnosis not present

## 2019-06-03 LAB — URINALYSIS, ROUTINE W REFLEX MICROSCOPIC
Bilirubin Urine: NEGATIVE
Glucose, UA: NEGATIVE mg/dL
Hgb urine dipstick: NEGATIVE
Ketones, ur: NEGATIVE mg/dL
Leukocytes,Ua: NEGATIVE
Nitrite: NEGATIVE
Protein, ur: NEGATIVE mg/dL
Specific Gravity, Urine: 1.003 — ABNORMAL LOW (ref 1.005–1.030)
pH: 8 (ref 5.0–8.0)

## 2019-06-03 MED ORDER — ACETAMINOPHEN 500 MG PO TABS: 1000.0000 mg | ORAL_TABLET | Freq: Once | ORAL | Status: DC

## 2019-06-03 MED ORDER — SODIUM CHLORIDE 0.9 % IV BOLUS: 500.0000 mL | Freq: Once | INTRAVENOUS | Status: DC

## 2019-06-03 MED ORDER — FENTANYL CITRATE (PF) 100 MCG/2ML IJ SOLN: 50.0000 ug | Freq: Once | INTRAMUSCULAR | Status: DC

## 2019-06-03 MED ORDER — SODIUM CHLORIDE 0.9 % IV BOLUS: 1000.0000 mL | Freq: Once | INTRAVENOUS | Status: DC

## 2019-06-03 NOTE — ED Notes (Addendum)
Cortni, PA and Dr Roslynn Amble both spoke with pt about risks of leaving AMA. Pt refused to stay stating she wants to go home. Pt was alert and oriented, stating she understands risk.
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
## 2019-06-09 ENCOUNTER — Other Ambulatory Visit: Payer: Medicare HMO

## 2020-07-25 ENCOUNTER — Other Ambulatory Visit: Payer: Self-pay | Admitting: Physician Assistant

## 2020-07-25 DIAGNOSIS — Z1231 Encounter for screening mammogram for malignant neoplasm of breast: Secondary | ICD-10-CM

## 2020-08-16 ENCOUNTER — Other Ambulatory Visit: Payer: Self-pay

## 2020-08-16 ENCOUNTER — Ambulatory Visit
Admission: RE | Admit: 2020-08-16 | Discharge: 2020-08-16 | Disposition: A | Discharge status: Home / Self Care | Payer: Medicare HMO | Source: Ambulatory Visit | Attending: Physician Assistant | Admitting: Physician Assistant

## 2020-08-16 DIAGNOSIS — Z1231 Encounter for screening mammogram for malignant neoplasm of breast: Secondary | ICD-10-CM

## 2021-09-26 ENCOUNTER — Other Ambulatory Visit: Payer: Self-pay | Admitting: Physician Assistant

## 2021-09-26 DIAGNOSIS — Z1231 Encounter for screening mammogram for malignant neoplasm of breast: Secondary | ICD-10-CM

## 2021-10-22 ENCOUNTER — Encounter: Payer: Self-pay | Admitting: Oncology

## 2021-10-29 ENCOUNTER — Inpatient Hospital Stay: Admission: RE | Admit: 2021-10-29 | Payer: Medicare HMO | Source: Ambulatory Visit

## 2021-11-20 ENCOUNTER — Ambulatory Visit: Payer: Medicare Other

## 2023-01-06 ENCOUNTER — Encounter: Payer: Self-pay | Admitting: Oncology

## 2023-01-22 ENCOUNTER — Ambulatory Visit (INDEPENDENT_AMBULATORY_CARE_PROVIDER_SITE_OTHER): Payer: Medicare Other | Admitting: Family

## 2023-01-22 ENCOUNTER — Encounter: Payer: Self-pay | Admitting: Family

## 2023-01-22 VITALS — BP 131/92 | HR 83 | Temp 97.2°F | Ht 60.0 in | Wt 163.4 lb

## 2023-01-22 DIAGNOSIS — M5412 Radiculopathy, cervical region: Secondary | ICD-10-CM | POA: Diagnosis not present

## 2023-01-22 DIAGNOSIS — R7303 Prediabetes: Secondary | ICD-10-CM | POA: Diagnosis not present

## 2023-01-22 MED ORDER — TRULICITY 0.75 MG/0.5ML ~~LOC~~ SOAJ: 1.5000 mg | SUBCUTANEOUS | 0 refills | Status: DC

## 2023-01-22 NOTE — Patient Instructions (Addendum)
Welcome to [facility], It was a pleasure meeting you today!   As discussed, have your pharmacy send [place] a request when you need medicine refills. I have sent in a higher dose of Trulicity to start. This medicine helps you to lose weight which also helps lower your blood sugar.  I also recommend you send a MyChart message for refills to be sure we do not miss it. I have sent a referral to our Sports medicine office to have your neck and arm pain further evaluated.  Please schedule a 2 or 3 week follow up visit for fasting labs today.  Schedule a 3 month follow up visit for a regular follow up with medicine refills.  In regards to your allergies, follow the instructions below: Use Nasal saline spray (i.e.,Simply Saline or other generic) or a nasal saline lavage (i.e.,NeilMed, Neti Pot) to flush allergens, general disinfecting &  prior to using medicated nasal sprays. Use your Flonase nasal spray 1 spray each nostril twice a day for 3-5 days, then reduce to daily use. 2 sprays twice a day is ok for really bad symptoms for 1 week, then reduce to 1 spray twice a day or daily. May add over the counter antihistamines such as Xyzal (levocetirizine), Zyrtec (cetirizine), Claritin (loratadine), or Allegra (fexofenadine) daily as needed. May take twice a day if needed as long as it does not cause drowsiness or too much dryness in nose, mouth or throat. May also use Pataday or generic over the counter eye drops: 1 drop in each eye daily as needed for itchy/watery eyes.      PLEASE NOTE: If you had any LAB tests please let us know if you have not heard back within a few days. You may see your results on MyChart before we have a chance to review them but we will give you a call once they are reviewed by [place]. If we ordered any REFERRALS today, please let us know if you have not heard from their office within the next week.  Let us know through MyChart if you are needing REFILLS, or  have your pharmacy send [place] the request. You can also use MyChart to communicate with me or any office staff.  Please try these tips to maintain a healthy lifestyle: It is important that you exercise regularly at least 30 minutes 5 times a week. Think about what you will eat, plan ahead. Choose whole foods, & think  "clean, green, fresh or frozen" over canned, processed or packaged foods which are more sugary, salty, and fatty. 70 to 75% of food eaten should be fresh vegetables and protein. 2-3  meals daily with healthy snacks between meals, but must be whole fruit, protein or vegetables. Aim to eat over a 10 hour period when you are active, for example, 7am to 5pm, and then STOP after your last meal of the day, drinking only water.  Shorter eating windows, 6-8 hours, are showing benefits in heart disease and blood sugar regulation. Drink water every day! Shoot for 64 ounces daily = 8 cups, no other drink is as healthy! Fruit juice is best enjoyed in a healthy way, by EATING the fruit.

## 2023-01-22 NOTE — Progress Notes (Signed)
New Patient Office Visit  Subjective:  Patient ID: Angela Gross, female    DOB: Feb 19, 1951  Age: 72 y.o. MRN: [id number]  CC:  Chief Complaint  Patient presents with   New Patient (Initial Visit)   Ear Pain    Pt c/o right ear pain that goes down the right arm. Present for a couple of weeks. Has tried tylenol which did not help.     HPI Angela Gross presents for establishing care today.  T2DM: Pt is currently maintained on the following medications for diabetes: Trulicity Denies polyuria/polydipsia/polyphagia Denies hypoglycemia Home glucose readings range: does not check at home. Last A1C was  Lab Results  Component Value Date   HGBA1C 6.0 (H) 12/13/2013   HGBA1C 6.0 03/04/2010   Right neck & arm pain:  reports having pain for long time, reports pain down her neck, and right arm, with tingling and numbness in 4th & 5th fingers, describes as sharp, sometimes dull, achy pain. Takes 1 xtra strength Tylenol bid with little relief.   Assessment & Plan:  Borderline diabetes Assessment & Plan: chronic pt on Trulicity 0.75mg  for months increasing dose to 1.5mg  qweek advised on use & SE to watch for f/u 2 months  Orders: -     Trulicity; Inject 1.5 mg into the skin once a week.  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 Would like to know exactly what is causing her pain. Sending referral to Sports med office.   - Ambulatory referral to Sports Medicine   Subjective:    Outpatient Medications Prior to Visit  Medication Sig Dispense Refill   ACCU-CHEK GUIDE test strip USE TO TEST BLOOD SUGARS ONCE WEEKLY     allopurinol (ZYLOPRIM) 100 MG tablet Take 200 mg by mouth daily.     anastrozole (ARIMIDEX) 1 MG tablet TAKE 1 TABLET BY MOUTH ONCE DAILY 90 tablet 7   atorvastatin (LIPITOR) 20 MG tablet Take 20 mg by  mouth daily.     colchicine 0.6 MG tablet Take by mouth.     denosumab (PROLIA) 60 MG/ML SOSY injection Inject 60 mg into the skin every 6 (six) months.     fluticasone (FLONASE) 50 MCG/ACT nasal spray Place into both nostrils.     furosemide (LASIX) 20 MG tablet Take 20 mg by mouth daily as needed for fluid.     gabapentin (NEURONTIN) 300 MG capsule Take 1 capsule (300 mg total) by mouth at bedtime. (Patient taking differently: Take 300-600 mg by mouth See admin instructions. Take  by mouth twice daily, in the morning and midday, Then take  at bedtime.) 90 capsule 4   leflunomide (ARAVA) 20 MG tablet Take 20 mg by mouth daily.     losartan-hydrochlorothiazide (HYZAAR) 50-12.5 MG tablet Take 1 tablet by mouth every morning.     Upadacitinib ER (RINVOQ) 15 MG TB24 Take by mouth.     diclofenac (VOLTAREN) 75 MG EC tablet Take 75 mg by mouth 2 (two) times daily.     hydroxychloroquine (PLAQUENIL) 200 MG tablet Take 200 mg by mouth 2 (two) times daily.     TRULICITY 0.75 MG/0.5ML SOPN Inject 0.75 mg into the skin once a week.     hydrochlorothiazide (MICROZIDE) 12.5 MG capsule Take 12.5 mg by mouth daily.     sulfamethoxazole-trimethoprim (BACTRIM DS,SEPTRA DS) 800-160 MG tablet  Take 1 tablet by mouth 2 (two) times daily. 14 tablet 0   No facility-administered medications prior to visit.   Past Medical History:  Diagnosis Date   Anxiety    Breast cancer    "right" (01/10/2014)   Chronic left shoulder pain    "work related injury" (01/10/2014)   Constipation 10/04/2014   Degenerative disc disease, cervical    Fibromyalgia    Full dentures    Hot flashes, menopausal 04/04/2014   Hypertension    Osteoarthritis of both knees    Osteoporosis    Personal history of chemotherapy    PONV (postoperative nausea and vomiting)    Raynaud disease    Rheumatoid arthritis    "elbows; fingers; toes"   Syncope and collapse    "4 times in the last year" (01/10/2014)   Wears glasses    Past  Surgical History:  Procedure Laterality Date   BREAST BIOPSY Right 10/2013   BREAST IMPLANT EXCHANGE Right 02/12/2016   Procedure: EXCHANGE SILICONE IMPLANT ON RIGHT, CAPSULORRAPHY AND PLACEMENT OF ALLODERM;  Surgeon: Glenna Fellows, MD;  Location: [facility];  Service: Plastics;  Laterality: Right;   BREAST RECONSTRUCTION Right 12/12/2014   Procedure: REVISION OF RIGHT RECONSTRUCTIVE BREAST WITH CAPSULORRHAPHY PLACEMENT OF SILCONE IMPLANTS AND LIPOFILLING TO RIGHT CHEST ;  Surgeon: Glenna Fellows, MD;  Location: [facility];  Service: Plastics;  Laterality: Right;   BREAST RECONSTRUCTION Right 03/06/2015   Procedure: RIGHT NIPPLE AEROLA COMPLEX RECONSTRUCTION AND LOCAL FLAP WITH LIPO FILLING TO RIGHT BREAST,REMOVAL RIGHT CHEST PORT;  Surgeon: Glenna Fellows, MD;  Location: [facility];  Service: Plastics;  Laterality: Right;   BREAST RECONSTRUCTION Right 05/18/2018   Procedure: revision right breast reconstruction with silicone implant exchange;  Surgeon: Glenna Fellows, MD;  Location: [facility];  Service: Plastics;  Laterality: Right;   BREAST RECONSTRUCTION WITH PLACEMENT OF TISSUE EXPANDER AND FLEX HD (ACELLULAR HYDRATED DERMIS) Right 12/13/2013   Procedure: RIGHT BREAST RECONSTRUCTION WITH PLACEMENT OF TISSUE EXPANDER AND FLEX HD (ACELLULAR HYDRATED DERMIS);  Surgeon: Glenna Fellows, MD;  Location: [facility];  Service: Plastics;  Laterality: Right;   CHOLECYSTECTOMY  1970's   COLONOSCOPY     LIPOMA EXCISION Bilateral 12/13/2013   Procedure: EXCISION OF LEFT BREAST KELOID AND RIGHT CHEST KELOID;  Surgeon: Glenna Fellows, MD;  Location: [facility];  Service: Plastics;  Laterality: Bilateral;   LIPOSUCTION WITH LIPOFILLING Right 09/26/2014   Procedure: LIPOSUCTION WITH LIPOFILLING;  Surgeon: Glenna Fellows, MD;  Location: [facility];  Service: Plastics;  Laterality: Right;   LIPOSUCTION  WITH LIPOFILLING Right 12/12/2014   Procedure: LIPOSUCTION WITH LIPOFILLING;  Surgeon: Glenna Fellows, MD;  Location: [facility];  Service: Plastics;  Laterality: Right;   LIPOSUCTION WITH LIPOFILLING Right 03/06/2015   Procedure: ABDOMINAL LIPOSUCTION WITH LIPOFILLING TO RIGHT BREAST;  Surgeon: Glenna Fellows, MD;  Location: [facility];  Service: Plastics;  Laterality: Right;   MASTECTOMY Right 2015   MASTECTOMY W/ SENTINEL NODE BIOPSY Right 12/13/2013   Procedure: RIGHT TOTAL MASTECTOMY WITH SENTINEL LYMPH NODE BIOPSY;  Surgeon: Ernestene Mention, MD;  Location: [facility];  Service: General;  Laterality: Right;   MASTOPEXY Left 09/26/2014   Procedure: MASTOPEXY;  Surgeon: Glenna Fellows, MD;  Location: [facility];  Service: Plastics;  Laterality: Left;   PORT-A-CATH REMOVAL Right 03/06/2015   Procedure: REMOVAL PORT-A-CATH;  Surgeon: Glenna Fellows, MD;  Location: [facility];  Service: Government social research officerlastics;  Laterality: Right;   PORTACATH PLACEMENT Right 12/13/2013   Procedure: INSERTION PORT-A-CATH;  Surgeon: Ernestene MentionHaywood M Ingram, MD;  Location: [facility];  Service: General;  Laterality: Right;   REDUCTION MAMMAPLASTY Left 2015   REMOVAL OF BILATERAL TISSUE EXPANDERS WITH PLACEMENT OF BILATERAL BREAST IMPLANTS Bilateral 09/26/2014   Procedure: REMOVAL OF RIGHT TISSUE EXPANDER WITH PLACEMENT OF RIGHT BREAST IMPLANT LIPOFILLING TO RIGHT BREAST/LEFT BREAST MASTOPEXY FOR SYMMETRY;  Surgeon: Glenna FellowsBrinda Thimmappa, MD;  Location: [facility];  Service: Plastics;  Laterality: Bilateral;   TISSUE EXPANDER PLACEMENT Right 01/10/2014   Procedure: Incision and Drainage Right Breast Seroma with TISSUE EXPANDER exchange;  Surgeon: Glenna FellowsBrinda Thimmappa, MD;  Location: [facility];  Service: Plastics;  Laterality: Right;   VAGINAL HYSTERECTOMY  1980   no salpingo-oophoretomu    Objective:   Today's Vitals: BP (!) 131/92 (BP  Location: Left Arm, Patient Position: Sitting, Cuff Size: Large)   Pulse 83   Temp (!) 97.2 F (36.2 C) (Temporal)   Ht 5' (1.524 m)   Wt 163 lb 6.4 oz (74.1 kg)   SpO2 98%   BMI 31.91 kg/m   Physical Exam Vitals and nursing note reviewed.  Constitutional:      Appearance: Normal appearance.  Cardiovascular:     Rate and Rhythm: Normal rate and regular rhythm.  Pulmonary:     Effort: Pulmonary effort is normal.     Breath sounds: Normal breath sounds.  Musculoskeletal:        General: Normal range of motion.  Skin:    General: Skin is warm and dry.  Neurological:     Mental Status: She is alert.  Psychiatric:        Mood and Affect: Mood normal.        Behavior: Behavior normal.     Meds ordered this encounter  Medications   TRULICITY 0.75 MG/0.5ML SOPN    Sig: Inject 1.5 mg into the skin once a week.    Dispense:  8 mL    Refill:  0    Order Specific Question:   Supervising Provider    Answer:   ANDY, CAMILLE L [[id number]]    Dulce SellarHudnell, Timberly Yott, NP

## 2023-01-25 ENCOUNTER — Encounter: Payer: Self-pay | Admitting: Family

## 2023-01-25 DIAGNOSIS — R7303 Prediabetes: Secondary | ICD-10-CM | POA: Insufficient documentation

## 2023-01-25 MED ORDER — TRULICITY 1.5 MG/0.5ML ~~LOC~~ SOAJ: 1.5000 mg | SUBCUTANEOUS | 2 refills | Status: DC

## 2023-01-25 NOTE — Assessment & Plan Note (Signed)
chronic pt on Trulicity 0.75mg  for months increasing dose to 1.5mg  qweek advised on use & SE to watch for f/u 2 months

## 2023-02-03 ENCOUNTER — Other Ambulatory Visit (INDEPENDENT_AMBULATORY_CARE_PROVIDER_SITE_OTHER): Payer: Medicare Other

## 2023-02-03 DIAGNOSIS — R7303 Prediabetes: Secondary | ICD-10-CM | POA: Diagnosis not present

## 2023-02-03 NOTE — Addendum Note (Signed)
Addended by: Laddie Aquas A on: 02/03/2023 09:02 AM   Modules accepted: Orders

## 2023-02-04 ENCOUNTER — Ambulatory Visit: Payer: Medicare Other | Admitting: Sports Medicine

## 2023-02-04 ENCOUNTER — Telehealth: Payer: Self-pay

## 2023-02-04 ENCOUNTER — Other Ambulatory Visit: Payer: Self-pay

## 2023-02-04 VITALS — BP 110/80 | Ht 60.0 in | Wt 162.0 lb

## 2023-02-04 DIAGNOSIS — M79601 Pain in right arm: Secondary | ICD-10-CM | POA: Diagnosis not present

## 2023-02-04 LAB — TSH: TSH: 1.47 mIU/L (ref 0.40–4.50)

## 2023-02-04 LAB — HEMOGLOBIN A1C
Hgb A1c MFr Bld: 6.3 % of total Hgb — ABNORMAL HIGH (ref <5.7)
Mean Plasma Glucose: 134 mg/dL
eAG (mmol/L): 7.4 mmol/L

## 2023-02-04 LAB — CBC WITH DIFFERENTIAL/PLATELET
Absolute Monocytes: 799 cells/uL (ref 200–950)
Basophils Absolute: 20 cells/uL (ref 0–200)
Basophils Relative: 0.3 %
Eosinophils Absolute: 73 cells/uL (ref 15–500)
Eosinophils Relative: 1.1 %
HCT: 39.1 % (ref 35.0–45.0)
Hemoglobin: 12.9 g/dL (ref 11.7–15.5)
Lymphs Abs: 2363 cells/uL (ref 850–3900)
MCH: 29.1 pg (ref 27.0–33.0)
MCHC: 33 g/dL (ref 32.0–36.0)
MCV: 88.1 fL (ref 80.0–100.0)
MPV: 12.7 fL — ABNORMAL HIGH (ref 7.5–12.5)
Monocytes Relative: 12.1 %
Neutro Abs: 3346 cells/uL (ref 1500–7800)
Neutrophils Relative %: 50.7 %
Platelets: 138 10*3/uL — ABNORMAL LOW (ref 140–400)
RBC: 4.44 10*6/uL (ref 3.80–5.10)
RDW: 14.6 % (ref 11.0–15.0)
Total Lymphocyte: 35.8 %
WBC: 6.6 10*3/uL (ref 3.8–10.8)

## 2023-02-04 LAB — COMPREHENSIVE METABOLIC PANEL
AG Ratio: 1.9 (calc) (ref 1.0–2.5)
ALT: 26 U/L (ref 6–29)
AST: 38 U/L — ABNORMAL HIGH (ref 10–35)
Albumin: 4.2 g/dL (ref 3.6–5.1)
Alkaline phosphatase (APISO): 51 U/L (ref 37–153)
BUN/Creatinine Ratio: 16 (calc) (ref 6–22)
BUN: 23 mg/dL (ref 7–25)
CO2: 25 mmol/L (ref 20–32)
Calcium: 9.9 mg/dL (ref 8.6–10.4)
Chloride: 103 mmol/L (ref 98–110)
Creat: 1.43 mg/dL — ABNORMAL HIGH (ref 0.50–1.05)
Globulin: 2.2 g/dL (calc) (ref 1.9–3.7)
Glucose, Bld: 106 mg/dL — ABNORMAL HIGH (ref 65–99)
Potassium: 3.7 mmol/L (ref 3.5–5.3)
Sodium: 142 mmol/L (ref 135–146)
Total Bilirubin: 0.3 mg/dL (ref 0.2–1.2)
Total Protein: 6.4 g/dL (ref 6.1–8.1)

## 2023-02-04 LAB — LIPID PANEL
Cholesterol: 175 mg/dL (ref <200)
HDL: 35 mg/dL — ABNORMAL LOW (ref >=50)
LDL Cholesterol (Calc): 96 mg/dL (calc)
Non-HDL Cholesterol (Calc): 140 mg/dL (calc) — ABNORMAL HIGH (ref <130)
Total CHOL/HDL Ratio: 5 (calc) — ABNORMAL HIGH (ref <5.0)
Triglycerides: 329 mg/dL — ABNORMAL HIGH (ref <150)

## 2023-02-04 MED ORDER — METHYLPREDNISOLONE ACETATE 40 MG/ML IJ SUSP
40.0000 mg | Freq: Once | INTRAMUSCULAR | Status: AC
  Administered 2023-02-04: 40 mg via INTRA_ARTICULAR

## 2023-02-04 NOTE — Telephone Encounter (Signed)
-----   Message from Candie Chroman, [place] sent at 02/03/2023  4:58 PM EDT ----- Do you know about this ? ----- Message ----- From: Dulce Sellar, NP Sent: 02/03/2023   3:04 PM EDT To: Team Hudnell  How & why did labs get done for this patient today? I didn't order and I don't see any message for her requesting labs be done?

## 2023-02-04 NOTE — Telephone Encounter (Signed)
Front desk did not schedule patient correctly. AVS stated to make OV with fasting labs. Front desk scheduled patient for labs only. Patient was very upset that the front desk did not schedule her correctly. To help calm patient down, I drew patient while she was here and advised to schedule office visit.

## 2023-02-04 NOTE — Progress Notes (Signed)
Angela Gross - 72 y.o. female MRN [id number]  Date of birth: [birth year]    CHIEF COMPLAINT:   R Shoulder pain    SUBJECTIVE:   HPI:  Pleasant 72 year old female comes to clinic to be evaluated for right shoulder pain.  She has had right shoulder pain for a number of years now.  No inciting injury or trauma.  She describes a sharp stabbing pain over the lateral aspect of the shoulder.  Is made worse with overhead movements, such as trying to brush her hair.  Little bit of pain radiates to the front of the arm but no numbness or tingling down the arm.  She has tried taking Tylenol for this.  She is not able to take NSAIDs due to her kidney function.  She has never had x-rays or evaluation of this before.  ROS:     See HPI  PERTINENT  PMH / PSH FH / / SH:  Past Medical, Surgical, Social, and Family History Reviewed & Updated in the EMR.  Pertinent findings include:  none  OBJECTIVE: BP 110/80   Ht 5' (1.524 m)   Wt 162 lb (73.5 kg)   BMI 31.64 kg/m   Physical Exam:  Vital signs are reviewed.  GEN: Alert and oriented, NAD Pulm: Breathing unlabored PSY: normal mood, congruent affect  MSK: Right shoulder -no obvious deformity.  Nontender to palpation of the [facility] joint.  She is tender to palpation of the biceps tendon in the bicipital groove.  Active range of motion forward flexion to 120 degrees, abduction to 120 degrees with positive painful arc.  External rotation to 45 degrees, internal rotation to low lumbar spine.  4/5 strength with resisted internal and external rotation.  Positive Hawkins test.  Positive indican test.  Negative speeds.  ULTRASOUND: Shoulder, right Diagnostic complete ultrasound imaging obtained of patient's right shoulder.  - No obvious evidence of bony deformity or osteophyte development appreciated.  - Long head of the biceps tendon: The tendon is sitting at the lateral aspect of the groove.  It is visibly in palpably subluxing with external rotation.  There  is a fair amount of edema with a positive bullseye sign.  There is also some hypoechoic changes best seen in the long axis as well. - Subscapularis tendon: complete visualization across the width of the insertion point yielded no evidence of tendon thickening, calcification, or tears in the long axis view.  - Supraspinatus tendon: complete visualization across the width of the insertion point yielded evidence of micro calcification, with small tears and underlying cortical irregularities in the long axis view.  There is evidence of bursal inflammation and swelling appreciated.  - Infraspinatus and teres minor tendons: visualization across the width of the insertion points yielded no evidence of tendon thickening, calcification, or tears in the long axis view.  [facility] Joint: No evidence of joint separation, collapse, or osteophyte development appreciated. No effusion present.   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 2.  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 [name] on 02/04/2023  ASSESSMENT & PLAN:  1. R Subacromial Bursitis 2. R Biceps Tendonitis  -History, exam, ultrasound findings are all consistent with R cuff tendinitis/biceps tendinitis/subacromial bursitis.  I do not see any major full-thickness tears on her ultrasound today.  I recommended we treat this conservatively with a subacromial corticosteroid injection and  some home exercises and she is agreeable to this.  See procedure note for details.  Left follow-up in 4 to 6 weeks to check progress.  If no improvement, I would obtain an x-ray to evaluate the integrity of her humeral joint to see if there is intra-articular arthritis.  Consideration of an MRI.  However, she is not interested in surgery.  Procedure performed:  Subacromial Corticosteroid Injection;  palpation guided  Consent obtained and verified. Time-out conducted. Noted no overlying erythema, induration, or other signs of local infection. The right posterior subacromial space was palpated and marked. The overlying skin was prepped in a sterile fashion. Topical analgesic spray: Ethyl chloride. Needle: 25 gauge, 1.5 inch Meds: 4 cc 1% lidocaine without epinephrine, 40 mg depo-medrol Completed without difficulty.  Advised to call if fevers/chills, erythema, induration, drainage, or persistent bleeding.   Arvella Nigh, MD PGY-4, Sports Medicine Fellow [facility]  Addendum:  I was the preceptor for this visit and available for immediate consultation.  [name] MD Marrianne Mood

## 2023-02-10 NOTE — Progress Notes (Signed)
Please print these labs for me to show pt at her visit tomorrow, thx

## 2023-02-11 ENCOUNTER — Ambulatory Visit (INDEPENDENT_AMBULATORY_CARE_PROVIDER_SITE_OTHER): Payer: Medicare Other | Admitting: Family

## 2023-02-11 VITALS — BP 116/79 | HR 92 | Temp 98.0°F | Ht 60.0 in | Wt 162.4 lb

## 2023-02-11 DIAGNOSIS — Z0000 Encounter for general adult medical examination without abnormal findings: Secondary | ICD-10-CM | POA: Diagnosis not present

## 2023-02-11 DIAGNOSIS — Z780 Asymptomatic menopausal state: Secondary | ICD-10-CM

## 2023-02-11 DIAGNOSIS — M1A0790 Idiopathic chronic gout, unspecified ankle and foot, without tophus (tophi): Secondary | ICD-10-CM | POA: Diagnosis not present

## 2023-02-11 DIAGNOSIS — N1832 Chronic kidney disease, stage 3b: Secondary | ICD-10-CM | POA: Diagnosis not present

## 2023-02-11 DIAGNOSIS — Z1211 Encounter for screening for malignant neoplasm of colon: Secondary | ICD-10-CM

## 2023-02-11 DIAGNOSIS — R7303 Prediabetes: Secondary | ICD-10-CM

## 2023-02-11 MED ORDER — FEBUXOSTAT 40 MG PO TABS
40.0000 mg | ORAL_TABLET | Freq: Every day | ORAL | 1 refills | Status: DC
Start: 2023-02-11 — End: 2023-10-05

## 2023-02-11 NOTE — Patient Instructions (Addendum)
It was very nice to see you today!   Stop the Gabapentin and the Allopurinol medications. I am sending generic Uloric to replace the Allopurinol for your gout. Be sure to [facility] AT LEAST 8 cups of water daily!  You can try Turmeric (Curcumin) with Bioperine (500mg  twice a day) which is an anti-inflammatory to help with arthritis pain/stiffness. Look for a 3rd party seal from [employer], [employer] or [employer]. This authenticates the quality but not the efficacy (since not FDA approved).   Start the higher dose of Trulicity and this should help your sugar and triglycerides come down as well as exercise.  Schedule a  3 month follow up visit today.    PLEASE NOTE:  If you had any lab tests please let us know if you have not heard back within a few days. You may see your results on MyChart before we have a chance to review them but we will give you a call once they are reviewed by [place]. If we ordered any referrals today, please let us know if you have not heard from their office within the next week.

## 2023-02-11 NOTE — Assessment & Plan Note (Addendum)
chronic A1C 6.3 Trulicity increased to 1.5mg  qweek about 2-3w ago, pt has not started as she is finishing up her 0.75mg  syringes. advised on use & SE to watch for advised on low carb, no sweets diet, increase exercise as able f/u 3 months

## 2023-02-11 NOTE — Assessment & Plan Note (Signed)
chronic - creatinine 1.4, GFR 40 switching from allopurinol to uloric, advised to stop Gabapentin, states it does not help her sx other meds ok for now advised to avoid NSAIDs, Tylenol ok for pain/fever will continue to monitor

## 2023-02-11 NOTE — Assessment & Plan Note (Signed)
chronic reports taking Allopurinol 200mg  qd & Colchicine for flares reports last flare about 2 mos ago advised on foods to avoid, eat yogurt or drink a little cherry juice as soon as sx start  d/t kidney fx, switching allopurinol to uloric, pt to let me know if too expensive f/u 3 mos

## 2023-02-11 NOTE — Progress Notes (Signed)
Phone [phone]  Subjective:   Patient is a 72 y.o. female presenting for annual physical.    Chief Complaint  Patient presents with   Borderline diabetes    Follow up   HPI: Prediabetes:  hx of having for years, last A1C in EMR 6.0, most recent 6.3. Pt has been on Trulicity 0.75mg , recently increased to 1.5mg  qweek. Pt does not check CBGs at home.  Gout:  has had for years, takes 200mg  Allopurinol qd for prevention and Colchicine for flare ups. Reports last flare about 2 mos ago.  See problem oriented charting- ROS- full  review of systems was completed and negative except for: prediabetes, gout noted in HPI above.  The following were reviewed and entered/updated in epic: Past Medical History:  Diagnosis Date   Anxiety    Breast cancer (HCC)    "right" (01/10/2014)   Chronic left shoulder pain    "work related injury" (01/10/2014)   Constipation 10/04/2014   Degenerative disc disease, cervical    Fibromyalgia    Full dentures    Hot flashes, menopausal 04/04/2014   Hypertension    Osteoarthritis of both knees    Osteoporosis    Personal history of chemotherapy    PONV (postoperative nausea and vomiting)    Raynaud disease    Rheumatoid arthritis (HCC)    "elbows; fingers; toes"   Syncope and collapse    "4 times in the last year" (01/10/2014)   Wears glasses    Patient Active Problem List   Diagnosis Date Noted   Chronic gout of foot 02/11/2023   Chronic kidney disease, stage 3b (HCC) 02/11/2023   Borderline diabetes 01/25/2023   Osteoporosis 02/27/2015   Personal history of malignant neoplasm of breast 09/26/2014   Arthralgia of multiple sites 06/27/2014   Chemotherapy induced cardiomyopathy (HCC) 05/11/2014   Chemotherapy-induced neuropathy (HCC) 03/14/2014   Anxiety 01/30/2014   Malignant neoplasm of lower-inner quadrant of right breast of female, estrogen receptor positive (HCC) 10/25/2013   Past Surgical History:  Procedure Laterality Date   BREAST  BIOPSY Right 10/2013   BREAST IMPLANT EXCHANGE Right 02/12/2016   Procedure: EXCHANGE SILICONE IMPLANT ON RIGHT, CAPSULORRAPHY AND PLACEMENT OF ALLODERM;  Surgeon: Glenna Fellows, MD;  Location: [facility];  Service: Plastics;  Laterality: Right;   BREAST RECONSTRUCTION Right 12/12/2014   Procedure: REVISION OF RIGHT RECONSTRUCTIVE BREAST WITH CAPSULORRHAPHY PLACEMENT OF SILCONE IMPLANTS AND LIPOFILLING TO RIGHT CHEST ;  Surgeon: Glenna Fellows, MD;  Location: [facility];  Service: Plastics;  Laterality: Right;   BREAST RECONSTRUCTION Right 03/06/2015   Procedure: RIGHT NIPPLE AEROLA COMPLEX RECONSTRUCTION AND LOCAL FLAP WITH LIPO FILLING TO RIGHT BREAST,REMOVAL RIGHT CHEST PORT;  Surgeon: Glenna Fellows, MD;  Location: [facility];  Service: Plastics;  Laterality: Right;   BREAST RECONSTRUCTION Right 05/18/2018   Procedure: revision right breast reconstruction with silicone implant exchange;  Surgeon: Glenna Fellows, MD;  Location: [facility];  Service: Plastics;  Laterality: Right;   BREAST RECONSTRUCTION WITH PLACEMENT OF TISSUE EXPANDER AND FLEX HD (ACELLULAR HYDRATED DERMIS) Right 12/13/2013   Procedure: RIGHT BREAST RECONSTRUCTION WITH PLACEMENT OF TISSUE EXPANDER AND FLEX HD (ACELLULAR HYDRATED DERMIS);  Surgeon: Glenna Fellows, MD;  Location: [facility];  Service: Plastics;  Laterality: Right;   CHOLECYSTECTOMY  1970's   COLONOSCOPY     LIPOMA EXCISION Bilateral 12/13/2013   Procedure: EXCISION OF LEFT BREAST KELOID AND RIGHT CHEST KELOID;  Surgeon: Glenna Fellows, MD;  Location: [facility];  Service: Plastics;  Laterality: Bilateral;   LIPOSUCTION WITH LIPOFILLING Right 09/26/2014   Procedure: LIPOSUCTION WITH LIPOFILLING;  Surgeon: Glenna Fellows, MD;  Location: [facility];  Service: Plastics;  Laterality: Right;   LIPOSUCTION WITH LIPOFILLING Right 12/12/2014   Procedure: LIPOSUCTION  WITH LIPOFILLING;  Surgeon: Glenna Fellows, MD;  Location: [facility];  Service: Plastics;  Laterality: Right;   LIPOSUCTION WITH LIPOFILLING Right 03/06/2015   Procedure: ABDOMINAL LIPOSUCTION WITH LIPOFILLING TO RIGHT BREAST;  Surgeon: Glenna Fellows, MD;  Location: [facility];  Service: Plastics;  Laterality: Right;   MASTECTOMY Right 2015   MASTECTOMY W/ SENTINEL NODE BIOPSY Right 12/13/2013   Procedure: RIGHT TOTAL MASTECTOMY WITH SENTINEL LYMPH NODE BIOPSY;  Surgeon: Ernestene Mention, MD;  Location: [facility];  Service: General;  Laterality: Right;   MASTOPEXY Left 09/26/2014   Procedure: MASTOPEXY;  Surgeon: Glenna Fellows, MD;  Location: [facility];  Service: Plastics;  Laterality: Left;   PORT-A-CATH REMOVAL Right 03/06/2015   Procedure: REMOVAL PORT-A-CATH;  Surgeon: Glenna Fellows, MD;  Location: [facility];  Service: Plastics;  Laterality: Right;   PORTACATH PLACEMENT Right 12/13/2013   Procedure: INSERTION PORT-A-CATH;  Surgeon: Ernestene Mention, MD;  Location: [facility];  Service: General;  Laterality: Right;   REDUCTION MAMMAPLASTY Left 2015   REMOVAL OF BILATERAL TISSUE EXPANDERS WITH PLACEMENT OF BILATERAL BREAST IMPLANTS Bilateral 09/26/2014   Procedure: REMOVAL OF RIGHT TISSUE EXPANDER WITH PLACEMENT OF RIGHT BREAST IMPLANT LIPOFILLING TO RIGHT BREAST/LEFT BREAST MASTOPEXY FOR SYMMETRY;  Surgeon: Glenna Fellows, MD;  Location: [facility];  Service: Plastics;  Laterality: Bilateral;   TISSUE EXPANDER PLACEMENT Right 01/10/2014   Procedure: Incision and Drainage Right Breast Seroma with TISSUE EXPANDER exchange;  Surgeon: Glenna Fellows, MD;  Location: [facility];  Service: Plastics;  Laterality: Right;   VAGINAL HYSTERECTOMY  1980   no salpingo-oophoretomu    Family History  Problem Relation Age of Onset   Heart disease Mother    Hypertension Mother    Breast cancer  Mother    Diabetes Mother    Congestive Heart Failure Mother    Glaucoma Mother    Breast cancer Sister     Medications- reviewed and updated Current Outpatient Medications  Medication Sig Dispense Refill   ACCU-CHEK GUIDE test strip USE TO TEST BLOOD SUGARS ONCE WEEKLY     atorvastatin (LIPITOR) 20 MG tablet Take 20 mg by mouth daily.     colchicine 0.6 MG tablet Take by mouth.     Dulaglutide (TRULICITY) 1.5 MG/0.5ML SOPN Inject 1.5 mg into the skin once a week. 2 mL 2   febuxostat (ULORIC) 40 MG tablet Take 1 tablet (40 mg total) by mouth daily. 90 tablet 1   fluticasone (FLONASE) 50 MCG/ACT nasal spray Place into both nostrils.     furosemide (LASIX) 20 MG tablet Take 20 mg by mouth daily as needed for fluid.     leflunomide (ARAVA) 20 MG tablet Take 20 mg by mouth daily.     losartan-hydrochlorothiazide (HYZAAR) 50-12.5 MG tablet Take 1 tablet by mouth every morning.     Upadacitinib ER (RINVOQ) 15 MG TB24 Take by mouth.     No current facility-administered medications for this visit.    Allergies-reviewed and updated Allergies  Allergen Reactions   Codeine Hives and Other (See Comments)    Altered mental status, numbness    Diazepam Other (See Comments)  Delusions, hallucinations     Social History   Social History Narrative   Lives at home with husband.   Right-handed.   No caffeine use.    Objective:  BP 116/79   Pulse 92   Temp 98 F (36.7 C) (Temporal)   Ht 5' (1.524 m)   Wt 162 lb 6 oz (73.7 kg)   SpO2 96%   BMI 31.71 kg/m  Physical Exam Vitals and nursing note reviewed.  Constitutional:      Appearance: Normal appearance. She is obese.  HENT:     Head: Normocephalic.     Right Ear: Tympanic membrane normal.     Left Ear: Tympanic membrane normal.     Nose: Nose normal.     Mouth/Throat:     Mouth: Mucous membranes are moist.  Eyes:     Pupils: Pupils are equal, round, and reactive to light.  Cardiovascular:     Rate and Rhythm: Normal  rate and regular rhythm.  Pulmonary:     Effort: Pulmonary effort is normal.     Breath sounds: Normal breath sounds.  Musculoskeletal:        General: Normal range of motion.     Cervical back: Normal range of motion.  Lymphadenopathy:     Cervical: No cervical adenopathy.  Skin:    General: Skin is warm and dry.  Neurological:     Mental Status: She is alert.  Psychiatric:        Mood and Affect: Mood normal.        Behavior: Behavior normal.      Assessment and Plan   Health Maintenance counseling: 1. Anticipatory guidance: Patient counseled regarding regular dental exams q6 months, eye exams,  avoiding smoking and second hand smoke, limiting alcohol to 1 beverage per day, no illicit drugs.   2. Risk factor reduction:  Advised patient of need for regular exercise and diet rich with fruits and vegetables to reduce risk of heart attack and stroke. Exercise- none.  Wt Readings from Last 3 Encounters:  02/11/23 162 lb 6 oz (73.7 kg)  02/04/23 162 lb (73.5 kg)  01/22/23 163 lb 6.4 oz (74.1 kg)   3. Immunizations/screenings/ancillary studies Immunization History  Administered Date(s) Administered   Influenza Inj Mdck Quad With Preservative 06/22/2017   Influenza,inj,Quad PF,6+ Mos 09/12/2015   Influenza-Unspecified 06/13/2013   Pneumococcal Polysaccharide-23 01/02/2017   Health Maintenance Due  Topic Date Due   COVID-19 Vaccine (1) Never done   FOOT EXAM  Never done   OPHTHALMOLOGY EXAM  Never done   Diabetic kidney evaluation - Urine ACR  Never done   DTaP/Tdap/Td (1 - Tdap) Never done   Zoster Vaccines- Shingrix (1 of 2) Never done   COLONOSCOPY (Pts 45-66yrs Insurance coverage will need to be confirmed)  Never done   Pneumonia Vaccine 77+ Years old (2 of 2 - PCV) 06/01/2018   Medicare Annual Wellness (AWV)  03/05/2019   MAMMOGRAM  08/16/2022    4. Cervical cancer screening- hysterectomy 1980 5. Breast cancer screening-  mammogram 2023 6. Colon cancer screening -  sending cologuard order today 7. Skin cancer screening- advised regular sunscreen use. Denies worrisome, changing, or new skin lesions.  8. Birth control/STD check- N/A 9. Osteoporosis screening- last done 2019, hx of Prolia d/t chemotherapy, sending new order today 10. Alcohol screening: none 11. Smoking associated screening (lung cancer screening, AAA screen 65-75, UA)- non- smoker  Annual physical exam - labs done ahead of time, reviewed in detail. High  Triglycerides, A1C up to 6.3, low GFR, advised pt on tx plan.  Borderline diabetes Assessment & Plan: chronic A1C 6.3 Trulicity increased to 1.5mg  qweek about 2-3w ago, pt has not started as she is finishing up her 0.75mg  syringes. advised on use & SE to watch for advised on low carb, no sweets diet, increase exercise as able f/u 3 months   Screening for colon cancer -     Cologuard  Postmenopausal estrogen deficiency -     DG Bone Density; Future  Chronic gout of foot, unspecified cause, unspecified laterality Assessment & Plan: chronic reports taking Allopurinol 200mg  qd & Colchicine for flares reports last flare about 2 mos ago advised on foods to avoid, eat yogurt or drink a little cherry juice as soon as sx start  d/t kidney fx, switching allopurinol to uloric, pt to let me know if too expensive f/u 3 mos  Orders: -     Febuxostat; Take 1 tablet (40 mg total) by mouth daily.  Dispense: 90 tablet; Refill: 1  Chronic kidney disease, stage 3b (HCC) Assessment & Plan: chronic - creatinine 1.4, GFR 40 switching from allopurinol to uloric, advised to stop Gabapentin, states it does not help her sx other meds ok for now advised to avoid NSAIDs, Tylenol ok for pain/fever will continue to monitor   Recommended follow up:  No follow-ups on file. Future Appointments  Date Time Provider Department Center  03/04/2023  3:00 PM Rafoth, Baldemar Friday, MD [facility]  04/27/2023  1:20 PM Dulce Sellar, NP [facility]   05/14/2023  1:20 PM Dulce Sellar, NP [facility]    Lab/Order associations: fasting   Dulce Sellar, NP

## 2023-02-16 ENCOUNTER — Telehealth: Payer: Self-pay | Admitting: Family

## 2023-02-16 NOTE — Telephone Encounter (Signed)
Contacted Angela Gross to schedule their annual wellness visit. Appointment made for 02/26/2023.  Gabriel Cirri [facility] AWV TEAM Direct Dial [phone]

## 2023-03-04 ENCOUNTER — Ambulatory Visit
Admission: RE | Admit: 2023-03-04 | Discharge: 2023-03-04 | Disposition: A | Discharge status: Home / Self Care | Payer: Medicare Other | Source: Ambulatory Visit | Attending: Family Medicine | Admitting: Family Medicine

## 2023-03-04 ENCOUNTER — Ambulatory Visit: Payer: Medicare Other | Admitting: Sports Medicine

## 2023-03-04 VITALS — BP 100/60 | Ht 60.0 in | Wt 162.0 lb

## 2023-03-04 DIAGNOSIS — M25511 Pain in right shoulder: Secondary | ICD-10-CM

## 2023-03-04 DIAGNOSIS — M79601 Pain in right arm: Secondary | ICD-10-CM | POA: Diagnosis not present

## 2023-03-04 NOTE — Progress Notes (Unsigned)
  Angela Gross - 72 y.o. female MRN [id number]  Date of birth: [birth year]    CHIEF COMPLAINT:   R Shoulder Pain    SUBJECTIVE:   HPI:  Pleasant 72 year old female comes to clinic to follow-up right shoulder pain.  She is not doing much better.  She still reports a constant throbbing pain over the lateral aspect of the shoulder that is worse when she lays on it at night.  It also hurts with overhead movements.  She is right-hand dominant uses her hand a lot.  She says the injection at the last visit gave her pain relief for about 2 weeks but the pain then returned.  She is also been doing home exercises not noticing any improvement.  She would like to discuss neck steps today.  ROS:     See HPI  PERTINENT  PMH / PSH FH / / SH:  Past Medical, Surgical, Social, and Family History Reviewed & Updated in the EMR.  Pertinent findings include:  none  OBJECTIVE: BP 100/60   Ht 5' (1.524 m)   Wt 162 lb (73.5 kg)   BMI 31.64 kg/m   Physical Exam:  Vital signs are reviewed.  GEN: Alert and oriented, NAD Pulm: Breathing unlabored PSY: normal Gross, congruent affect  MSK: Right shoulder- no obvious deformity.  She tender palpation of the AC joint and the biceps tendon bicipital groove.  Active range of motion abduction to 150 degrees, forward flexion 120 degrees, external rotation to 60 degrees, rotation to low lumbar spine.  4/5 strength with resisted internal and external rotation.  4/5 strength with supraspinatus testing.  Positive Hawkins test.  Positive empty can test.  Positive speeds test.  ASSESSMENT & PLAN:  1.  Right shoulder pain  -This is a persistent chronic problem has not gotten better with conservative measurements such as home exercises and corticosteroid injection.  Patient benefit from an x-ray and MRI to better evaluate her shoulder anatomy.  She may have a component of underlying arthritis but I also mention she has some chronic degenerative cuff pathology.  Depending on  these findings she may or may not be a candidate for reverse total shoulder arthroplasty.  I set the table for this and discussed whether or not the patient be interested in surgery for the shoulder.  She was a little hesitant at first but she realizes that if she wants the shoulder totally feel better she would be open to surgery if that is what is needed.  Further plan of action will depend on x-rays and MRIs.  If she does have any pathology that would benefit from a reverse total shoulder and refer her to Dr. Everardo Pacific across the street.  Angela Nigh, MD PGY-4, Sports Medicine [facility]   Addendum:  I was the preceptor for this visit and available for immediate consultation.  [name] MD Angela Gross

## 2023-03-16 ENCOUNTER — Ambulatory Visit (INDEPENDENT_AMBULATORY_CARE_PROVIDER_SITE_OTHER): Payer: Medicare Other | Admitting: Family Medicine

## 2023-03-16 ENCOUNTER — Encounter: Payer: Self-pay | Admitting: Family Medicine

## 2023-03-16 VITALS — BP 132/86 | HR 101 | Temp 97.8°F | Resp 17 | Ht 60.0 in | Wt 158.1 lb

## 2023-03-16 DIAGNOSIS — H1013 Acute atopic conjunctivitis, bilateral: Secondary | ICD-10-CM | POA: Diagnosis not present

## 2023-03-16 DIAGNOSIS — H66001 Acute suppurative otitis media without spontaneous rupture of ear drum, right ear: Secondary | ICD-10-CM | POA: Diagnosis not present

## 2023-03-16 MED ORDER — AMOXICILLIN 875 MG PO TABS: 875.0000 mg | ORAL_TABLET | Freq: Two times a day (BID) | ORAL | 0 refills | Status: AC

## 2023-03-16 MED ORDER — OLOPATADINE HCL 0.2 % OP SOLN: 1.0000 [drp] | Freq: Every day | OPHTHALMIC | 3 refills | Status: AC

## 2023-03-16 NOTE — Patient Instructions (Signed)
Follow up as needed or as scheduled TAKE the Amoxicillin twice daily- w/ food- for the ear infection USE the eye drop once daily in each eye START a daily Claritin or Zyrtec (store brand generic is just as good) Call with any questions or concerns Hang in there!

## 2023-03-16 NOTE — Progress Notes (Signed)
   Subjective:    Patient ID: Angela Gross, female    DOB: [birth year], 72 y.o.   MRN: [id number]  HPI Watery eyes- pt reports known allergies.  Some crusting of eyes bilaterally.  + itching.  Used Refresh w/ mild relief.  Used OTC Vaseline eye drops and this caused increased burning.  'they drain and they itch and burn'.  Sxs started last week when 'wind was so strong'.  R ear also feels full.  No fever.  + maxillary sinus pressure.  Not currently on allergy medication- 'nothing was working so I stopped'.  No one else w/ red or itchy eyes.     Review of Systems For ROS see HPI     Objective:   Physical Exam Vitals reviewed.  Constitutional:      General: She is not in acute distress.    Appearance: Normal appearance. She is not ill-appearing.  HENT:     Head: Normocephalic and atraumatic.     Right Ear: A middle ear effusion is present. Tympanic membrane is bulging.     Left Ear: Tympanic membrane and ear canal normal.     Nose: Congestion present. No rhinorrhea.  Eyes:     General: Lids are normal.        Right eye: Discharge (scant, watery) present.        Left eye: Discharge (scant, watery) present.    Extraocular Movements: Extraocular movements intact.     Conjunctiva/sclera:     Right eye: Right conjunctiva is injected.     Left eye: Left conjunctiva is injected.  Pulmonary:     Effort: Pulmonary effort is normal. No respiratory distress.  Musculoskeletal:     Cervical back: Neck supple.  Lymphadenopathy:     Cervical: No cervical adenopathy.  Skin:    General: Skin is warm and dry.  Neurological:     General: No focal deficit present.     Mental Status: She is alert and oriented to person, place, and time.  Psychiatric:        Mood and Affect: Mood normal.        Behavior: Behavior normal.        Thought Content: Thought content normal.           Assessment & Plan:   R OM- new.  Likely started as serous effusion due to untreated allergies but now  opaque and bulging.  Start Amoxicillin.  Reviewed supportive care and red flags that should prompt return.  Pt expressed understanding and is in agreement w/ plan.   Allergic conjunctivitis- new.  Pt w/ sxs bilaterally.  Some relief w/ Refresh eye drops but not Visine anti-itch eye drops which made the burning worse.  Eye discharge is very water and not consistent w/ bacterial conjunctivitis.  Start Pataday.  Add daily OTC antihistamine.  Pt expressed understanding and is in agreement w/ plan.

## 2023-03-19 LAB — HM COLONOSCOPY

## 2023-03-26 LAB — COLOGUARD: COLOGUARD: NEGATIVE

## 2023-04-27 ENCOUNTER — Encounter: Payer: Self-pay | Admitting: Family

## 2023-04-27 ENCOUNTER — Ambulatory Visit (INDEPENDENT_AMBULATORY_CARE_PROVIDER_SITE_OTHER): Payer: Medicare Other | Admitting: Family

## 2023-04-27 VITALS — BP 97/68 | HR 84 | Temp 97.8°F | Ht 60.0 in | Wt 155.4 lb

## 2023-04-27 DIAGNOSIS — M1A0790 Idiopathic chronic gout, unspecified ankle and foot, without tophus (tophi): Secondary | ICD-10-CM

## 2023-04-27 DIAGNOSIS — I952 Hypotension due to drugs: Secondary | ICD-10-CM

## 2023-04-27 DIAGNOSIS — R7303 Prediabetes: Secondary | ICD-10-CM

## 2023-04-27 DIAGNOSIS — Z853 Personal history of malignant neoplasm of breast: Secondary | ICD-10-CM

## 2023-04-27 DIAGNOSIS — N1832 Chronic kidney disease, stage 3b: Secondary | ICD-10-CM

## 2023-04-27 DIAGNOSIS — E876 Hypokalemia: Secondary | ICD-10-CM

## 2023-04-27 DIAGNOSIS — E782 Mixed hyperlipidemia: Secondary | ICD-10-CM

## 2023-04-27 DIAGNOSIS — Z1231 Encounter for screening mammogram for malignant neoplasm of breast: Secondary | ICD-10-CM

## 2023-04-27 LAB — COMPREHENSIVE METABOLIC PANEL
ALT: 15 U/L (ref 0–35)
AST: 23 U/L (ref 0–37)
Albumin: 4.2 g/dL (ref 3.5–5.2)
Alkaline Phosphatase: 52 U/L (ref 39–117)
BUN: 15 mg/dL (ref 6–23)
CO2: 29 mEq/L (ref 19–32)
Calcium: 9.6 mg/dL (ref 8.4–10.5)
Chloride: 102 mEq/L (ref 96–112)
Creatinine, Ser: 1.13 mg/dL (ref 0.40–1.20)
GFR: 49.5 mL/min — ABNORMAL LOW (ref >60.00)
Glucose, Bld: 90 mg/dL (ref 70–99)
Potassium: 3.3 mEq/L — ABNORMAL LOW (ref 3.5–5.1)
Sodium: 140 mEq/L (ref 135–145)
Total Bilirubin: 0.4 mg/dL (ref 0.2–1.2)
Total Protein: 6.9 g/dL (ref 6.0–8.3)

## 2023-04-27 LAB — MICROALBUMIN / CREATININE URINE RATIO
Creatinine,U: 21.6 mg/dL
Microalb Creat Ratio: 3.2 mg/g (ref 0.0–30.0)
Microalb, Ur: <0.7 mg/dL (ref 0.0–1.9)

## 2023-04-27 MED ORDER — TRULICITY 1.5 MG/0.5ML ~~LOC~~ SOAJ
1.5000 mg | SUBCUTANEOUS | 1 refills | Status: DC
Start: 2023-04-27 — End: 2023-07-21

## 2023-04-27 MED ORDER — COLCHICINE 0.6 MG PO TABS
0.6000 mg | ORAL_TABLET | Freq: Every day | ORAL | 0 refills | Status: DC | PRN
Start: 2023-04-27 — End: 2024-08-26

## 2023-04-27 MED ORDER — LOSARTAN POTASSIUM 25 MG PO TABS
25.0000 mg | ORAL_TABLET | Freq: Every day | ORAL | 0 refills | Status: DC
Start: 2023-04-27 — End: 2023-09-17

## 2023-04-27 MED ORDER — ATORVASTATIN CALCIUM 20 MG PO TABS
20.0000 mg | ORAL_TABLET | Freq: Every day | ORAL | Status: DC
Start: 2023-04-27 — End: 2023-12-04

## 2023-04-27 NOTE — Assessment & Plan Note (Signed)
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

## 2023-04-27 NOTE — Assessment & Plan Note (Signed)
chronic, stable Last A1C 6.3 Has taken Trulicity 1.5mg  qweek for 2w, wt down a few lbs, pt denies any SE sending refill, will continue another 1-2 mos, then increase  f/u 3 months

## 2023-04-27 NOTE — Assessment & Plan Note (Signed)
chronic - creatinine 1.4, GFR 40 switched from allopurinol to uloric last visit, stopped Gabapentin (did not work) rechecking BMP today continue to advise to drink at least 2L water every day, avoid NSAIDs, Tylenol ok for pain/fever will continue to monitor

## 2023-04-27 NOTE — Progress Notes (Unsigned)
Patient ID: Angela Gross, female    DOB: [birth year], 72 y.o.   MRN: [id number]  Chief Complaint  Patient presents with  . Borderline diabetes    HPI: Prediabetes:  hx of having for years, last A1C in EMR 6.0, most recent 6.3. Pt has been on Trulicity 0.75mg , recently increased to 1.5mg  qweek. Pt does not check CBGs at home.  CKD:  stage 3B, last creatinine 1.43, no GFR. Switched Allopurionol to [employer].  Pt avoiding NSAIDs, could drink more water.  Gout:  has had for years, was taking 200mg  Allopurinol qd for prevention and Colchicine for flare ups. Reports last flare about 2 mos ago. Last visit Allopurinol stopped due to CKD, switched to Uloric. Pt reports having pain, swelling, burning on top of right foot over the last week.  Assessment & Plan:  Borderline diabetes Assessment & Plan: chronic, stable Last A1C 6.3 Has taken Trulicity 1.5mg  qweek for 2w, wt down a few lbs, pt denies any SE sending refill, will continue another 1-2 mos, then increase  f/u 3 months  Orders: -     Microalbumin / creatinine urine ratio -     Trulicity; Inject 1.5 mg into the skin once a week.  Dispense: 2 mL; Refill: 1 -     Losartan Potassium; Take 1 tablet (25 mg total) by mouth daily. OK to take an extra pill if you feel your blood pressure is high.  Dispense: 90 tablet; Refill: 0 -     Comprehensive metabolic panel  Chronic kidney disease, stage 3b (HCC) Assessment & Plan: chronic - creatinine 1.4, GFR 40 switched from allopurinol to uloric last visit, stopped Gabapentin (did not work) rechecking BMP today continue to advise to drink at least 2L water every day, avoid NSAIDs, Tylenol ok for pain/fever will continue to monitor  Orders: -     Losartan Potassium; Take 1 tablet (25 mg total) by mouth daily. OK to take an extra pill if you feel your blood pressure is high.  Dispense: 90 tablet; Refill: 0 -     Comprehensive metabolic panel  Chronic gout of foot, unspecified cause,  unspecified laterality Assessment & Plan: chronic, reports having some sx lately, ran out of colchicine switched to Uloric last visit d/t CKD w/Allopurinol sending refill of Colchicine, advised on use, only prn continue to advise on foods to avoid, eat yogurt or drink a little cherry juice as soon as sx start  rechecking BMP today f/u 3 mos  Orders: -     Colchicine; Take 1 tablet (0.6 mg total) by mouth daily as needed (For Gout pain. Take for 5 days only.).  Dispense: 30 tablet; Refill: 0  Mixed hyperlipidemia Assessment & Plan: chronic taking Lipitor 20mg  every day - below labs NON-fasting    Component Value Date/Time   CHOL 175 02/03/2023 0902   TRIG 329 (H) 02/03/2023 0902   HDL 35 (L) 02/03/2023 0902   CHOLHDL 5.0 (H) 02/03/2023 0902   LDLCALC 96 02/03/2023 0902   adding Fish Oil 1G bid f/u 6 mos, fasting labs  Orders: -     Atorvastatin Calcium; Take 1 tablet (20 mg total) by mouth daily.  Encounter for screening mammogram for malignant neoplasm of breast -     3D Screening Mammogram, Left; Future  Personal history of malignant neoplasm of breast -     3D Screening Mammogram, Left; Future  Hypotension due to drugs- BP low today, pt asymptomatic, advised to stop Losartan-hydrochlorothiazide 50-12.5mg , sending Losartan 25mg , can  continue Lasix 20mg  prn. Advised on increasing water intake & not skipping meals.    Subjective:    Outpatient Medications Prior to Visit  Medication Sig Dispense Refill  . ACCU-CHEK GUIDE test strip USE TO TEST BLOOD SUGARS ONCE WEEKLY    . febuxostat (ULORIC) 40 MG tablet Take 1 tablet (40 mg total) by mouth daily. 90 tablet 1  . fluticasone (FLONASE) 50 MCG/ACT nasal spray Place into both nostrils.    . furosemide (LASIX) 20 MG tablet Take 20 mg by mouth daily as needed for fluid.    Marland Kitchen leflunomide (ARAVA) 20 MG tablet Take 20 mg by mouth daily.    . Olopatadine HCl 0.2 % SOLN Apply 1 drop to eye daily. 2.5 mL 3  . Upadacitinib ER  (RINVOQ) 15 MG TB24 Take by mouth.    Marland Kitchen atorvastatin (LIPITOR) 20 MG tablet Take 20 mg by mouth daily.    . colchicine 0.6 MG tablet Take by mouth.    . Dulaglutide (TRULICITY) 1.5 MG/0.5ML SOPN Inject 1.5 mg into the skin once a week. 2 mL 2  . losartan-hydrochlorothiazide (HYZAAR) 50-12.5 MG tablet Take 1 tablet by mouth every morning.     No facility-administered medications prior to visit.   Past Medical History:  Diagnosis Date  . Anxiety   . Breast cancer (HCC)    "right" (01/10/2014)  . Chronic left shoulder pain    "work related injury" (01/10/2014)  . Constipation 10/04/2014  . Degenerative disc disease, cervical   . Fibromyalgia   . Full dentures   . History of breast cancer in female 10/25/2013  . Hot flashes, menopausal 04/04/2014  . Hypertension   . Osteoarthritis of both knees   . Osteoporosis   . Personal history of chemotherapy   . PONV (postoperative nausea and vomiting)   . Raynaud disease   . Rheumatoid arthritis (HCC)    "elbows; fingers; toes"  . Syncope and collapse    "4 times in the last year" (01/10/2014)  . Wears glasses    Past Surgical History:  Procedure Laterality Date  . BREAST BIOPSY Right 10/2013  . BREAST IMPLANT EXCHANGE Right 02/12/2016   Procedure: EXCHANGE SILICONE IMPLANT ON RIGHT, CAPSULORRAPHY AND PLACEMENT OF ALLODERM;  Surgeon: Glenna Fellows, MD;  Location: [facility];  Service: Plastics;  Laterality: Right;  . BREAST RECONSTRUCTION Right 12/12/2014   Procedure: REVISION OF RIGHT RECONSTRUCTIVE BREAST WITH CAPSULORRHAPHY PLACEMENT OF SILCONE IMPLANTS AND LIPOFILLING TO RIGHT CHEST ;  Surgeon: Glenna Fellows, MD;  Location: [facility];  Service: Plastics;  Laterality: Right;  . BREAST RECONSTRUCTION Right 03/06/2015   Procedure: RIGHT NIPPLE AEROLA COMPLEX RECONSTRUCTION AND LOCAL FLAP WITH LIPO FILLING TO RIGHT BREAST,REMOVAL RIGHT CHEST PORT;  Surgeon: Glenna Fellows, MD;  Location: [facility];  Service: Plastics;  Laterality: Right;  . BREAST RECONSTRUCTION Right 05/18/2018   Procedure: revision right breast reconstruction with silicone implant exchange;  Surgeon: Glenna Fellows, MD;  Location: [facility];  Service: Plastics;  Laterality: Right;  . BREAST RECONSTRUCTION WITH PLACEMENT OF TISSUE EXPANDER AND FLEX HD (ACELLULAR HYDRATED DERMIS) Right 12/13/2013   Procedure: RIGHT BREAST RECONSTRUCTION WITH PLACEMENT OF TISSUE EXPANDER AND FLEX HD (ACELLULAR HYDRATED DERMIS);  Surgeon: Glenna Fellows, MD;  Location: [facility];  Service: Plastics;  Laterality: Right;  . CHOLECYSTECTOMY  1970's  . COLONOSCOPY    . LIPOMA EXCISION Bilateral 12/13/2013   Procedure: EXCISION OF LEFT BREAST KELOID AND RIGHT CHEST KELOID;  Surgeon: Glenna Fellows, MD;  Location: [facility];  Service: Plastics;  Laterality: Bilateral;  . LIPOSUCTION WITH LIPOFILLING Right 09/26/2014   Procedure: LIPOSUCTION WITH LIPOFILLING;  Surgeon: Glenna Fellows, MD;  Location: [facility];  Service: Plastics;  Laterality: Right;  . LIPOSUCTION WITH LIPOFILLING Right 12/12/2014   Procedure: LIPOSUCTION WITH LIPOFILLING;  Surgeon: Glenna Fellows, MD;  Location: [facility];  Service: Plastics;  Laterality: Right;  . LIPOSUCTION WITH LIPOFILLING Right 03/06/2015   Procedure: ABDOMINAL LIPOSUCTION WITH LIPOFILLING TO RIGHT BREAST;  Surgeon: Glenna Fellows, MD;  Location: [facility];  Service: Plastics;  Laterality: Right;  . MASTECTOMY Right 2015  . MASTECTOMY W/ SENTINEL NODE BIOPSY Right 12/13/2013   Procedure: RIGHT TOTAL MASTECTOMY WITH SENTINEL LYMPH NODE BIOPSY;  Surgeon: Ernestene Mention, MD;  Location: [facility];  Service: General;  Laterality: Right;  . MASTOPEXY Left 09/26/2014   Procedure: MASTOPEXY;  Surgeon: Glenna Fellows, MD;  Location: [facility];  Service: Plastics;  Laterality:  Left;  . PORT-A-CATH REMOVAL Right 03/06/2015   Procedure: REMOVAL PORT-A-CATH;  Surgeon: Glenna Fellows, MD;  Location: [facility];  Service: Plastics;  Laterality: Right;  . PORTACATH PLACEMENT Right 12/13/2013   Procedure: INSERTION PORT-A-CATH;  Surgeon: Ernestene Mention, MD;  Location: [facility];  Service: General;  Laterality: Right;  . REDUCTION MAMMAPLASTY Left 2015  . REMOVAL OF BILATERAL TISSUE EXPANDERS WITH PLACEMENT OF BILATERAL BREAST IMPLANTS Bilateral 09/26/2014   Procedure: REMOVAL OF RIGHT TISSUE EXPANDER WITH PLACEMENT OF RIGHT BREAST IMPLANT LIPOFILLING TO RIGHT BREAST/LEFT BREAST MASTOPEXY FOR SYMMETRY;  Surgeon: Glenna Fellows, MD;  Location: [facility];  Service: Plastics;  Laterality: Bilateral;  . TISSUE EXPANDER PLACEMENT Right 01/10/2014   Procedure: Incision and Drainage Right Breast Seroma with TISSUE EXPANDER exchange;  Surgeon: Glenna Fellows, MD;  Location: [facility];  Service: Plastics;  Laterality: Right;  Marland Kitchen VAGINAL HYSTERECTOMY  1980   no salpingo-oophoretomu   Allergies  Allergen Reactions  . Codeine Hives and Other (See Comments)    Altered mental status, numbness   . Diazepam Other (See Comments)    Delusions, hallucinations       Objective:    Physical Exam Vitals and nursing note reviewed.  Constitutional:      Appearance: Normal appearance.  Cardiovascular:     Rate and Rhythm: Normal rate and regular rhythm.  Pulmonary:     Effort: Pulmonary effort is normal.     Breath sounds: Normal breath sounds.  Musculoskeletal:        General: Normal range of motion.     Right lower leg: Edema (right ankle, foot, 3+ edema, non-pitting) present.  Skin:    General: Skin is warm and dry.  Neurological:     Mental Status: She is alert.  Psychiatric:        Mood and Affect: Mood normal.        Behavior: Behavior normal.   BP 97/68   Pulse 84   Temp 97.8 F (36.6 C) (Temporal)   Ht 5' (1.524 m)   Wt  155 lb 6.4 oz (70.5 kg)   SpO2 98%   BMI 30.35 kg/m  Wt Readings from Last 3 Encounters:  04/27/23 155 lb 6.4 oz (70.5 kg)  03/16/23 158 lb 2 oz (71.7 kg)  03/04/23 162 lb (73.5 kg)      Dulce Sellar, NP

## 2023-04-27 NOTE — Patient Instructions (Addendum)
It was very nice to see you today!   I will review your lab results via MyChart in a few days or we will call you.  Look for a Vitamin B complex for energy.  Also should take Vitamin D3 up to 4,000 units daily for good bone health, immunity, and can help prevent fatigue. Be sure you are drinking 8 cups of water every day!  I have sent refills for your blood pressure, Losartan - STOP taking the Losartan-hydrochlorothiazide (with Hydrochlorothiazide) Take just 25mg  daily, if you feel like your pressure is getting higher, take another pill or 2 at a time.  I refilled the Colchicine for your gout, but remember to only take this AS NEEDED, no more than 3-4 days at a time.      PLEASE NOTE:  If you had any lab tests please let us know if you have not heard back within a few days. You may see your results on MyChart before we have a chance to review them but we will give you a call once they are reviewed by [place]. If we ordered any referrals today, please let us know if you have not heard from their office within the next week.

## 2023-04-27 NOTE — Assessment & Plan Note (Signed)
chronic, reports having some sx lately, ran out of colchicine switched to Uloric last visit d/t CKD w/Allopurinol sending refill of Colchicine, advised on use, only prn continue to advise on foods to avoid, eat yogurt or drink a little cherry juice as soon as sx start  rechecking BMP today f/u 3 mos

## 2023-04-27 NOTE — Assessment & Plan Note (Signed)
chronic taking Lipitor 20mg  every day - below labs NON-fasting    Component Value Date/Time   CHOL 175 02/03/2023 0902   TRIG 329 (H) 02/03/2023 0902   HDL 35 (L) 02/03/2023 0902   CHOLHDL 5.0 (H) 02/03/2023 0902   LDLCALC 96 02/03/2023 0902   adding Fish Oil 1G bid f/u 6 mos, fasting labs

## 2023-04-29 NOTE — Progress Notes (Signed)
See message below - Call [facility] to schedule a non-fasting lab - order entered - for 1 week, thanks.

## 2023-05-14 ENCOUNTER — Ambulatory Visit (INDEPENDENT_AMBULATORY_CARE_PROVIDER_SITE_OTHER): Payer: Medicare Other | Admitting: Family

## 2023-05-14 VITALS — BP 135/89 | HR 79 | Temp 98.0°F | Ht 60.0 in | Wt 156.2 lb

## 2023-05-14 DIAGNOSIS — T502X5A Adverse effect of carbonic-anhydrase inhibitors, benzothiadiazides and other diuretics, initial encounter: Secondary | ICD-10-CM

## 2023-05-14 DIAGNOSIS — E876 Hypokalemia: Secondary | ICD-10-CM

## 2023-05-14 DIAGNOSIS — N1832 Chronic kidney disease, stage 3b: Secondary | ICD-10-CM

## 2023-05-14 LAB — BASIC METABOLIC PANEL
BUN: 11 mg/dL (ref 6–23)
CO2: 26 mEq/L (ref 19–32)
Calcium: 9.4 mg/dL (ref 8.4–10.5)
Chloride: 104 mEq/L (ref 96–112)
Creatinine, Ser: 1.08 mg/dL (ref 0.40–1.20)
GFR: 52.25 mL/min — ABNORMAL LOW (ref >60.00)
Glucose, Bld: 80 mg/dL (ref 70–99)
Potassium: 3.3 mEq/L — ABNORMAL LOW (ref 3.5–5.1)
Sodium: 139 mEq/L (ref 135–145)

## 2023-05-14 MED ORDER — POTASSIUM CHLORIDE CRYS ER 20 MEQ PO TBCR: 20.0000 meq | EXTENDED_RELEASE_TABLET | Freq: Every day | ORAL | 5 refills | Status: DC

## 2023-05-14 NOTE — Progress Notes (Signed)
Patient ID: Angela Gross, female    DOB: [birth year], 72 y.o.   MRN: [id number]  Chief Complaint  Patient presents with   Low potassium    lab work    HPI: Hypokalemia:  pt here to recheck her potassium due to low reading on 7/17. pt is taking Lasix, reports last 2 weeks, but did not take today. She used to be on Losartan-HCTZ but states she has stopped this and only taking the Losartan now.   Assessment & Plan:  Hypokalemia- denies any heart palpitations or skipped beats, no tachycardia, dizziness or nausea. Advised to take Lasix 3-4 days for ankle/foot swelling and then stop. Reminder to hydrate with 2L water daily. Based on lab result today, may send low dose K+ to take with the Lasix.   Subjective:    Outpatient Medications Prior to Visit  Medication Sig Dispense Refill   ACCU-CHEK GUIDE test strip USE TO TEST BLOOD SUGARS ONCE WEEKLY     atorvastatin (LIPITOR) 20 MG tablet Take 1 tablet (20 mg total) by mouth daily.     B Complex-C (B-COMPLEX WITH VITAMIN C) tablet Take 1 tablet by mouth daily.     Cholecalciferol (VITAMIN D3) 250 MCG (10000 UT) capsule Take 10,000 Units by mouth daily.     colchicine 0.6 MG tablet Take 1 tablet (0.6 mg total) by mouth daily as needed (For Gout pain. Take for 5 days only.). 30 tablet 0   Dulaglutide (TRULICITY) 1.5 MG/0.5ML SOPN Inject 1.5 mg into the skin once a week. 2 mL 1   febuxostat (ULORIC) 40 MG tablet Take 1 tablet (40 mg total) by mouth daily. 90 tablet 1   fluticasone (FLONASE) 50 MCG/ACT nasal spray Place into both nostrils.     furosemide (LASIX) 20 MG tablet Take 20 mg by mouth daily as needed for fluid.     leflunomide (ARAVA) 20 MG tablet Take 20 mg by mouth daily.     losartan (COZAAR) 25 MG tablet Take 1 tablet (25 mg total) by mouth daily. OK to take an extra pill if you feel your blood pressure is high. 90 tablet 0   Olopatadine HCl 0.2 % SOLN Apply 1 drop to eye daily. 2.5 mL 3   Upadacitinib ER (RINVOQ) 15 MG TB24 Take  by mouth.     No facility-administered medications prior to visit.   Past Medical History:  Diagnosis Date   Anxiety    Breast cancer (HCC)    "right" (01/10/2014)   Chronic left shoulder pain    "work related injury" (01/10/2014)   Constipation 10/04/2014   Degenerative disc disease, cervical    Fibromyalgia    Full dentures    History of breast cancer in female 10/25/2013   Hot flashes, menopausal 04/04/2014   Hypertension    Osteoarthritis of both knees    Osteoporosis    Personal history of chemotherapy    PONV (postoperative nausea and vomiting)    Raynaud disease    Rheumatoid arthritis (HCC)    "elbows; fingers; toes"   Syncope and collapse    "4 times in the last year" (01/10/2014)   Wears glasses    Past Surgical History:  Procedure Laterality Date   BREAST BIOPSY Right 10/2013   BREAST IMPLANT EXCHANGE Right 02/12/2016   Procedure: EXCHANGE SILICONE IMPLANT ON RIGHT, CAPSULORRAPHY AND PLACEMENT OF ALLODERM;  Surgeon: Glenna Fellows, MD;  Location: [facility];  Service: Plastics;  Laterality: Right;   BREAST RECONSTRUCTION Right 12/12/2014  Procedure: REVISION OF RIGHT RECONSTRUCTIVE BREAST WITH CAPSULORRHAPHY PLACEMENT OF SILCONE IMPLANTS AND LIPOFILLING TO RIGHT CHEST ;  Surgeon: Glenna Fellows, MD;  Location: [facility];  Service: Plastics;  Laterality: Right;   BREAST RECONSTRUCTION Right 03/06/2015   Procedure: RIGHT NIPPLE AEROLA COMPLEX RECONSTRUCTION AND LOCAL FLAP WITH LIPO FILLING TO RIGHT BREAST,REMOVAL RIGHT CHEST PORT;  Surgeon: Glenna Fellows, MD;  Location: [facility];  Service: Plastics;  Laterality: Right;   BREAST RECONSTRUCTION Right 05/18/2018   Procedure: revision right breast reconstruction with silicone implant exchange;  Surgeon: Glenna Fellows, MD;  Location: [facility];  Service: Plastics;  Laterality: Right;   BREAST RECONSTRUCTION WITH PLACEMENT OF TISSUE EXPANDER AND FLEX HD  (ACELLULAR HYDRATED DERMIS) Right 12/13/2013   Procedure: RIGHT BREAST RECONSTRUCTION WITH PLACEMENT OF TISSUE EXPANDER AND FLEX HD (ACELLULAR HYDRATED DERMIS);  Surgeon: Glenna Fellows, MD;  Location: [facility];  Service: Plastics;  Laterality: Right;   CHOLECYSTECTOMY  1970's   COLONOSCOPY     LIPOMA EXCISION Bilateral 12/13/2013   Procedure: EXCISION OF LEFT BREAST KELOID AND RIGHT CHEST KELOID;  Surgeon: Glenna Fellows, MD;  Location: [facility];  Service: Plastics;  Laterality: Bilateral;   LIPOSUCTION WITH LIPOFILLING Right 09/26/2014   Procedure: LIPOSUCTION WITH LIPOFILLING;  Surgeon: Glenna Fellows, MD;  Location: [facility];  Service: Plastics;  Laterality: Right;   LIPOSUCTION WITH LIPOFILLING Right 12/12/2014   Procedure: LIPOSUCTION WITH LIPOFILLING;  Surgeon: Glenna Fellows, MD;  Location: [facility];  Service: Plastics;  Laterality: Right;   LIPOSUCTION WITH LIPOFILLING Right 03/06/2015   Procedure: ABDOMINAL LIPOSUCTION WITH LIPOFILLING TO RIGHT BREAST;  Surgeon: Glenna Fellows, MD;  Location: [facility];  Service: Plastics;  Laterality: Right;   MASTECTOMY Right 2015   MASTECTOMY W/ SENTINEL NODE BIOPSY Right 12/13/2013   Procedure: RIGHT TOTAL MASTECTOMY WITH SENTINEL LYMPH NODE BIOPSY;  Surgeon: Ernestene Mention, MD;  Location: [facility];  Service: General;  Laterality: Right;   MASTOPEXY Left 09/26/2014   Procedure: MASTOPEXY;  Surgeon: Glenna Fellows, MD;  Location: [facility];  Service: Plastics;  Laterality: Left;   PORT-A-CATH REMOVAL Right 03/06/2015   Procedure: REMOVAL PORT-A-CATH;  Surgeon: Glenna Fellows, MD;  Location: [facility];  Service: Plastics;  Laterality: Right;   PORTACATH PLACEMENT Right 12/13/2013   Procedure: INSERTION PORT-A-CATH;  Surgeon: Ernestene Mention, MD;  Location: [facility];  Service: General;  Laterality:  Right;   REDUCTION MAMMAPLASTY Left 2015   REMOVAL OF BILATERAL TISSUE EXPANDERS WITH PLACEMENT OF BILATERAL BREAST IMPLANTS Bilateral 09/26/2014   Procedure: REMOVAL OF RIGHT TISSUE EXPANDER WITH PLACEMENT OF RIGHT BREAST IMPLANT LIPOFILLING TO RIGHT BREAST/LEFT BREAST MASTOPEXY FOR SYMMETRY;  Surgeon: Glenna Fellows, MD;  [facility];  Service: Plastics;  Laterality: Bilateral;   TISSUE EXPANDER PLACEMENT Right 01/10/2014   Procedure: Incision and Drainage Right Breast Seroma with TISSUE EXPANDER exchange;  Surgeon: Glenna Fellows, MD;  Location: [facility];  Service: Plastics;  Laterality: Right;   VAGINAL HYSTERECTOMY  1980   no salpingo-oophoretomu   Allergies  Allergen Reactions   Codeine Hives and Other (See Comments)    Altered mental status, numbness    Diazepam Other (See Comments)    Delusions, hallucinations       Objective:    Physical Exam Vitals and nursing note reviewed.  Constitutional:      Appearance: Normal appearance. She is obese.  Cardiovascular:  Rate and Rhythm: Normal rate and regular rhythm.  Pulmonary:     Effort: Pulmonary effort is normal.     Breath sounds: Normal breath sounds.  Musculoskeletal:        General: Normal range of motion.  Skin:    General: Skin is warm and dry.  Neurological:     Mental Status: She is alert.  Psychiatric:        Mood and Affect: Mood normal.        Behavior: Behavior normal.    BP 135/89   Pulse 79   Temp 98 F (36.7 C) (Temporal)   Ht 5' (1.524 m)   Wt 156 lb 3.2 oz (70.9 kg)   SpO2 97%   BMI 30.51 kg/m  Wt Readings from Last 3 Encounters:  05/14/23 156 lb 3.2 oz (70.9 kg)  04/27/23 155 lb 6.4 oz (70.5 kg)  03/16/23 158 lb 2 oz (71.7 kg)       Dulce Sellar, NP

## 2023-05-14 NOTE — Addendum Note (Signed)
Addended byDulce Sellar on: 05/14/2023 09:23 PM   Modules accepted: Orders

## 2023-05-14 NOTE — Progress Notes (Signed)
Please call Angela Gross - Like we discussed, your potassium level is low again, so I am sending the potassium supplement to take on the days you take the the Lasix.   Your kidney function improved a little, hopefully you are drinking more water!

## 2023-07-03 ENCOUNTER — Ambulatory Visit
Admission: RE | Admit: 2023-07-03 | Discharge: 2023-07-03 | Disposition: A | Discharge status: Home / Self Care | Payer: Medicare Other | Source: Ambulatory Visit | Attending: Family | Admitting: Family

## 2023-07-03 DIAGNOSIS — Z1231 Encounter for screening mammogram for malignant neoplasm of breast: Secondary | ICD-10-CM

## 2023-07-03 DIAGNOSIS — Z853 Personal history of malignant neoplasm of breast: Secondary | ICD-10-CM

## 2023-07-07 ENCOUNTER — Other Ambulatory Visit: Payer: Self-pay | Admitting: Family

## 2023-07-07 DIAGNOSIS — R928 Other abnormal and inconclusive findings on diagnostic imaging of breast: Secondary | ICD-10-CM

## 2023-07-14 ENCOUNTER — Ambulatory Visit
Admission: RE | Admit: 2023-07-14 | Discharge: 2023-07-14 | Disposition: A | Discharge status: Home / Self Care | Payer: Medicare Other | Source: Ambulatory Visit | Attending: Family | Admitting: Family

## 2023-07-14 ENCOUNTER — Other Ambulatory Visit: Payer: Self-pay | Admitting: Family

## 2023-07-14 DIAGNOSIS — R921 Mammographic calcification found on diagnostic imaging of breast: Secondary | ICD-10-CM

## 2023-07-14 DIAGNOSIS — R928 Other abnormal and inconclusive findings on diagnostic imaging of breast: Secondary | ICD-10-CM

## 2023-07-20 ENCOUNTER — Other Ambulatory Visit: Payer: Self-pay | Admitting: Family

## 2023-07-20 DIAGNOSIS — R7303 Prediabetes: Secondary | ICD-10-CM

## 2023-07-22 ENCOUNTER — Ambulatory Visit
Admission: RE | Admit: 2023-07-22 | Discharge: 2023-07-22 | Disposition: A | Discharge status: Home / Self Care | Payer: Medicare Other | Source: Ambulatory Visit | Attending: Family | Admitting: Family

## 2023-07-22 DIAGNOSIS — R921 Mammographic calcification found on diagnostic imaging of breast: Secondary | ICD-10-CM

## 2023-07-22 HISTORY — PX: BREAST BIOPSY: SHX20

## 2023-07-23 LAB — SURGICAL PATHOLOGY

## 2023-08-25 ENCOUNTER — Other Ambulatory Visit: Payer: Self-pay | Admitting: Family

## 2023-08-25 DIAGNOSIS — R7303 Prediabetes: Secondary | ICD-10-CM

## 2023-09-02 ENCOUNTER — Ambulatory Visit
Admission: RE | Admit: 2023-09-02 | Discharge: 2023-09-02 | Disposition: A | Discharge status: Home / Self Care | Payer: Medicare Other | Source: Ambulatory Visit | Attending: Family | Admitting: Family

## 2023-09-02 DIAGNOSIS — Z780 Asymptomatic menopausal state: Secondary | ICD-10-CM

## 2023-09-09 NOTE — Progress Notes (Signed)
See Mychart msg for DEXA results.

## 2023-09-15 ENCOUNTER — Encounter: Payer: Self-pay | Admitting: Family

## 2023-09-15 ENCOUNTER — Ambulatory Visit (INDEPENDENT_AMBULATORY_CARE_PROVIDER_SITE_OTHER): Payer: Medicare Other | Admitting: Family

## 2023-09-15 VITALS — BP 163/96 | HR 85 | Temp 98.0°F | Ht 60.0 in | Wt 155.2 lb

## 2023-09-15 DIAGNOSIS — M85851 Other specified disorders of bone density and structure, right thigh: Secondary | ICD-10-CM

## 2023-09-15 DIAGNOSIS — M85852 Other specified disorders of bone density and structure, left thigh: Secondary | ICD-10-CM | POA: Diagnosis not present

## 2023-09-15 DIAGNOSIS — N1832 Chronic kidney disease, stage 3b: Secondary | ICD-10-CM

## 2023-09-15 DIAGNOSIS — T502X5A Adverse effect of carbonic-anhydrase inhibitors, benzothiadiazides and other diuretics, initial encounter: Secondary | ICD-10-CM

## 2023-09-15 DIAGNOSIS — R030 Elevated blood-pressure reading, without diagnosis of hypertension: Secondary | ICD-10-CM | POA: Insufficient documentation

## 2023-09-15 DIAGNOSIS — G620 Drug-induced polyneuropathy: Secondary | ICD-10-CM

## 2023-09-15 DIAGNOSIS — M25552 Pain in left hip: Secondary | ICD-10-CM

## 2023-09-15 DIAGNOSIS — M8588 Other specified disorders of bone density and structure, other site: Secondary | ICD-10-CM

## 2023-09-15 DIAGNOSIS — E876 Hypokalemia: Secondary | ICD-10-CM

## 2023-09-15 DIAGNOSIS — M25551 Pain in right hip: Secondary | ICD-10-CM

## 2023-09-15 DIAGNOSIS — I10 Essential (primary) hypertension: Secondary | ICD-10-CM

## 2023-09-15 DIAGNOSIS — R7303 Prediabetes: Secondary | ICD-10-CM | POA: Diagnosis not present

## 2023-09-15 DIAGNOSIS — T451X5A Adverse effect of antineoplastic and immunosuppressive drugs, initial encounter: Secondary | ICD-10-CM | POA: Diagnosis not present

## 2023-09-15 HISTORY — DX: Elevated blood-pressure reading, without diagnosis of hypertension: R03.0

## 2023-09-15 LAB — BASIC METABOLIC PANEL
BUN: 16 mg/dL (ref 6–23)
CO2: 30 meq/L (ref 19–32)
Calcium: 10 mg/dL (ref 8.4–10.5)
Chloride: 105 meq/L (ref 96–112)
Creatinine, Ser: 1.26 mg/dL — ABNORMAL HIGH (ref 0.40–1.20)
GFR: 43.32 mL/min — ABNORMAL LOW (ref >60.00)
Glucose, Bld: 93 mg/dL (ref 70–99)
Potassium: 4.9 meq/L (ref 3.5–5.1)
Sodium: 140 meq/L (ref 135–145)

## 2023-09-15 MED ORDER — POTASSIUM CHLORIDE CRYS ER 20 MEQ PO TBCR
20.0000 meq | EXTENDED_RELEASE_TABLET | ORAL | Status: AC
Start: 2023-09-15 — End: ?

## 2023-09-15 MED ORDER — PREGABALIN 50 MG PO CAPS
50.0000 mg | ORAL_CAPSULE | Freq: Three times a day (TID) | ORAL | 2 refills | Status: DC
Start: 2023-09-15 — End: 2023-12-13

## 2023-09-15 NOTE — Assessment & Plan Note (Signed)
chronic, unstable History of chemotherapy-induced neuropathy, with symptoms predating chemotherapy. Previous use of gabapentin with mixed results. Concerns about potential kidney impact. -Starting trial of low dose Lyrica (pregabalin), monitoring kidney function closely. Last GFR 52. -Encourage patient to maintain hydration to support kidney function. -Pt advised on use & SE. -F/U in 3 mos.

## 2023-09-15 NOTE — Assessment & Plan Note (Addendum)
chronic, stable - L1-L2 Dexa Scan results reviewed: Spine and hips showed improved numbers in her Osteopenia from last scan 2019. Prolia stopped at that time d/t improved numbers from previous scan which showed Osteoporosis.  -Continue Vitamin D 5,000 units daily and eating a calcium rich diet. -Encourage weight-bearing exercise such as walking when patient's condition allows. -Check bone density results again in 2 years.

## 2023-09-15 NOTE — Progress Notes (Unsigned)
Patient ID: Angela Gross, female    DOB: 1951-03-11, 72 y.o.   MRN: [id number]  Chief Complaint  Patient presents with   Numbness    Pt c/o numbness in bilateral lower legs and feet, Present for 2 weeks.    Hip Pain    Pt c/o left hip pain for 2 weeks, Pt states she has RA in the lower part of her back.    Discussed the use of AI scribe software for clinical note transcription with the patient, who gave verbal consent to proceed.  History of Present Illness   The patient, with a history of chemotherapy-induced neuropathy and rheumatoid arthritis, presents with bilateral hip pain.  She also reports numbness in her fingers and feet, which she states she had before chemotherapy. She was previously on gabapentin for her neuropathy, which helped occasionally, but sometimes did not. She does not recall any side effects from the gabapentin.   She also reports hip pain that moves from one side to the other, and is more painful when she has been sitting for a long time. She has to stand still before moving after sitting. She also reports that her legs will stop moving if she walks a long distance. She was diagnosed with rheumatoid arthritis years ago and is followed by Rheumatology, but she states they are mainly focused with her hands and advised her to see Ortho. She has not had any imaging on her hips, but has had imaging on her lower spine & and a recent bone density test. She has been taking vitamin D daily, but not extra calcium. She also reports numbness in one finger.    Assessment & Plan:     Hypokalemia - Patient is on potassium supplements, taken when Lasix is used. No recent use of Lasix due to lack of edema. No recheck since last low K+ of 3.3. -Check potassium level today. -Continue to take one potassium pill with one Lasix when needed. -F/U in 3 mos  Peripheral Neuropathy - History of chemotherapy-induced neuropathy, with symptoms predating chemotherapy. Previous use of  gabapentin with mixed results. Concerns about potential kidney impact. -Starting trial of low dose Lyrica (pregabalin), monitoring kidney function closely. Last GFR 52. -Encourage patient to maintain hydration to support kidney function. -Pt advised on use & SE. -F/U in 3 mos.  Hip Pain - Chronic hip pain, possibly related to osteoarthritis. Pain is variable and affects mobility. No recent imaging or specialist consultation. -Refer to Orthopedic specialist (Dr. Gennie Alma preferred who sees pt dtr) for further evaluation and management.  Osteopenia -  Dexa Scan results reviewed: Spine and hips showed improved numbers in her Osteopenia from last scan 5 yrs ago.  -Continue Vitamin D 5,000 units daily and eating a calcium rich diet. -Encourage weight-bearing exercise such as walking when patient's condition allows. -Check bone density results again in 2 years.   HTN - -pt advised on taking Losartan qd, sending refill -f/u in 3 mos -Checking BMP today   Subjective:    Outpatient Medications Prior to Visit  Medication Sig Dispense Refill   ACCU-CHEK GUIDE test strip USE TO TEST BLOOD SUGARS ONCE WEEKLY     atorvastatin (LIPITOR) 20 MG tablet Take 1 tablet (20 mg total) by mouth daily.     B Complex-C (B-COMPLEX WITH VITAMIN C) tablet Take 1 tablet by mouth daily.     colchicine 0.6 MG tablet Take 1 tablet (0.6 mg total) by mouth daily as needed (For Gout pain. Take  for 5 days only.). 30 tablet 0   febuxostat (ULORIC) 40 MG tablet Take 1 tablet (40 mg total) by mouth daily. 90 tablet 1   fluticasone (FLONASE) 50 MCG/ACT nasal spray Place into both nostrils.     furosemide (LASIX) 20 MG tablet Take 20 mg by mouth daily as needed for fluid.     leflunomide (ARAVA) 20 MG tablet Take 20 mg by mouth daily.     Olopatadine HCl 0.2 % SOLN Apply 1 drop to eye daily. 2.5 mL 3   TRULICITY 1.5 MG/0.5ML SOAJ INJECT 1.5 MG INTO THE SKIN ONCE A WEEK. 2 mL 1   Upadacitinib ER (RINVOQ) 15 MG TB24 Take  by mouth.     Cholecalciferol (VITAMIN D3) 250 MCG (10000 UT) capsule Take 10,000 Units by mouth daily.     losartan (COZAAR) 25 MG tablet Take 1 tablet (25 mg total) by mouth daily. OK to take an extra pill if you feel your blood pressure is high. 90 tablet 0   potassium chloride SA (KLOR-CON M) 20 MEQ tablet Take 1 tablet (20 mEq total) by mouth daily. Take each day you take a Lasix pill. 30 tablet 5   No facility-administered medications prior to visit.   Past Medical History:  Diagnosis Date   Anxiety    Breast cancer (HCC)    "right" (01/10/2014)   Chronic left shoulder pain    "work related injury" (01/10/2014)   Constipation 10/04/2014   Degenerative disc disease, cervical    Fibromyalgia    Full dentures    History of breast cancer in female 10/25/2013   Hot flashes, menopausal 04/04/2014   Hypertension    Osteoarthritis of both knees    Osteoporosis    Personal history of chemotherapy    PONV (postoperative nausea and vomiting)    Raynaud disease    Rheumatoid arthritis (HCC)    "elbows; fingers; toes"   Syncope and collapse    "4 times in the last year" (01/10/2014)   Wears glasses    Past Surgical History:  Procedure Laterality Date   BREAST BIOPSY Right 10/2013   BREAST BIOPSY Left 07/22/2023   MM LT BREAST BX W LOC DEV 1ST LESION IMAGE BX SPEC STEREO GUIDE 07/22/2023 [facility]   BREAST BIOPSY Left 07/22/2023   MM LT BREAST BX W LOC DEV EA AD LESION IMG BX SPEC STEREO GUIDE 07/22/2023 [facility]   BREAST IMPLANT EXCHANGE Right 02/12/2016   Procedure: EXCHANGE SILICONE IMPLANT ON RIGHT, CAPSULORRAPHY AND PLACEMENT OF ALLODERM;  Surgeon: Glenna Fellows, MD;  Location: [facility];  Service: Plastics;  Laterality: Right;   BREAST RECONSTRUCTION Right 12/12/2014   Procedure: REVISION OF RIGHT RECONSTRUCTIVE BREAST WITH CAPSULORRHAPHY PLACEMENT OF SILCONE IMPLANTS AND LIPOFILLING TO RIGHT CHEST ;  Surgeon: Glenna Fellows, MD;  Location: [facility]  [place];  Service: Plastics;  Laterality: Right;   BREAST RECONSTRUCTION Right 03/06/2015   Procedure: RIGHT NIPPLE AEROLA COMPLEX RECONSTRUCTION AND LOCAL FLAP WITH LIPO FILLING TO RIGHT BREAST,REMOVAL RIGHT CHEST PORT;  Surgeon: Glenna Fellows, MD;  Location: [facility];  Service: Plastics;  Laterality: Right;   BREAST RECONSTRUCTION Right 05/18/2018   Procedure: revision right breast reconstruction with silicone implant exchange;  Surgeon: Glenna Fellows, MD;  Location:  SUR[facility];  Service: Plastics;  Laterality: Right;   BREAST RECONSTRUCTION WITH PLACEMENT OF TISSUE EXPANDER AND FLEX HD (ACELLULAR HYDRATED DERMIS) Right 12/13/2013   Procedure: RIGHT BREAST RECONSTRUCTION WITH PLACEMENT OF TISSUE EXPANDER AND  FLEX HD (ACELLULAR HYDRATED DERMIS);  Surgeon: Glenna Fellows, MD;  Location: [facility];  Service: Plastics;  Laterality: Right;   CHOLECYSTECTOMY  1970's   COLONOSCOPY     LIPOMA EXCISION Bilateral 12/13/2013   Procedure: EXCISION OF LEFT BREAST KELOID AND RIGHT CHEST KELOID;  Surgeon: Glenna Fellows, MD;  Location: [facility];  Service: Plastics;  Laterality: Bilateral;   LIPOSUCTION WITH LIPOFILLING Right 09/26/2014   Procedure: LIPOSUCTION WITH LIPOFILLING;  Surgeon: Glenna Fellows, MD;  Location: [facility];  Service: Plastics;  Laterality: Right;   LIPOSUCTION WITH LIPOFILLING Right 12/12/2014   Procedure: LIPOSUCTION WITH LIPOFILLING;  Surgeon: Glenna Fellows, MD;  Location: [facility];  Service: Plastics;  Laterality: Right;   LIPOSUCTION WITH LIPOFILLING Right 03/06/2015   Procedure: ABDOMINAL LIPOSUCTION WITH LIPOFILLING TO RIGHT BREAST;  Surgeon: Glenna Fellows, MD;  Location: [facility];  Service: Plastics;  Laterality: Right;   MASTECTOMY Right 2015   MASTECTOMY W/ SENTINEL NODE BIOPSY Right 12/13/2013   Procedure: RIGHT TOTAL MASTECTOMY WITH SENTINEL  LYMPH NODE BIOPSY;  Surgeon: Ernestene Mention, MD;  Location: [facility];  Service: General;  Laterality: Right;   MASTOPEXY Left 09/26/2014   Procedure: MASTOPEXY;  Surgeon: Glenna Fellows, MD;  Location: [facility];  Service: Plastics;  Laterality: Left;   PORT-A-CATH REMOVAL Right 03/06/2015   Procedure: REMOVAL PORT-A-CATH;  Surgeon: Glenna Fellows, MD;  Location:  SURGERY CENTER;  Service: Plastics;  Laterality: Right;   PORTACATH PLACEMENT Right 12/13/2013   Procedure: INSERTION PORT-A-CATH;  Surgeon: Ernestene Mention, MD;  Location:  SUR[facility]ERY CENTER;  Service: General;  Laterality: Right;   REDUCTION MAMMAPLASTY Left 2015   REMOVAL OF BILATERAL TISSUE EXPANDERS WITH PLACEMENT OF BILATERAL BREAST IMPLANTS Bilateral 09/26/2014   Procedure: REMOVAL OF RIGHT TISSUE EXPANDER WITH PLACEMENT OF RIGHT BREAST IMPLANT LIPOFILLING TO RIGHT BREAST/LEFT BREAST MASTOPEXY FOR SYMMETRY;  Surgeon: Glenna Fellows, MD;  Location:  SUR[facility]ERY CENTER;  Service: Plastics;  Laterality: Bilateral;   TISSUE EXPANDER PLACEMENT Right 01/10/2014   Procedure: Incision and Drainage Right Breast Seroma with TISSUE EXPANDER exchange;  Surgeon: Glenna Fellows, MD;  Location: [facility];  Service: Plastics;  Laterality: Right;   VAGINAL HYSTERECTOMY  1980   no salpingo-oophoretomu   Allergies  Allergen Reactions   Codeine Hives and Other (See Comments)    Altered mental status, numbness    Diazepam Other (See Comments)    Delusions, hallucinations       Objective:    Physical Exam Vitals and nursing note reviewed.  Constitutional:      Appearance: Normal appearance. She is obese.  Cardiovascular:     Rate and Rhythm: Normal rate and regular rhythm.  Pulmonary:     Effort: Pulmonary effort is normal.     Breath sounds: Normal breath sounds.  Musculoskeletal:        General: Normal range of motion.  Skin:    General: Skin is warm and dry.   Neurological:     Mental Status: She is alert.  Psychiatric:        Mood and Affect: Mood normal.        Behavior: Behavior normal.    BP (!) 163/96 (BP Location: Left Arm, Patient Position: Sitting, Cuff Size: Large)   Pulse 85   Temp 98 F (36.7 C) (Temporal)   Ht 5' (1.524 m)   Wt 155 lb 4 oz (70.4 kg)   SpO2 99%  BMI 30.32 kg/m  Wt Readings from Last 3 Encounters:  09/15/23 155 lb 4 oz (70.4 kg)  05/14/23 156 lb 3.2 oz (70.9 kg)  04/27/23 155 lb 6.4 oz (70.5 kg)       Dulce Sellar, NP

## 2023-09-15 NOTE — Assessment & Plan Note (Signed)
chronic, stable Dexa Scan results reviewed: Spine and hips showed improved numbers in her Osteopenia from last scan 2019. Prolia stopped at that time d/t improved numbers from previous scan which showed Osteoporosis.  -Continue Vitamin D 5,000 units daily and eating a calcium rich diet. -Encourage weight-bearing exercise such as walking when patient's condition allows. -Check bone density results again in 2 years.

## 2023-09-16 ENCOUNTER — Other Ambulatory Visit: Payer: Self-pay

## 2023-09-16 ENCOUNTER — Telehealth: Payer: Self-pay

## 2023-09-16 MED ORDER — VITAMIN D (CHOLECALCIFEROL) 25 MCG (1000 UT) PO CAPS: 25.0000 ug | ORAL_CAPSULE | Freq: Every day | ORAL | Status: AC

## 2023-09-16 NOTE — Telephone Encounter (Signed)
I called pt and lvm in regards.

## 2023-09-16 NOTE — Telephone Encounter (Signed)
-----   Message from Diamond sent at 09/15/2023  8:45 PM EST ----- Regarding: Vitamin D dose Please call Angela Gross and ask her to look at her Vitamin D over the counter bottle and tell us the dose. We have down 10,000 units daily and that sounds too high.  Also ask if she is taking the Losartan 25mg  qd for blood pressure? she may have run out & BP high today. Let me know, thx.

## 2023-09-17 DIAGNOSIS — I10 Essential (primary) hypertension: Secondary | ICD-10-CM | POA: Insufficient documentation

## 2023-09-17 MED ORDER — LOSARTAN POTASSIUM 25 MG PO TABS: 25.0000 mg | ORAL_TABLET | Freq: Every day | ORAL | 1 refills | Status: DC

## 2023-09-17 NOTE — Assessment & Plan Note (Signed)
chronic, intermittent BP high today, ran out of Losartan 25mg  qd pt advised on taking Losartan qd, sending refill f/u in 3 mos

## 2023-09-20 NOTE — Progress Notes (Signed)
potassium level back to normal. Remember to take a potassium pill with a Lasix pill when needed. Kidney function is down again, has to drink more water, up to 8, 8oz cups daily! Avoid NSAIDs. Does she have a 3 month f/u visit scheduled? thx

## 2023-09-21 ENCOUNTER — Ambulatory Visit (INDEPENDENT_AMBULATORY_CARE_PROVIDER_SITE_OTHER): Payer: Medicare Other

## 2023-09-21 ENCOUNTER — Ambulatory Visit: Payer: Medicare Other | Admitting: Family Medicine

## 2023-09-21 VITALS — BP 142/90 | HR 95 | Ht 60.0 in | Wt 161.0 lb

## 2023-09-21 DIAGNOSIS — M5441 Lumbago with sciatica, right side: Secondary | ICD-10-CM

## 2023-09-21 DIAGNOSIS — M25551 Pain in right hip: Secondary | ICD-10-CM

## 2023-09-21 DIAGNOSIS — M25552 Pain in left hip: Secondary | ICD-10-CM

## 2023-09-21 DIAGNOSIS — G620 Drug-induced polyneuropathy: Secondary | ICD-10-CM | POA: Diagnosis not present

## 2023-09-21 DIAGNOSIS — G8929 Other chronic pain: Secondary | ICD-10-CM

## 2023-09-21 DIAGNOSIS — M4316 Spondylolisthesis, lumbar region: Secondary | ICD-10-CM | POA: Diagnosis not present

## 2023-09-21 DIAGNOSIS — M5442 Lumbago with sciatica, left side: Secondary | ICD-10-CM

## 2023-09-21 DIAGNOSIS — T451X5A Adverse effect of antineoplastic and immunosuppressive drugs, initial encounter: Secondary | ICD-10-CM | POA: Diagnosis not present

## 2023-09-21 MED ORDER — PREDNISONE 50 MG PO TABS
ORAL_TABLET | ORAL | 0 refills | Status: DC
Start: 2023-09-21 — End: 2023-10-26

## 2023-09-21 NOTE — Patient Instructions (Addendum)
Thank you for coming in today.   Please get an Xray today before you leave   I've referred you to Physical Therapy.  Let us know if you don't hear from them in one week.   I've sent a prescription for Prednisone to your pharmacy.   Check back in 1 month

## 2023-09-21 NOTE — Progress Notes (Unsigned)
Rubin Payor, PhD, LAT, ATC acting as a scribe for Clementeen Graham, MD.  Angela Gross is a 72 y.o. female who presents to [facility] today for L hip and lower leg pain.   Bila hip pain ongoing for about 2.5 wks. Pt locates pain to the anterior aspect and into the groin. She notes a hx of OA in her low back. Low back pain is across both sides and into L buttock.   Radiates: yes- bilat anterior thighs and lower legs Aggravates: increased activity Treatments tried: Tylenol, Lyrica  Pertinent review of systems: No fevers or chills  Relevant historical information: Chemotherapy induced neuropathy.  Chronic kidney disease.  Rheumatoid arthritis.   Exam:  BP (!) 142/90   Pulse 95   Ht 5' (1.524 m)   Wt 161 lb (73 kg)   SpO2 97%   BMI 31.44 kg/m  General: Well Developed, well nourished, and in no acute distress.   MSK: L-spine normal appearing Nontender palpation spinal midline. Normal lumbar motion. Lower extremity strength is intact.  Reflexes are intact. Pulses are intact distally.  Hips bilaterally normal motion hip abduction strength is mildly diminished.  Nontender palpation bilateral greater trochanter.   Lab and Radiology Results  X-ray images lumbar spine and bilateral hips obtained today personally and independently interpreted.  Lumbar spine: No acute fractures.  No severe degenerative changes present.  Bilateral hips: Mild bilateral hip osteoarthritis.  Pubic symphysis sclerosis is present.  No acute fractures are visible.  Await formal radiology review     Assessment and Plan: 72 y.o. female with chronic low back pain hip pain and radiating pain down the leg.  Pain and multifactorial.  Plan for physical therapy trial and a short course of prednisone. Recheck back in 1 month. She does have known neuropathy secondary to chemotherapy.  If not improved consider MRI and ultimately nerve conduction study.  PDMP not reviewed  this encounter. Orders Placed This Encounter  Procedures   DG Lumbar Spine 2-3 Views    Standing Status:   Future    Number of Occurrences:   1    Standing Expiration Date:   10/22/2023    Order Specific Question:   Reason for Exam (SYMPTOM  OR DIAGNOSIS REQUIRED)    Answer:   low back pain    Order Specific Question:   Preferred imaging location?    Answer:   [facility]   DG HIPS BILAT W OR W/O PELVIS MIN 5 VIEWS    Standing Status:   Future    Number of Occurrences:   1    Standing Expiration Date:   09/20/2024    Order Specific Question:   Reason for Exam (SYMPTOM  OR DIAGNOSIS REQUIRED)    Answer:   bilateral hip pain    Order Specific Question:   Preferred imaging location?    Answer:   [facility]   Ambulatory referral to Physical Therapy    Referral Priority:   Routine    Referral Type:   Physical Medicine    Referral Reason:   Specialty Services Required    Requested Specialty:   Physical Therapy    Number of Visits Requested:   1   Meds ordered this encounter  Medications   predniSONE (DELTASONE) 50 MG tablet    Sig: Take 1 pill daily for 5 days    Dispense:  5 tablet    Refill:  0     Discussed  warning signs or symptoms. Please see discharge instructions. Patient expresses understanding.   The above documentation has been reviewed and is accurate and complete Clementeen Graham, M.D.

## 2023-09-30 NOTE — Therapy (Deleted)
OUTPATIENT PHYSICAL THERAPY THORACOLUMBAR EVALUATION   Patient Name: Angela Gross MRN: [id number] DOB:Dec 09, 1950, 72 y.o., female Today's Date: 09/30/2023  END OF SESSION:   Past Medical History:  Diagnosis Date   Anxiety    Breast cancer (HCC)    "right" (01/10/2014)   Chronic left shoulder pain    "work related injury" (01/10/2014)   Constipation 10/04/2014   Degenerative disc disease, cervical    Fibromyalgia    Full dentures    History of breast cancer in female 10/25/2013   Hot flashes, menopausal 04/04/2014   Hypertension    Osteoarthritis of both knees    Osteoporosis    Personal history of chemotherapy    PONV (postoperative nausea and vomiting)    Raynaud disease    Rheumatoid arthritis (HCC)    "elbows; fingers; toes"   Syncope and collapse    "4 times in the last year" (01/10/2014)   Wears glasses    Past Surgical History:  Procedure Laterality Date   BREAST BIOPSY Right 10/2013   BREAST BIOPSY Left 07/22/2023   MM LT BREAST BX W LOC DEV 1ST LESION IMAGE BX SPEC STEREO GUIDE 07/22/2023 [facility]   BREAST BIOPSY Left 07/22/2023   MM LT BREAST BX W LOC DEV EA AD LESION IMG BX SPEC STEREO GUIDE 07/22/2023 [facility]   BREAST IMPLANT EXCHANGE Right 02/12/2016   Procedure: EXCHANGE SILICONE IMPLANT ON RIGHT, CAPSULORRAPHY AND PLACEMENT OF ALLODERM;  Surgeon: Glenna Fellows, MD;  Location: [facility];  Service: Plastics;  Laterality: Right;   BREAST RECONSTRUCTION Right 12/12/2014   Procedure: REVISION OF RIGHT RECONSTRUCTIVE BREAST WITH CAPSULORRHAPHY PLACEMENT OF SILCONE IMPLANTS AND LIPOFILLING TO RIGHT CHEST ;  Surgeon: Glenna Fellows, MD;  Location: [facility];  Service: Plastics;  Laterality: Right;   BREAST RECONSTRUCTION Right 03/06/2015   Procedure: RIGHT NIPPLE AEROLA COMPLEX RECONSTRUCTION AND LOCAL FLAP WITH LIPO FILLING TO RIGHT BREAST,REMOVAL RIGHT CHEST PORT;  Surgeon: Glenna Fellows, MD;  Location: [facility];  Service: Plastics;  Laterality: Right;   BREAST RECONSTRUCTION Right 05/18/2018   Procedure: revision right breast reconstruction with silicone implant exchange;  Surgeon: Glenna Fellows, MD;  Location: [facility];  Service: Plastics;  Laterality: Right;   BREAST RECONSTRUCTION WITH PLACEMENT OF TISSUE EXPANDER AND FLEX HD (ACELLULAR HYDRATED DERMIS) Right 12/13/2013   Procedure: RIGHT BREAST RECONSTRUCTION WITH PLACEMENT OF TISSUE EXPANDER AND FLEX HD (ACELLULAR HYDRATED DERMIS);  Surgeon: Glenna Fellows, MD;  Location: [facility];  Service: Plastics;  Laterality: Right;   CHOLECYSTECTOMY  1970's   COLONOSCOPY     LIPOMA EXCISION Bilateral 12/13/2013   Procedure: EXCISION OF LEFT BREAST KELOID AND RIGHT CHEST KELOID;  Surgeon: Glenna Fellows, MD;  Location: [facility];  Service: Plastics;  Laterality: Bilateral;   LIPOSUCTION WITH LIPOFILLING Right 09/26/2014   Procedure: LIPOSUCTION WITH LIPOFILLING;  Surgeon: Glenna Fellows, MD;  Location: [facility];  Service: Plastics;  Laterality: Right;   LIPOSUCTION WITH LIPOFILLING Right 12/12/2014   Procedure: LIPOSUCTION WITH LIPOFILLING;  Surgeon: Glenna Fellows, MD;  Location: [facility];  Service: Plastics;  Laterality: Right;   LIPOSUCTION WITH LIPOFILLING Right 03/06/2015   Procedure: ABDOMINAL LIPOSUCTION WITH LIPOFILLING TO RIGHT BREAST;  Surgeon: Glenna Fellows, MD;  [facility];  Service: Plastics;  Laterality: Right;   MASTECTOMY Right 2015   MASTECTOMY W/ SENTINEL NODE BIOPSY Right 12/13/2013   Procedure: RIGHT TOTAL MASTECTOMY WITH SENTINEL LYMPH NODE BIOPSY;  Surgeon: Ernestene Mention, MD;  Location: [facility];  Service: General;  Laterality: Right;   MASTOPEXY Left 09/26/2014   Procedure: MASTOPEXY;  Surgeon: Glenna Fellows, MD;  Location: [facility];  Service: Plastics;  Laterality:  Left;   PORT-A-CATH REMOVAL Right 03/06/2015   Procedure: REMOVAL PORT-A-CATH;  Surgeon: Glenna Fellows, MD;  Location: [facility];  Service: Plastics;  Laterality: Right;   PORTACATH PLACEMENT Right 12/13/2013   Procedure: INSERTION PORT-A-CATH;  Surgeon: Ernestene Mention, MD;  Location: [facility];  Service: General;  Laterality: Right;   REDUCTION MAMMAPLASTY Left 2015   REMOVAL OF BILATERAL TISSUE EXPANDERS WITH PLACEMENT OF BILATERAL BREAST IMPLANTS Bilateral 09/26/2014   Procedure: REMOVAL OF RIGHT TISSUE EXPANDER WITH PLACEMENT OF RIGHT BREAST IMPLANT LIPOFILLING TO RIGHT BREAST/LEFT BREAST MASTOPEXY FOR SYMMETRY;  Surgeon: Glenna Fellows, MD;  Location: [facility];  Service: Plastics;  Laterality: Bilateral;   TISSUE EXPANDER PLACEMENT Right 01/10/2014   Procedure: Incision and Drainage Right Breast Seroma with TISSUE EXPANDER exchange;  Surgeon: Glenna Fellows, MD;  Location: [facility];  Service: Plastics;  Laterality: Right;   VAGINAL HYSTERECTOMY  1980   no [id number]-[id number]   Patient Active Problem List   Diagnosis Date Noted   Essential (primary) hypertension 09/17/2023   Osteopenia of lumbar spine 09/15/2023   Elevated blood pressure reading without diagnosis of hypertension 09/15/2023   Mixed hyperlipidemia 04/27/2023   Chronic gout of foot 02/11/2023   Chronic kidney disease, stage 3b (HCC) 02/11/2023   Borderline diabetes 01/25/2023   Osteopenia of both hips 02/27/2015   Personal history of malignant neoplasm of breast 09/26/2014   Rheumatoid arthritis (HCC) 06/27/2014   Chemotherapy induced cardiomyopathy (HCC) 05/11/2014   Chemotherapy-induced neuropathy (HCC) 03/14/2014    PCP: Dulce Sellar, NP  REFERRING PROVIDER: Rodolph Bong, MD  REFERRING DIAG: .M54.42,M54.41,G89.29 (ICD-10-CM) - Chronic bilateral low back pain with bilateral sciatica  Rationale for Evaluation and Treatment: Rehabilitation  THERAPY DIAG:   No diagnosis found.  ONSET DATE: ***  SUBJECTIVE:                                                                                                                                                                                           SUBJECTIVE STATEMENT: ***   L hip and lower leg pain.    Bila hip pain ongoing for about 2.5 wks. Pt locates pain to the anterior aspect and into the groin. She notes a hx of OA in her low back. Low back pain is across both sides and into L buttock.    Radiates: yes- bilat anterior thighs  and lower legs Aggravates: increased activity Treatments tried: Tylenol, Lyrica  PERTINENT HISTORY:  ***anxiety, fibromyalgia, HTN, RA, Hx R breast cancer 2015  PAIN:  Are you having pain? Yes: NPRS scale: *** Pain location: *** Pain description: *** Aggravating factors: *** Relieving factors: ***  PRECAUTIONS: {Therapy precautions:24002}  RED FLAGS: {PT Red Flags:29287}   WEIGHT BEARING RESTRICTIONS: {Yes ***/No:24003}  FALLS:  Has patient fallen in last 6 months? {fallsyesno:27318}  LIVING ENVIRONMENT: Lives with: {[facility] lives with:25569::"lives with their family"} Lives in: {Lives in:25570} Stairs: {opstairs:27293} Has following equipment at home: {Assistive devices:23999}  OCCUPATION: ***  PLOF: {PLOF:24004}  PATIENT GOALS: ***  NEXT MD VISIT: ***  OBJECTIVE:  Note: Objective measures were completed at Evaluation unless otherwise noted.  DIAGNOSTIC FINDINGS:  09/21/23 B hip xray FINDINGS: There is no evidence of hip fracture or dislocation. There is no evidence of arthropathy or other focal bone abnormality.   IMPRESSION: Negative.  PATIENT SURVEYS:  {rehab surveys:24030}  COGNITION: Overall cognitive status: {cognition:24006}     SENSATION: {sensation:27233}  MUSCLE LENGTH: Hamstrings: Right *** deg; Left *** deg Maisie Fus test: Right *** deg; Left *** deg  POSTURE: {posture:25561}  PALPATION: ***  LUMBAR ROM:   AROM  eval  Flexion   Extension   Right lateral flexion   Left lateral flexion   Right rotation   Left rotation    (Blank rows = not tested)    LE Measurements Lower Extremity Right EVAL Left EVAL   A/PROM MMT A/PROM MMT  Hip Flexion      Hip Extension      Hip Abduction      Hip Adduction      Hip Internal rotation      Hip External rotation      Knee Flexion      Knee Extension      Ankle Dorsiflexion      Ankle Plantarflexion      Ankle Inversion      Ankle Eversion       (Blank rows = not tested) * pain   LUMBAR SPECIAL TESTS:  {lumbar special test:25242}  FUNCTIONAL TESTS:  {Functional tests:24029}  GAIT: Distance walked: *** Assistive device utilized: {Assistive devices:23999} Level of assistance: {Levels of assistance:24026} Comments: ***  TREATMENT DATE: ***                                                                                                                             09/30/2023  Therapeutic Exercise:  Aerobic: Supine: Prone:  Seated:  Standing: Neuromuscular Re-education: Manual Therapy: Therapeutic Activity: Self Care: Trigger Point Dry Needling:  Modalities:     PATIENT EDUCATION:  Education details: on current presentation, on HEP, on clinical outcomes score and POC Person educated: Patient Education method: Explanation, Demonstration, and Handouts Education comprehension: verbalized understanding   HOME EXERCISE PROGRAM: ***  ASSESSMENT:  CLINICAL IMPRESSION: Patient is a *** y.o. *** who was seen today for physical therapy evaluation and treatment for ***.  OBJECTIVE IMPAIRMENTS: {opptimpairments:25111}.   ACTIVITY LIMITATIONS: {activitylimitations:27494}  PARTICIPATION LIMITATIONS: {participationrestrictions:25113}  PERSONAL FACTORS: {Personal factors:25162} are also affecting patient's functional outcome.   REHAB POTENTIAL: {rehabpotential:25112}  CLINICAL DECISION MAKING: {clinical decision  making:25114}  EVALUATION COMPLEXITY: {Evaluation complexity:25115}   GOALS: Goals reviewed with patient? yes  SHORT TERM GOALS: Target date: {follow up:25551} *** MAKE TEXT EDITABLE Patient will be independent in self management strategies to improve quality of life and functional outcomes. Baseline: New Program Goal status: INITIAL  2.  Patient will report at least 50% improvement in overall symptoms and/or function to demonstrate improved functional mobility Baseline: 0% better Goal status: INITIAL  3.  *** Baseline:  Goal status: INITIAL  4.  *** Baseline:  Goal status: INITIAL    LONG TERM GOALS: Target date: {follow up:25551} *** MAKE TEXT EDITABLE  Patient will report at least 75% improvement in overall symptoms and/or function to demonstrate improved functional mobility Baseline: 0% better Goal status: INITIAL  2.  Patient will improve score on FOTO outcomes measure to projected score to demonstrate overall improved function and QOL Baseline: see above Goal status: INITIAL  3.  *** Baseline:  Goal status: INITIAL  4.  *** Baseline:  Goal status: INITIAL   PLAN:  PT FREQUENCY: {rehab frequency:25116}  PT DURATION: {rehab duration:25117}  PLANNED INTERVENTIONS: 97110-Therapeutic exercises, 97530- Therapeutic activity, 97112- Neuromuscular re-education, 97535- Self Care, 11914- Manual therapy, [id number]- Gait training, [phone]- Orthotic Fit/training, [phone]- Canalith repositioning, [id number]- Aquatic Therapy, 97014- Electrical stimulation (unattended), [phone]- Ionotophoresis 4mg /ml Dexamethasone, Patient/Family education, Balance training, Stair training, Taping, Dry Needling, Joint mobilization, Joint manipulation, Spinal manipulation, Spinal mobilization, Cryotherapy, and Moist heat   PLAN FOR NEXT SESSION: ***  11:59 AM, 09/30/23 Tereasa Coop, DPT Physical Therapy with Dolores Lory

## 2023-09-30 NOTE — Progress Notes (Signed)
Hip x-ray looks okay to radiology.  No significant arthritis.  No broken bones.

## 2023-09-30 NOTE — Progress Notes (Signed)
Low back x-ray shows some arthritis at L4-5.

## 2023-10-03 ENCOUNTER — Other Ambulatory Visit: Payer: Self-pay | Admitting: Family

## 2023-10-03 DIAGNOSIS — M1A0790 Idiopathic chronic gout, unspecified ankle and foot, without tophus (tophi): Secondary | ICD-10-CM

## 2023-10-05 ENCOUNTER — Ambulatory Visit: Payer: Medicare Other | Admitting: Physical Therapy

## 2023-10-12 NOTE — Therapy (Signed)
 OUTPATIENT PHYSICAL THERAPY THORACOLUMBAR EVALUATION   Patient Name: Angela Gross MRN: [id number] DOB:August 06, 1951, 72 y.o., female Today's Date: 10/20/2023  END OF SESSION:  PT End of Session - 10/19/23 1354     Visit Number 1    Number of Visits 16    Date for PT Re-Evaluation 01/11/24    Authorization Type [employer] medicare - auth submitted    PT Start Time 1354    PT Stop Time 1428    PT Time Calculation (min) 34 min    Activity Tolerance Patient tolerated treatment well    Behavior During Therapy [facility] for tasks assessed/performed             Past Medical History:  Diagnosis Date   Anxiety    Breast cancer (HCC)    right (01/10/2014)   Chronic left shoulder pain    work related injury (01/10/2014)   Constipation 10/04/2014   Degenerative disc disease, cervical    Fibromyalgia    Full dentures    History of breast cancer in female 10/25/2013   Hot flashes, menopausal 04/04/2014   Hypertension    Osteoarthritis of both knees    Osteoporosis    Personal history of chemotherapy    PONV (postoperative nausea and vomiting)    Raynaud disease    Rheumatoid arthritis (HCC)    elbows; fingers; toes   Syncope and collapse    4 times in the last year (01/10/2014)   Wears glasses    Past Surgical History:  Procedure Laterality Date   BREAST BIOPSY Right 10/2013   BREAST BIOPSY Left 07/22/2023   MM LT BREAST BX W LOC DEV 1ST LESION IMAGE BX SPEC STEREO GUIDE 07/22/2023 [facility]   BREAST BIOPSY Left 07/22/2023   MM LT BREAST BX W LOC DEV EA AD LESION IMG BX SPEC STEREO GUIDE 07/22/2023 [facility]   BREAST IMPLANT EXCHANGE Right 02/12/2016   Procedure: EXCHANGE SILICONE IMPLANT ON RIGHT, CAPSULORRAPHY AND PLACEMENT OF ALLODERM;  Surgeon: Earlis Ranks, MD;  Location: [facility];  Service: Plastics;  Laterality: Right;   BREAST RECONSTRUCTION Right 12/12/2014   Procedure: REVISION OF RIGHT RECONSTRUCTIVE BREAST WITH CAPSULORRHAPHY PLACEMENT  OF SILCONE IMPLANTS AND LIPOFILLING TO RIGHT CHEST ;  Surgeon: Earlis Ranks, MD;  Location: [facility];  Service: Plastics;  Laterality: Right;   BREAST RECONSTRUCTION Right 03/06/2015   Procedure: RIGHT NIPPLE AEROLA COMPLEX RECONSTRUCTION AND LOCAL FLAP WITH LIPO FILLING TO RIGHT BREAST,REMOVAL RIGHT CHEST PORT;  Surgeon: Earlis Ranks, MD;  Location: [facility];  Service: Plastics;  Laterality: Right;   BREAST RECONSTRUCTION Right 05/18/2018   Procedure: revision right breast reconstruction with silicone implant exchange;  Surgeon: Ranks Earlis, MD;  Location: [facility];  Service: Plastics;  Laterality: Right;   BREAST RECONSTRUCTION WITH PLACEMENT OF TISSUE EXPANDER AND FLEX HD (ACELLULAR HYDRATED DERMIS) Right 12/13/2013   Procedure: RIGHT BREAST RECONSTRUCTION WITH PLACEMENT OF TISSUE EXPANDER AND FLEX HD (ACELLULAR HYDRATED DERMIS);  Surgeon: Earlis Ranks, MD;  Location: [facility];  Service: Plastics;  Laterality: Right;   CHOLECYSTECTOMY  1970's   COLONOSCOPY     LIPOMA EXCISION Bilateral 12/13/2013   Procedure: EXCISION OF LEFT BREAST KELOID AND RIGHT CHEST KELOID;  Surgeon: Earlis Ranks, MD;  Location:  SURGERY CENTER;  Service: Plastics;  Laterality: Bilateral;   LIPOSUCTION WITH LIPOFILLING Right 09/26/2014   Procedure: LIPOSUCTION WITH LIPOFILLING;  Surgeon: Earlis Ranks, MD;  Location:  SURGERY CENTER;  Service: Plastics;  Laterality: Right;   LIPOSUCTION WITH LIPOFILLING Right 12/12/2014   Procedure: LIPOSUCTION WITH LIPOFILLING;  Surgeon: Earlis Ranks, MD;  Location: [facility];  Service: Plastics;  Laterality: Right;   LIPOSUCTION WITH LIPOFILLING Right 03/06/2015   Procedure: ABDOMINAL LIPOSUCTION WITH LIPOFILLING TO RIGHT BREAST;  Surgeon: Earlis Ranks, MD;  Location: [facility];  Service: Plastics;  Laterality: Right;   MASTECTOMY Right 2015    MASTECTOMY W/ SENTINEL NODE BIOPSY Right 12/13/2013   Procedure: RIGHT TOTAL MASTECTOMY WITH SENTINEL LYMPH NODE BIOPSY;  Surgeon: Elon CHRISTELLA Pacini, MD;  Location: [facility];  Service: General;  Laterality: Right;   MASTOPEXY Left 09/26/2014   Procedure: MASTOPEXY;  Surgeon: Earlis Ranks, MD;  Location: [facility];  Service: Plastics;  Laterality: Left;   PORT-A-CATH REMOVAL Right 03/06/2015   Procedure: REMOVAL PORT-A-CATH;  Surgeon: Earlis Ranks, MD;  Location: [facility];  Service: Plastics;  Laterality: Right;   PORTACATH PLACEMENT Right 12/13/2013   Procedure: INSERTION PORT-A-CATH;  Surgeon: Elon CHRISTELLA Pacini, MD;  Location: [facility];  Service: General;  Laterality: Right;   REDUCTION MAMMAPLASTY Left 2015   REMOVAL OF BILATERAL TISSUE EXPANDERS WITH PLACEMENT OF BILATERAL BREAST IMPLANTS Bilateral 09/26/2014   Procedure: REMOVAL OF RIGHT TISSUE EXPANDER WITH PLACEMENT OF RIGHT BREAST IMPLANT LIPOFILLING TO RIGHT BREAST/LEFT BREAST MASTOPEXY FOR SYMMETRY;  Surgeon: Earlis Ranks, MD;  Location: [facility];  Service: Plastics;  Laterality: Bilateral;   TISSUE EXPANDER PLACEMENT Right 01/10/2014   Procedure: Incision and Drainage Right Breast Seroma with TISSUE EXPANDER exchange;  Surgeon: Earlis Ranks, MD;  Location: [facility];  Service: Plastics;  Laterality: Right;   VAGINAL HYSTERECTOMY  1980   no [id number]   Patient Active Problem List   Diagnosis Date Noted   Essential (primary) hypertension 09/17/2023   Osteopenia of lumbar spine 09/15/2023   Elevated blood pressure reading without diagnosis of hypertension 09/15/2023   Mixed hyperlipidemia 04/27/2023   Chronic gout of foot 02/11/2023   Chronic kidney disease, stage 3b (HCC) 02/11/2023   Borderline diabetes 01/25/2023   Osteopenia of both hips 02/27/2015   Personal history of malignant neoplasm of breast 09/26/2014   Rheumatoid arthritis  (HCC) 06/27/2014   Chemotherapy induced cardiomyopathy (HCC) 05/11/2014   Chemotherapy-induced neuropathy (HCC) 03/14/2014    PCP: Lucius Krabbe, NP  REFERRING PROVIDER: Joane Artist RAMAN, MD  REFERRING DIAG: .M54.42,M54.41,G89.29 (ICD-10-CM) - Chronic bilateral low back pain with bilateral sciatica  Rationale for Evaluation and Treatment: Rehabilitation  THERAPY DIAG:  Other low back pain  Muscle weakness (generalized)  Chronic pain of both shoulders  ONSET DATE: Nov 2023  SUBJECTIVE:  SUBJECTIVE STATEMENT: States that she has RA and she has had hip and back pain intermittently. States that there are days when she can hardly move. States that she has had neuropathy numbness in her hands and feet and this is going on today.   Works part time 3hrs a day with a client for [employer].    States the pain in November got worse. Some days are better than others. States heat takes the edge off. Ice hurts. Currently taking lyrical, tylenol , and was on prednisone .  Reports her shoulders have been hurting for  her off on and has difficutly washing hair.  Left arm is worse than the right. Patient is Right handed   PERTINENT HISTORY:  anxiety, fibromyalgia, HTN, RA, Hx R breast cancer 2015  PAIN:  Are you having pain? Yes: NPRS scale: 6 Pain location: b hip pain Pain description: burning Aggravating factors: sitting for long periods of time Relieving factors: heat  PRECAUTIONS: None  RED FLAGS: None   WEIGHT BEARING RESTRICTIONS: No  FALLS:  Has patient fallen in last 6 months? No     OCCUPATION: aide - part time  PLOF: Independent  PATIENT GOALS: to have less pain  NEXT MD VISIT: next week  OBJECTIVE:  Note: Objective measures were completed at Evaluation unless otherwise  noted.  DIAGNOSTIC FINDINGS:  09/21/23 B hip xray FINDINGS: There is no evidence of hip fracture or dislocation. There is no evidence of arthropathy or other focal bone abnormality.   IMPRESSION: Negative.  PATIENT SURVEYS:  FOTO 24%  COGNITION: Overall cognitive status: Within functional limits for tasks assessed     SENSATION: Not tested    POSTURE:  grips glutes and standing, hips externally rotated and standing knees hyperextended with excessive lumbar lordosis noted  PALPATION: Tenderness to palpation along bilateral hips  LUMBAR ROM:   AROM eval  Flexion 50% limited  Extension 75% limited *  Right lateral flexion 75% limited *  Left lateral flexion 50% limited   Right rotation   Left rotation    (Blank rows = not tested)    UE Measurements Upper Extremity left EVAL Right  EVAL   A/PROM MMT A/PROM MMT  Shoulder Flexion /160*  /160*   Shoulder Extension      Shoulder Abduction /170*  /170*   Shoulder Adduction      Shoulder Internal Rotation /70*  /45*   Shoulder External Rotation /70*  /60*   Elbow Flexion      Elbow Extension      Wrist Flexion      Wrist Extension      Wrist Supination      Wrist Pronation      Wrist Ulnar Deviation      Wrist Radial Deviation      Grip Strength NA  NA     (Blank rows = not tested)   * pain   LE Measurements Lower Extremity Right EVAL Left EVAL   A/PROM MMT A/PROM MMT  Hip Flexion WFL*  WFL*   Hip Extension      Hip Abduction      Hip Adduction      Hip Internal rotation (tested supine) /15*  /10*   Hip External rotation (tested supine) /35*  /30*   Knee Flexion      Knee Extension      Ankle Dorsiflexion      Ankle Plantarflexion      Ankle Inversion      Ankle Eversion       (  Blank rows = not tested) * pain   LUMBAR SPECIAL TESTS:  L slump + , - SLR B  FUNCTIONAL TESTS:  Sit to stand: Bilateral use of upper extremities, pain, slow transitional movement  GAIT: Distance walked: 25 feet  within clinic Assistive device utilized: None Level of assistance: Modified independence Comments: Wide base of support, Limited hip mobility, slow labored movements  TREATMENT DATE:                                                                                                                               10/20/2023  Therapeutic Exercise:  Aerobic: Supine: ROM of hip and shoulders gentle-educated importance of gentle motion not going into pain 5 minutes Prone:  Seated:  Standing: Neuromuscular Re-education: Manual Therapy: Vibration therapy with percussion gun and use of towel to left upper extremity and left hip 5 minutes  Therapeutic Activity: Self Care: Trigger Point Dry Needling:  Modalities: thermotherapy to lumbar and cervical spine in supine doing treatment session    PATIENT EDUCATION:  Education details: on current presentation, on HEP, on clinical outcomes score and POC, on importance of mobility but gentle and gradual not going into pain, importance of pain management strategies, and using braces as needed at work but continuing to gently move joints to help with stiffness and pain Person educated: Patient Education method: Programmer, Multimedia, Facilities Manager, and Handouts Education comprehension: verbalized understanding   HOME EXERCISE PROGRAM: [id number]  ASSESSMENT:  CLINICAL IMPRESSION: Patient presents to physical therapy with complaints of low back pain, bilateral hip and leg pain as well as bilateral shoulder pain.  Symptoms have no mechanism of injury patient has been mobilizing with braces and using heat intermittently.  Pain has gradually increased over the last few weeks and was recommended to come here to help with her pain.  Patient has difficulties washing her hair, transitional movements in and out of chairs and walking around when her pain is really bad.  Patient presents with weakness, range of motion deficits and muscle guarding that is likely contributing to  current presentation.  It is noted that upon end of session patient had improved transitional mobility and reported significantly less pain.  Patient would greatly benefit from skilled PT to improve overall function and quality of life.   OBJECTIVE IMPAIRMENTS: Abnormal gait, decreased activity tolerance, decreased balance, decreased mobility, difficulty walking, decreased ROM, decreased strength, impaired UE functional use, improper body mechanics, prosthetic dependency , and pain.   ACTIVITY LIMITATIONS: carrying, lifting, sitting, standing, squatting, stairs, transfers, bed mobility, dressing, reach over head, hygiene/grooming, locomotion level, and caring for others  PARTICIPATION LIMITATIONS: meal prep, cleaning, community activity, and occupation  PERSONAL FACTORS: Age, Fitness, and 3+ comorbidities: Bilateral neuropathy in hands and feet, RA, chronic knee pain  are also affecting patient's functional outcome.   REHAB POTENTIAL: Good  CLINICAL DECISION MAKING: Stable/uncomplicated  EVALUATION COMPLEXITY: Low   GOALS: Goals reviewed with patient? yes  SHORT TERM GOALS: Target  date: 11/30/2023  Patient will be independent in self management strategies to improve quality of life and functional outcomes. Baseline: New Program Goal status: INITIAL  2.  Patient will report at least 50% improvement in overall symptoms and/or function to demonstrate improved functional mobility Baseline: 0% better Goal status: INITIAL  3.  Patient will be able to transition from sit to stand without the use of her hands and without pain to improve transitional movements Baseline: Unable Goal status: INITIAL     LONG TERM GOALS: Target date: 01/11/2024   Patient will report at least 75% improvement in overall symptoms and/or function to demonstrate improved functional mobility Baseline: 0% better Goal status: INITIAL  2.  Patient will improve score on FOTO outcomes measure to projected score to  demonstrate overall improved function and QOL Baseline: see above Goal status: INITIAL  3.  Patient will demonstrate pain-free shoulder range of motion in both shoulders in all directions Baseline: Painful Goal status: INITIAL  4.  Patient will demonstrate pain-free hip range of motion in all directions in both hips Baseline: Painful Goal status: INITIAL   PLAN:  PT FREQUENCY: 1-2x/week for total of 16 visits over 12 weeks certification period  PT DURATION: 12 weeks  PLANNED INTERVENTIONS: 97110-Therapeutic exercises, 97530- Therapeutic activity, 97112- Neuromuscular re-education, 97535- Self Care, [id number]- Manual therapy, [phone]- Gait training, [phone]- Orthotic Fit/training, [phone]- Canalith repositioning, [id number]- Aquatic Therapy, [id number]- Electrical stimulation (unattended), [phone]- Ionotophoresis 4mg /ml Dexamethasone , Patient/Family education, Balance training, Stair training, Taping, Dry Needling, Joint mobilization, Joint manipulation, Spinal manipulation, Spinal mobilization, Cryotherapy, and Moist heat   PLAN FOR NEXT SESSION: Assess balance, pain management strategies with heat and vibration, gentle range of motion exercises, strengthening once able  8:16 AM, 10/20/23 Olivia Church, DPT Physical Therapy with [place]

## 2023-10-19 ENCOUNTER — Ambulatory Visit: Payer: Medicare Other | Admitting: Physical Therapy

## 2023-10-19 ENCOUNTER — Encounter: Payer: Self-pay | Admitting: Physical Therapy

## 2023-10-19 ENCOUNTER — Ambulatory Visit: Payer: Medicare Other | Admitting: Family Medicine

## 2023-10-19 DIAGNOSIS — M5459 Other low back pain: Secondary | ICD-10-CM

## 2023-10-19 DIAGNOSIS — M6281 Muscle weakness (generalized): Secondary | ICD-10-CM

## 2023-10-19 DIAGNOSIS — G8929 Other chronic pain: Secondary | ICD-10-CM

## 2023-10-19 NOTE — Progress Notes (Deleted)
   LILLETTE Ileana Collet, PhD, LAT, ATC acting as a scribe for Artist Lloyd, MD.  Kaliana Albino is a 73 y.o. female who presents to [facility] today for f/u low back and bilat hip pain w/ radicular symptoms in LE. Pt was last seen by Dr. Lloyd on 09/21/23 and was prescribed prednisone  and referred to PT, but canceled/no-showed visits x 2.   Today, pt reports ***  Dx imaging: 09/21/23 L-spine & bilat hip/pelvis XR  Pertinent review of systems: ***  Relevant historical information: ***   Exam:  There were no vitals taken for this visit. General: Well Developed, well nourished, and in no acute distress.   MSK: ***    Lab and Radiology Results No results found for this or any previous visit (from the past 72 hours). No results found.     Assessment and Plan: 73 y.o. female with ***   PDMP not reviewed this encounter. No orders of the defined types were placed in this encounter.  No orders of the defined types were placed in this encounter.    Discussed warning signs or symptoms. Please see discharge instructions. Patient expresses understanding.   ***

## 2023-10-21 ENCOUNTER — Ambulatory Visit: Payer: Medicare Other | Admitting: Physical Therapy

## 2023-10-21 ENCOUNTER — Encounter: Payer: Self-pay | Admitting: Physical Therapy

## 2023-10-21 DIAGNOSIS — M5459 Other low back pain: Secondary | ICD-10-CM | POA: Diagnosis not present

## 2023-10-21 DIAGNOSIS — M25511 Pain in right shoulder: Secondary | ICD-10-CM

## 2023-10-21 DIAGNOSIS — M6281 Muscle weakness (generalized): Secondary | ICD-10-CM | POA: Diagnosis not present

## 2023-10-21 DIAGNOSIS — M25512 Pain in left shoulder: Secondary | ICD-10-CM | POA: Diagnosis not present

## 2023-10-21 DIAGNOSIS — G8929 Other chronic pain: Secondary | ICD-10-CM

## 2023-10-21 NOTE — Therapy (Signed)
 OUTPATIENT PHYSICAL THERAPY THORACOLUMBAR Treatment   Patient Name: Bernetta Sutley MRN: [id number] DOB:02-Nov-1950, 73 y.o., female Today's Date: 10/21/2023  END OF SESSION:  PT End of Session - 10/21/23 1213     Visit Number 2    Number of Visits 16    Date for PT Re-Evaluation 01/11/24    Authorization Type [employer] medicare - auth submitted    PT Start Time 1216    PT Stop Time 1257    PT Time Calculation (min) 41 min    Activity Tolerance Patient tolerated treatment well    Behavior During Therapy [facility] for tasks assessed/performed             Past Medical History:  Diagnosis Date   Anxiety    Breast cancer (HCC)    right (01/10/2014)   Chronic left shoulder pain    work related injury (01/10/2014)   Constipation 10/04/2014   Degenerative disc disease, cervical    Fibromyalgia    Full dentures    History of breast cancer in female 10/25/2013   Hot flashes, menopausal 04/04/2014   Hypertension    Osteoarthritis of both knees    Osteoporosis    Personal history of chemotherapy    PONV (postoperative nausea and vomiting)    Raynaud disease    Rheumatoid arthritis (HCC)    elbows; fingers; toes   Syncope and collapse    4 times in the last year (01/10/2014)   Wears glasses    Past Surgical History:  Procedure Laterality Date   BREAST BIOPSY Right 10/2013   BREAST BIOPSY Left 07/22/2023   MM LT BREAST BX W LOC DEV 1ST LESION IMAGE BX SPEC STEREO GUIDE 07/22/2023 [facility]   BREAST BIOPSY Left 07/22/2023   MM LT BREAST BX W LOC DEV EA AD LESION IMG BX SPEC STEREO GUIDE 07/22/2023 [facility]   BREAST IMPLANT EXCHANGE Right 02/12/2016   Procedure: EXCHANGE SILICONE IMPLANT ON RIGHT, CAPSULORRAPHY AND PLACEMENT OF ALLODERM;  Surgeon: Earlis Ranks, MD;  Location: [facility];  Service: Plastics;  Laterality: Right;   BREAST RECONSTRUCTION Right 12/12/2014   Procedure: REVISION OF RIGHT RECONSTRUCTIVE BREAST WITH CAPSULORRHAPHY PLACEMENT  OF SILCONE IMPLANTS AND LIPOFILLING TO RIGHT CHEST ;  Surgeon: Earlis Ranks, MD;  Location: [facility];  Service: Plastics;  Laterality: Right;   BREAST RECONSTRUCTION Right 03/06/2015   Procedure: RIGHT NIPPLE AEROLA COMPLEX RECONSTRUCTION AND LOCAL FLAP WITH LIPO FILLING TO RIGHT BREAST,REMOVAL RIGHT CHEST PORT;  Surgeon: Earlis Ranks, MD;  Location: [facility];  Service: Plastics;  Laterality: Right;   BREAST RECONSTRUCTION Right 05/18/2018   Procedure: revision right breast reconstruction with silicone implant exchange;  Surgeon: Ranks Earlis, MD;  Location: [facility];  Service: Plastics;  Laterality: Right;   BREAST RECONSTRUCTION WITH PLACEMENT OF TISSUE EXPANDER AND FLEX HD (ACELLULAR HYDRATED DERMIS) Right 12/13/2013   Procedure: RIGHT BREAST RECONSTRUCTION WITH PLACEMENT OF TISSUE EXPANDER AND FLEX HD (ACELLULAR HYDRATED DERMIS);  Surgeon: Earlis Ranks, MD;  Location: [facility];  Service: Plastics;  Laterality: Right;   CHOLECYSTECTOMY  1970's   COLONOSCOPY     LIPOMA EXCISION Bilateral 12/13/2013   Procedure: EXCISION OF LEFT BREAST KELOID AND RIGHT CHEST KELOID;  Surgeon: Earlis Ranks, MD;  Location: [facility];  Service: Plastics;  Laterality: Bilateral;   LIPOSUCTION WITH LIPOFILLING Right 09/26/2014   Procedure: LIPOSUCTION WITH LIPOFILLING;  Surgeon: Earlis Ranks, MD;  Location: [facility];  Service: Plastics;  Laterality: Right;   LIPOSUCTION WITH LIPOFILLING Right 12/12/2014   Procedure: LIPOSUCTION WITH LIPOFILLING;  Surgeon: Earlis Ranks, MD;  Location: [facility];  Service: Plastics;  Laterality: Right;   LIPOSUCTION WITH LIPOFILLING Right 03/06/2015   Procedure: ABDOMINAL LIPOSUCTION WITH LIPOFILLING TO RIGHT BREAST;  Surgeon: Earlis Ranks, MD;  Location: [facility];  Service: Plastics;  Laterality: Right;   MASTECTOMY Right 2015    MASTECTOMY W/ SENTINEL NODE BIOPSY Right 12/13/2013   Procedure: RIGHT TOTAL MASTECTOMY WITH SENTINEL LYMPH NODE BIOPSY;  Surgeon: Elon CHRISTELLA Pacini, MD;  Location: [facility];  Service: General;  Laterality: Right;   MASTOPEXY Left 09/26/2014   Procedure: MASTOPEXY;  Surgeon: Earlis Ranks, MD;  Location: [facility];  Service: Plastics;  Laterality: Left;   PORT-A-CATH REMOVAL Right 03/06/2015   Procedure: REMOVAL PORT-A-CATH;  Surgeon: Earlis Ranks, MD;  Location: [facility];  Service: Plastics;  Laterality: Right;   PORTACATH PLACEMENT Right 12/13/2013   Procedure: INSERTION PORT-A-CATH;  Surgeon: Elon CHRISTELLA Pacini, MD;  Location: [facility];  Service: General;  Laterality: Right;   REDUCTION MAMMAPLASTY Left 2015   REMOVAL OF BILATERAL TISSUE EXPANDERS WITH PLACEMENT OF BILATERAL BREAST IMPLANTS Bilateral 09/26/2014   Procedure: REMOVAL OF RIGHT TISSUE EXPANDER WITH PLACEMENT OF RIGHT BREAST IMPLANT LIPOFILLING TO RIGHT BREAST/LEFT BREAST MASTOPEXY FOR SYMMETRY;  Surgeon: Earlis Ranks, MD;  Location: [facility];  Service: Plastics;  Laterality: Bilateral;   TISSUE EXPANDER PLACEMENT Right 01/10/2014   Procedure: Incision and Drainage Right Breast Seroma with TISSUE EXPANDER exchange;  Surgeon: Earlis Ranks, MD;  Location: [facility];  Service: Plastics;  Laterality: Right;   VAGINAL HYSTERECTOMY  1980   no salpingo-[id number]   Patient Active Problem List   Diagnosis Date Noted   Essential (primary) hypertension 09/17/2023   Osteopenia of lumbar spine 09/15/2023   Elevated blood pressure reading without diagnosis of hypertension 09/15/2023   Mixed hyperlipidemia 04/27/2023   Chronic gout of foot 02/11/2023   Chronic kidney disease, stage 3b (HCC) 02/11/2023   Borderline diabetes 01/25/2023   Osteopenia of both hips 02/27/2015   Personal history of malignant neoplasm of breast 09/26/2014   Rheumatoid arthritis  (HCC) 06/27/2014   Chemotherapy induced cardiomyopathy (HCC) 05/11/2014   Chemotherapy-induced neuropathy (HCC) 03/14/2014    PCP: Lucius Krabbe, NP  REFERRING PROVIDER: Joane Artist RAMAN, MD  REFERRING DIAG: .M54.42,M54.41,G89.29 (ICD-10-CM) - Chronic bilateral low back pain with bilateral sciatica  Rationale for Evaluation and Treatment: Rehabilitation  THERAPY DIAG:  Other low back pain  Muscle weakness (generalized)  Chronic pain of both shoulders  ONSET DATE: Nov 2023  SUBJECTIVE:  SUBJECTIVE STATEMENT: 10/21/2023 States she woke up with increased pain and the more she walked the more she did stuff the worse it got. States she has been sitting to help with her pain.    Eval:States that she has RA and she has had hip and back pain intermittently. States that there are days when she can hardly move. States that she has had neuropathy numbness in her hands and feet and this is going on today.   Works part time 3hrs a day with a client for [employer].    States the pain in November got worse. Some days are better than others. States heat takes the edge off. Ice hurts. Currently taking lyrical, tylenol , and was on prednisone .  Reports her shoulders have been hurting for  her off on and has difficutly washing hair.  Left arm is worse than the right. Patient is Right handed   PERTINENT HISTORY:  anxiety, fibromyalgia, HTN, RA, Hx R breast cancer 2015  PAIN:  Are you having pain? Yes: NPRS scale: 8/10 Pain location: b hip and shoulders Pain description: achy, grabbing Aggravating factors: sitting for long periods of time Relieving factors: heat  PRECAUTIONS: None  RED FLAGS: None   WEIGHT BEARING RESTRICTIONS: No  FALLS:  Has patient fallen in last 6 months? No      OCCUPATION: aide - part time  PLOF: Independent  PATIENT GOALS: to have less pain  NEXT MD VISIT: next week  OBJECTIVE:  Note: Objective measures were completed at Evaluation unless otherwise noted.  DIAGNOSTIC FINDINGS:  09/21/23 B hip xray FINDINGS: There is no evidence of hip fracture or dislocation. There is no evidence of arthropathy or other focal bone abnormality.   IMPRESSION: Negative.  PATIENT SURVEYS:  FOTO 24%  COGNITION: Overall cognitive status: Within functional limits for tasks assessed     SENSATION: Not tested    POSTURE:  grips glutes and standing, hips externally rotated and standing knees hyperextended with excessive lumbar lordosis noted  PALPATION: Tenderness to palpation along bilateral hips  LUMBAR ROM:   AROM eval  Flexion 50% limited  Extension 75% limited *  Right lateral flexion 75% limited *  Left lateral flexion 50% limited   Right rotation   Left rotation    (Blank rows = not tested)    UE Measurements Upper Extremity left EVAL Right  EVAL   A/PROM MMT A/PROM MMT  Shoulder Flexion /160*  /160*   Shoulder Extension      Shoulder Abduction /170*  /170*   Shoulder Adduction      Shoulder Internal Rotation /70*  /45*   Shoulder External Rotation /70*  /60*   Elbow Flexion      Elbow Extension      Wrist Flexion      Wrist Extension      Wrist Supination      Wrist Pronation      Wrist Ulnar Deviation      Wrist Radial Deviation      Grip Strength NA  NA     (Blank rows = not tested)   * pain   LE Measurements Lower Extremity Right EVAL Left EVAL   A/PROM MMT A/PROM MMT  Hip Flexion WFL*  WFL*   Hip Extension      Hip Abduction      Hip Adduction      Hip Internal rotation (tested supine) /15*  /10*   Hip External rotation (tested supine) /35*  /30*   Knee Flexion  Knee Extension      Ankle Dorsiflexion      Ankle Plantarflexion      Ankle Inversion      Ankle Eversion       (Blank rows = not  tested) * pain   LUMBAR SPECIAL TESTS:  L slump + , - SLR B  FUNCTIONAL TESTS:  Sit to stand: Bilateral use of upper extremities, pain, slow transitional movement  GAIT: Distance walked: 25 feet within clinic Assistive device utilized: None Level of assistance: Modified independence Comments: Wide base of support, Limited hip mobility, slow labored movements  TREATMENT DATE: 10/21/2023 Therapeutic Exercise:  Review of HEP and answering all questions Supine: bent knee fall outs 1.5 minutes, LTR 1.5 minutes, hip IR 1.5 minutes hip add iso x5 holds 1.5 minutes, hip abd 5 holds belt 2 minutes, shoulder ER B 2 minutes , chest  press 1 minute x2, self massage with percussion gun 8 minutes total to B hips/shoulders Prone:  Seated:  Standing: Neuromuscular Re-education: Manual Therapy: Vibration therapy with percussion gun and use of towel to B  upper extremities and B hips 15 minutes  Therapeutic Activity: Self Care: Trigger Point Dry Needling:  Modalities: thermotherapy to lumbar and cervical spine in supine doing treatment session    PATIENT EDUCATION:  Education details: on HEP Person educated: Patient Education method: Explanation, Demonstration, and Handouts Education comprehension: verbalized understanding   HOME EXERCISE PROGRAM: [id number]  ASSESSMENT:  CLINICAL IMPRESSION: 10/21/2023 Able to progress exercises. Added all Medbridge exercises to HEP. No increase in pain during session and reduced pain noted with slight muscular fatigue end of session with patient reporting 0/10 pain. Overall patient doing well and would continue to benefit from skilled PT    Eval: Patient presents to physical therapy with complaints of low back pain, bilateral hip and leg pain as well as bilateral shoulder pain.  Symptoms have no mechanism of injury patient has been mobilizing with braces and using heat intermittently.  Pain has gradually increased over the last few weeks and was  recommended to come here to help with her pain.  Patient has difficulties washing her hair, transitional movements in and out of chairs and walking around when her pain is really bad.  Patient presents with weakness, range of motion deficits and muscle guarding that is likely contributing to current presentation.  It is noted that upon end of session patient had improved transitional mobility and reported significantly less pain.  Patient would greatly benefit from skilled PT to improve overall function and quality of life.   OBJECTIVE IMPAIRMENTS: Abnormal gait, decreased activity tolerance, decreased balance, decreased mobility, difficulty walking, decreased ROM, decreased strength, impaired UE functional use, improper body mechanics, prosthetic dependency , and pain.   ACTIVITY LIMITATIONS: carrying, lifting, sitting, standing, squatting, stairs, transfers, bed mobility, dressing, reach over head, hygiene/grooming, locomotion level, and caring for others  PARTICIPATION LIMITATIONS: meal prep, cleaning, community activity, and occupation  PERSONAL FACTORS: Age, Fitness, and 3+ comorbidities: Bilateral neuropathy in hands and feet, RA, chronic knee pain  are also affecting patient's functional outcome.   REHAB POTENTIAL: Good  CLINICAL DECISION MAKING: Stable/uncomplicated  EVALUATION COMPLEXITY: Low   GOALS: Goals reviewed with patient? yes  SHORT TERM GOALS: Target date: 11/30/2023  Patient will be independent in self management strategies to improve quality of life and functional outcomes. Baseline: New Program Goal status: INITIAL  2.  Patient will report at least 50% improvement in overall symptoms and/or function to demonstrate improved functional mobility Baseline:  0% better Goal status: INITIAL  3.  Patient will be able to transition from sit to stand without the use of her hands and without pain to improve transitional movements Baseline: Unable Goal status:  INITIAL     LONG TERM GOALS: Target date: 01/11/2024   Patient will report at least 75% improvement in overall symptoms and/or function to demonstrate improved functional mobility Baseline: 0% better Goal status: INITIAL  2.  Patient will improve score on FOTO outcomes measure to projected score to demonstrate overall improved function and QOL Baseline: see above Goal status: INITIAL  3.  Patient will demonstrate pain-free shoulder range of motion in both shoulders in all directions Baseline: Painful Goal status: INITIAL  4.  Patient will demonstrate pain-free hip range of motion in all directions in both hips Baseline: Painful Goal status: INITIAL   PLAN:  PT FREQUENCY: 1-2x/week for total of 16 visits over 12 weeks certification period  PT DURATION: 12 weeks  PLANNED INTERVENTIONS: 97110-Therapeutic exercises, 97530- Therapeutic activity, 97112- Neuromuscular re-education, 97535- Self Care, [id number]- Manual therapy, [phone]- Gait training, [phone]- Orthotic Fit/training, [phone]- Canalith repositioning, [id number]- Aquatic Therapy, [id number]- Electrical stimulation (unattended), [phone]- Ionotophoresis 4mg /ml Dexamethasone , Patient/Family education, Balance training, Stair training, Taping, Dry Needling, Joint mobilization, Joint manipulation, Spinal manipulation, Spinal mobilization, Cryotherapy, and Moist heat   PLAN FOR NEXT SESSION: Assess balance, pain management strategies with heat and vibration, gentle range of motion exercises, strengthening once able  1:00 PM, 10/21/23 Olivia Church, DPT Physical Therapy with [facility]

## 2023-10-26 ENCOUNTER — Encounter: Payer: Self-pay | Admitting: Physical Therapy

## 2023-10-26 ENCOUNTER — Ambulatory Visit: Payer: Medicare Other | Admitting: Physical Therapy

## 2023-10-26 ENCOUNTER — Ambulatory Visit: Payer: Medicare Other | Admitting: Family Medicine

## 2023-10-26 VITALS — BP 158/108 | HR 99 | Ht 60.0 in | Wt 164.0 lb

## 2023-10-26 DIAGNOSIS — M25552 Pain in left hip: Secondary | ICD-10-CM | POA: Diagnosis not present

## 2023-10-26 DIAGNOSIS — M5412 Radiculopathy, cervical region: Secondary | ICD-10-CM | POA: Diagnosis not present

## 2023-10-26 DIAGNOSIS — M25551 Pain in right hip: Secondary | ICD-10-CM | POA: Diagnosis not present

## 2023-10-26 DIAGNOSIS — M25512 Pain in left shoulder: Secondary | ICD-10-CM

## 2023-10-26 DIAGNOSIS — M25511 Pain in right shoulder: Secondary | ICD-10-CM | POA: Diagnosis not present

## 2023-10-26 DIAGNOSIS — M6281 Muscle weakness (generalized): Secondary | ICD-10-CM

## 2023-10-26 DIAGNOSIS — M5442 Lumbago with sciatica, left side: Secondary | ICD-10-CM

## 2023-10-26 DIAGNOSIS — G8929 Other chronic pain: Secondary | ICD-10-CM

## 2023-10-26 DIAGNOSIS — M5459 Other low back pain: Secondary | ICD-10-CM | POA: Diagnosis not present

## 2023-10-26 DIAGNOSIS — M5441 Lumbago with sciatica, right side: Secondary | ICD-10-CM | POA: Diagnosis not present

## 2023-10-26 NOTE — Progress Notes (Signed)
   LILLETTE Ileana Collet, PhD, LAT, ATC acting as a scribe for Artist Lloyd, MD.  Angela Gross is a 73 y.o. female who presents to [facility] today for f/u low back and bilat hip pain w/ radicular symptoms in LE. Pt was last seen by Dr. Lloyd on 09/21/23 and was prescribed prednisone  and referred to PT, completing 3 visits  Today, pt reports she is making progress w/ PT. Stiffness and soreness intermittently. Radicular symptoms in bilat UE and LE continues. She has been working on [employer] on her own.   She notes pain and numbness and tingling radiating down both arms primarily to the ulnar aspect of her hands.  This occurs at bedtime when she is sleeping and sometimes during the day with arm activity.  The left is generally worse than right.  Dx imaging: 09/21/23 L-spine & bilat hip/pelvis XR  Pertinent review of systems: No fevers or chills  Relevant historical information: Chemotherapy-induced neuropathy.  Hypertension.  History of breast cancer.   Exam:  BP (!) 158/108   Pulse 99   Ht 5' (1.524 m)   Wt 164 lb (74.4 kg)   SpO2 97%   BMI 32.03 kg/m  General: Well Developed, well nourished, and in no acute distress.   MSK: C-spine: Normal appearing Nontender palpation cervical midline.  Normal cervical motion. Mildly positive Spurling's test. Upper extremity strength is intact. Reflexes are intact. . L-spine nontender palpation spinal midline normal lumbar motion lower extremity strength is intact.  Left elbow normal.  Normal motion positive Tinel's at cubital tunnel.   Lab and Radiology Results  X-ray images cervical spine ordered today but patient did not get the x-ray on her way out.   Assessment and Plan: 73 y.o. female with low back pain with lower extremity paresthesias due to lumbar muscle spasm and dysfunction and hip abductor muscle dysfunction and weakness.  She is improving with early physical therapy.  Plan to continue PT and reassess  in 1 month.  Upper extremity paresthesias due to cubital tunnel syndrome or cervical radiculopathy or both. Plan for PT  PDMP not reviewed this encounter. Orders Placed This Encounter  Procedures   DG Cervical Spine 2 or 3 views    Standing Status:   Future    Expiration Date:   10/25/2024    Reason for Exam (SYMPTOM  OR DIAGNOSIS REQUIRED):   eval cervical rad    Preferred imaging location?:   [facility]   No orders of the defined types were placed in this encounter.    Discussed warning signs or symptoms. Please see discharge instructions. Patient expresses understanding.   The above documentation has been reviewed and is accurate and complete Artist Lloyd, M.D.

## 2023-10-26 NOTE — Patient Instructions (Signed)
 Thank you for coming in today.   Please get an Xray today before you leave   Consider a cubital tunnel brace when you are sleeping.  You could also use a towel rolled up around your elbow and taped down at night.   Recheck in 1 month.

## 2023-10-26 NOTE — Therapy (Signed)
 OUTPATIENT PHYSICAL THERAPY THORACOLUMBAR Treatment   Patient Name: Angela Gross MRN: [id number] DOB:[birth year], 73 y.o., female Today's Date: 10/26/2023  END OF SESSION:  PT End of Session - 10/26/23 1109     Visit Number 3    Number of Visits 16    Date for PT Re-Evaluation 01/11/24    Authorization Type [employer] medicare - auth submitted    PT Start Time 1109    PT Stop Time 1147    PT Time Calculation (min) 38 min    Activity Tolerance Patient tolerated treatment well    Behavior During Therapy [facility] for tasks assessed/performed             Past Medical History:  Diagnosis Date   Anxiety    Breast cancer (HCC)    right (01/10/2014)   Chronic left shoulder pain    work related injury (01/10/2014)   Constipation 10/04/2014   Degenerative disc disease, cervical    Fibromyalgia    Full dentures    History of breast cancer in female 10/25/2013   Hot flashes, menopausal 04/04/2014   Hypertension    Osteoarthritis of both knees    Osteoporosis    Personal history of chemotherapy    PONV (postoperative nausea and vomiting)    Raynaud disease    Rheumatoid arthritis (HCC)    elbows; fingers; toes   Syncope and collapse    4 times in the last year (01/10/2014)   Wears glasses    Past Surgical History:  Procedure Laterality Date   BREAST BIOPSY Right 10/2013   BREAST BIOPSY Left 07/22/2023   MM LT BREAST BX W LOC DEV 1ST LESION IMAGE BX SPEC STEREO GUIDE 07/22/2023 [facility]   BREAST BIOPSY Left 07/22/2023   MM LT BREAST BX W LOC DEV EA AD LESION IMG BX SPEC STEREO GUIDE 07/22/2023 [facility]   BREAST IMPLANT EXCHANGE Right 02/12/2016   Procedure: EXCHANGE SILICONE IMPLANT ON RIGHT, CAPSULORRAPHY AND PLACEMENT OF ALLODERM;  Surgeon: Earlis Ranks, MD;  Location: [facility];  Service: Plastics;  Laterality: Right;   BREAST RECONSTRUCTION Right 12/12/2014   Procedure: REVISION OF RIGHT RECONSTRUCTIVE BREAST WITH CAPSULORRHAPHY PLACEMENT  OF SILCONE IMPLANTS AND LIPOFILLING TO RIGHT CHEST ;  Surgeon: Earlis Ranks, MD;  Location: [facility];  Service: Plastics;  Laterality: Right;   BREAST RECONSTRUCTION Right 03/06/2015   Procedure: RIGHT NIPPLE AEROLA COMPLEX RECONSTRUCTION AND LOCAL FLAP WITH LIPO FILLING TO RIGHT BREAST,REMOVAL RIGHT CHEST PORT;  Surgeon: Earlis Ranks, MD;  Location: [facility];  Service: Plastics;  Laterality: Right;   BREAST RECONSTRUCTION Right 05/18/2018   Procedure: revision right breast reconstruction with silicone implant exchange;  Surgeon: Ranks Earlis, MD;  Location: [facility];  Service: Plastics;  Laterality: Right;   BREAST RECONSTRUCTION WITH PLACEMENT OF TISSUE EXPANDER AND FLEX HD (ACELLULAR HYDRATED DERMIS) Right 12/13/2013   Procedure: RIGHT BREAST RECONSTRUCTION WITH PLACEMENT OF TISSUE EXPANDER AND FLEX HD (ACELLULAR HYDRATED DERMIS);  Surgeon: Earlis Ranks, MD;  Location: [facility];  Service: Plastics;  Laterality: Right;   CHOLECYSTECTOMY  1970's   COLONOSCOPY     LIPOMA EXCISION Bilateral 12/13/2013   Procedure: EXCISION OF LEFT BREAST KELOID AND RIGHT CHEST KELOID;  Surgeon: Earlis Ranks, MD;  Location: [facility];  Service: Plastics;  Laterality: Bilateral;   LIPOSUCTION WITH LIPOFILLING Right 09/26/2014   Procedure: LIPOSUCTION WITH LIPOFILLING;  Surgeon: Earlis Ranks, MD;  Location: [facility];  Service: Plastics;  Laterality: Right;   LIPOSUCTION WITH LIPOFILLING Right 12/12/2014   Procedure: LIPOSUCTION WITH LIPOFILLING;  Surgeon: Earlis Ranks, MD;  Location: [facility];  Service: Plastics;  Laterality: Right;   LIPOSUCTION WITH LIPOFILLING Right 03/06/2015   Procedure: ABDOMINAL LIPOSUCTION WITH LIPOFILLING TO RIGHT BREAST;  Surgeon: Earlis Ranks, MD;  Location: [facility];  Service: Plastics;  Laterality: Right;   MASTECTOMY Right 2015    MASTECTOMY W/ SENTINEL NODE BIOPSY Right 12/13/2013   Procedure: RIGHT TOTAL MASTECTOMY WITH SENTINEL LYMPH NODE BIOPSY;  Surgeon: Elon CHRISTELLA Pacini, MD;  Location: [facility];  Service: General;  Laterality: Right;   MASTOPEXY Left 09/26/2014   Procedure: MASTOPEXY;  Surgeon: Earlis Ranks, MD;  Location: [facility];  Service: Plastics;  Laterality: Left;   PORT-A-CATH REMOVAL Right 03/06/2015   Procedure: REMOVAL PORT-A-CATH;  Surgeon: Earlis Ranks, MD;  Location: [facility];  Service: Plastics;  Laterality: Right;   PORTACATH PLACEMENT Right 12/13/2013   Procedure: INSERTION PORT-A-CATH;  Surgeon: Elon CHRISTELLA Pacini, MD;  Location: [facility];  Service: General;  Laterality: Right;   REDUCTION MAMMAPLASTY Left 2015   REMOVAL OF BILATERAL TISSUE EXPANDERS WITH PLACEMENT OF BILATERAL BREAST IMPLANTS Bilateral 09/26/2014   Procedure: REMOVAL OF RIGHT TISSUE EXPANDER WITH PLACEMENT OF RIGHT BREAST IMPLANT LIPOFILLING TO RIGHT BREAST/LEFT BREAST MASTOPEXY FOR SYMMETRY;  Surgeon: Earlis Ranks, MD;  [facility];  Service: Plastics;  Laterality: Bilateral;   TISSUE EXPANDER PLACEMENT Right 01/10/2014   Procedure: Incision and Drainage Right Breast Seroma with TISSUE EXPANDER exchange;  Surgeon: Earlis Ranks, MD;  Location: [facility];  Service: Plastics;  Laterality: Right;   VAGINAL HYSTERECTOMY  1980   no [id number]   Patient Active Problem List   Diagnosis Date Noted   Essential (primary) hypertension 09/17/2023   Osteopenia of lumbar spine 09/15/2023   Elevated blood pressure reading without diagnosis of hypertension 09/15/2023   Mixed hyperlipidemia 04/27/2023   Chronic gout of foot 02/11/2023   Chronic kidney disease, stage 3b (HCC) 02/11/2023   Borderline diabetes 01/25/2023   Osteopenia of both hips 02/27/2015   Personal history of malignant neoplasm of breast 09/26/2014   Rheumatoid arthritis  (HCC) 06/27/2014   Chemotherapy induced cardiomyopathy (HCC) 05/11/2014   Chemotherapy-induced neuropathy (HCC) 03/14/2014    PCP: Lucius Krabbe, NP  REFERRING PROVIDER: Joane Artist RAMAN, MD  REFERRING DIAG: .M54.42,M54.41,G89.29 (ICD-10-CM) - Chronic bilateral low back pain with bilateral sciatica  Rationale for Evaluation and Treatment: Rehabilitation  THERAPY DIAG:  Other low back pain  Muscle weakness (generalized)  Chronic pain of both shoulders  ONSET DATE: Nov 2023  SUBJECTIVE:  SUBJECTIVE STATEMENT: 10/26/2023 Feeling a little better. Been doing her exercises. States her back feels better but her shoulders are little painful.   Eval:States that she has RA and she has had hip and back pain intermittently. States that there are days when she can hardly move. States that she has had neuropathy numbness in her hands and feet and this is going on today.   Works part time 3hrs a day with a client for [employer].    States the pain in November got worse. Some days are better than others. States heat takes the edge off. Ice hurts. Currently taking lyrical, tylenol , and was on prednisone .  Reports her shoulders have been hurting for  her off on and has difficutly washing hair.  Left arm is worse than the right. Patient is Right handed   PERTINENT HISTORY:  anxiety, fibromyalgia, HTN, RA, Hx R breast cancer 2015  PAIN:  Are you having pain? Yes: NPRS scale: 5/10 Pain location: b hip and shoulders Pain description: stiff  Aggravating factors: sitting for long periods of time Relieving factors: heat  PRECAUTIONS: None  RED FLAGS: None   WEIGHT BEARING RESTRICTIONS: No  FALLS:  Has patient fallen in last 6 months? No     OCCUPATION: aide - part time  PLOF:  Independent  PATIENT GOALS: to have less pain  NEXT MD VISIT: next week  OBJECTIVE:  Note: Objective measures were completed at Evaluation unless otherwise noted.  DIAGNOSTIC FINDINGS:  09/21/23 B hip xray FINDINGS: There is no evidence of hip fracture or dislocation. There is no evidence of arthropathy or other focal bone abnormality.   IMPRESSION: Negative.  PATIENT SURVEYS:  FOTO 24%  COGNITION: Overall cognitive status: Within functional limits for tasks assessed     SENSATION: Not tested    POSTURE:  grips glutes and standing, hips externally rotated and standing knees hyperextended with excessive lumbar lordosis noted  PALPATION: Tenderness to palpation along bilateral hips  LUMBAR ROM:   AROM eval  Flexion 50% limited  Extension 75% limited *  Right lateral flexion 75% limited *  Left lateral flexion 50% limited   Right rotation   Left rotation    (Blank rows = not tested)    UE Measurements Upper Extremity left EVAL Right  EVAL   A/PROM MMT A/PROM MMT  Shoulder Flexion /160*  /160*   Shoulder Extension      Shoulder Abduction /170*  /170*   Shoulder Adduction      Shoulder Internal Rotation /70*  /45*   Shoulder External Rotation /70*  /60*   Elbow Flexion      Elbow Extension      Wrist Flexion      Wrist Extension      Wrist Supination      Wrist Pronation      Wrist Ulnar Deviation      Wrist Radial Deviation      Grip Strength NA  NA     (Blank rows = not tested)   * pain   LE Measurements Lower Extremity Right EVAL Left EVAL   A/PROM MMT A/PROM MMT  Hip Flexion WFL*  WFL*   Hip Extension      Hip Abduction      Hip Adduction      Hip Internal rotation (tested supine) /15*  /10*   Hip External rotation (tested supine) /35*  /30*   Knee Flexion      Knee Extension      Ankle  Dorsiflexion      Ankle Plantarflexion      Ankle Inversion      Ankle Eversion       (Blank rows = not tested) * pain   LUMBAR SPECIAL TESTS:   L slump + , - SLR B  FUNCTIONAL TESTS:  Sit to stand: Bilateral use of upper extremities, pain, slow transitional movement  GAIT: Distance walked: 25 feet within clinic Assistive device utilized: None Level of assistance: Modified independence Comments: Wide base of support, Limited hip mobility, slow labored movements  TREATMENT DATE: 10/26/2023 Therapeutic Exercise:  Review of HEP and answering all questions Supine: LTR 2 minutes, shoulder retraction 1.5 minutes, horizontal shoulder abd 4x5 B, shoulder scaption with ER activation YTB 1 minutes x2 rounds B, neck rolls 2x5 both directions Prone:  Seated: scapular retraction 5 holds 1 minute bouts x2 rounds  Standing: shoulder flexion up wall with pillowcase 2x10  Neuromuscular Re-education: Manual Therapy: Vibration therapy with percussion gun and use of towel to B  upper extremities 10 minutes Therapeutic Activity: Self Care: Trigger Point Dry Needling:  Modalities: thermotherapy to lumbar and cervical spine in supine doing treatment session    PATIENT EDUCATION:  Education details: on HEP Person educated: Patient Education method: Explanation, Demonstration, and Handouts Education comprehension: verbalized understanding   HOME EXERCISE PROGRAM: [id number]  ASSESSMENT:  CLINICAL IMPRESSION: 10/26/2023 Continued to progress exercises as tolerated. Reduced stiffness noted after use of percussion gun. Added theraband to HEP as it was tolerated well. Fatigue in arms noted end of session and reduced pain.Will continue with current POC as tolerated,   Eval: Patient presents to physical therapy with complaints of low back pain, bilateral hip and leg pain as well as bilateral shoulder pain.  Symptoms have no mechanism of injury patient has been mobilizing with braces and using heat intermittently.  Pain has gradually increased over the last few weeks and was recommended to come here to help with her pain.  Patient has difficulties  washing her hair, transitional movements in and out of chairs and walking around when her pain is really bad.  Patient presents with weakness, range of motion deficits and muscle guarding that is likely contributing to current presentation.  It is noted that upon end of session patient had improved transitional mobility and reported significantly less pain.  Patient would greatly benefit from skilled PT to improve overall function and quality of life.   OBJECTIVE IMPAIRMENTS: Abnormal gait, decreased activity tolerance, decreased balance, decreased mobility, difficulty walking, decreased ROM, decreased strength, impaired UE functional use, improper body mechanics, prosthetic dependency , and pain.   ACTIVITY LIMITATIONS: carrying, lifting, sitting, standing, squatting, stairs, transfers, bed mobility, dressing, reach over head, hygiene/grooming, locomotion level, and caring for others  PARTICIPATION LIMITATIONS: meal prep, cleaning, community activity, and occupation  PERSONAL FACTORS: Age, Fitness, and 3+ comorbidities: Bilateral neuropathy in hands and feet, RA, chronic knee pain  are also affecting patient's functional outcome.   REHAB POTENTIAL: Good  CLINICAL DECISION MAKING: Stable/uncomplicated  EVALUATION COMPLEXITY: Low   GOALS: Goals reviewed with patient? yes  SHORT TERM GOALS: Target date: 11/30/2023  Patient will be independent in self management strategies to improve quality of life and functional outcomes. Baseline: New Program Goal status: INITIAL  2.  Patient will report at least 50% improvement in overall symptoms and/or function to demonstrate improved functional mobility Baseline: 0% better Goal status: INITIAL  3.  Patient will be able to transition from sit to stand without the use of her  hands and without pain to improve transitional movements Baseline: Unable Goal status: INITIAL     LONG TERM GOALS: Target date: 01/11/2024   Patient will report at least  75% improvement in overall symptoms and/or function to demonstrate improved functional mobility Baseline: 0% better Goal status: INITIAL  2.  Patient will improve score on FOTO outcomes measure to projected score to demonstrate overall improved function and QOL Baseline: see above Goal status: INITIAL  3.  Patient will demonstrate pain-free shoulder range of motion in both shoulders in all directions Baseline: Painful Goal status: INITIAL  4.  Patient will demonstrate pain-free hip range of motion in all directions in both hips Baseline: Painful Goal status: INITIAL   PLAN:  PT FREQUENCY: 1-2x/week for total of 16 visits over 12 weeks certification period  PT DURATION: 12 weeks  PLANNED INTERVENTIONS: 97110-Therapeutic exercises, 97530- Therapeutic activity, 97112- Neuromuscular re-education, 97535- Self Care, [id number]- Manual therapy, [phone]- Gait training, [phone]- Orthotic Fit/training, [phone]- Canalith repositioning, [id number]- Aquatic Therapy, 97014- Electrical stimulation (unattended), [phone]- Ionotophoresis 4mg /ml Dexamethasone , Patient/Family education, Balance training, Stair training, Taping, Dry Needling, Joint mobilization, Joint manipulation, Spinal manipulation, Spinal mobilization, Cryotherapy, and Moist heat   PLAN FOR NEXT SESSION: Assess balance, pain management strategies with heat and vibration, gentle range of motion exercises, strengthening once able  12:36 PM, 10/26/23 Olivia Church, DPT Physical Therapy with [facility]

## 2023-10-28 ENCOUNTER — Ambulatory Visit: Payer: Medicare Other | Admitting: Physical Therapy

## 2023-10-28 ENCOUNTER — Encounter: Payer: Self-pay | Admitting: Physical Therapy

## 2023-10-28 DIAGNOSIS — G8929 Other chronic pain: Secondary | ICD-10-CM | POA: Diagnosis not present

## 2023-10-28 DIAGNOSIS — M25511 Pain in right shoulder: Secondary | ICD-10-CM

## 2023-10-28 DIAGNOSIS — M5459 Other low back pain: Secondary | ICD-10-CM

## 2023-10-28 DIAGNOSIS — M25512 Pain in left shoulder: Secondary | ICD-10-CM | POA: Diagnosis not present

## 2023-10-28 DIAGNOSIS — M6281 Muscle weakness (generalized): Secondary | ICD-10-CM

## 2023-10-28 NOTE — Therapy (Signed)
 OUTPATIENT PHYSICAL THERAPY THORACOLUMBAR Treatment   Patient Name: Angela Gross MRN: [id number] DOB:26-Nov-1950, 73 y.o., female Today's Date: 10/28/2023  END OF SESSION:  PT End of Session - 10/28/23 1345     Visit Number 4    Number of Visits 16    Date for PT Re-Evaluation 01/11/24    Authorization Type [employer] medicare - auth approved 10/19/2023 - 01/11/2024  Number of visits: 16 Visit(s)    Authorization - Visit Number 4    Authorization - Number of Visits 16    Progress Note Due on Visit 10    PT Start Time 1346    PT Stop Time 1425    PT Time Calculation (min) 39 min    Activity Tolerance Patient tolerated treatment well    Behavior During Therapy WFL for tasks assessed/performed             Past Medical History:  Diagnosis Date   Anxiety    Breast cancer (HCC)    "right" (01/10/2014)   Chronic left shoulder pain    "work related injury" (01/10/2014)   Constipation 10/04/2014   Degenerative disc disease, cervical    Fibromyalgia    Full dentures    History of breast cancer in female 10/25/2013   Hot flashes, menopausal 04/04/2014   Hypertension    Osteoarthritis of both knees    Osteoporosis    Personal history of chemotherapy    PONV (postoperative nausea and vomiting)    Raynaud disease    Rheumatoid arthritis (HCC)    "elbows; fingers; toes"   Syncope and collapse    "4 times in the last year" (01/10/2014)   Wears glasses    Past Surgical History:  Procedure Laterality Date   BREAST BIOPSY Right 10/2013   BREAST BIOPSY Left 07/22/2023   MM LT BREAST BX W LOC DEV 1ST LESION IMAGE BX SPEC STEREO GUIDE 07/22/2023 [facility]   BREAST BIOPSY Left 07/22/2023   MM LT BREAST BX W LOC DEV EA AD LESION IMG BX SPEC STEREO GUIDE 07/22/2023 [facility]   BREAST IMPLANT EXCHANGE Right 02/12/2016   Procedure: EXCHANGE SILICONE IMPLANT ON RIGHT, CAPSULORRAPHY AND PLACEMENT OF ALLODERM;  Surgeon: Alger Infield, MD;  Location: [facility];   Service: Plastics;  Laterality: Right;   BREAST RECONSTRUCTION Right 12/12/2014   Procedure: REVISION OF RIGHT RECONSTRUCTIVE BREAST WITH CAPSULORRHAPHY PLACEMENT OF SILCONE IMPLANTS AND LIPOFILLING TO RIGHT CHEST ;  Surgeon: Alger Infield, MD;  Location: [facility];  Service: Plastics;  Laterality: Right;   BREAST RECONSTRUCTION Right 03/06/2015   Procedure: RIGHT NIPPLE AEROLA COMPLEX RECONSTRUCTION AND LOCAL FLAP WITH LIPO FILLING TO RIGHT BREAST,REMOVAL RIGHT CHEST PORT;  Surgeon: Alger Infield, MD;  Location: [facility];  Service: Plastics;  Laterality: Right;   BREAST RECONSTRUCTION Right 05/18/2018   Procedure: revision right breast reconstruction with silicone implant exchange;  Surgeon: Alger Infield, MD;  Location: [facility];  Service: Plastics;  Laterality: Right;   BREAST RECONSTRUCTION WITH PLACEMENT OF TISSUE EXPANDER AND FLEX HD (ACELLULAR HYDRATED DERMIS) Right 12/13/2013   Procedure: RIGHT BREAST RECONSTRUCTION WITH PLACEMENT OF TISSUE EXPANDER AND FLEX HD (ACELLULAR HYDRATED DERMIS);  Surgeon: Alger Infield, MD;  Location: [facility];  Service: Plastics;  Laterality: Right;   CHOLECYSTECTOMY  1970's   COLONOSCOPY     LIPOMA EXCISION Bilateral 12/13/2013   Procedure: EXCISION OF LEFT BREAST KELOID AND RIGHT CHEST KELOID;  Surgeon: Alger Infield, MD;  Location: [facility];  Service: Plastics;  Laterality: Bilateral;   LIPOSUCTION WITH LIPOFILLING Right 09/26/2014   Procedure: LIPOSUCTION WITH LIPOFILLING;  Surgeon: Alger Infield, MD;  Location: [facility];  Service: Plastics;  Laterality: Right;   LIPOSUCTION WITH LIPOFILLING Right 12/12/2014   Procedure: LIPOSUCTION WITH LIPOFILLING;  Surgeon: Alger Infield, MD;  Location: [facility];  Service: Plastics;  Laterality: Right;   LIPOSUCTION WITH LIPOFILLING Right 03/06/2015   Procedure: ABDOMINAL LIPOSUCTION WITH  LIPOFILLING TO RIGHT BREAST;  Surgeon: Alger Infield, MD;  Location: [facility];  Service: Plastics;  Laterality: Right;   MASTECTOMY Right 2015   MASTECTOMY W/ SENTINEL NODE BIOPSY Right 12/13/2013   Procedure: RIGHT TOTAL MASTECTOMY WITH SENTINEL LYMPH NODE BIOPSY;  Surgeon: Levert Ready, MD;  Location: [facility];  Service: General;  Laterality: Right;   MASTOPEXY Left 09/26/2014   Procedure: MASTOPEXY;  Surgeon: Alger Infield, MD;  Location: [facility];  Service: Plastics;  Laterality: Left;   PORT-A-CATH REMOVAL Right 03/06/2015   Procedure: REMOVAL PORT-A-CATH;  Surgeon: Alger Infield, MD;  Location: [facility];  Service: Plastics;  Laterality: Right;   PORTACATH PLACEMENT Right 12/13/2013   Procedure: INSERTION PORT-A-CATH;  Surgeon: Levert Ready, MD;  Location: [facility];  Service: General;  Laterality: Right;   REDUCTION MAMMAPLASTY Left 2015   REMOVAL OF BILATERAL TISSUE EXPANDERS WITH PLACEMENT OF BILATERAL BREAST IMPLANTS Bilateral 09/26/2014   Procedure: REMOVAL OF RIGHT TISSUE EXPANDER WITH PLACEMENT OF RIGHT BREAST IMPLANT LIPOFILLING TO RIGHT BREAST/LEFT BREAST MASTOPEXY FOR SYMMETRY;  Surgeon: Alger Infield, MD;  Location: [facility];  Service: Plastics;  Laterality: Bilateral;   TISSUE EXPANDER PLACEMENT Right 01/10/2014   Procedure: Incision and Drainage Right Breast Seroma with TISSUE EXPANDER exchange;  Surgeon: Alger Infield, MD;  Location: [facility];  Service: Plastics;  Laterality: Right;   VAGINAL HYSTERECTOMY  1980   no [id number]   Patient Active Problem List   Diagnosis Date Noted   Essential (primary) hypertension 09/17/2023   Osteopenia of lumbar spine 09/15/2023   Elevated blood pressure reading without diagnosis of hypertension 09/15/2023   Mixed hyperlipidemia 04/27/2023   Chronic gout of foot 02/11/2023   Chronic kidney disease, stage 3b (HCC)  02/11/2023   Borderline diabetes 01/25/2023   Osteopenia of both hips 02/27/2015   Personal history of malignant neoplasm of breast 09/26/2014   Rheumatoid arthritis (HCC) 06/27/2014   Chemotherapy induced cardiomyopathy (HCC) 05/11/2014   Chemotherapy-induced neuropathy (HCC) 03/14/2014    PCP: Versa Gore, NP  REFERRING PROVIDER: Syliva Even, MD  REFERRING DIAG: .M54.42,M54.41,G89.29 (ICD-10-CM) - Chronic bilateral low back pain with bilateral sciatica  Rationale for Evaluation and Treatment: Rehabilitation  THERAPY DIAG:  Other low back pain  Muscle weakness (generalized)  Chronic pain of both shoulders  ONSET DATE: Nov 2023  SUBJECTIVE:  SUBJECTIVE STATEMENT: 10/28/2023 Feeling a little better. Just stiffness in shoulders and in low back. MD wants him to continue with therapy for at least a month.    Eval:States that she has RA and she has had hip and back pain intermittently. States that there are days when she can hardly move. States that she has had neuropathy numbness in her hands and feet and this is going on today.   Works part time 3hrs a day with a client for [employer].    States the pain in November got worse. Some days are better than others. States heat takes the edge off. Ice hurts. Currently taking lyrical, tylenol , and was on prednisone .  Reports her shoulders have been hurting for  her off on and has difficutly washing hair.  Left arm is worse than the right. Patient is Right handed   PERTINENT HISTORY:  anxiety, fibromyalgia, HTN, RA, Hx R breast cancer 2015  PAIN:  Are you having pain? Yes: NPRS scale: 5/10 Pain location: b hip and shoulders Pain description: stiff  Aggravating factors: sitting for long periods of time Relieving factors:  heat  PRECAUTIONS: None  RED FLAGS: None   WEIGHT BEARING RESTRICTIONS: No  FALLS:  Has patient fallen in last 6 months? No     OCCUPATION: aide - part time  PLOF: Independent  PATIENT GOALS: to have less pain  NEXT MD VISIT: next week  OBJECTIVE:  Note: Objective measures were completed at Evaluation unless otherwise noted.  DIAGNOSTIC FINDINGS:  09/21/23 B hip xray FINDINGS: There is no evidence of hip fracture or dislocation. There is no evidence of arthropathy or other focal bone abnormality.   IMPRESSION: Negative.  PATIENT SURVEYS:  FOTO 24%  COGNITION: Overall cognitive status: Within functional limits for tasks assessed     SENSATION: Not tested    POSTURE:  grips glutes and standing, hips externally rotated and standing knees hyperextended with excessive lumbar lordosis noted  PALPATION: Tenderness to palpation along bilateral hips  LUMBAR ROM:   AROM eval  Flexion 50% limited  Extension 75% limited *  Right lateral flexion 75% limited *  Left lateral flexion 50% limited   Right rotation   Left rotation    (Blank rows = not tested)    UE Measurements Upper Extremity left EVAL Right  EVAL   A/PROM MMT A/PROM MMT  Shoulder Flexion /160*  /160*   Shoulder Extension      Shoulder Abduction /170*  /170*   Shoulder Adduction      Shoulder Internal Rotation /70*  /45*   Shoulder External Rotation /70*  /60*   Elbow Flexion      Elbow Extension      Wrist Flexion      Wrist Extension      Wrist Supination      Wrist Pronation      Wrist Ulnar Deviation      Wrist Radial Deviation      Grip Strength NA  NA     (Blank rows = not tested)   * pain   LE Measurements Lower Extremity Right EVAL Left EVAL   A/PROM MMT A/PROM MMT  Hip Flexion WFL*  WFL*   Hip Extension      Hip Abduction      Hip Adduction      Hip Internal rotation (tested supine) /15*  /10*   Hip External rotation (tested supine) /35*  /30*   Knee Flexion       Knee  Extension      Ankle Dorsiflexion      Ankle Plantarflexion      Ankle Inversion      Ankle Eversion       (Blank rows = not tested) * pain   LUMBAR SPECIAL TESTS:  L slump + , - SLR B  FUNCTIONAL TESTS:  Sit to stand: Bilateral use of upper extremities, pain, slow transitional movement  GAIT: Distance walked: 25 feet within clinic Assistive device utilized: None Level of assistance: Modified independence Comments: Wide base of support, Limited hip mobility, slow labored movements  TREATMENT DATE: 10/28/2023 Therapeutic Exercise:  Review of HEP and answering all questions Supine: SAQs on ball 2 minutes B, 90/90 relief position 2 minutes, LTR on ball 2 minutes, piriformis stretch x3 30" holds B   quad: rock backs x15 5" holds  Seated:  Lumbar flexion with ball 2 minutes  Standing: marching facing wall 4x5 5" holds B Neuromuscular Re-education: Manual Therapy:   Therapeutic Activity: Self Care: Trigger Point Dry Needling:  Modalities: thermotherapy to lumbar and cervical spine in supine doing treatment session    PATIENT EDUCATION:  Education details: on HEP Person educated: Patient Education method: Explanation, Facilities manager, and Handouts Education comprehension: verbalized understanding   HOME EXERCISE PROGRAM: [id number]  ASSESSMENT:  CLINICAL IMPRESSION: 10/28/2023 Continued with mobility exercises but also able to add strengthening exercises. Patient doing ell and would like to transition to 1x/week at this time. Reduced stiffness and no increase in pain noted during or after today's session. Added new exercises to HEP and will continue with current POC.  Eval: Patient presents to physical therapy with complaints of low back pain, bilateral hip and leg pain as well as bilateral shoulder pain.  Symptoms have no mechanism of injury patient has been mobilizing with braces and using heat intermittently.  Pain has gradually increased over the last few weeks  and was recommended to come here to help with her pain.  Patient has difficulties washing her hair, transitional movements in and out of chairs and walking around when her pain is really bad.  Patient presents with weakness, range of motion deficits and muscle guarding that is likely contributing to current presentation.  It is noted that upon end of session patient had improved transitional mobility and reported significantly less pain.  Patient would greatly benefit from skilled PT to improve overall function and quality of life.   OBJECTIVE IMPAIRMENTS: Abnormal gait, decreased activity tolerance, decreased balance, decreased mobility, difficulty walking, decreased ROM, decreased strength, impaired UE functional use, improper body mechanics, prosthetic dependency , and pain.   ACTIVITY LIMITATIONS: carrying, lifting, sitting, standing, squatting, stairs, transfers, bed mobility, dressing, reach over head, hygiene/grooming, locomotion level, and caring for others  PARTICIPATION LIMITATIONS: meal prep, cleaning, community activity, and occupation  PERSONAL FACTORS: Age, Fitness, and 3+ comorbidities: Bilateral neuropathy in hands and feet, RA, chronic knee pain  are also affecting patient's functional outcome.   REHAB POTENTIAL: Good  CLINICAL DECISION MAKING: Stable/uncomplicated  EVALUATION COMPLEXITY: Low   GOALS: Goals reviewed with patient? yes  SHORT TERM GOALS: Target date: 11/30/2023  Patient will be independent in self management strategies to improve quality of life and functional outcomes. Baseline: New Program Goal status: INITIAL  2.  Patient will report at least 50% improvement in overall symptoms and/or function to demonstrate improved functional mobility Baseline: 0% better Goal status: INITIAL  3.  Patient will be able to transition from sit to stand without the use of her hands and  without pain to improve transitional movements Baseline: Unable Goal status:  INITIAL     LONG TERM GOALS: Target date: 01/11/2024   Patient will report at least 75% improvement in overall symptoms and/or function to demonstrate improved functional mobility Baseline: 0% better Goal status: INITIAL  2.  Patient will improve score on FOTO outcomes measure to projected score to demonstrate overall improved function and QOL Baseline: see above Goal status: INITIAL  3.  Patient will demonstrate pain-free shoulder range of motion in both shoulders in all directions Baseline: Painful Goal status: INITIAL  4.  Patient will demonstrate pain-free hip range of motion in all directions in both hips Baseline: Painful Goal status: INITIAL   PLAN:  PT FREQUENCY: 1-2x/week for total of 16 visits over 12 weeks certification period  PT DURATION: 12 weeks  PLANNED INTERVENTIONS: 97110-Therapeutic exercises, 97530- Therapeutic activity, 97112- Neuromuscular re-education, 97535- Self Care, [id number]- Manual therapy, [phone]- Gait training, [phone]- Orthotic Fit/training, [phone]- Canalith repositioning, [id number]- Aquatic Therapy, [id number]- Electrical stimulation (unattended), [phone]- Ionotophoresis 4mg /ml Dexamethasone , Patient/Family education, Balance training, Stair training, Taping, Dry Needling, Joint mobilization, Joint manipulation, Spinal manipulation, Spinal mobilization, Cryotherapy, and Moist heat   PLAN FOR NEXT SESSION: Assess balance, pain management strategies with heat and vibration, gentle range of motion exercises, strengthening once able  2:30 PM, 10/28/23 Tabitha Ewings, DPT [facility]

## 2023-11-02 ENCOUNTER — Encounter: Payer: Self-pay | Admitting: Physical Therapy

## 2023-11-02 ENCOUNTER — Ambulatory Visit (INDEPENDENT_AMBULATORY_CARE_PROVIDER_SITE_OTHER): Payer: Medicare Other | Admitting: Physician Assistant

## 2023-11-02 ENCOUNTER — Ambulatory Visit: Payer: Medicare Other | Admitting: Physical Therapy

## 2023-11-02 ENCOUNTER — Encounter: Payer: Self-pay | Admitting: Physician Assistant

## 2023-11-02 VITALS — BP 124/80 | HR 92 | Temp 97.2°F | Ht 60.0 in | Wt 164.0 lb

## 2023-11-02 DIAGNOSIS — M25511 Pain in right shoulder: Secondary | ICD-10-CM

## 2023-11-02 DIAGNOSIS — M5459 Other low back pain: Secondary | ICD-10-CM | POA: Diagnosis not present

## 2023-11-02 DIAGNOSIS — R051 Acute cough: Secondary | ICD-10-CM

## 2023-11-02 DIAGNOSIS — M6281 Muscle weakness (generalized): Secondary | ICD-10-CM

## 2023-11-02 DIAGNOSIS — M25512 Pain in left shoulder: Secondary | ICD-10-CM | POA: Diagnosis not present

## 2023-11-02 DIAGNOSIS — G8929 Other chronic pain: Secondary | ICD-10-CM | POA: Diagnosis not present

## 2023-11-02 LAB — POC COVID19 BINAXNOW: SARS Coronavirus 2 Ag: NEGATIVE

## 2023-11-02 LAB — POCT RAPID STREP A (OFFICE): Rapid Strep A Screen: NEGATIVE

## 2023-11-02 MED ORDER — AZITHROMYCIN 250 MG PO TABS: ORAL_TABLET | ORAL | 0 refills | Status: AC

## 2023-11-02 MED ORDER — BENZONATATE 100 MG PO CAPS: 100.0000 mg | ORAL_CAPSULE | Freq: Three times a day (TID) | ORAL | 1 refills | Status: DC | PRN

## 2023-11-02 NOTE — Therapy (Signed)
OUTPATIENT PHYSICAL THERAPY THORACOLUMBAR Treatment   Patient Name: Angela Gross MRN: [id number] DOB:Jan 03, 1951, 73 y.o., female Today's Date: 11/02/2023  END OF SESSION:  PT End of Session - 11/02/23 1216     Visit Number 5    Number of Visits 16    Date for PT Re-Evaluation 01/11/24    Authorization Type [employer] medicare - auth approved 10/19/2023 - 01/11/2024  Number of visits: 16 Visit(s)    Authorization - Visit Number 5    Authorization - Number of Visits 16    Progress Note Due on Visit 10    PT Start Time 1216    PT Stop Time 1255    PT Time Calculation (min) 39 min    Activity Tolerance Patient tolerated treatment well    Behavior During Therapy WFL for tasks assessed/performed             Past Medical History:  Diagnosis Date   Anxiety    Breast cancer (HCC)    "right" (01/10/2014)   Chronic left shoulder pain    "work related injury" (01/10/2014)   Constipation 10/04/2014   Degenerative disc disease, cervical    Fibromyalgia    Full dentures    History of breast cancer in female 10/25/2013   Hot flashes, menopausal 04/04/2014   Hypertension    Osteoarthritis of both knees    Osteoporosis    Personal history of chemotherapy    PONV (postoperative nausea and vomiting)    Raynaud disease    Rheumatoid arthritis (HCC)    "elbows; fingers; toes"   Syncope and collapse    "4 times in the last year" (01/10/2014)   Wears glasses    Past Surgical History:  Procedure Laterality Date   BREAST BIOPSY Right 10/2013   BREAST BIOPSY Left 07/22/2023   MM LT BREAST BX W LOC DEV 1ST LESION IMAGE BX SPEC STEREO GUIDE 07/22/2023 [facility]   BREAST BIOPSY Left 07/22/2023   MM LT BREAST BX W LOC DEV EA AD LESION IMG BX SPEC STEREO GUIDE 07/22/2023 [facility]   BREAST IMPLANT EXCHANGE Right 02/12/2016   Procedure: EXCHANGE SILICONE IMPLANT ON RIGHT, CAPSULORRAPHY AND PLACEMENT OF ALLODERM;  Surgeon: Glenna Fellows, MD;  Location: [facility];   Service: Plastics;  Laterality: Right;   BREAST RECONSTRUCTION Right 12/12/2014   Procedure: REVISION OF RIGHT RECONSTRUCTIVE BREAST WITH CAPSULORRHAPHY PLACEMENT OF SILCONE IMPLANTS AND LIPOFILLING TO RIGHT CHEST ;  Surgeon: Glenna Fellows, MD;  Location: [facility];  Service: Plastics;  Laterality: Right;   BREAST RECONSTRUCTION Right 03/06/2015   Procedure: RIGHT NIPPLE AEROLA COMPLEX RECONSTRUCTION AND LOCAL FLAP WITH LIPO FILLING TO RIGHT BREAST,REMOVAL RIGHT CHEST PORT;  Surgeon: Glenna Fellows, MD;  Location: [facility];  Service: Plastics;  Laterality: Right;   BREAST RECONSTRUCTION Right 05/18/2018   Procedure: revision right breast reconstruction with silicone implant exchange;  Surgeon: Glenna Fellows, MD;  Location: [facility];  Service: Plastics;  Laterality: Right;   BREAST RECONSTRUCTION WITH PLACEMENT OF TISSUE EXPANDER AND FLEX HD (ACELLULAR HYDRATED DERMIS) Right 12/13/2013   Procedure: RIGHT BREAST RECONSTRUCTION WITH PLACEMENT OF TISSUE EXPANDER AND FLEX HD (ACELLULAR HYDRATED DERMIS);  Surgeon: Glenna Fellows, MD;  Location: [facility];  Service: Plastics;  Laterality: Right;   CHOLECYSTECTOMY  1970's   COLONOSCOPY     LIPOMA EXCISION Bilateral 12/13/2013   Procedure: EXCISION OF LEFT BREAST KELOID AND RIGHT CHEST KELOID;  Surgeon: Glenna Fellows, MD;  Location: [facility];  Service: Plastics;  Laterality: Bilateral;   LIPOSUCTION WITH LIPOFILLING Right 09/26/2014   Procedure: LIPOSUCTION WITH LIPOFILLING;  Surgeon: Glenna Fellows, MD;  Location: [facility];  Service: Plastics;  Laterality: Right;   LIPOSUCTION WITH LIPOFILLING Right 12/12/2014   Procedure: LIPOSUCTION WITH LIPOFILLING;  Surgeon: Glenna Fellows, MD;  Location: [facility];  Service: Plastics;  Laterality: Right;   LIPOSUCTION WITH LIPOFILLING Right 03/06/2015   Procedure: ABDOMINAL LIPOSUCTION WITH  LIPOFILLING TO RIGHT BREAST;  Surgeon: Glenna Fellows, MD;  Location: [facility];  Service: Plastics;  Laterality: Right;   MASTECTOMY Right 2015   MASTECTOMY W/ SENTINEL NODE BIOPSY Right 12/13/2013   Procedure: RIGHT TOTAL MASTECTOMY WITH SENTINEL LYMPH NODE BIOPSY;  Surgeon: Ernestene Mention, MD;  Location: [facility];  Service: General;  Laterality: Right;   MASTOPEXY Left 09/26/2014   Procedure: MASTOPEXY;  Surgeon: Glenna Fellows, MD;  Location: [facility];  Service: Plastics;  Laterality: Left;   PORT-A-CATH REMOVAL Right 03/06/2015   Procedure: REMOVAL PORT-A-CATH;  Surgeon: Glenna Fellows, MD;  Location: [facility];  Service: Plastics;  Laterality: Right;   PORTACATH PLACEMENT Right 12/13/2013   Procedure: INSERTION PORT-A-CATH;  Surgeon: Ernestene Mention, MD;  Location: [facility];  Service: General;  Laterality: Right;   REDUCTION MAMMAPLASTY Left 2015   REMOVAL OF BILATERAL TISSUE EXPANDERS WITH PLACEMENT OF BILATERAL BREAST IMPLANTS Bilateral 09/26/2014   Procedure: REMOVAL OF RIGHT TISSUE EXPANDER WITH PLACEMENT OF RIGHT BREAST IMPLANT LIPOFILLING TO RIGHT BREAST/LEFT BREAST MASTOPEXY FOR SYMMETRY;  Surgeon: Glenna Fellows, MD;  [facility];  Service: Plastics;  Laterality: Bilateral;   TISSUE EXPANDER PLACEMENT Right 01/10/2014   Procedure: Incision and Drainage Right Breast Seroma with TISSUE EXPANDER exchange;  Surgeon: Glenna Fellows, MD;  Location: [facility];  Service: Plastics;  Laterality: Right;   VAGINAL HYSTERECTOMY  1980   no [id number]   Patient Active Problem List   Diagnosis Date Noted   Essential (primary) hypertension 09/17/2023   Osteopenia of lumbar spine 09/15/2023   Elevated blood pressure reading without diagnosis of hypertension 09/15/2023   Mixed hyperlipidemia 04/27/2023   Chronic gout of foot 02/11/2023   Chronic kidney disease, stage 3b (HCC)  02/11/2023   Borderline diabetes 01/25/2023   Osteopenia of both hips 02/27/2015   Personal history of malignant neoplasm of breast 09/26/2014   Rheumatoid arthritis (HCC) 06/27/2014   Chemotherapy induced cardiomyopathy (HCC) 05/11/2014   Chemotherapy-induced neuropathy (HCC) 03/14/2014    PCP: Dulce Sellar, NP  REFERRING PROVIDER: Rodolph Bong, MD  REFERRING DIAG: .M54.42,M54.41,G89.29 (ICD-10-CM) - Chronic bilateral low back pain with bilateral sciatica  Rationale for Evaluation and Treatment: Rehabilitation  THERAPY DIAG:  Other low back pain  Muscle weakness (generalized)  Chronic pain of both shoulders  ONSET DATE: Nov 2023  SUBJECTIVE:  SUBJECTIVE STATEMENT: 11/02/2023 Feeling a little better. Mild stiffness in her shoulders and back. States that she has a bit of phlegm and doesn't know if she has bronchitis.    Eval:States that she has RA and she has had hip and back pain intermittently. States that there are days when she can hardly move. States that she has had neuropathy numbness in her hands and feet and this is going on today.   Works part time 3hrs a day with a client for [employer].    States the pain in November got worse. Some days are better than others. States heat takes the edge off. Ice hurts. Currently taking lyrical, tylenol, and was on prednisone.  Reports her shoulders have been hurting for  her off on and has difficutly washing hair.  Left arm is worse than the right. Patient is Right handed   PERTINENT HISTORY:  anxiety, fibromyalgia, HTN, RA, Hx R breast cancer 2015  PAIN:  Are you having pain? Yes: NPRS scale: 0/10 Pain location: b hip and shoulders Pain description: stiff  Aggravating factors: sitting for long periods of time Relieving  factors: heat  PRECAUTIONS: None  RED FLAGS: None   WEIGHT BEARING RESTRICTIONS: No  FALLS:  Has patient fallen in last 6 months? No     OCCUPATION: aide - part time  PLOF: Independent  PATIENT GOALS: to have less pain  NEXT MD VISIT: next week  OBJECTIVE:  Note: Objective measures were completed at Evaluation unless otherwise noted.  DIAGNOSTIC FINDINGS:  09/21/23 B hip xray FINDINGS: There is no evidence of hip fracture or dislocation. There is no evidence of arthropathy or other focal bone abnormality.   IMPRESSION: Negative.  PATIENT SURVEYS:  FOTO 24%  COGNITION: Overall cognitive status: Within functional limits for tasks assessed     SENSATION: Not tested    POSTURE:  grips glutes and standing, hips externally rotated and standing knees hyperextended with excessive lumbar lordosis noted  PALPATION: Tenderness to palpation along bilateral hips  LUMBAR ROM:   AROM eval  Flexion 50% limited  Extension 75% limited *  Right lateral flexion 75% limited *  Left lateral flexion 50% limited   Right rotation   Left rotation    (Blank rows = not tested)    UE Measurements Upper Extremity left EVAL Right  EVAL   A/PROM MMT A/PROM MMT  Shoulder Flexion /160*  /160*   Shoulder Extension      Shoulder Abduction /170*  /170*   Shoulder Adduction      Shoulder Internal Rotation /70*  /45*   Shoulder External Rotation /70*  /60*   Elbow Flexion      Elbow Extension      Wrist Flexion      Wrist Extension      Wrist Supination      Wrist Pronation      Wrist Ulnar Deviation      Wrist Radial Deviation      Grip Strength NA  NA     (Blank rows = not tested)   * pain   LE Measurements Lower Extremity Right EVAL Left EVAL   A/PROM MMT A/PROM MMT  Hip Flexion WFL*  WFL*   Hip Extension      Hip Abduction      Hip Adduction      Hip Internal rotation (tested supine) /15*  /10*   Hip External rotation (tested supine) /35*  /30*   Knee  Flexion  Knee Extension      Ankle Dorsiflexion      Ankle Plantarflexion      Ankle Inversion      Ankle Eversion       (Blank rows = not tested) * pain   LUMBAR SPECIAL TESTS:  L slump + , - SLR B  FUNCTIONAL TESTS:  Sit to stand: Bilateral use of upper extremities, pain, slow transitional movement  GAIT: Distance walked: 25 feet within clinic Assistive device utilized: None Level of assistance: Modified independence Comments: Wide base of support, Limited hip mobility, slow labored movements  TREATMENT DATE: 11/02/2023 Therapeutic Exercise:  Review of HEP and answering all questions Supine:     quad:   Seated:  shoulder flexion on pulleys 2 minutes, shoulder scaption on pulleys 2 minutes, low back flexion stretch with ball 2 minutes,shoulder ER YTB x2 rounds 5" holds 1 minute bouts  Standing: UBE 1 min forward 1 minute backward, step ups with contralateral knee drive and one hand support 2x10 5" holds B, side step ups 2x10 B one hand support, sholder flexion with dowel and posterior wall support 2x10 B Neuromuscular Re-education: Manual Therapy:   Therapeutic Activity: Self Care: Trigger Point Dry Needling:  Modalities: thermotherapy to lumbar and cervical spine during other treatment     PATIENT EDUCATION:  Education details: on HEP Person educated: Patient Education method: Explanation, Demonstration, and Handouts Education comprehension: verbalized understanding   HOME EXERCISE PROGRAM: [id number]  ASSESSMENT:  CLINICAL IMPRESSION: 11/02/2023 Focused on progressing exercises. Tolerated all exercises well but took rest breaks as needed for respiratory rate to catch up as exercising while in a mask as a litlte challenging for patient. Overall patient doing well and added all but step exercises to HEP. Will continue progressing strength and mobility as tolerated.   Eval: Patient presents to physical therapy with complaints of low back pain, bilateral hip and  leg pain as well as bilateral shoulder pain.  Symptoms have no mechanism of injury patient has been mobilizing with braces and using heat intermittently.  Pain has gradually increased over the last few weeks and was recommended to come here to help with her pain.  Patient has difficulties washing her hair, transitional movements in and out of chairs and walking around when her pain is really bad.  Patient presents with weakness, range of motion deficits and muscle guarding that is likely contributing to current presentation.  It is noted that upon end of session patient had improved transitional mobility and reported significantly less pain.  Patient would greatly benefit from skilled PT to improve overall function and quality of life.   OBJECTIVE IMPAIRMENTS: Abnormal gait, decreased activity tolerance, decreased balance, decreased mobility, difficulty walking, decreased ROM, decreased strength, impaired UE functional use, improper body mechanics, prosthetic dependency , and pain.   ACTIVITY LIMITATIONS: carrying, lifting, sitting, standing, squatting, stairs, transfers, bed mobility, dressing, reach over head, hygiene/grooming, locomotion level, and caring for others  PARTICIPATION LIMITATIONS: meal prep, cleaning, community activity, and occupation  PERSONAL FACTORS: Age, Fitness, and 3+ comorbidities: Bilateral neuropathy in hands and feet, RA, chronic knee pain  are also affecting patient's functional outcome.   REHAB POTENTIAL: Good  CLINICAL DECISION MAKING: Stable/uncomplicated  EVALUATION COMPLEXITY: Low   GOALS: Goals reviewed with patient? yes  SHORT TERM GOALS: Target date: 11/30/2023  Patient will be independent in self management strategies to improve quality of life and functional outcomes. Baseline: New Program Goal status: INITIAL  2.  Patient will report at least 50% improvement  in overall symptoms and/or function to demonstrate improved functional mobility Baseline: 0%  better Goal status: INITIAL  3.  Patient will be able to transition from sit to stand without the use of her hands and without pain to improve transitional movements Baseline: Unable Goal status: INITIAL     LONG TERM GOALS: Target date: 01/11/2024   Patient will report at least 75% improvement in overall symptoms and/or function to demonstrate improved functional mobility Baseline: 0% better Goal status: INITIAL  2.  Patient will improve score on FOTO outcomes measure to projected score to demonstrate overall improved function and QOL Baseline: see above Goal status: INITIAL  3.  Patient will demonstrate pain-free shoulder range of motion in both shoulders in all directions Baseline: Painful Goal status: INITIAL  4.  Patient will demonstrate pain-free hip range of motion in all directions in both hips Baseline: Painful Goal status: INITIAL   PLAN:  PT FREQUENCY: 1-2x/week for total of 16 visits over 12 weeks certification period  PT DURATION: 12 weeks  PLANNED INTERVENTIONS: 97110-Therapeutic exercises, 97530- Therapeutic activity, 97112- Neuromuscular re-education, 97535- Self Care, [id number]- Manual therapy, [phone]- Gait training, [phone]- Orthotic Fit/training, [phone]- Canalith repositioning, [id number]- Aquatic Therapy, 97014- Electrical stimulation (unattended), [phone]- Ionotophoresis 4mg /ml Dexamethasone, Patient/Family education, Balance training, Stair training, Taping, Dry Needling, Joint mobilization, Joint manipulation, Spinal manipulation, Spinal mobilization, Cryotherapy, and Moist heat   PLAN FOR NEXT SESSION: Assess balance, pain management strategies with heat and vibration, gentle range of motion exercises, strengthening once able  12:59 PM, 11/02/23 Tereasa Coop, DPT Physical Therapy with [facility]

## 2023-11-02 NOTE — Patient Instructions (Signed)
It was great to see you!  Start azithromcyin for your cough  Jerilynn Som may be used as needed for cough  If any new/worsening symptom(s), please follow-up with Judeth Cornfield   Take care,  Jarold Motto PA-C

## 2023-11-02 NOTE — Progress Notes (Signed)
Angela Gross is a 73 y.o. female here for a new problem.  History of Present Illness:   Chief Complaint  Patient presents with   Cough    Pt c/o cough since Friday, non-productive cough, chest congestion. Denies fever or chills.     Upper respiratory infection (URI) On Friday developed sore throat and cough She has had ongoing worsening cough over the weekend, leading to today Denies: chills, shortness of breath, ear pain, nausea and vomiting and diarrhea Did possibly feel "feverish" once over the weekend  She is taking Rinvoq    Past Medical History:  Diagnosis Date   Anxiety    Breast cancer (HCC)    "right" (01/10/2014)   Chronic left shoulder pain    "work related injury" (01/10/2014)   Constipation 10/04/2014   Degenerative disc disease, cervical    Fibromyalgia    Full dentures    History of breast cancer in female 10/25/2013   Hot flashes, menopausal 04/04/2014   Hypertension    Osteoarthritis of both knees    Osteoporosis    Personal history of chemotherapy    PONV (postoperative nausea and vomiting)    Raynaud disease    Rheumatoid arthritis (HCC)    "elbows; fingers; toes"   Syncope and collapse    "4 times in the last year" (01/10/2014)   Wears glasses      Social History   Tobacco Use   Smoking status: Never   Smokeless tobacco: Never  Vaping Use   Vaping status: Never Used  Substance Use Topics   Alcohol use: No   Drug use: No    Past Surgical History:  Procedure Laterality Date   BREAST BIOPSY Right 10/2013   BREAST BIOPSY Left 07/22/2023   MM LT BREAST BX W LOC DEV 1ST LESION IMAGE BX SPEC STEREO GUIDE 07/22/2023 [facility]   BREAST BIOPSY Left 07/22/2023   MM LT BREAST BX W LOC DEV EA AD LESION IMG BX SPEC STEREO GUIDE 07/22/2023 [facility]   BREAST IMPLANT EXCHANGE Right 02/12/2016   Procedure: EXCHANGE SILICONE IMPLANT ON RIGHT, CAPSULORRAPHY AND PLACEMENT OF ALLODERM;  Surgeon: Glenna Fellows, MD;  Location: [facility];  Service: Plastics;  Laterality: Right;   BREAST RECONSTRUCTION Right 12/12/2014   Procedure: REVISION OF RIGHT RECONSTRUCTIVE BREAST WITH CAPSULORRHAPHY PLACEMENT OF SILCONE IMPLANTS AND LIPOFILLING TO RIGHT CHEST ;  Surgeon: Glenna Fellows, MD;  Location: [facility];  Service: Plastics;  Laterality: Right;   BREAST RECONSTRUCTION Right 03/06/2015   Procedure: RIGHT NIPPLE AEROLA COMPLEX RECONSTRUCTION AND LOCAL FLAP WITH LIPO FILLING TO RIGHT BREAST,REMOVAL RIGHT CHEST PORT;  Surgeon: Glenna Fellows, MD;  Location: [facility];  Service: Plastics;  Laterality: Right;   BREAST RECONSTRUCTION Right 05/18/2018   Procedure: revision right breast reconstruction with silicone implant exchange;  Surgeon: Glenna Fellows, MD;  Location: [facility];  Service: Plastics;  Laterality: Right;   BREAST RECONSTRUCTION WITH PLACEMENT OF TISSUE EXPANDER AND FLEX HD (ACELLULAR HYDRATED DERMIS) Right 12/13/2013   Procedure: RIGHT BREAST RECONSTRUCTION WITH PLACEMENT OF TISSUE EXPANDER AND FLEX HD (ACELLULAR HYDRATED DERMIS);  Surgeon: Glenna Fellows, MD;  Location: [facility];  Service: Plastics;  Laterality: Right;   CHOLECYSTECTOMY  1970's   COLONOSCOPY     LIPOMA EXCISION Bilateral 12/13/2013   Procedure: EXCISION OF LEFT BREAST KELOID AND RIGHT CHEST KELOID;  Surgeon: Glenna Fellows, MD;  Location: [facility];  Service: Plastics;  Laterality: Bilateral;  LIPOSUCTION WITH LIPOFILLING Right 09/26/2014   Procedure: LIPOSUCTION WITH LIPOFILLING;  Surgeon: Glenna Fellows, MD;  Location: [facility];  Service: Plastics;  Laterality: Right;   LIPOSUCTION WITH LIPOFILLING Right 12/12/2014   Procedure: LIPOSUCTION WITH LIPOFILLING;  Surgeon: Glenna Fellows, MD;  Location: [facility];  Service: Plastics;  Laterality: Right;   LIPOSUCTION WITH LIPOFILLING Right 03/06/2015   Procedure: ABDOMINAL  LIPOSUCTION WITH LIPOFILLING TO RIGHT BREAST;  Surgeon: Glenna Fellows, MD;  Location: [facility];  Service: Plastics;  Laterality: Right;   MASTECTOMY Right 2015   MASTECTOMY W/ SENTINEL NODE BIOPSY Right 12/13/2013   Procedure: RIGHT TOTAL MASTECTOMY WITH SENTINEL LYMPH NODE BIOPSY;  Surgeon: Ernestene Mention, MD;  Location: [facility];  Service: General;  Laterality: Right;   MASTOPEXY Left 09/26/2014   Procedure: MASTOPEXY;  Surgeon: Glenna Fellows, MD;  Location: [facility];  Service: Plastics;  Laterality: Left;   PORT-A-CATH REMOVAL Right 03/06/2015   Procedure: REMOVAL PORT-A-CATH;  Surgeon: Glenna Fellows, MD;  Location: [facility];  Service: Plastics;  Laterality: Right;   PORTACATH PLACEMENT Right 12/13/2013   Procedure: INSERTION PORT-A-CATH;  Surgeon: Ernestene Mention, MD;  Location:  [facility];  Service: General;  Laterality: Right;   REDUCTION MAMMAPLASTY Left 2015   REMOVAL OF BILATERAL TISSUE EXPANDERS WITH PLACEMENT OF BILATERAL BREAST IMPLANTS Bilateral 09/26/2014   Procedure: REMOVAL OF RIGHT TISSUE EXPANDER WITH PLACEMENT OF RIGHT BREAST IMPLANT LIPOFILLING TO RIGHT BREAST/LEFT BREAST MASTOPEXY FOR SYMMETRY;  Surgeon: Glenna Fellows, MD;  Location:  [facility];  Service: Plastics;  Laterality: Bilateral;   TISSUE EXPANDER PLACEMENT Right 01/10/2014   Procedure: Incision and Drainage Right Breast Seroma with TISSUE EXPANDER exchange;  Surgeon: Glenna Fellows, MD;  Location: [facility];  Service: Plastics;  Laterality: Right;   VAGINAL HYSTERECTOMY  1980   no [id number]    Family History  Problem Relation Age of Onset   Heart disease Mother    Hypertension Mother    Breast cancer Mother    Diabetes Mother    Congestive Heart Failure Mother    Glaucoma Mother    Breast cancer Sister     Allergies  Allergen Reactions   Codeine Hives and Other (See Comments)    Altered  mental status, numbness    Diazepam Other (See Comments)    Delusions, hallucinations     Current Medications:   Current Outpatient Medications:    ACCU-CHEK GUIDE test strip, USE TO TEST BLOOD SUGARS ONCE WEEKLY, Disp: , Rfl:    atorvastatin (LIPITOR) 20 MG tablet, Take 1 tablet (20 mg total) by mouth daily., Disp: , Rfl:    azithromycin (ZITHROMAX) 250 MG tablet, Take 2 tablets on day 1, then 1 tablet daily on days 2 through 5, Disp: 6 tablet, Rfl: 0   B Complex-C (B-COMPLEX WITH VITAMIN C) tablet, Take 1 tablet by mouth daily., Disp: , Rfl:    benzonatate (TESSALON PERLES) 100 MG capsule, Take 1 capsule (100 mg total) by mouth 3 (three) times daily as needed for cough., Disp: 30 capsule, Rfl: 1   colchicine 0.6 MG tablet, Take 1 tablet (0.6 mg total) by mouth daily as needed (For Gout pain. Take for 5 days only.)., Disp: 30 tablet, Rfl: 0   febuxostat (ULORIC) 40 MG tablet, TAKE 1 TABLET (40 MG TOTAL) BY MOUTH DAILY., Disp: 90 tablet, Rfl: 1   fluticasone (FLONASE) 50 MCG/ACT nasal spray, Place into both nostrils., Disp: , Rfl:  furosemide (LASIX) 20 MG tablet, Take 20 mg by mouth daily as needed for fluid., Disp: , Rfl:    hydroxychloroquine (PLAQUENIL) 200 MG tablet, 1 tab Orally twice a day for 30 days, Disp: , Rfl:    losartan (COZAAR) 25 MG tablet, Take 1 tablet (25 mg total) by mouth daily. OK to take an extra pill if you feel your blood pressure is high., Disp: 90 tablet, Rfl: 1   Olopatadine HCl 0.2 % SOLN, Apply 1 drop to eye daily., Disp: 2.5 mL, Rfl: 3   potassium chloride SA (KLOR-CON M) 20 MEQ tablet, Take 1 tablet (20 mEq total) by mouth as directed. Take each day you take a Lasix pill., Disp: , Rfl:    pregabalin (LYRICA) 50 MG capsule, Take 1 capsule (50 mg total) by mouth 3 (three) times daily., Disp: 60 capsule, Rfl: 2   TRULICITY 1.5 MG/0.5ML SOAJ, INJECT 1.5 MG INTO THE SKIN ONCE A WEEK., Disp: 2 mL, Rfl: 1   Upadacitinib ER (RINVOQ) 15 MG TB24, Take by mouth., Disp:  , Rfl:    Vitamin D, Cholecalciferol, 25 MCG (1000 UT) CAPS, Take 25 mcg by mouth daily., Disp: , Rfl:    Review of Systems:   Negative unless otherwise specified per HPI.  Vitals:   Vitals:   11/02/23 1303  BP: 124/80  Pulse: 92  Temp: (!) 97.2 F (36.2 C)  TempSrc: Temporal  SpO2: 98%  Weight: 164 lb (74.4 kg)  Height: 5' (1.524 m)     Body mass index is 32.03 kg/m.  Physical Exam:   Physical Exam Vitals and nursing note reviewed.  Constitutional:      General: She is not in acute distress.    Appearance: She is well-developed. She is not ill-appearing or toxic-appearing.  HENT:     Head: Normocephalic and atraumatic.     Right Ear: Tympanic membrane, ear canal and external ear normal. Tympanic membrane is not erythematous, retracted or bulging.     Left Ear: Tympanic membrane, ear canal and external ear normal. Tympanic membrane is not erythematous, retracted or bulging.     Nose: Nose normal.     Right Sinus: No maxillary sinus tenderness or frontal sinus tenderness.     Left Sinus: No maxillary sinus tenderness or frontal sinus tenderness.     Mouth/Throat:     Pharynx: Uvula midline. No posterior oropharyngeal erythema.  Eyes:     General: Lids are normal.     Conjunctiva/sclera: Conjunctivae normal.  Neck:     Trachea: Trachea normal.  Cardiovascular:     Rate and Rhythm: Normal rate and regular rhythm.     Heart sounds: Normal heart sounds, S1 normal and S2 normal.  Pulmonary:     Effort: Pulmonary effort is normal.     Breath sounds: Normal breath sounds. No decreased breath sounds, wheezing, rhonchi or rales.  Lymphadenopathy:     Cervical: No cervical adenopathy.  Skin:    General: Skin is warm and dry.  Neurological:     Mental Status: She is alert.  Psychiatric:        Speech: Speech normal.        Behavior: Behavior normal. Behavior is cooperative.    Results for orders placed or performed in visit on 11/02/23  POC COVID-19  Result Value  Ref Range   SARS Coronavirus 2 Ag Negative Negative  POCT rapid strep A  Result Value Ref Range   Rapid Strep A Screen Negative Negative  Assessment and Plan:   1. Acute cough (Primary) - POC COVID-19 - POCT rapid strep A   No red flags on exam.   Will initiate azithromycin and tessalon Perles per orders.  Discussed taking medications as prescribed.  Reviewed return precautions including new or worsening fever, SOB, new or worsening cough or other concerns.  Push fluids and rest.  I recommend that patient follow-up if symptoms worsen or persist despite treatment x 7-10 days, sooner if needed.   Jarold Motto, PA-C

## 2023-11-04 ENCOUNTER — Encounter: Payer: Medicare Other | Admitting: Physical Therapy

## 2023-11-10 ENCOUNTER — Ambulatory Visit (INDEPENDENT_AMBULATORY_CARE_PROVIDER_SITE_OTHER): Payer: Medicare Other

## 2023-11-10 VITALS — Ht 59.0 in | Wt 164.0 lb

## 2023-11-10 DIAGNOSIS — Z0000 Encounter for general adult medical examination without abnormal findings: Secondary | ICD-10-CM | POA: Diagnosis not present

## 2023-11-10 NOTE — Patient Instructions (Addendum)
Ms. Angela Gross , Thank you for taking time to come for your Medicare Wellness Visit. I appreciate your ongoing commitment to your health goals. Please review the following plan we discussed and let me know if I can assist you in the future.   Referrals/Orders/Follow-Ups/Clinician Recommendations: Aim for 30 minutes of exercise or brisk walking, 6-8 glasses of water, and 5 servings of fruits and vegetables each day.   Vaccinations: declines all Influenza vaccine: recommend every Fall Pneumococcal vaccine: recommend once per lifetime Prevnar-20 Tdap vaccine: recommend every 10 years Shingles vaccine: recommend Shingrix which is 2 doses 2-6 months apart and over 90% effective     Covid-19: recommend 2 doses one month apart with a booster 6 months later   This is a list of the screening recommended for you and due dates:  Health Maintenance  Topic Date Due   Medicare Annual Wellness Visit  03/05/2019   Hemoglobin A1C  08/05/2023   Eye exam for diabetics  01/05/2024*   Pneumonia Vaccine (2 of 2 - PCV) 03/15/2024*   Complete foot exam   03/15/2024   Yearly kidney health urinalysis for diabetes  04/26/2024   Yearly kidney function blood test for diabetes  09/14/2024   Mammogram  07/13/2025   Colon Cancer Screening  03/18/2033   DEXA scan (bone density measurement)  Completed   Hepatitis C Screening  Completed   HPV Vaccine  Aged Out   DTaP/Tdap/Td vaccine  Discontinued   Flu Shot  Discontinued   COVID-19 Vaccine  Discontinued   Zoster (Shingles) Vaccine  Discontinued  *Topic was postponed. The date shown is not the original due date.    Advanced directives: (Declined) Advance directive discussed with you today. Even though you declined this today, please call our office should you change your mind, and we can give you the proper paperwork for you to fill out.  Next Medicare Annual Wellness Visit scheduled for next year: Yes

## 2023-11-10 NOTE — Progress Notes (Signed)
Subjective:   Angela Gross is a 73 y.o. female who presents for an Initial Medicare Annual Wellness Visit.  Visit Complete: Virtual I connected with  Angela Gross on 11/10/23 by a audio enabled telemedicine application and verified that I am speaking with the correct person using two identifiers.  Patient Location: Home  Provider Location: Office/Clinic  I discussed the limitations of evaluation and management by telemedicine. The patient expressed understanding and agreed to proceed.  Vital Signs: Because this visit was a virtual/telehealth visit, some criteria may be missing or patient reported. Any vitals not documented were not able to be obtained and vitals that have been documented are patient reported.  Cardiac Risk Factors include: advanced age (>34men, >24 women);dyslipidemia;hypertension;obesity (BMI >30kg/m2)     Objective:    Today's Vitals   11/10/23 1505 11/10/23 1506  Weight: 164 lb (74.4 kg)   Height: 4\' 11"  (1.499 m)   PainSc:  8    Body mass index is 33.12 kg/m.     11/10/2023    3:43 PM 10/19/2023    2:06 PM 06/03/2019    1:30 PM 05/18/2018    6:38 AM 04/30/2018    4:58 PM 04/26/2018   10:54 AM 08/02/2017    9:20 AM  Advanced Directives  Does Patient Have a Medical Advance Directive? No No No No No No Yes  Type of Tax inspector;Living will  Would patient like information on creating a medical advance directive? No - Patient declined  No - Patient declined No - Patient declined No - Patient declined No - Patient declined     Current Medications (verified) Outpatient Encounter Medications as of 11/10/2023  Medication Sig   ACCU-CHEK GUIDE test strip USE TO TEST BLOOD SUGARS ONCE WEEKLY   atorvastatin (LIPITOR) 20 MG tablet Take 1 tablet (20 mg total) by mouth daily.   B Complex-C (B-COMPLEX WITH VITAMIN C) tablet Take 1 tablet by mouth daily.   benzonatate (TESSALON PERLES) 100 MG capsule Take 1 capsule (100  mg total) by mouth 3 (three) times daily as needed for cough.   colchicine 0.6 MG tablet Take 1 tablet (0.6 mg total) by mouth daily as needed (For Gout pain. Take for 5 days only.).   febuxostat (ULORIC) 40 MG tablet TAKE 1 TABLET (40 MG TOTAL) BY MOUTH DAILY.   fluticasone (FLONASE) 50 MCG/ACT nasal spray Place into both nostrils.   furosemide (LASIX) 20 MG tablet Take 20 mg by mouth daily as needed for fluid.   hydroxychloroquine (PLAQUENIL) 200 MG tablet 1 tab Orally twice a day for 30 days   losartan (COZAAR) 25 MG tablet Take 1 tablet (25 mg total) by mouth daily. OK to take an extra pill if you feel your blood pressure is high.   Olopatadine HCl 0.2 % SOLN Apply 1 drop to eye daily.   potassium chloride SA (KLOR-CON M) 20 MEQ tablet Take 1 tablet (20 mEq total) by mouth as directed. Take each day you take a Lasix pill.   pregabalin (LYRICA) 50 MG capsule Take 1 capsule (50 mg total) by mouth 3 (three) times daily.   TRULICITY 1.5 MG/0.5ML SOAJ INJECT 1.5 MG INTO THE SKIN ONCE A WEEK.   Upadacitinib ER (RINVOQ) 15 MG TB24 Take by mouth.   Vitamin D, Cholecalciferol, 25 MCG (1000 UT) CAPS Take 25 mcg by mouth daily.   No facility-administered encounter medications on file as of 11/10/2023.  Allergies (verified) Codeine and Diazepam   History: Past Medical History:  Diagnosis Date   Anxiety    Breast cancer (HCC)    "right" (01/10/2014)   Chronic left shoulder pain    "work related injury" (01/10/2014)   Constipation 10/04/2014   Degenerative disc disease, cervical    Fibromyalgia    Full dentures    History of breast cancer in female 10/25/2013   Hot flashes, menopausal 04/04/2014   Hypertension    Osteoarthritis of both knees    Osteoporosis    Personal history of chemotherapy    PONV (postoperative nausea and vomiting)    Raynaud disease    Rheumatoid arthritis (HCC)    "elbows; fingers; toes"   Syncope and collapse    "4 times in the last year" (01/10/2014)   Wears  glasses    Past Surgical History:  Procedure Laterality Date   BREAST BIOPSY Right 10/2013   BREAST BIOPSY Left 07/22/2023   MM LT BREAST BX W LOC DEV 1ST LESION IMAGE BX SPEC STEREO GUIDE 07/22/2023 [facility]   BREAST BIOPSY Left 07/22/2023   MM LT BREAST BX W LOC DEV EA AD LESION IMG BX SPEC STEREO GUIDE 07/22/2023 [facility]   BREAST IMPLANT EXCHANGE Right 02/12/2016   Procedure: EXCHANGE SILICONE IMPLANT ON RIGHT, CAPSULORRAPHY AND PLACEMENT OF ALLODERM;  Surgeon: Glenna Fellows, MD;  Location: [facility];  Service: Plastics;  Laterality: Right;   BREAST RECONSTRUCTION Right 12/12/2014   Procedure: REVISION OF RIGHT RECONSTRUCTIVE BREAST WITH CAPSULORRHAPHY PLACEMENT OF SILCONE IMPLANTS AND LIPOFILLING TO RIGHT CHEST ;  Surgeon: Glenna Fellows, MD;  Location: [facility];  Service: Plastics;  Laterality: Right;   BREAST RECONSTRUCTION Right 03/06/2015   Procedure: RIGHT NIPPLE AEROLA COMPLEX RECONSTRUCTION AND LOCAL FLAP WITH LIPO FILLING TO RIGHT BREAST,REMOVAL RIGHT CHEST PORT;  Surgeon: Glenna Fellows, MD;  Location: [facility];  Service: Plastics;  Laterality: Right;   BREAST RECONSTRUCTION Right 05/18/2018   Procedure: revision right breast reconstruction with silicone implant exchange;  Surgeon: Glenna Fellows, MD;  Location: [facility];  Service: Plastics;  Laterality: Right;   BREAST RECONSTRUCTION WITH PLACEMENT OF TISSUE EXPANDER AND FLEX HD (ACELLULAR HYDRATED DERMIS) Right 12/13/2013   Procedure: RIGHT BREAST RECONSTRUCTION WITH PLACEMENT OF TISSUE EXPANDER AND FLEX HD (ACELLULAR HYDRATED DERMIS);  Surgeon: Glenna Fellows, MD;  Location: [facility];  Service: Plastics;  Laterality: Right;   CHOLECYSTECTOMY  1970's   COLONOSCOPY     LIPOMA EXCISION Bilateral 12/13/2013   Procedure: EXCISION OF LEFT BREAST KELOID AND RIGHT CHEST KELOID;  Surgeon: Glenna Fellows, MD;  Location: [facility];  Service: Plastics;  Laterality: Bilateral;   LIPOSUCTION WITH LIPOFILLING Right 09/26/2014   Procedure: LIPOSUCTION WITH LIPOFILLING;  Surgeon: Glenna Fellows, MD;  Location: [facility];  Service: Plastics;  Laterality: Right;   LIPOSUCTION WITH LIPOFILLING Right 12/12/2014   Procedure: LIPOSUCTION WITH LIPOFILLING;  Surgeon: Glenna Fellows, MD;  Location: [facility];  Service: Plastics;  Laterality: Right;   LIPOSUCTION WITH LIPOFILLING Right 03/06/2015   Procedure: ABDOMINAL LIPOSUCTION WITH LIPOFILLING TO RIGHT BREAST;  Surgeon: Glenna Fellows, MD;  Location: [facility];  Service: Plastics;  Laterality: Right;   MASTECTOMY Right 2015   MASTECTOMY W/ SENTINEL NODE BIOPSY Right 12/13/2013   Procedure: RIGHT TOTAL MASTECTOMY WITH SENTINEL LYMPH NODE BIOPSY;  Surgeon: Ernestene Mention, MD;  Location: [facility];  Service: General;  Laterality: Right;  MASTOPEXY Left 09/26/2014   Procedure: MASTOPEXY;  Surgeon: Glenna Fellows, MD;  Location: [facility];  Service: Plastics;  Laterality: Left;   PORT-A-CATH REMOVAL Right 03/06/2015   Procedure: REMOVAL PORT-A-CATH;  Surgeon: Glenna Fellows, MD;  Location: [facility];  Service: Plastics;  Laterality: Right;   PORTACATH PLACEMENT Right 12/13/2013   Procedure: INSERTION PORT-A-CATH;  Surgeon: Ernestene Mention, MD;  Location: [facility];  Service: General;  Laterality: Right;   REDUCTION MAMMAPLASTY Left 2015   REMOVAL OF BILATERAL TISSUE EXPANDERS WITH PLACEMENT OF BILATERAL BREAST IMPLANTS Bilateral 09/26/2014   Procedure: REMOVAL OF RIGHT TISSUE EXPANDER WITH PLACEMENT OF RIGHT BREAST IMPLANT LIPOFILLING TO RIGHT BREAST/LEFT BREAST MASTOPEXY FOR SYMMETRY;  Surgeon: Glenna Fellows, MD;  [facility];  Service: Plastics;  Laterality: Bilateral;   TISSUE EXPANDER PLACEMENT Right 01/10/2014   Procedure:  Incision and Drainage Right Breast Seroma with TISSUE EXPANDER exchange;  Surgeon: Glenna Fellows, MD;  Location: [facility];  Service: Plastics;  Laterality: Right;   VAGINAL HYSTERECTOMY  1980   no salpingo-oophoretomu   Family History  Problem Relation Age of Onset   Heart disease Mother    Hypertension Mother    Breast cancer Mother    Diabetes Mother    Congestive Heart Failure Mother    Glaucoma Mother    Breast cancer Sister    Social History   Socioeconomic History   Marital status: Divorced    Spouse name: Not on file   Number of children: 2   Years of education: HS   Highest education level: Not on file  Occupational History   Occupation: Unemployed  Tobacco Use   Smoking status: Never   Smokeless tobacco: Never  Vaping Use   Vaping status: Never Used  Substance and Sexual Activity   Alcohol use: No   Drug use: No   Sexual activity: Not Currently  Other Topics Concern   Not on file  Social History Narrative   Lives at home with husband.   Right-handed.   No caffeine use.   Social Drivers of Corporate investment banker Strain: Low Risk  (11/10/2023)   Overall Financial Resource Strain (CARDIA)    Difficulty of Paying Living Expenses: Not hard at all  Food Insecurity: No Food Insecurity (11/10/2023)   Hunger Vital Sign    Worried About Running Out of Food in the Last Year: Never true    Ran Out of Food in the Last Year: Never true  Transportation Needs: No Transportation Needs (11/10/2023)   PRAPARE - Administrator, Civil Service (Medical): No    Lack of Transportation (Non-Medical): No  Physical Activity: Insufficiently Active (11/10/2023)   Exercise Vital Sign    Days of Exercise per Week: 2 days    Minutes of Exercise per Session: 50 min  Stress: No Stress Concern Present (11/10/2023)   [employer] of Occupational Health - Occupational Stress Questionnaire    Feeling of Stress : Not at all  Social Connections: Moderately Isolated  (11/10/2023)   Social Connection and Isolation Panel [NHANES]    Frequency of Communication with Friends and Family: More than three times a week    Frequency of Social Gatherings with Friends and Family: More than three times a week    Attends Religious Services: 1 to 4 times per year    Active Member of [employer] or Organizations: No    Attends Banker Meetings: Never    Marital  Status: Divorced    Tobacco Counseling Counseling given: Not Answered   Clinical Intake:  Pre-visit preparation completed: Yes  Pain : 0-10 Pain Score: 8  (arthritis - taking prescribed meds; eye discomfort using cold compresses) Pain Type: Acute pain Pain Location: Generalized (eye as well) Pain Descriptors / Indicators: Aching Pain Onset: In the past 7 days Pain Frequency: Constant     BMI - recorded: 33.12 Nutritional Status: BMI > 30  Obese Diabetes: No  How often do you need to have someone help you when you read instructions, pamphlets, or other written materials from your doctor or pharmacy?: 1 - Never  Interpreter Needed?: No  Information entered by :: Hassell Halim, CMA   Activities of Daily Living    11/10/2023    3:09 PM  In your present state of health, do you have any difficulty performing the following activities:  Hearing? 0  Vision? 0  Difficulty concentrating or making decisions? 0  Walking or climbing stairs? 0  Dressing or bathing? 0  Doing errands, shopping? 0  Preparing Food and eating ? N  Using the Toilet? N  In the past six months, have you accidently leaked urine? N  Do you have problems with loss of bowel control? N  Managing your Medications? N  Managing your Finances? N  Housekeeping or managing your Housekeeping? N    Patient Care Team: Dulce Sellar, NP as PCP - General (Family Medicine) Claud Kelp, MD as Consulting Physician (General Surgery)  Indicate any recent Medical Services you may have received from other than [facility]  providers in the past year (date may be approximate).     Assessment:   This is a routine wellness examination for Angela Gross.  Hearing/Vision screen Hearing Screening - Comments:: Denies any hearing  issues  Vision Screening - Comments:: Pt follows up with my eye dr for annual eye exams    Goals Addressed   None    Depression Screen    11/10/2023    3:12 PM 03/16/2023   10:25 AM 01/22/2023    9:25 AM  PHQ 2/9 Scores  PHQ - 2 Score 0 0 0  PHQ- 9 Score  2     Fall Risk    11/10/2023    3:14 PM 03/16/2023   10:27 AM 01/22/2023    9:25 AM 04/25/2014   10:27 AM  Fall Risk   Falls in the past year? 0 0 0 Yes  Number falls in past yr: 0 0 0 1  Injury with Fall? 0 0 0 No  Risk for fall due to : No Fall Risks No Fall Risks No Fall Risks History of fall(s)  Follow up Falls prevention discussed Falls evaluation completed Education provided;Falls evaluation completed     MEDICARE RISK AT HOME: Medicare Risk at Home If so, are there any without handrails?: No Home free of loose throw rugs in walkways, pet beds, electrical cords, etc?: Yes Adequate lighting in your home to reduce risk of falls?: Yes Life alert?: No Use of a cane, walker or w/c?: No Grab bars in the bathroom?: No Shower chair or bench in shower?: No Elevated toilet seat or a handicapped toilet?: No  TIMED UP AND GO:  Was the test performed? Yes  Length of time to ambulate 10 feet: 10 sec   Cognitive Function:        11/10/2023    3:15 PM  6CIT Screen  What Year? 0 points  What month? 0 points  What time? 0  points  Count back from 20 0 points  Months in reverse 4 points  Repeat phrase 4 points  Total Score 8 points    Immunizations Immunization History  Administered Date(s) Administered   Influenza Inj Mdck Quad With Preservative 06/22/2017   Influenza,inj,Quad PF,6+ Mos 09/12/2015   Influenza-Unspecified 06/13/2013   Pneumococcal Polysaccharide-23 01/02/2017    TDAP status: Due, Education has  been provided regarding the importance of this vaccine. Advised may receive this vaccine at local pharmacy or Health Dept. Aware to provide a copy of the vaccination record if obtained from local pharmacy or Health Dept. Verbalized acceptance and understanding.  Flu Vaccine status: Due, Education has been provided regarding the importance of this vaccine. Advised may receive this vaccine at local pharmacy or Health Dept. Aware to provide a copy of the vaccination record if obtained from local pharmacy or Health Dept. Verbalized acceptance and understanding.  Pneumococcal vaccine status: Due, Education has been provided regarding the importance of this vaccine. Advised may receive this vaccine at local pharmacy or Health Dept. Aware to provide a copy of the vaccination record if obtained from local pharmacy or Health Dept. Verbalized acceptance and understanding.  Covid-19 vaccine status: Declined, Education has been provided regarding the importance of this vaccine but patient still declined. Advised may receive this vaccine at local pharmacy or Health Dept.or vaccine clinic. Aware to provide a copy of the vaccination record if obtained from local pharmacy or Health Dept. Verbalized acceptance and understanding.  Qualifies for Shingles Vaccine? No   Zostavax completed No   Shingrix Completed?: No.    Education has been provided regarding the importance of this vaccine. Patient has been advised to call insurance company to determine out of pocket expense if they have not yet received this vaccine. Advised may also receive vaccine at local pharmacy or Health Dept. Verbalized acceptance and understanding.  Screening Tests Health Maintenance  Topic Date Due   HEMOGLOBIN A1C  08/05/2023   OPHTHALMOLOGY EXAM  01/05/2024 (Originally 06/02/1961)   Pneumonia Vaccine 39+ Years old (2 of 2 - PCV) 03/15/2024 (Originally 01/02/2018)   FOOT EXAM  03/15/2024   Diabetic kidney evaluation - Urine ACR  04/26/2024    Diabetic kidney evaluation - eGFR measurement  09/14/2024   Medicare Annual Wellness (AWV)  11/09/2024   MAMMOGRAM  07/13/2025   Colonoscopy  03/18/2033   DEXA SCAN  Completed   Hepatitis C Screening  Completed   HPV VACCINES  Aged Out   DTaP/Tdap/Td  Discontinued   INFLUENZA VACCINE  Discontinued   COVID-19 Vaccine  Discontinued   Zoster Vaccines- Shingrix  Discontinued    Health Maintenance  Health Maintenance Due  Topic Date Due   HEMOGLOBIN A1C  08/05/2023   Colonoscopy to be completed every 10 years. Mammogram status: Completed 07/14/2023. Repeat every year  Bone Density status: Completed 09/02/23. Results reflect: Bone density results: OSTEOPENIA. Repeat every 2 years.  Additional Screening:  Hepatitis C Screening: Completed 09/18/15  Vision Screening: Recommended annual ophthalmology exams for early detection of glaucoma and other disorders of the eye. Is the patient up to date with their annual eye exam? No Who is the provider or what is the name of the office in which the patient attends annual eye exams? [facility]  If pt is not established with a provider, would they like to be referred to a provider to establish care? No .   Dental Screening: Recommended annual dental exams for proper oral hygiene  Community Resource Referral / Chronic  Care Management: CRR required this visit?  No   CCM required this visit?  No     Plan:     I have personally reviewed and noted the following in the patient's chart:   Medical and social history Use of alcohol, tobacco or illicit drugs  Current medications and supplements including opioid prescriptions. Patient is not currently taking opioid prescriptions. Functional ability and status Nutritional status Physical activity Advanced directives List of other physicians Hospitalizations, surgeries, and ER visits in previous 12 months Vitals Screenings to include cognitive, depression, and falls Referrals and  appointments  In addition, I have reviewed and discussed with patient certain preventive protocols, quality metrics, and best practice recommendations. A written personalized care plan for preventive services as well as general preventive health recommendations were provided to patient.     Marzella Schlein, LPN   [id number]/61/0960   After Visit Summary: (MyChart) Due to this being a telephonic visit, the after visit summary with patients personalized plan was offered to patient via MyChart   Nurse Notes: none

## 2023-11-13 ENCOUNTER — Other Ambulatory Visit: Payer: Self-pay | Admitting: Family

## 2023-11-13 DIAGNOSIS — G620 Drug-induced polyneuropathy: Secondary | ICD-10-CM

## 2023-11-13 DIAGNOSIS — R7303 Prediabetes: Secondary | ICD-10-CM

## 2023-11-13 NOTE — Telephone Encounter (Signed)
so I asked Essica to follow up in a month after starting medication so I can check her kidney function, needs to schedule a visit please

## 2023-11-16 ENCOUNTER — Encounter: Payer: Self-pay | Admitting: Physical Therapy

## 2023-11-16 ENCOUNTER — Ambulatory Visit: Payer: Medicare Other | Admitting: Physical Therapy

## 2023-11-16 DIAGNOSIS — M5459 Other low back pain: Secondary | ICD-10-CM | POA: Diagnosis not present

## 2023-11-16 DIAGNOSIS — G8929 Other chronic pain: Secondary | ICD-10-CM

## 2023-11-16 DIAGNOSIS — M6281 Muscle weakness (generalized): Secondary | ICD-10-CM | POA: Diagnosis not present

## 2023-11-16 DIAGNOSIS — M25512 Pain in left shoulder: Secondary | ICD-10-CM

## 2023-11-16 DIAGNOSIS — M25511 Pain in right shoulder: Secondary | ICD-10-CM | POA: Diagnosis not present

## 2023-11-16 NOTE — Therapy (Signed)
OUTPATIENT PHYSICAL THERAPY THORACOLUMBAR Treatment   Patient Name: Angela Gross MRN: [id number] DOB:03/22/1951, 73 y.o., female Today's Date: 11/16/2023  END OF SESSION:  PT End of Session - 11/16/23 1301     Visit Number 6    Number of Visits 16    Date for PT Re-Evaluation 01/11/24    Authorization Type [employer] medicare - auth approved 10/19/2023 - 01/11/2024  Number of visits: 16 Visit(s)    Authorization - Visit Number 6    Authorization - Number of Visits 16    Progress Note Due on Visit 10    PT Start Time 1303    PT Stop Time 1341    PT Time Calculation (min) 38 min    Activity Tolerance Patient tolerated treatment well    Behavior During Therapy [facility] for tasks assessed/performed             Past Medical History:  Diagnosis Date   Anxiety    Breast cancer (HCC)    "right" (01/10/2014)   Chronic left shoulder pain    "work related injury" (01/10/2014)   Constipation 10/04/2014   Degenerative disc disease, cervical    Fibromyalgia    Full dentures    History of breast cancer in female 10/25/2013   Hot flashes, menopausal 04/04/2014   Hypertension    Osteoarthritis of both knees    Osteoporosis    Personal history of chemotherapy    PONV (postoperative nausea and vomiting)    Raynaud disease    Rheumatoid arthritis (HCC)    "elbows; fingers; toes"   Syncope and collapse    "4 times in the last year" (01/10/2014)   Wears glasses    Past Surgical History:  Procedure Laterality Date   BREAST BIOPSY Right 10/2013   BREAST BIOPSY Left 07/22/2023   MM LT BREAST BX W LOC DEV 1ST LESION IMAGE BX SPEC STEREO GUIDE 07/22/2023 [facility]   BREAST BIOPSY Left 07/22/2023   MM LT BREAST BX W LOC DEV EA AD LESION IMG BX SPEC STEREO GUIDE 07/22/2023 [facility]   BREAST IMPLANT EXCHANGE Right 02/12/2016   Procedure: EXCHANGE SILICONE IMPLANT ON RIGHT, CAPSULORRAPHY AND PLACEMENT OF ALLODERM;  Surgeon: Glenna Fellows, MD;  Location: [facility];   Service: Plastics;  Laterality: Right;   BREAST RECONSTRUCTION Right 12/12/2014   Procedure: REVISION OF RIGHT RECONSTRUCTIVE BREAST WITH CAPSULORRHAPHY PLACEMENT OF SILCONE IMPLANTS AND LIPOFILLING TO RIGHT CHEST ;  Surgeon: Glenna Fellows, MD;  Location: [facility];  Service: Plastics;  Laterality: Right;   BREAST RECONSTRUCTION Right 03/06/2015   Procedure: RIGHT NIPPLE AEROLA COMPLEX RECONSTRUCTION AND LOCAL FLAP WITH LIPO FILLING TO RIGHT BREAST,REMOVAL RIGHT CHEST PORT;  Surgeon: Glenna Fellows, MD;  Location: [facility];  Service: Plastics;  Laterality: Right;   BREAST RECONSTRUCTION Right 05/18/2018   Procedure: revision right breast reconstruction with silicone implant exchange;  Surgeon: Glenna Fellows, MD;  Location: [facility];  Service: Plastics;  Laterality: Right;   BREAST RECONSTRUCTION WITH PLACEMENT OF TISSUE EXPANDER AND FLEX HD (ACELLULAR HYDRATED DERMIS) Right 12/13/2013   Procedure: RIGHT BREAST RECONSTRUCTION WITH PLACEMENT OF TISSUE EXPANDER AND FLEX HD (ACELLULAR HYDRATED DERMIS);  Surgeon: Glenna Fellows, MD;  Location: [facility];  Service: Plastics;  Laterality: Right;   CHOLECYSTECTOMY  1970's   COLONOSCOPY     LIPOMA EXCISION Bilateral 12/13/2013   Procedure: EXCISION OF LEFT BREAST KELOID AND RIGHT CHEST KELOID;  Surgeon: Glenna Fellows, MD;  Location: [facility];  Service: Plastics;  Laterality: Bilateral;   LIPOSUCTION WITH LIPOFILLING Right 09/26/2014   Procedure: LIPOSUCTION WITH LIPOFILLING;  Surgeon: Glenna Fellows, MD;  Location: [facility];  Service: Plastics;  Laterality: Right;   LIPOSUCTION WITH LIPOFILLING Right 12/12/2014   Procedure: LIPOSUCTION WITH LIPOFILLING;  Surgeon: Glenna Fellows, MD;  Location: [facility];  Service: Plastics;  Laterality: Right;   LIPOSUCTION WITH LIPOFILLING Right 03/06/2015   Procedure: ABDOMINAL LIPOSUCTION WITH  LIPOFILLING TO RIGHT BREAST;  Surgeon: Glenna Fellows, MD;  Location: [facility];  Service: Plastics;  Laterality: Right;   MASTECTOMY Right 2015   MASTECTOMY W/ SENTINEL NODE BIOPSY Right 12/13/2013   Procedure: RIGHT TOTAL MASTECTOMY WITH SENTINEL LYMPH NODE BIOPSY;  Surgeon: Ernestene Mention, MD;  Location: [facility];  Service: General;  Laterality: Right;   MASTOPEXY Left 09/26/2014   Procedure: MASTOPEXY;  Surgeon: Glenna Fellows, MD;  Location: [facility];  Service: Plastics;  Laterality: Left;   PORT-A-CATH REMOVAL Right 03/06/2015   Procedure: REMOVAL PORT-A-CATH;  Surgeon: Glenna Fellows, MD;  Location: [facility];  Service: Plastics;  Laterality: Right;   PORTACATH PLACEMENT Right 12/13/2013   Procedure: INSERTION PORT-A-CATH;  Surgeon: Ernestene Mention, MD;  Location: [facility];  Service: General;  Laterality: Right;   REDUCTION MAMMAPLASTY Left 2015   REMOVAL OF BILATERAL TISSUE EXPANDERS WITH PLACEMENT OF BILATERAL BREAST IMPLANTS Bilateral 09/26/2014   Procedure: REMOVAL OF RIGHT TISSUE EXPANDER WITH PLACEMENT OF RIGHT BREAST IMPLANT LIPOFILLING TO RIGHT BREAST/LEFT BREAST MASTOPEXY FOR SYMMETRY;  Surgeon: Glenna Fellows, MD;  Location: [facility];  Service: Plastics;  Laterality: Bilateral;   TISSUE EXPANDER PLACEMENT Right 01/10/2014   Procedure: Incision and Drainage Right Breast Seroma with TISSUE EXPANDER exchange;  Surgeon: Glenna Fellows, MD;  Location: [facility];  Service: Plastics;  Laterality: Right;   VAGINAL HYSTERECTOMY  1980   no salpingo-oophoretomu   Patient Active Problem List   Diagnosis Date Noted   Essential (primary) hypertension 09/17/2023   Osteopenia of lumbar spine 09/15/2023   Elevated blood pressure reading without diagnosis of hypertension 09/15/2023   Mixed hyperlipidemia 04/27/2023   Chronic gout of foot 02/11/2023   Chronic kidney disease, stage 3b (HCC)  02/11/2023   Borderline diabetes 01/25/2023   Osteopenia of both hips 02/27/2015   Personal history of malignant neoplasm of breast 09/26/2014   Rheumatoid arthritis (HCC) 06/27/2014   Chemotherapy induced cardiomyopathy (HCC) 05/11/2014   Chemotherapy-induced neuropathy (HCC) 03/14/2014    PCP: Dulce Sellar, NP  REFERRING PROVIDER: Rodolph Bong, MD  REFERRING DIAG: .M54.42,M54.41,G89.29 (ICD-10-CM) - Chronic bilateral low back pain with bilateral sciatica  Rationale for Evaluation and Treatment: Rehabilitation  THERAPY DIAG:  Other low back pain  Chronic pain of both shoulders  Muscle weakness (generalized)  ONSET DATE: Nov 2023  SUBJECTIVE:  SUBJECTIVE STATEMENT: 11/16/2023 Feeling a little better, but continues to report stiffness.    Eval:States that she has RA and she has had hip and back pain intermittently. States that there are days when she can hardly move. States that she has had neuropathy numbness in her hands and feet and this is going on today.   Works part time 3hrs a day with a client for [employer].    States the pain in November got worse. Some days are better than others. States heat takes the edge off. Ice hurts. Currently taking lyrical, tylenol, and was on prednisone.  Reports her shoulders have been hurting for  her off on and has difficutly washing hair.  Left arm is worse than the right. Patient is Right handed   PERTINENT HISTORY:  anxiety, fibromyalgia, HTN, RA, Hx R breast cancer 2015  PAIN:  Are you having pain? Yes: NPRS scale: 5/10 Pain location: b hip and shoulders Pain description: stiff  Aggravating factors: sitting for long periods of time Relieving factors: heat  PRECAUTIONS: None  RED FLAGS: None   WEIGHT BEARING RESTRICTIONS:  No  FALLS:  Has patient fallen in last 6 months? No     OCCUPATION: aide - part time  PLOF: Independent  PATIENT GOALS: to have less pain  NEXT MD VISIT: next week  OBJECTIVE:  Note: Objective measures were completed at Evaluation unless otherwise noted.  DIAGNOSTIC FINDINGS:  09/21/23 B hip xray FINDINGS: There is no evidence of hip fracture or dislocation. There is no evidence of arthropathy or other focal bone abnormality.   IMPRESSION: Negative.  PATIENT SURVEYS:  FOTO 24%  COGNITION: Overall cognitive status: Within functional limits for tasks assessed     SENSATION: Not tested    POSTURE:  grips glutes and standing, hips externally rotated and standing knees hyperextended with excessive lumbar lordosis noted  PALPATION: Tenderness to palpation along bilateral hips  LUMBAR ROM:   AROM eval  Flexion 50% limited  Extension 75% limited *  Right lateral flexion 75% limited *  Left lateral flexion 50% limited   Right rotation   Left rotation    (Blank rows = not tested)    UE Measurements Upper Extremity left EVAL Right  EVAL   A/PROM MMT A/PROM MMT  Shoulder Flexion /160*  /160*   Shoulder Extension      Shoulder Abduction /170*  /170*   Shoulder Adduction      Shoulder Internal Rotation /70*  /45*   Shoulder External Rotation /70*  /60*   Elbow Flexion      Elbow Extension      Wrist Flexion      Wrist Extension      Wrist Supination      Wrist Pronation      Wrist Ulnar Deviation      Wrist Radial Deviation      Grip Strength NA  NA     (Blank rows = not tested)   * pain   LE Measurements Lower Extremity Right EVAL Left EVAL   A/PROM MMT A/PROM MMT  Hip Flexion WFL*  WFL*   Hip Extension      Hip Abduction      Hip Adduction      Hip Internal rotation (tested supine) /15*  /10*   Hip External rotation (tested supine) /35*  /30*   Knee Flexion      Knee Extension      Ankle Dorsiflexion      Ankle Plantarflexion  Ankle  Inversion      Ankle Eversion       (Blank rows = not tested) * pain   LUMBAR SPECIAL TESTS:  L slump + , - SLR B  FUNCTIONAL TESTS:  Sit to stand: Bilateral use of upper extremities, pain, slow transitional movement  GAIT: Distance walked: 25 feet within clinic Assistive device utilized: None Level of assistance: Modified independence Comments: Wide base of support, Limited hip mobility, slow labored movements  TREATMENT DATE: 11/16/2023 Therapeutic Exercise:  Review of HEP and answering all questions Supine:     quad:   Seated:  shoulder flexion on pulleys 3 minutes, shoulder scaption on pulleys 3 minutes, horizontal shoulder abd 3x10 red band, lumbar flexion with ball 3 minutes   Standing: shoulder flexion up wall with shoulder ER activation 2 minutes, wall pushups 2x10, shoulder extension red band 3x10 B 2" holds, pallof press RTB 4x5 B, UBE 1,5 minutes forward and 1.5 minutes backwards    Neuromuscular Re-education: Manual Therapy:   Therapeutic Activity: Self Care: Trigger Point Dry Needling:  Modalities:     PATIENT EDUCATION:  Education details: on HEP Person educated: Patient Education method: Explanation, Facilities manager, and Handouts Education comprehension: verbalized understanding   HOME EXERCISE PROGRAM: [id number]  ASSESSMENT:  CLINICAL IMPRESSION: 11/16/2023 Able to progress exercises as tolerated. Added upright band exercises to HEP. Fatigue noted bur no increased in symptoms. Answered all questions and printed off new exercises to HEP. Will continue with current POC as tolerated.   Eval: Patient presents to physical therapy with complaints of low back pain, bilateral hip and leg pain as well as bilateral shoulder pain.  Symptoms have no mechanism of injury patient has been mobilizing with braces and using heat intermittently.  Pain has gradually increased over the last few weeks and was recommended to come here to help with her pain.  Patient has  difficulties washing her hair, transitional movements in and out of chairs and walking around when her pain is really bad.  Patient presents with weakness, range of motion deficits and muscle guarding that is likely contributing to current presentation.  It is noted that upon end of session patient had improved transitional mobility and reported significantly less pain.  Patient would greatly benefit from skilled PT to improve overall function and quality of life.   OBJECTIVE IMPAIRMENTS: Abnormal gait, decreased activity tolerance, decreased balance, decreased mobility, difficulty walking, decreased ROM, decreased strength, impaired UE functional use, improper body mechanics, prosthetic dependency , and pain.   ACTIVITY LIMITATIONS: carrying, lifting, sitting, standing, squatting, stairs, transfers, bed mobility, dressing, reach over head, hygiene/grooming, locomotion level, and caring for others  PARTICIPATION LIMITATIONS: meal prep, cleaning, community activity, and occupation  PERSONAL FACTORS: Age, Fitness, and 3+ comorbidities: Bilateral neuropathy in hands and feet, RA, chronic knee pain  are also affecting patient's functional outcome.   REHAB POTENTIAL: Good  CLINICAL DECISION MAKING: Stable/uncomplicated  EVALUATION COMPLEXITY: Low   GOALS: Goals reviewed with patient? yes  SHORT TERM GOALS: Target date: 11/30/2023  Patient will be independent in self management strategies to improve quality of life and functional outcomes. Baseline: New Program Goal status: INITIAL  2.  Patient will report at least 50% improvement in overall symptoms and/or function to demonstrate improved functional mobility Baseline: 0% better Goal status: INITIAL  3.  Patient will be able to transition from sit to stand without the use of her hands and without pain to improve transitional movements Baseline: Unable Goal status: INITIAL  LONG TERM GOALS: Target date: 01/11/2024   Patient will  report at least 75% improvement in overall symptoms and/or function to demonstrate improved functional mobility Baseline: 0% better Goal status: INITIAL  2.  Patient will improve score on FOTO outcomes measure to projected score to demonstrate overall improved function and QOL Baseline: see above Goal status: INITIAL  3.  Patient will demonstrate pain-free shoulder range of motion in both shoulders in all directions Baseline: Painful Goal status: INITIAL  4.  Patient will demonstrate pain-free hip range of motion in all directions in both hips Baseline: Painful Goal status: INITIAL   PLAN:  PT FREQUENCY: 1-2x/week for total of 16 visits over 12 weeks certification period  PT DURATION: 12 weeks  PLANNED INTERVENTIONS: 97110-Therapeutic exercises, 97530- Therapeutic activity, 97112- Neuromuscular re-education, 97535- Self Care, 78295- Manual therapy, [phone]- Gait training, [phone]- Orthotic Fit/training, [phone]- Canalith repositioning, [id number]- Aquatic Therapy, 97014- Electrical stimulation (unattended), [phone]- Ionotophoresis 4mg /ml Dexamethasone, Patient/Family education, Balance training, Stair training, Taping, Dry Needling, Joint mobilization, Joint manipulation, Spinal manipulation, Spinal mobilization, Cryotherapy, and Moist heat   PLAN FOR NEXT SESSION: Assess balance, pain management strategies with heat and vibration, gentle range of motion exercises, strengthening once able  1:44 PM, 11/16/23 Tereasa Coop, DPT Physical Therapy with [facility]

## 2023-11-18 ENCOUNTER — Ambulatory Visit: Payer: Medicare Other | Admitting: Family

## 2023-11-19 NOTE — Telephone Encounter (Signed)
 Copied from CRM [id number]. Topic: Clinical - Prescription Issue >> Nov 19, 2023  1:54 PM Melissa C wrote: Reason for CRM: copied from earlier CRM: patient is wanting to see if [facility] would like her to continue taking the TRULICITY  1.5 MG/0.5ML SOAJ and febuxostat  (ULORIC ) 40 MG tablet prescriptions. She is out of these medications and will need refills sent to [facility] - [place], [place] - [address] if she is to continue these scripts.  Also note: patient stated that pharmacy wanted her to pay $300 foTRULICITY  1.5 MG/0.5ML SOAJ and she didn't have to pay that before. She needs an alternative. Please advise with patient.

## 2023-11-19 NOTE — Telephone Encounter (Signed)
 Copied from CRM [id number]. Topic: Clinical - Medication Question >> Nov 19, 2023  1:41 PM Corin V wrote: Reason for CRM: Patient is wanting to see if [facility] would like her to continue taking the TRULICITY  1.5 MG/0.5ML SOAJ and febuxostat  (ULORIC ) 40 MG tablet prescriptions. She is out of these medications and will need refills sent to [facility] - [place], [place] - [address] if she is to continue these scripts.      Please start PA for pt Trulicity .

## 2023-11-20 ENCOUNTER — Encounter: Payer: Self-pay | Admitting: Oncology

## 2023-11-20 ENCOUNTER — Other Ambulatory Visit (HOSPITAL_COMMUNITY): Payer: Self-pay

## 2023-11-20 ENCOUNTER — Telehealth: Payer: Self-pay

## 2023-11-20 NOTE — Telephone Encounter (Signed)
 Pharmacy Patient Advocate Encounter   Received notification from RX Request Messages that prior authorization for Trulicity  1.5MG /0.5ML auto-injectors is required/requested.   Insurance verification completed.   The patient is insured through [facility] .   Per test claim: PA required; PA submitted to above mentioned insurance via CoverMyMeds Key/confirmation #/EOC [facility] Status is pending

## 2023-11-22 MED ORDER — TRULICITY 1.5 MG/0.5ML ~~LOC~~ SOAJ: 1.5000 mg | SUBCUTANEOUS | 1 refills | Status: DC

## 2023-11-22 NOTE — Telephone Encounter (Signed)
 Uloric  was sent for 6 mos - 90 pills w/1 refill - so should have refills - sent in Trulicity  refill & Pregabalin  should also have 1 more refill

## 2023-11-22 NOTE — Addendum Note (Signed)
 Addended by: Mariame Rybolt on: 11/22/2023 05:32 PM   Modules accepted: Orders

## 2023-11-23 ENCOUNTER — Encounter: Payer: Medicare Other | Admitting: Physical Therapy

## 2023-11-23 NOTE — Telephone Encounter (Signed)
 Pharmacy Patient Advocate Encounter  Received notification from [employer] that Prior Authorization for Trulicity  1.5MG /0.5ML auto-injectors  has been DENIED.  See denial reason below. No denial letter attached in CMM. Will attach denial letter to Media tab once received.   PA #/Case ID/Reference #: [id number]   DENIAL REASON: Patient does not have type 2 diabetes

## 2023-11-24 ENCOUNTER — Other Ambulatory Visit: Payer: Self-pay | Admitting: Family

## 2023-11-24 NOTE — Telephone Encounter (Signed)
please let pt know her Trulicity is no longer covered, her A1C has to be 6.5 or greater. Unfortunately we are seeing more of this with the new year that insurance is no longer covering these meds.

## 2023-11-25 NOTE — Telephone Encounter (Signed)
MyChart message sent to pt in regards to denial of Trulicity.

## 2023-11-30 ENCOUNTER — Telehealth: Payer: Self-pay | Admitting: Physical Therapy

## 2023-11-30 ENCOUNTER — Encounter: Payer: Medicare Other | Admitting: Physical Therapy

## 2023-11-30 NOTE — Telephone Encounter (Signed)
 Called and spoke with patient about missed apts. Patient in a lot of pain and got her days mixed up. Confirmed f/u apt with pt.   1:17 PM, 11/30/23 Tereasa Coop, DPT Physical Therapy with [facility]

## 2023-12-03 ENCOUNTER — Telehealth: Payer: Self-pay

## 2023-12-03 ENCOUNTER — Other Ambulatory Visit: Payer: Self-pay | Admitting: Family

## 2023-12-03 DIAGNOSIS — T451X5A Adverse effect of antineoplastic and immunosuppressive drugs, initial encounter: Secondary | ICD-10-CM

## 2023-12-03 DIAGNOSIS — G620 Drug-induced polyneuropathy: Secondary | ICD-10-CM

## 2023-12-03 NOTE — Telephone Encounter (Signed)
 Copied from CRM [id number]. Topic: Clinical - Medication Refill >> Dec 03, 2023  9:57 AM Sonny Dandy B wrote: Most Recent Primary Care Visit:  Provider: Marzella Schlein  Department: [facility]  Visit Type: ANNUAL WELL VISIT, INITIAL  Date: 11/10/2023  Medication: pregabalin (LYRICA) 50 MG capsule  Has the patient contacted their pharmacy? Yes (Agent: If no, request that the patient contact the pharmacy for the refill. If patient does not wish to contact the pharmacy document the reason why and proceed with request.) (Agent: If yes, when and what did the pharmacy advise?)  Is this the correct pharmacy for this prescription? Yes If no, delete pharmacy and type the correct one.  This is the patient's preferred pharmacy:  [facility] - [place], [place] - [address] [place] [address] Phone: [phone] Fax: [phone]   Has the prescription been filled recently? Yes  Is the patient out of the medication? Yes  Has the patient been seen for an appointment in the last year OR does the patient have an upcoming appointment? Yes  Can we respond through MyChart? Yes  Agent: Please be advised that Rx refills may take up to 3 business days. We ask that you follow-up with your pharmacy.

## 2023-12-03 NOTE — Telephone Encounter (Signed)
 please let Shanita know I need to check her kidney function before refilling this! sent msg thru pharmacy, but may not have gotten

## 2023-12-03 NOTE — Telephone Encounter (Signed)
 Copied from CRM [id number]. Topic: Clinical - Prescription Issue >> Dec 03, 2023  9:54 AM Sonny Dandy B wrote: Reason for CRM: Pt called to speak with provider. Pt states the prescription is wrong for her pregabalin (LYRICA) 50 MG capsule. Pt states the prescription needs to be changed to 3 times a day. Please call pt back [phone]   Please advise

## 2023-12-03 NOTE — Telephone Encounter (Signed)
 pt has office visit tomorrow to check labs prior to refilling medication.

## 2023-12-04 ENCOUNTER — Encounter: Payer: Self-pay | Admitting: Family

## 2023-12-04 ENCOUNTER — Ambulatory Visit (INDEPENDENT_AMBULATORY_CARE_PROVIDER_SITE_OTHER): Payer: Medicare Other | Admitting: Family

## 2023-12-04 VITALS — BP 172/101 | HR 71 | Temp 97.9°F | Ht 59.0 in | Wt 163.2 lb

## 2023-12-04 DIAGNOSIS — E782 Mixed hyperlipidemia: Secondary | ICD-10-CM

## 2023-12-04 DIAGNOSIS — T451X5A Adverse effect of antineoplastic and immunosuppressive drugs, initial encounter: Secondary | ICD-10-CM

## 2023-12-04 DIAGNOSIS — N1832 Chronic kidney disease, stage 3b: Secondary | ICD-10-CM | POA: Diagnosis not present

## 2023-12-04 DIAGNOSIS — G620 Drug-induced polyneuropathy: Secondary | ICD-10-CM

## 2023-12-04 DIAGNOSIS — I10 Essential (primary) hypertension: Secondary | ICD-10-CM | POA: Diagnosis not present

## 2023-12-04 DIAGNOSIS — M1A0790 Idiopathic chronic gout, unspecified ankle and foot, without tophus (tophi): Secondary | ICD-10-CM

## 2023-12-04 MED ORDER — FEBUXOSTAT 40 MG PO TABS: 40.0000 mg | ORAL_TABLET | Freq: Every day | ORAL | Status: DC

## 2023-12-04 MED ORDER — ATORVASTATIN CALCIUM 20 MG PO TABS
20.0000 mg | ORAL_TABLET | Freq: Every day | ORAL | 1 refills | Status: DC
Start: 2023-12-04 — End: 2024-08-26

## 2023-12-04 MED ORDER — LOSARTAN POTASSIUM 25 MG PO TABS: 25.0000 mg | ORAL_TABLET | Freq: Two times a day (BID) | ORAL | 1 refills | Status: DC

## 2023-12-04 NOTE — Progress Notes (Signed)
 Patient ID: Angela Gross, female    DOB: 1951/01/13, 73 y.o.   MRN: [id number]  Chief Complaint  Patient presents with   Chronic Kidney Disease   Peripheral Neuropathy   Hypertension   Hyperlipidemia   Gout       CKD:  stage 3B, last creatinine 1.26,  GFR 43.32 09/2023. Switched Allopurionol to [employer]. Stopped Gabapentin & switched to Lyrica, pt here to recheck labs before refilling Lyrica.  Pt avoiding NSAIDs, could drink more water.  Gout:  hx of Allopurinol qd, switched to Uloric d/t CKD. Takes Colchicine for flare ups. Denies any flare ups since on Uloric.   Hyperlipidemia: Patient is currently maintained on the following medication for hyperlipidemia: Lipitor 20mg  qd. Patient denies myalgia or other side effects. Patient reports good compliance with low fat/low cholesterol diet.  Last lipid panel as follows: Lab Results  Component Value Date   CHOL 175 02/03/2023   HDL 35 (L) 02/03/2023   LDLCALC 96 02/03/2023   TRIG 329 (H) 02/03/2023   CHOLHDL 5.0 (H) 02/03/2023   Hypertension: Patient is currently maintained on the following medications for blood pressure: Losartan 25mg  qd. Patient reports good compliance with blood pressure medications. Patient denies chest pain, headaches, shortness of breath or swelling. 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 Previous use of gabapentin with mixed results. Concerns about potential kidney impact. -Started Lyrica (pregabalin), 50mg  tid last visit, states it has helped, denies dizziness or drowsiness, ran out 2 days ago and can feel her arms hurting more -rechecking CMP today, if ok will send refill -F/U in 3 mos.   Chronic gout of foot, unspecified cause, unspecified laterality Assessment & Plan: chronic, stable switched to Uloric 40mg  qd last visit d/t CKD w/Allopurinol, denies SE or any gout flares refill sent continue to advise on foods to avoid, eat yogurt or drink a little cherry juice as soon as sx start  rechecking kidney fx today f/u 3 mos  Orders: -     Febuxostat; Take 1 tablet (40 mg total) by mouth daily.  Mixed hyperlipidemia Assessment & Plan: chronic taking Lipitor 20mg  every day, sending refill  checking CMP today Fish Oil 1G bid f/u 3 mos, fasting labs  Orders: -     Atorvastatin Calcium; Take 1 tablet (20 mg total) by mouth daily.  Dispense: 90 tablet; Refill: 1  Essential (primary) hypertension Assessment & Plan: chronic, unstable today pt reports having increase in pain since running out of Lyrica, also sees Rheum for chronic pain pt denies sx, occasional HA pt advised on increasing Losartan bid sending refill f/u in 3 mos  Orders: -     Losartan Potassium; Take 1 tablet (25 mg total) by mouth in the morning and at bedtime.  Dispense: 180 tablet; Refill: 1   Subjective:    Outpatient Medications Prior to Visit  Medication Sig Dispense Refill   ACCU-CHEK  GUIDE test strip USE TO TEST BLOOD SUGARS ONCE WEEKLY     B Complex-C (B-COMPLEX WITH VITAMIN C) tablet Take 1 tablet by mouth daily.     colchicine 0.6 MG tablet Take 1 tablet (0.6 mg total) by mouth daily as needed (For Gout pain. Take for 5 days only.). 30 tablet 0   fluticasone (FLONASE) 50 MCG/ACT nasal spray Place into both nostrils.     furosemide (LASIX) 20 MG tablet Take 20 mg by  mouth daily as needed for fluid.     hydroxychloroquine (PLAQUENIL) 200 MG tablet 1 tab Orally twice a day for 30 days     Olopatadine HCl 0.2 % SOLN Apply 1 drop to eye daily. 2.5 mL 3   potassium chloride SA (KLOR-CON M) 20 MEQ tablet Take 1 tablet (20 mEq total) by mouth as directed. Take each day you take a Lasix pill.     pregabalin (LYRICA) 50 MG capsule Take 1 capsule (50 mg total) by mouth 3 (three) times daily. 60 capsule 2   Upadacitinib ER (RINVOQ) 15 MG TB24 Take by mouth.     Vitamin D, Cholecalciferol, 25 MCG (1000 UT) CAPS Take 25 mcg by mouth daily.     atorvastatin (LIPITOR) 20 MG tablet Take 1 tablet (20 mg total) by mouth daily.     febuxostat (ULORIC) 40 MG tablet TAKE 1 TABLET (40 MG TOTAL) BY MOUTH DAILY. 90 tablet 1   losartan (COZAAR) 25 MG tablet Take 1 tablet (25 mg total) by mouth daily. OK to take an extra pill if you feel your blood pressure is high. 90 tablet 1   benzonatate (TESSALON PERLES) 100 MG capsule Take 1 capsule (100 mg total) by mouth 3 (three) times daily as needed for cough. (Patient not taking: Reported on 12/04/2023) 30 capsule 1   No facility-administered medications prior to visit.   Past Medical History:  Diagnosis Date   Anxiety    Breast cancer (HCC)    "right" (01/10/2014)   Chronic left shoulder pain    "work related injury" (01/10/2014)   Constipation 10/04/2014   Degenerative disc disease, cervical    Elevated blood pressure reading without diagnosis of hypertension 09/15/2023   Fibromyalgia    Full dentures    History of breast cancer in female 10/25/2013   Hot flashes, menopausal 04/04/2014   Hypertension    Osteoarthritis of both knees    Osteoporosis    Personal history of chemotherapy    PONV (postoperative nausea and vomiting)    Raynaud disease    Rheumatoid arthritis (HCC)    "elbows; fingers; toes"   Syncope and collapse    "4 times in the last year" (01/10/2014)   Wears glasses    Past Surgical History:  Procedure  Laterality Date   BREAST BIOPSY Right 10/2013   BREAST BIOPSY Left 07/22/2023   MM LT BREAST BX W LOC DEV 1ST LESION IMAGE BX SPEC STEREO GUIDE 07/22/2023 [facility]   BREAST BIOPSY Left 07/22/2023   MM LT BREAST BX W LOC DEV EA AD LESION IMG BX SPEC STEREO GUIDE 07/22/2023 [facility]   BREAST IMPLANT EXCHANGE Right 02/12/2016   Procedure: EXCHANGE SILICONE IMPLANT ON RIGHT, CAPSULORRAPHY AND PLACEMENT OF ALLODERM;  Surgeon: Glenna Fellows, MD;  Location: [facility];  Service: Plastics;  Laterality: Right;   BREAST RECONSTRUCTION Right 12/12/2014   Procedure: REVISION OF RIGHT RECONSTRUCTIVE BREAST WITH CAPSULORRHAPHY PLACEMENT OF SILCONE IMPLANTS AND LIPOFILLING TO RIGHT CHEST ;  Surgeon: Glenna Fellows,  MD;  Location: [facility];  Service: Plastics;  Laterality: Right;   BREAST RECONSTRUCTION Right 03/06/2015   Procedure: RIGHT NIPPLE AEROLA COMPLEX RECONSTRUCTION AND LOCAL FLAP WITH LIPO FILLING TO RIGHT BREAST,REMOVAL RIGHT CHEST PORT;  Surgeon: Glenna Fellows, MD;  Location: [facility];  Service: Plastics;  Laterality: Right;   BREAST RECONSTRUCTION Right 05/18/2018   Procedure: revision right breast reconstruction with silicone implant exchange;  Surgeon: Glenna Fellows, MD;  Location: [facility];  Service: Plastics;  Laterality: Right;   BREAST RECONSTRUCTION WITH PLACEMENT OF TISSUE EXPANDER AND FLEX HD (ACELLULAR HYDRATED DERMIS) Right 12/13/2013   Procedure: RIGHT BREAST RECONSTRUCTION WITH PLACEMENT OF TISSUE EXPANDER AND FLEX HD (ACELLULAR HYDRATED DERMIS);  Surgeon: Glenna Fellows, MD;  Location: [facility];  Service: Plastics;  Laterality: Right;   CHOLECYSTECTOMY  1970's   COLONOSCOPY     LIPOMA EXCISION Bilateral 12/13/2013   Procedure: EXCISION OF LEFT BREAST KELOID AND RIGHT CHEST KELOID;  Surgeon: Glenna Fellows, MD;  Location: [facility];  Service: Plastics;  Laterality:  Bilateral;   LIPOSUCTION WITH LIPOFILLING Right 09/26/2014   Procedure: LIPOSUCTION WITH LIPOFILLING;  Surgeon: Glenna Fellows, MD;  Location: [facility];  Service: Plastics;  Laterality: Right;   LIPOSUCTION WITH LIPOFILLING Right 12/12/2014   Procedure: LIPOSUCTION WITH LIPOFILLING;  Surgeon: Glenna Fellows, MD;  Location: [facility];  Service: Plastics;  Laterality: Right;   LIPOSUCTION WITH LIPOFILLING Right 03/06/2015   Procedure: ABDOMINAL LIPOSUCTION WITH LIPOFILLING TO RIGHT BREAST;  Surgeon: Glenna Fellows, MD;  Location: [facility];  Service: Plastics;  Laterality: Right;   MASTECTOMY Right 2015   MASTECTOMY W/ SENTINEL NODE BIOPSY Right 12/13/2013   Procedure: RIGHT TOTAL MASTECTOMY WITH SENTINEL LYMPH NODE BIOPSY;  Surgeon: Ernestene Mention, MD;  Location: [facility];  Service: General;  Laterality: Right;   MASTOPEXY Left 09/26/2014   Procedure: MASTOPEXY;  Surgeon: Glenna Fellows, MD;  Location: [facility];  Service: Plastics;  Laterality: Left;   PORT-A-CATH REMOVAL Right 03/06/2015   Procedure: REMOVAL PORT-A-CATH;  Surgeon: Glenna Fellows, MD;  Location: [facility];  Service: Plastics;  Laterality: Right;   PORTACATH PLACEMENT Right 12/13/2013   Procedure: INSERTION PORT-A-CATH;  Surgeon: Ernestene Mention, MD;  Location: [facility];  Service: General;  Laterality: Right;   REDUCTION MAMMAPLASTY Left 2015   REMOVAL OF BILATERAL TISSUE EXPANDERS WITH PLACEMENT OF BILATERAL BREAST IMPLANTS Bilateral 09/26/2014   Procedure: REMOVAL OF RIGHT TISSUE EXPANDER WITH PLACEMENT OF RIGHT BREAST IMPLANT LIPOFILLING TO RIGHT BREAST/LEFT BREAST MASTOPEXY FOR SYMMETRY;  Surgeon: Glenna Fellows, MD;  Location:  [facility];  Service: Plastics;  Laterality: Bilateral;   TISSUE EXPANDER PLACEMENT Right 01/10/2014   Procedure: Incision and Drainage Right Breast Seroma with TISSUE  EXPANDER exchange;  Surgeon: Glenna Fellows, MD;  Location: [facility];  Service: Plastics;  Laterality: Right;   VAGINAL HYSTERECTOMY  1980   no [id number]   Allergies  Allergen Reactions   Codeine Hives and Other (See Comments)    Altered mental status, numbness    Diazepam Other (See Comments)    Delusions, hallucinations       Objective:    Physical Exam Vitals and nursing note reviewed.  Constitutional:      Appearance: Normal appearance.  Cardiovascular:     Rate and Rhythm: Normal rate and regular rhythm.  Pulmonary:     Effort: Pulmonary effort is normal.  Breath sounds: Normal breath sounds.  Musculoskeletal:        General: Normal range of motion.  Skin:    General: Skin is warm and dry.  Neurological:     Mental Status: She is alert.  Psychiatric:        Mood and Affect: Mood normal.        Behavior: Behavior normal.    BP (!) 172/101   Pulse 71   Temp 97.9 F (36.6 C) (Temporal)   Ht 4\' 11"  (1.499 m)   Wt 163 lb 3.2 oz (74 kg)   SpO2 99%   BMI 32.96 kg/m  Wt Readings from Last 3 Encounters:  12/04/23 163 lb 3.2 oz (74 kg)  11/10/23 164 lb (74.4 kg)  11/02/23 164 lb (74.4 kg)      Dulce Sellar, NP

## 2023-12-04 NOTE — Assessment & Plan Note (Addendum)
 chronic taking Lipitor 20mg  every day, sending refill  checking CMP today Fish Oil 1G bid f/u 3 mos, fasting labs

## 2023-12-04 NOTE — Assessment & Plan Note (Signed)
 chronic, unstable History of chemotherapy-induced neuropathy, with symptoms predating chemotherapy. Previous use of gabapentin with mixed results. Concerns about potential kidney impact. -Started Lyrica (pregabalin), 50mg  tid last visit, states it has helped, denies dizziness or drowsiness, ran out 2 days ago and can feel her arms hurting more -rechecking CMP today, if ok will send refill -F/U in 3 mos.

## 2023-12-04 NOTE — Assessment & Plan Note (Signed)
 chronic, unstable today pt reports having increase in pain since running out of Lyrica, also sees Rheum for chronic pain pt denies sx, occasional HA pt advised on increasing Losartan bid sending refill f/u in 3 mos

## 2023-12-04 NOTE — Assessment & Plan Note (Signed)
 chronic, stable switched to Uloric 40mg  qd last visit d/t CKD w/Allopurinol, denies SE or any gout flares refill sent continue to advise on foods to avoid, eat yogurt or drink a little cherry juice as soon as sx start  rechecking kidney fx today f/u 3 mos

## 2023-12-04 NOTE — Assessment & Plan Note (Addendum)
 chronic - creatinine 1.26, GFR 43.32 09/2023 meds to monitor: Uloric 40mg , Lyrica 50mg  tid rechecking CMP today continue to advise to drink at least 2L water every day, avoid NSAIDs, Tylenol ok for pain/fever will continue to monitor

## 2023-12-05 LAB — COMPREHENSIVE METABOLIC PANEL
AG Ratio: 2 (calc) (ref 1.0–2.5)
ALT: 14 U/L (ref 6–29)
AST: 23 U/L (ref 10–35)
Albumin: 4.7 g/dL (ref 3.6–5.1)
Alkaline phosphatase (APISO): 55 U/L (ref 37–153)
BUN/Creatinine Ratio: 10 (calc) (ref 6–22)
BUN: 17 mg/dL (ref 7–25)
CO2: 26 mmol/L (ref 20–32)
Calcium: 10 mg/dL (ref 8.6–10.4)
Chloride: 106 mmol/L (ref 98–110)
Creat: 1.67 mg/dL — ABNORMAL HIGH (ref 0.60–1.00)
Globulin: 2.4 g/dL (ref 1.9–3.7)
Glucose, Bld: 92 mg/dL (ref 65–99)
Potassium: 4.5 mmol/L (ref 3.5–5.3)
Sodium: 141 mmol/L (ref 135–146)
Total Bilirubin: 0.4 mg/dL (ref 0.2–1.2)
Total Protein: 7.1 g/dL (ref 6.1–8.1)

## 2023-12-06 NOTE — Progress Notes (Signed)
 Her kidney function is down, but we can try the Lyrica just twice a day and recheck her labs in 2 months.  Remind her to drink at least 2 liters every day = 8 cups of 8 oz water.

## 2023-12-07 ENCOUNTER — Ambulatory Visit: Payer: Medicare Other | Admitting: Physical Therapy

## 2023-12-07 ENCOUNTER — Encounter: Payer: Self-pay | Admitting: Physical Therapy

## 2023-12-07 DIAGNOSIS — M25512 Pain in left shoulder: Secondary | ICD-10-CM | POA: Diagnosis not present

## 2023-12-07 DIAGNOSIS — M25511 Pain in right shoulder: Secondary | ICD-10-CM | POA: Diagnosis not present

## 2023-12-07 DIAGNOSIS — M6281 Muscle weakness (generalized): Secondary | ICD-10-CM

## 2023-12-07 DIAGNOSIS — M5459 Other low back pain: Secondary | ICD-10-CM | POA: Diagnosis not present

## 2023-12-07 DIAGNOSIS — G8929 Other chronic pain: Secondary | ICD-10-CM

## 2023-12-07 NOTE — Therapy (Signed)
 OUTPATIENT PHYSICAL THERAPY THORACOLUMBAR Treatment Progress Note Reporting Period 10/19/23 to 12/07/23  See note below for Objective Data and Assessment of Progress/Goals.      Patient Name: Angela Gross MRN: [id number] DOB:1951-07-08, 73 y.o., female Today's Date: 12/07/2023  END OF SESSION:  PT End of Session - 12/07/23 1311     Visit Number 7    Number of Visits 16    Date for PT Re-Evaluation 01/11/24    Authorization Type [employer] medicare - auth approved 10/19/2023 - 01/11/2024  Number of visits: 16 Visit(s)    Authorization - Visit Number 7    Authorization - Number of Visits 16    Progress Note Due on Visit 17    PT Start Time 1306    PT Stop Time 1344    PT Time Calculation (min) 38 min    Activity Tolerance Patient tolerated treatment well    Behavior During Therapy WFL for tasks assessed/performed             Past Medical History:  Diagnosis Date   Anxiety    Breast cancer (HCC)    "right" (01/10/2014)   Chronic left shoulder pain    "work related injury" (01/10/2014)   Constipation 10/04/2014   Degenerative disc disease, cervical    Elevated blood pressure reading without diagnosis of hypertension 09/15/2023   Fibromyalgia    Full dentures    History of breast cancer in female 10/25/2013   Hot flashes, menopausal 04/04/2014   Hypertension    Osteoarthritis of both knees    Osteoporosis    Personal history of chemotherapy    PONV (postoperative nausea and vomiting)    Raynaud disease    Rheumatoid arthritis (HCC)    "elbows; fingers; toes"   Syncope and collapse    "4 times in the last year" (01/10/2014)   Wears glasses    Past Surgical History:  Procedure Laterality Date   BREAST BIOPSY Right 10/2013   BREAST BIOPSY Left 07/22/2023   MM LT BREAST BX W LOC DEV 1ST LESION IMAGE BX SPEC STEREO GUIDE 07/22/2023 [facility]   BREAST BIOPSY Left 07/22/2023   MM LT BREAST BX W LOC DEV EA AD LESION IMG BX SPEC STEREO GUIDE 07/22/2023 [facility]  [facility]   BREAST IMPLANT EXCHANGE Right 02/12/2016   Procedure: EXCHANGE SILICONE IMPLANT ON RIGHT, CAPSULORRAPHY AND PLACEMENT OF ALLODERM;  Surgeon: Glenna Fellows, MD;  Location: [facility];  Service: Plastics;  Laterality: Right;   BREAST RECONSTRUCTION Right 12/12/2014   Procedure: REVISION OF RIGHT RECONSTRUCTIVE BREAST WITH CAPSULORRHAPHY PLACEMENT OF SILCONE IMPLANTS AND LIPOFILLING TO RIGHT CHEST ;  Surgeon: Glenna Fellows, MD;  Location: [facility];  Service: Plastics;  Laterality: Right;   BREAST RECONSTRUCTION Right 03/06/2015   Procedure: RIGHT NIPPLE AEROLA COMPLEX RECONSTRUCTION AND LOCAL FLAP WITH LIPO FILLING TO RIGHT BREAST,REMOVAL RIGHT CHEST PORT;  Surgeon: Glenna Fellows, MD;  Location: [facility];  Service: Plastics;  Laterality: Right;   BREAST RECONSTRUCTION Right 05/18/2018   Procedure: revision right breast reconstruction with silicone implant exchange;  Surgeon: Glenna Fellows, MD;  Location: [facility];  Service: Plastics;  Laterality: Right;   BREAST RECONSTRUCTION WITH PLACEMENT OF TISSUE EXPANDER AND FLEX HD (ACELLULAR HYDRATED DERMIS) Right 12/13/2013   Procedure: RIGHT BREAST RECONSTRUCTION WITH PLACEMENT OF TISSUE EXPANDER AND FLEX HD (ACELLULAR HYDRATED DERMIS);  Surgeon: Glenna Fellows, MD;  Location: [facility];  Service: Plastics;  Laterality: Right;   CHOLECYSTECTOMY  1970's  COLONOSCOPY     LIPOMA EXCISION Bilateral 12/13/2013   Procedure: EXCISION OF LEFT BREAST KELOID AND RIGHT CHEST KELOID;  Surgeon: Glenna Fellows, MD;  Location: [facility];  Service: Plastics;  Laterality: Bilateral;   LIPOSUCTION WITH LIPOFILLING Right 09/26/2014   Procedure: LIPOSUCTION WITH LIPOFILLING;  Surgeon: Glenna Fellows, MD;  Location: [facility];  Service: Plastics;  Laterality: Right;   LIPOSUCTION WITH LIPOFILLING Right 12/12/2014   Procedure: LIPOSUCTION WITH  LIPOFILLING;  Surgeon: Glenna Fellows, MD;  Location: [facility];  Service: Plastics;  Laterality: Right;   LIPOSUCTION WITH LIPOFILLING Right 03/06/2015   Procedure: ABDOMINAL LIPOSUCTION WITH LIPOFILLING TO RIGHT BREAST;  Surgeon: Glenna Fellows, MD;  Location: [facility];  Service: Plastics;  Laterality: Right;   MASTECTOMY Right 2015   MASTECTOMY W/ SENTINEL NODE BIOPSY Right 12/13/2013   Procedure: RIGHT TOTAL MASTECTOMY WITH SENTINEL LYMPH NODE BIOPSY;  Surgeon: Ernestene Mention, MD;  Location: [facility];  Service: General;  Laterality: Right;   MASTOPEXY Left 09/26/2014   Procedure: MASTOPEXY;  Surgeon: Glenna Fellows, MD;  Location: [facility];  Service: Plastics;  Laterality: Left;   PORT-A-CATH REMOVAL Right 03/06/2015   Procedure: REMOVAL PORT-A-CATH;  Surgeon: Glenna Fellows, MD;  Location: [facility];  Service: Plastics;  Laterality: Right;   PORTACATH PLACEMENT Right 12/13/2013   Procedure: INSERTION PORT-A-CATH;  Surgeon: Ernestene Mention, MD;  Location: [facility];  Service: General;  Laterality: Right;   REDUCTION MAMMAPLASTY Left 2015   REMOVAL OF BILATERAL TISSUE EXPANDERS WITH PLACEMENT OF BILATERAL BREAST IMPLANTS Bilateral 09/26/2014   Procedure: REMOVAL OF RIGHT TISSUE EXPANDER WITH PLACEMENT OF RIGHT BREAST IMPLANT LIPOFILLING TO RIGHT BREAST/LEFT BREAST MASTOPEXY FOR SYMMETRY;  Surgeon: Glenna Fellows, MD;  Location: [facility];  Service: Plastics;  Laterality: Bilateral;   TISSUE EXPANDER PLACEMENT Right 01/10/2014   Procedure: Incision and Drainage Right Breast Seroma with TISSUE EXPANDER exchange;  Surgeon: Glenna Fellows, MD;  Location: [facility];  Service: Plastics;  Laterality: Right;   VAGINAL HYSTERECTOMY  1980   no salpingo-oophoretomu   Patient Active Problem List   Diagnosis Date Noted   Essential (primary) hypertension 09/17/2023   Osteopenia of lumbar  spine 09/15/2023   Mixed hyperlipidemia 04/27/2023   Chronic gout of foot 02/11/2023   Chronic kidney disease, stage 3b (HCC) 02/11/2023   Borderline diabetes 01/25/2023   Osteopenia of both hips 02/27/2015   Personal history of malignant neoplasm of breast 09/26/2014   Rheumatoid arthritis (HCC) 06/27/2014   Chemotherapy induced cardiomyopathy (HCC) 05/11/2014   Chemotherapy-induced neuropathy (HCC) 03/14/2014    PCP: Dulce Sellar, NP  REFERRING PROVIDER: Rodolph Bong, MD  REFERRING DIAG: .M54.42,M54.41,G89.29 (ICD-10-CM) - Chronic bilateral low back pain with bilateral sciatica  Rationale for Evaluation and Treatment: Rehabilitation  THERAPY DIAG:  Other low back pain  Chronic pain of both shoulders  Muscle weakness (generalized)  ONSET DATE: Nov 2023  SUBJECTIVE:  SUBJECTIVE STATEMENT: 12/07/2023 Feeling more stiff today. Been getting worse. Stiffness is lasting longer and sometimes cannot do the exercises due to stiffness. Sometimes she feels 80% better and other times like today she feels no better.   Eval:States that she has RA and she has had hip and back pain intermittently. States that there are days when she can hardly move. States that she has had neuropathy numbness in her hands and feet and this is going on today.   Works part time 3hrs a day with a client for [employer].    States the pain in November got worse. Some days are better than others. States heat takes the edge off. Ice hurts. Currently taking lyrical, tylenol, and was on prednisone.  Reports her shoulders have been hurting for  her off on and has difficutly washing hair.  Left arm is worse than the right. Patient is Right handed   PERTINENT HISTORY:  anxiety, fibromyalgia, HTN, RA, Hx R breast cancer  2015  PAIN:  Are you having pain? Yes: NPRS scale: 9/10 Pain location: b hip and shoulders Pain description: stiff  Aggravating factors: sitting for long periods of time Relieving factors: heat  PRECAUTIONS: None  RED FLAGS: None   WEIGHT BEARING RESTRICTIONS: No  FALLS:  Has patient fallen in last 6 months? No     OCCUPATION: aide - part time  PLOF: Independent  PATIENT GOALS: to have less pain     OBJECTIVE:  Note: Objective measures were completed at Evaluation unless otherwise noted.  DIAGNOSTIC FINDINGS:  09/21/23 B hip xray FINDINGS: There is no evidence of hip fracture or dislocation. There is no evidence of arthropathy or other focal bone abnormality.   IMPRESSION: Negative.  PATIENT SURVEYS:  FOTO 24% , 12/07/23 35% with predicted 46%  COGNITION: Overall cognitive status: Within functional limits for tasks assessed     SENSATION: Not tested    POSTURE:  grips glutes and standing, hips externally rotated and standing knees hyperextended with excessive lumbar lordosis noted  PALPATION: Tenderness to palpation along bilateral hips  LUMBAR ROM:   AROM eval  Flexion 50% limited  Extension 75% limited *  Right lateral flexion 75% limited *  Left lateral flexion 50% limited   Right rotation   Left rotation    (Blank rows = not tested)    UE Measurements Upper Extremity left 12/07/23 Right  12/07/23   A/PROM MMT A/PROM MMT  Shoulder Flexion 160*/110*  /160*   Shoulder Extension      Shoulder Abduction /170*  /170*   Shoulder Adduction      Shoulder Internal Rotation /60*  /45*   Shoulder External Rotation /70*  /60*   Elbow Flexion      Elbow Extension      Wrist Flexion      Wrist Extension      Wrist Supination      Wrist Pronation      Wrist Ulnar Deviation      Wrist Radial Deviation      Grip Strength NA  NA     (Blank rows = not tested)   * pain/ muscle guarding    LE Measurements Lower Extremity Right 12/07/23  Left 12/07/23   A/PROM MMT A/PROM MMT  Hip Flexion 90*  90*   Hip Extension      Hip Abduction      Hip Adduction      Hip Internal rotation (tested supine) /10*  /20*   Hip External rotation (  tested supine) /30*  /30*   Knee Flexion      Knee Extension      Ankle Dorsiflexion      Ankle Plantarflexion      Ankle Inversion      Ankle Eversion       (Blank rows = not tested) * pain     FUNCTIONAL TESTS:  Sit to stand: Bilateral use of upper extremities, pain, slow transitional movement  GAIT: Distance walked: 25 feet within clinic Assistive device utilized: None Level of assistance: Modified independence Comments: Wide base of support, Limited hip mobility, slow labored movements  TREATMENT DATE: 12/07/2023 Therapeutic Exercise:  Review of HEP   Objective measurements updated and FOTO score Supine:  PROM of B shoulders, bent knee fall outs 2 minutes, hip IR 2 minutes, horizontal shoulder abd 2 minute B, shoulder ER 1 minute each arm, shoulder flexion with dowel 2 minutes    quad:   Seated:      Standing     Neuromuscular Re-education: Manual Therapy:  STM to B shoulders - tolerated well  Therapeutic Activity: Self Care: Trigger Point Dry Needling:  Modalities: thermotherapy to cervical spine and lumbar spine during supine interventions     PATIENT EDUCATION:  Education details: on HEP, on importance of gentle mobility to help with stiffness Person educated: Patient Education method: Explanation, Demonstration, and Handouts Education comprehension: verbalized understanding   HOME EXERCISE PROGRAM: [id number]  ASSESSMENT:  CLINICAL IMPRESSION: 12/07/2023 Patient with increased stiffness on this date. Educated patient on importance of continued mobility work to help with chronic joint stiffness and muscle spasms. Initial ROM significantly limited secondary to stiffness and muscle guarding, this reduced with heat and gentle exercises. Patient feeling a lot  better by end of session. Reviewed HEP and answered all questions. Encouraged MD f/u if pain continues to get worse.  Eval: Patient presents to physical therapy with complaints of low back pain, bilateral hip and leg pain as well as bilateral shoulder pain.  Symptoms have no mechanism of injury patient has been mobilizing with braces and using heat intermittently.  Pain has gradually increased over the last few weeks and was recommended to come here to help with her pain.  Patient has difficulties washing her hair, transitional movements in and out of chairs and walking around when her pain is really bad.  Patient presents with weakness, range of motion deficits and muscle guarding that is likely contributing to current presentation.  It is noted that upon end of session patient had improved transitional mobility and reported significantly less pain.  Patient would greatly benefit from skilled PT to improve overall function and quality of life.   OBJECTIVE IMPAIRMENTS: Abnormal gait, decreased activity tolerance, decreased balance, decreased mobility, difficulty walking, decreased ROM, decreased strength, impaired UE functional use, improper body mechanics, prosthetic dependency , and pain.   ACTIVITY LIMITATIONS: carrying, lifting, sitting, standing, squatting, stairs, transfers, bed mobility, dressing, reach over head, hygiene/grooming, locomotion level, and caring for others  PARTICIPATION LIMITATIONS: meal prep, cleaning, community activity, and occupation  PERSONAL FACTORS: Age, Fitness, and 3+ comorbidities: Bilateral neuropathy in hands and feet, RA, chronic knee pain  are also affecting patient's functional outcome.   REHAB POTENTIAL: Good  CLINICAL DECISION MAKING: Stable/uncomplicated  EVALUATION COMPLEXITY: Low   GOALS: Goals reviewed with patient? yes  SHORT TERM GOALS: Target date: 11/30/2023  Patient will be independent in self management strategies to improve quality of life and  functional outcomes. Baseline: New Program Goal status: PROGRESSING  2.  Patient will report at least 50% improvement in overall symptoms and/or function to demonstrate improved functional mobility Baseline: 0% better Goal status: PROGRESSING  3.  Patient will be able to transition from sit to stand without the use of her hands and without pain to improve transitional movements Baseline: Unable Goal status: PROGRESSING     LONG TERM GOALS: Target date: 01/11/2024   Patient will report at least 75% improvement in overall symptoms and/or function to demonstrate improved functional mobility Baseline: 0% better Goal status: PROGRESSING  2.  Patient will improve score on FOTO outcomes measure to projected score to demonstrate overall improved function and QOL Baseline: see above Goal status: PROGRESSING  3.  Patient will demonstrate pain-free shoulder range of motion in both shoulders in all directions Baseline: Painful Goal status: PROGRESSING  4.  Patient will demonstrate pain-free hip range of motion in all directions in both hips Baseline: Painful Goal status: PROGRESSING   PLAN:  PT FREQUENCY: 1-2x/week for total of 16 visits over 12 weeks certification period  PT DURATION: 12 weeks  PLANNED INTERVENTIONS: 97110-Therapeutic exercises, 97530- Therapeutic activity, 97112- Neuromuscular re-education, 97535- Self Care, 57846- Manual therapy, [phone]- Gait training, [phone]- Orthotic Fit/training, [phone]- Canalith repositioning, [id number]- Aquatic Therapy, 97014- Electrical stimulation (unattended), [phone]- Ionotophoresis 4mg /ml Dexamethasone, Patient/Family education, Balance training, Stair training, Taping, Dry Needling, Joint mobilization, Joint manipulation, Spinal manipulation, Spinal mobilization, Cryotherapy, and Moist heat   PLAN FOR NEXT SESSION: f/u with MD Assess balance, pain management strategies with heat and vibration, gentle range of motion exercises, strengthening once  able  1:47 PM, 12/07/23 Tereasa Coop, DPT Physical Therapy with [facility]

## 2023-12-09 DIAGNOSIS — R5383 Other fatigue: Secondary | ICD-10-CM | POA: Diagnosis not present

## 2023-12-09 DIAGNOSIS — E7800 Pure hypercholesterolemia, unspecified: Secondary | ICD-10-CM | POA: Diagnosis not present

## 2023-12-09 DIAGNOSIS — M0609 Rheumatoid arthritis without rheumatoid factor, multiple sites: Secondary | ICD-10-CM | POA: Diagnosis not present

## 2023-12-09 DIAGNOSIS — Z79899 Other long term (current) drug therapy: Secondary | ICD-10-CM | POA: Diagnosis not present

## 2023-12-09 DIAGNOSIS — M1991 Primary osteoarthritis, unspecified site: Secondary | ICD-10-CM | POA: Diagnosis not present

## 2023-12-09 DIAGNOSIS — M797 Fibromyalgia: Secondary | ICD-10-CM | POA: Diagnosis not present

## 2023-12-09 DIAGNOSIS — G629 Polyneuropathy, unspecified: Secondary | ICD-10-CM | POA: Diagnosis not present

## 2023-12-11 ENCOUNTER — Other Ambulatory Visit: Payer: Self-pay | Admitting: Family

## 2023-12-11 ENCOUNTER — Other Ambulatory Visit: Payer: Self-pay | Admitting: *Deleted

## 2023-12-11 DIAGNOSIS — G620 Drug-induced polyneuropathy: Secondary | ICD-10-CM

## 2023-12-11 NOTE — Telephone Encounter (Signed)
 Please see note below, pt is requesting updated script sent to pharmacy per provider's recommendations.  Copied from CRM [id number]. Topic: Clinical - Prescription Issue >> Dec 11, 2023 11:23 AM Adele Barthel wrote: Reason for CRM:   Patient is calling in regards to her pregabalin (LYRICA) 50 MG capsule. She typically takes the medication 3 times a day; however, after recent labs she was contacted by provider's nurse on 12/03/2023, advising for her to take 2 times a day and that the new prescription would be called in.   She has spoken with pharmacy and they have not received new prescription.   [facility] - [place], [place] - [address] [place], [place] [address] Phone: [phone]  Fax: [phone]    CB# [phone]

## 2023-12-11 NOTE — Telephone Encounter (Signed)
 Patient need new Rx with new sig  Rx Lyrica BID per PCP  See results note

## 2023-12-13 LAB — LAB REPORT - SCANNED
EGFR: 41
HM Hepatitis Screen: NEGATIVE

## 2023-12-13 MED ORDER — PREGABALIN 50 MG PO CAPS: 50.0000 mg | ORAL_CAPSULE | Freq: Two times a day (BID) | ORAL | 2 refills | Status: DC

## 2023-12-13 NOTE — Addendum Note (Signed)
 Addended byDulce Sellar on: 12/13/2023 07:52 PM   Modules accepted: Orders

## 2023-12-21 ENCOUNTER — Ambulatory Visit: Payer: Medicare Other | Admitting: Physical Therapy

## 2023-12-21 ENCOUNTER — Encounter: Payer: Self-pay | Admitting: Physical Therapy

## 2023-12-21 DIAGNOSIS — M25512 Pain in left shoulder: Secondary | ICD-10-CM | POA: Diagnosis not present

## 2023-12-21 DIAGNOSIS — G8929 Other chronic pain: Secondary | ICD-10-CM | POA: Diagnosis not present

## 2023-12-21 DIAGNOSIS — M25511 Pain in right shoulder: Secondary | ICD-10-CM

## 2023-12-21 DIAGNOSIS — M6281 Muscle weakness (generalized): Secondary | ICD-10-CM | POA: Diagnosis not present

## 2023-12-21 DIAGNOSIS — M5459 Other low back pain: Secondary | ICD-10-CM

## 2023-12-21 NOTE — Therapy (Addendum)
 OUTPATIENT PHYSICAL THERAPY THORACOLUMBAR Treatment    PHYSICAL THERAPY DISCHARGE SUMMARY  Visits from Start of Care: 8  Current functional level related to goals / functional outcomes: Could not reassess due to unplanned discharged    Remaining deficits: Could not reassess due to unplanned discharged    Education / Equipment: Could not reassess due to unplanned discharged    Patient agrees to discharge. Patient goals were partially met. Patient is being discharged due to not returning since the last visit.  9:18 AM, 01/19/24 Tereasa Coop, DPT Physical Therapy with [facility]    Patient Name: Angela Gross MRN: [id number] DOB:05/21/1951, 73 y.o., female Today's Date: 12/21/2023  END OF SESSION:  PT End of Session - 12/21/23 1112     Visit Number 8    Number of Visits 16    Date for PT Re-Evaluation 01/11/24    Authorization Type [employer] medicare - auth approved 10/19/2023 - 01/11/2024  Number of visits: 16 Visit(s)    Authorization - Visit Number 8    Authorization - Number of Visits 16    Progress Note Due on Visit 17    PT Start Time 1107    PT Stop Time 1145    PT Time Calculation (min) 38 min    Activity Tolerance Patient tolerated treatment well    Behavior During Therapy WFL for tasks assessed/performed             Past Medical History:  Diagnosis Date   Anxiety    Breast cancer (HCC)    "right" (01/10/2014)   Chronic left shoulder pain    "work related injury" (01/10/2014)   Constipation 10/04/2014   Degenerative disc disease, cervical    Elevated blood pressure reading without diagnosis of hypertension 09/15/2023   Fibromyalgia    Full dentures    History of breast cancer in female 10/25/2013   Hot flashes, menopausal 04/04/2014   Hypertension    Osteoarthritis of both knees    Osteoporosis    Personal history of chemotherapy    PONV (postoperative nausea and vomiting)    Raynaud disease    Rheumatoid arthritis (HCC)    "elbows;  fingers; toes"   Syncope and collapse    "4 times in the last year" (01/10/2014)   Wears glasses    Past Surgical History:  Procedure Laterality Date   BREAST BIOPSY Right 10/2013   BREAST BIOPSY Left 07/22/2023   MM LT BREAST BX W LOC DEV 1ST LESION IMAGE BX SPEC STEREO GUIDE 07/22/2023 [facility]   BREAST BIOPSY Left 07/22/2023   MM LT BREAST BX W LOC DEV EA AD LESION IMG BX SPEC STEREO GUIDE 07/22/2023 [facility]   BREAST IMPLANT EXCHANGE Right 02/12/2016   Procedure: EXCHANGE SILICONE IMPLANT ON RIGHT, CAPSULORRAPHY AND PLACEMENT OF ALLODERM;  Surgeon: Glenna Fellows, MD;  Location: [facility];  Service: Plastics;  Laterality: Right;   BREAST RECONSTRUCTION Right 12/12/2014   Procedure: REVISION OF RIGHT RECONSTRUCTIVE BREAST WITH CAPSULORRHAPHY PLACEMENT OF SILCONE IMPLANTS AND LIPOFILLING TO RIGHT CHEST ;  Surgeon: Glenna Fellows, MD;  Location: [facility];  Service: Plastics;  Laterality: Right;   BREAST RECONSTRUCTION Right 03/06/2015   Procedure: RIGHT NIPPLE AEROLA COMPLEX RECONSTRUCTION AND LOCAL FLAP WITH LIPO FILLING TO RIGHT BREAST,REMOVAL RIGHT CHEST PORT;  Surgeon: Glenna Fellows, MD;  Location: [facility];  Service: Plastics;  Laterality: Right;   BREAST RECONSTRUCTION Right 05/18/2018   Procedure: revision right breast reconstruction with silicone implant exchange;  Surgeon:  Glenna Fellows, MD;  Location: [facility];  Service: Plastics;  Laterality: Right;   BREAST RECONSTRUCTION WITH PLACEMENT OF TISSUE EXPANDER AND FLEX HD (ACELLULAR HYDRATED DERMIS) Right 12/13/2013   Procedure: RIGHT BREAST RECONSTRUCTION WITH PLACEMENT OF TISSUE EXPANDER AND FLEX HD (ACELLULAR HYDRATED DERMIS);  Surgeon: Glenna Fellows, MD;  Location: [facility];  Service: Plastics;  Laterality: Right;   CHOLECYSTECTOMY  1970's   COLONOSCOPY     LIPOMA EXCISION Bilateral 12/13/2013   Procedure: EXCISION OF LEFT BREAST  KELOID AND RIGHT CHEST KELOID;  Surgeon: Glenna Fellows, MD;  Location: [facility];  Service: Plastics;  Laterality: Bilateral;   LIPOSUCTION WITH LIPOFILLING Right 09/26/2014   Procedure: LIPOSUCTION WITH LIPOFILLING;  Surgeon: Glenna Fellows, MD;  Location: [facility];  Service: Plastics;  Laterality: Right;   LIPOSUCTION WITH LIPOFILLING Right 12/12/2014   Procedure: LIPOSUCTION WITH LIPOFILLING;  Surgeon: Glenna Fellows, MD;  Location: [facility];  Service: Plastics;  Laterality: Right;   LIPOSUCTION WITH LIPOFILLING Right 03/06/2015   Procedure: ABDOMINAL LIPOSUCTION WITH LIPOFILLING TO RIGHT BREAST;  Surgeon: Glenna Fellows, MD;  Location: [facility];  Service: Plastics;  Laterality: Right;   MASTECTOMY Right 2015   MASTECTOMY W/ SENTINEL NODE BIOPSY Right 12/13/2013   Procedure: RIGHT TOTAL MASTECTOMY WITH SENTINEL LYMPH NODE BIOPSY;  Surgeon: Ernestene Mention, MD;  Location: [facility];  Service: General;  Laterality: Right;   MASTOPEXY Left 09/26/2014   Procedure: MASTOPEXY;  Surgeon: Glenna Fellows, MD;  Location: [facility];  Service: Plastics;  Laterality: Left;   PORT-A-CATH REMOVAL Right 03/06/2015   Procedure: REMOVAL PORT-A-CATH;  Surgeon: Glenna Fellows, MD;  Location: [facility];  Service: Plastics;  Laterality: Right;   PORTACATH PLACEMENT Right 12/13/2013   Procedure: INSERTION PORT-A-CATH;  Surgeon: Ernestene Mention, MD;  Location: [facility];  Service: General;  Laterality: Right;   REDUCTION MAMMAPLASTY Left 2015   REMOVAL OF BILATERAL TISSUE EXPANDERS WITH PLACEMENT OF BILATERAL BREAST IMPLANTS Bilateral 09/26/2014   Procedure: REMOVAL OF RIGHT TISSUE EXPANDER WITH PLACEMENT OF RIGHT BREAST IMPLANT LIPOFILLING TO RIGHT BREAST/LEFT BREAST MASTOPEXY FOR SYMMETRY;  Surgeon: Glenna Fellows, MD;  Location: [facility];  Service: Plastics;   Laterality: Bilateral;   TISSUE EXPANDER PLACEMENT Right 01/10/2014   Procedure: Incision and Drainage Right Breast Seroma with TISSUE EXPANDER exchange;  Surgeon: Glenna Fellows, MD;  Location: [facility];  Service: Plastics;  Laterality: Right;   VAGINAL HYSTERECTOMY  1980   no [id number]   Patient Active Problem List   Diagnosis Date Noted   Essential (primary) hypertension 09/17/2023   Osteopenia of lumbar spine 09/15/2023   Mixed hyperlipidemia 04/27/2023   Chronic gout of foot 02/11/2023   Chronic kidney disease, stage 3b (HCC) 02/11/2023   Borderline diabetes 01/25/2023   Osteopenia of both hips 02/27/2015   Personal history of malignant neoplasm of breast 09/26/2014   Rheumatoid arthritis (HCC) 06/27/2014   Chemotherapy induced cardiomyopathy (HCC) 05/11/2014   Chemotherapy-induced neuropathy (HCC) 03/14/2014    PCP: Dulce Sellar, NP  REFERRING PROVIDER: Rodolph Bong, MD  REFERRING DIAG: .M54.42,M54.41,G89.29 (ICD-10-CM) - Chronic bilateral low back pain with bilateral sciatica  Rationale for Evaluation and Treatment: Rehabilitation  THERAPY DIAG:  Other low back pain  Chronic pain of both shoulders  Muscle weakness (generalized)  ONSET DATE: Nov 2023  SUBJECTIVE:  SUBJECTIVE STATEMENT: 12/21/2023 Was feeling stiff this AM but used some heat and feels a little better. States she feels like her muscles do better with a little warm up in the morning.   Eval:States that she has RA and she has had hip and back pain intermittently. States that there are days when she can hardly move. States that she has had neuropathy numbness in her hands and feet and this is going on today.   Works part time 3hrs a day with a client for [employer].    States the pain in November  got worse. Some days are better than others. States heat takes the edge off. Ice hurts. Currently taking lyrical, tylenol, and was on prednisone.  Reports her shoulders have been hurting for  her off on and has difficutly washing hair.  Left arm is worse than the right. Patient is Right handed   PERTINENT HISTORY:  anxiety, fibromyalgia, HTN, RA, Hx R breast cancer 2015  PAIN:  Are you having pain? Yes: NPRS scale: 7-8/10 Pain location: b hip and shoulders Pain description: stiff  Aggravating factors: sitting for long periods of time Relieving factors: heat  PRECAUTIONS: None  RED FLAGS: None   WEIGHT BEARING RESTRICTIONS: No  FALLS:  Has patient fallen in last 6 months? No     OCCUPATION: aide - part time  PLOF: Independent  PATIENT GOALS: to have less pain     OBJECTIVE:  Note: Objective measures were completed at Evaluation unless otherwise noted.  DIAGNOSTIC FINDINGS:  09/21/23 B hip xray FINDINGS: There is no evidence of hip fracture or dislocation. There is no evidence of arthropathy or other focal bone abnormality.   IMPRESSION: Negative.  PATIENT SURVEYS:  FOTO 24% , 12/07/23 35% with predicted 46%  COGNITION: Overall cognitive status: Within functional limits for tasks assessed     SENSATION: Not tested    POSTURE:  grips glutes and standing, hips externally rotated and standing knees hyperextended with excessive lumbar lordosis noted  PALPATION: Tenderness to palpation along bilateral hips  LUMBAR ROM:   AROM eval  Flexion 50% limited  Extension 75% limited *  Right lateral flexion 75% limited *  Left lateral flexion 50% limited   Right rotation   Left rotation    (Blank rows = not tested)    UE Measurements Upper Extremity left 12/07/23 Right  12/07/23   A/PROM MMT A/PROM MMT  Shoulder Flexion 160*/110*  /160*   Shoulder Extension      Shoulder Abduction /170*  /170*   Shoulder Adduction      Shoulder Internal Rotation /60*   /45*   Shoulder External Rotation /70*  /60*   Elbow Flexion      Elbow Extension      Wrist Flexion      Wrist Extension      Wrist Supination      Wrist Pronation      Wrist Ulnar Deviation      Wrist Radial Deviation      Grip Strength NA  NA     (Blank rows = not tested)   * pain/ muscle guarding    LE Measurements Lower Extremity Right 12/07/23 Left 12/07/23   A/PROM MMT A/PROM MMT  Hip Flexion 90*  90*   Hip Extension      Hip Abduction      Hip Adduction      Hip Internal rotation (tested supine) /10*  /20*   Hip External rotation (tested supine) /30*  /  30*   Knee Flexion      Knee Extension      Ankle Dorsiflexion      Ankle Plantarflexion      Ankle Inversion      Ankle Eversion       (Blank rows = not tested) * pain     FUNCTIONAL TESTS:  Sit to stand: Bilateral use of upper extremities, pain, slow transitional movement  GAIT: Distance walked: 25 feet within clinic Assistive device utilized: None Level of assistance: Modified independence Comments: Wide base of support, Limited hip mobility, slow labored movements  TREATMENT DATE: 12/21/2023 Therapeutic Exercise:  Review of HEP    Supine:  on ball LTR 2 minutes, DKC 2 minutes, SAQs 3x5 5" holds B, piriformis stretch on ball 1.5 minutes each leg, SHOULDER extension into ball 2 minutes B, hip add ball 2 minutes, shoulder flexion with ball , shoulder horizontal add iso 2 minutes total 5" holds    quad:   Seated:   LUMBAR FLEXION with ball assistance 2 minutes, shoulder abd on ball 1 minute each arm   Standing     Neuromuscular Re-education: Manual Therapy:   Therapeutic Activity: Self Care: Trigger Point Dry Needling:  Modalities: thermotherapy to cervical spine and lumbar spine during supine interventions     PATIENT EDUCATION:  Education details: on HEP  Person educated: Patient Education method: Explanation, Facilities manager, and Handouts Education comprehension: verbalized  understanding   HOME EXERCISE PROGRAM: [id number]  ASSESSMENT:  CLINICAL IMPRESSION: 12/21/2023 Focused on exercises with use of exercise ball as this was tolerated well. Patient reported reduced stiffness and pain with these exercises. Printed off size of ball used for home use as patient interested in obtaining one for home use. Reduced pain noted end of session reporting 5/10. Overall patient doing well and will continue to benefit from skilled PT at this time.  Eval: Patient presents to physical therapy with complaints of low back pain, bilateral hip and leg pain as well as bilateral shoulder pain.  Symptoms have no mechanism of injury patient has been mobilizing with braces and using heat intermittently.  Pain has gradually increased over the last few weeks and was recommended to come here to help with her pain.  Patient has difficulties washing her hair, transitional movements in and out of chairs and walking around when her pain is really bad.  Patient presents with weakness, range of motion deficits and muscle guarding that is likely contributing to current presentation.  It is noted that upon end of session patient had improved transitional mobility and reported significantly less pain.  Patient would greatly benefit from skilled PT to improve overall function and quality of life.   OBJECTIVE IMPAIRMENTS: Abnormal gait, decreased activity tolerance, decreased balance, decreased mobility, difficulty walking, decreased ROM, decreased strength, impaired UE functional use, improper body mechanics, prosthetic dependency , and pain.   ACTIVITY LIMITATIONS: carrying, lifting, sitting, standing, squatting, stairs, transfers, bed mobility, dressing, reach over head, hygiene/grooming, locomotion level, and caring for others  PARTICIPATION LIMITATIONS: meal prep, cleaning, community activity, and occupation  PERSONAL FACTORS: Age, Fitness, and 3+ comorbidities: Bilateral neuropathy in hands and feet,  RA, chronic knee pain  are also affecting patient's functional outcome.   REHAB POTENTIAL: Good  CLINICAL DECISION MAKING: Stable/uncomplicated  EVALUATION COMPLEXITY: Low   GOALS: Goals reviewed with patient? yes  SHORT TERM GOALS: Target date: 11/30/2023  Patient will be independent in self management strategies to improve quality of life and functional outcomes. Baseline: New Program Goal  status: PROGRESSING  2.  Patient will report at least 50% improvement in overall symptoms and/or function to demonstrate improved functional mobility Baseline: 0% better Goal status: PROGRESSING  3.  Patient will be able to transition from sit to stand without the use of her hands and without pain to improve transitional movements Baseline: Unable Goal status: PROGRESSING     LONG TERM GOALS: Target date: 01/11/2024   Patient will report at least 75% improvement in overall symptoms and/or function to demonstrate improved functional mobility Baseline: 0% better Goal status: PROGRESSING  2.  Patient will improve score on FOTO outcomes measure to projected score to demonstrate overall improved function and QOL Baseline: see above Goal status: PROGRESSING  3.  Patient will demonstrate pain-free shoulder range of motion in both shoulders in all directions Baseline: Painful Goal status: PROGRESSING  4.  Patient will demonstrate pain-free hip range of motion in all directions in both hips Baseline: Painful Goal status: PROGRESSING   PLAN:  PT FREQUENCY: 1-2x/week for total of 16 visits over 12 weeks certification period  PT DURATION: 12 weeks  PLANNED INTERVENTIONS: 97110-Therapeutic exercises, 97530- Therapeutic activity, 97112- Neuromuscular re-education, 97535- Self Care, 86578- Manual therapy, [phone]- Gait training, [phone]- Orthotic Fit/training, [phone]- Canalith repositioning, [id number]- Aquatic Therapy, 97014- Electrical stimulation (unattended), [phone]- Ionotophoresis 4mg /ml  Dexamethasone, Patient/Family education, Balance training, Stair training, Taping, Dry Needling, Joint mobilization, Joint manipulation, Spinal manipulation, Spinal mobilization, Cryotherapy, and Moist heat   PLAN FOR NEXT SESSION: f/u with MD Assess balance, pain management strategies with heat and vibration, gentle range of motion exercises, strengthening once able  11:51 AM, 12/21/23 Tereasa Coop, DPT Physical Therapy with [facility]

## 2023-12-29 ENCOUNTER — Encounter: Admitting: Physical Therapy

## 2024-01-05 ENCOUNTER — Encounter: Admitting: Physical Therapy

## 2024-01-26 ENCOUNTER — Telehealth: Payer: Self-pay

## 2024-01-26 NOTE — Telephone Encounter (Deleted)
 Copied from CRM [id number]. Topic: General - Other >> Jan 26, 2024 12:05 PM Albertha Alosa wrote: Reason for CRM: Patient called in regarding wanting to speak with NP Trevor Fudge about getting a referral for a rheumatologist

## 2024-01-26 NOTE — Telephone Encounter (Signed)
 Copied from CRM [id number]. Topic: General - Other >> Jan 26, 2024 12:05 PM Angela Gross wrote: Reason for CRM: Patient called in regarding wanting to speak with NP Trevor Fudge about getting a referral for a rheumatologist   Pt needs a visit to discuss, May be virtual. Please schedule.   Thank you!

## 2024-01-28 ENCOUNTER — Encounter: Payer: Self-pay | Admitting: Family

## 2024-01-28 ENCOUNTER — Ambulatory Visit (INDEPENDENT_AMBULATORY_CARE_PROVIDER_SITE_OTHER): Admitting: Family

## 2024-01-28 VITALS — BP 144/92 | HR 76 | Temp 97.9°F | Ht 59.0 in | Wt 167.4 lb

## 2024-01-28 DIAGNOSIS — G620 Drug-induced polyneuropathy: Secondary | ICD-10-CM

## 2024-01-28 DIAGNOSIS — M0609 Rheumatoid arthritis without rheumatoid factor, multiple sites: Secondary | ICD-10-CM | POA: Diagnosis not present

## 2024-01-28 DIAGNOSIS — T451X5A Adverse effect of antineoplastic and immunosuppressive drugs, initial encounter: Secondary | ICD-10-CM

## 2024-01-28 DIAGNOSIS — I10 Essential (primary) hypertension: Secondary | ICD-10-CM | POA: Diagnosis not present

## 2024-01-28 MED ORDER — LOSARTAN POTASSIUM 25 MG PO TABS: 25.0000 mg | ORAL_TABLET | ORAL | 1 refills | Status: DC

## 2024-01-28 MED ORDER — PREGABALIN 75 MG PO CAPS: 75.0000 mg | ORAL_CAPSULE | Freq: Two times a day (BID) | ORAL | 3 refills | Status: DC

## 2024-01-28 NOTE — Assessment & Plan Note (Signed)
 Chronic rheumatoid arthritis with persistent joint pain and stiffness. Missed Rinvoq doses due to access issues. Symptoms improved with consistent use. Dissatisfied with current care, prefers single provider. - Continue Rinvoq and Plaquenil as ordered. - Complete Rinvoq application through [employer]. - Maintain hydration and avoid NSAIDs to protect kidney function. Use Tylenol 650mg  CR 2-3x/d for additional pain management. - Refer to new rheumatologist for evaluation and management. - Bring all medications to rheumatology appointment. - Communicate symptoms and treatment history to new rheumatologist.

## 2024-01-28 NOTE — Assessment & Plan Note (Signed)
 Peripheral neuropathy with numbness, stiffness, and pain in feet affecting balance. Lyrica provides some relief, but increased dosage needed. Kidney function limits medication options. - Increase Lyrica to 3 times a day, 2 doses in morning, 1 in evening, until current bottle finished. - Sending increased Lyrica dose 75mg  bid. - Monitor for drowsiness or dizziness with increased dosage. - Avoid NSAIDs due to kidney concerns. - Encourage hydration to support kidney function. - F/U in 3mos

## 2024-01-28 NOTE — Assessment & Plan Note (Signed)
 Taking Losartan 25mg  bid. BP elevated today despite pt reporting she took 2 pills this am.  - Increase Losartan to 50mg  in am and 25mg  in pm. - Sending refill with increased quantity - F/U in 3 mos

## 2024-01-28 NOTE — Progress Notes (Signed)
 Patient ID: Angela Gross, female    DOB: Oct 06, 1951, 73 y.o.   MRN: [id number]  Chief Complaint  Patient presents with   Fibromyalgia    Pt would like new referral to rheumatology.  Discussed the use of AI scribe software for clinical note transcription with the patient, who gave verbal consent to proceed.  History of Present Illness The patient, with a history of rheumatoid arthritis and HTN, presents with ongoing issues related to her medication, Rinvoq. She reports that due to insurance and cost issues, she has missed some doses of Rinvoq. She has been given two months' worth of samples by her rheumatologist's nurse and is awaiting an application from the pharmaceutical company for further assistance.  The patient also reports significant morning stiffness and pain, particularly in the lower back, shoulders, and neck. She describes the pain as so severe that it is difficult for her to get up in the morning. Despite these symptoms, she notes that her condition was slightly better when she was consistently on Rinvoq.  In addition to her rheumatoid arthritis, the patient has neuropathy in her feet, which she describes as feeling like she is "walking on rocks." This is being managed with Lyrica, but the patient states her sx are persisting. She also reports numbness and cramping in her fingers.  She also has HTN and has been taking Losartan 25mg  bid. She reports she took 2 pills this am. She reports having occasional headaches but no dizziness.  The patient expresses dissatisfaction with her current rheumatologist, feeling that her concerns are not being fully addressed. She is considering seeing a new rheumatologist and is seeking a referral.  Assessment & Plan Rheumatoid Arthritis Chronic rheumatoid arthritis with persistent joint pain and stiffness. Missed Rinvoq doses due to access issues. Symptoms improved with consistent use. Dissatisfied with current care, prefers single  provider. - Continue Rinvoq and Plaquenil as ordered. - Complete Rinvoq application through [employer]. - Maintain hydration and avoid NSAIDs to protect kidney function. Use Tylenol 650mg  CR 2-3x/d for additional pain management. - Refer to new rheumatologist for evaluation and management. - Bring all medications to rheumatology appointment. - Communicate symptoms and treatment history to new rheumatologist.  Peripheral Neuropathy Peripheral neuropathy with numbness, stiffness, and pain in feet affecting balance. Lyrica provides some relief, but increased dosage needed. Kidney function limits medication options. - Increase Lyrica to 3 times a day, 2 doses in morning, 1 in evening, until current bottle finished. - Sending increased Lyrica dose 75mg  bid. - Monitor for drowsiness or dizziness with increased dosage. - Avoid NSAIDs due to kidney concerns. - Encourage hydration to support kidney function. - F/U in 3mos  Hypertension - Taking Losartan 25mg  bid. BP elevated today despite pt reporting she took 2 pills this am.  - Increase Losartan to 50mg  in am and 25mg  in pm. - Sending refill with increased quantity - F/U in 3 mos    Subjective:    Outpatient Medications Prior to Visit  Medication Sig Dispense Refill   ACCU-CHEK GUIDE test strip USE TO TEST BLOOD SUGARS ONCE WEEKLY     atorvastatin (LIPITOR) 20 MG tablet Take 1 tablet (20 mg total) by mouth daily. 90 tablet 1   B Complex-C (B-COMPLEX WITH VITAMIN C) tablet Take 1 tablet by mouth daily.     colchicine 0.6 MG tablet Take 1 tablet (0.6 mg total) by mouth daily as needed (For Gout pain. Take for 5 days only.). 30 tablet 0   febuxostat (ULORIC) 40 MG  tablet Take 1 tablet (40 mg total) by mouth daily.     fluticasone (FLONASE) 50 MCG/ACT nasal spray Place into both nostrils.     furosemide (LASIX) 20 MG tablet Take 20 mg by mouth daily as needed for fluid.     hydroxychloroquine (PLAQUENIL) 200 MG tablet 1 tab Orally twice a day  for 30 days     losartan (COZAAR) 25 MG tablet Take 1 tablet (25 mg total) by mouth in the morning and at bedtime. 180 tablet 1   Olopatadine HCl 0.2 % SOLN Apply 1 drop to eye daily. 2.5 mL 3   potassium chloride SA (KLOR-CON M) 20 MEQ tablet Take 1 tablet (20 mEq total) by mouth as directed. Take each day you take a Lasix pill.     pregabalin (LYRICA) 50 MG capsule Take 1 capsule (50 mg total) by mouth 2 (two) times daily. 60 capsule 2   Upadacitinib ER (RINVOQ) 15 MG TB24 Take by mouth.     Vitamin D, Cholecalciferol, 25 MCG (1000 UT) CAPS Take 25 mcg by mouth daily.     No facility-administered medications prior to visit.   Past Medical History:  Diagnosis Date   Anxiety    Breast cancer (HCC)    "right" (01/10/2014)   Chronic left shoulder pain    "work related injury" (01/10/2014)   Constipation 10/04/2014   Degenerative disc disease, cervical    Elevated blood pressure reading without diagnosis of hypertension 09/15/2023   Fibromyalgia    Full dentures    History of breast cancer in female 10/25/2013   Hot flashes, menopausal 04/04/2014   Hypertension    Osteoarthritis of both knees    Osteoporosis    Personal history of chemotherapy    PONV (postoperative nausea and vomiting)    Raynaud disease    Rheumatoid arthritis (HCC)    "elbows; fingers; toes"   Syncope and collapse    "4 times in the last year" (01/10/2014)   Wears glasses    Past Surgical History:  Procedure Laterality Date   BREAST BIOPSY Right 10/2013   BREAST BIOPSY Left 07/22/2023   MM LT BREAST BX W LOC DEV 1ST LESION IMAGE BX SPEC STEREO GUIDE 07/22/2023 [facility]   BREAST BIOPSY Left 07/22/2023   MM LT BREAST BX W LOC DEV EA AD LESION IMG BX SPEC STEREO GUIDE 07/22/2023 [facility]   BREAST IMPLANT EXCHANGE Right 02/12/2016   Procedure: EXCHANGE SILICONE IMPLANT ON RIGHT, CAPSULORRAPHY AND PLACEMENT OF ALLODERM;  Surgeon: Alger Infield, MD;  Location: [facility];  Service:  Plastics;  Laterality: Right;   BREAST RECONSTRUCTION Right 12/12/2014   Procedure: REVISION OF RIGHT RECONSTRUCTIVE BREAST WITH CAPSULORRHAPHY PLACEMENT OF SILCONE IMPLANTS AND LIPOFILLING TO RIGHT CHEST ;  Surgeon: Alger Infield, MD;  Location: [facility];  Service: Plastics;  Laterality: Right;   BREAST RECONSTRUCTION Right 03/06/2015   Procedure: RIGHT NIPPLE AEROLA COMPLEX RECONSTRUCTION AND LOCAL FLAP WITH LIPO FILLING TO RIGHT BREAST,REMOVAL RIGHT CHEST PORT;  Surgeon: Alger Infield, MD;  Location: [facility];  Service: Plastics;  Laterality: Right;   BREAST RECONSTRUCTION Right 05/18/2018   Procedure: revision right breast reconstruction with silicone implant exchange;  Surgeon: Alger Infield, MD;  Location: [facility];  Service: Plastics;  Laterality: Right;   BREAST RECONSTRUCTION WITH PLACEMENT OF TISSUE EXPANDER AND FLEX HD (ACELLULAR HYDRATED DERMIS) Right 12/13/2013   Procedure: RIGHT BREAST RECONSTRUCTION WITH PLACEMENT OF TISSUE EXPANDER AND FLEX HD (ACELLULAR HYDRATED DERMIS);  Surgeon: Alger Infield, MD;  Location: [facility];  Service: Plastics;  Laterality: Right;   CHOLECYSTECTOMY  1970's   COLONOSCOPY     LIPOMA EXCISION Bilateral 12/13/2013   Procedure: EXCISION OF LEFT BREAST KELOID AND RIGHT CHEST KELOID;  Surgeon: Alger Infield, MD;  Location: [facility];  Service: Plastics;  Laterality: Bilateral;   LIPOSUCTION WITH LIPOFILLING Right 09/26/2014   Procedure: LIPOSUCTION WITH LIPOFILLING;  Surgeon: Alger Infield, MD;  Location: [facility];  Service: Plastics;  Laterality: Right;   LIPOSUCTION WITH LIPOFILLING Right 12/12/2014   Procedure: LIPOSUCTION WITH LIPOFILLING;  Surgeon: Alger Infield, MD;  Location: [facility];  Service: Plastics;  Laterality: Right;   LIPOSUCTION WITH LIPOFILLING Right 03/06/2015   Procedure: ABDOMINAL LIPOSUCTION WITH LIPOFILLING TO  RIGHT BREAST;  Surgeon: Alger Infield, MD;  Location: [facility];  Service: Plastics;  Laterality: Right;   MASTECTOMY Right 2015   MASTECTOMY W/ SENTINEL NODE BIOPSY Right 12/13/2013   Procedure: RIGHT TOTAL MASTECTOMY WITH SENTINEL LYMPH NODE BIOPSY;  Surgeon: Levert Ready, MD;  Location: [facility];  Service: General;  Laterality: Right;   MASTOPEXY Left 09/26/2014   Procedure: MASTOPEXY;  Surgeon: Alger Infield, MD;  Location: [facility];  Service: Plastics;  Laterality: Left;   PORT-A-CATH REMOVAL Right 03/06/2015   Procedure: REMOVAL PORT-A-CATH;  Surgeon: Alger Infield, MD;  Location: [facility];  Service: Plastics;  Laterality: Right;   PORTACATH PLACEMENT Right 12/13/2013   Procedure: INSERTION PORT-A-CATH;  Surgeon: Levert Ready, MD;  Location: [facility];  Service: General;  Laterality: Right;   REDUCTION MAMMAPLASTY Left 2015   REMOVAL OF BILATERAL TISSUE EXPANDERS WITH PLACEMENT OF BILATERAL BREAST IMPLANTS Bilateral 09/26/2014   Procedure: REMOVAL OF RIGHT TISSUE EXPANDER WITH PLACEMENT OF RIGHT BREAST IMPLANT LIPOFILLING TO RIGHT BREAST/LEFT BREAST MASTOPEXY FOR SYMMETRY;  Surgeon: Alger Infield, MD;  Location: [facility];  Service: Plastics;  Laterality: Bilateral;   TISSUE EXPANDER PLACEMENT Right 01/10/2014   Procedure: Incision and Drainage Right Breast Seroma with TISSUE EXPANDER exchange;  Surgeon: Alger Infield, MD;  Location: [facility];  Service: Plastics;  Laterality: Right;   VAGINAL HYSTERECTOMY  1980   no salpingo-oophoretomu   Allergies  Allergen Reactions   Codeine Hives and Other (See Comments)    Altered mental status, numbness    Diazepam Other (See Comments)    Delusions, hallucinations       Objective:    Physical Exam Vitals and nursing note reviewed.  Constitutional:      Appearance: Normal appearance.  Cardiovascular:     Rate and Rhythm:  Normal rate and regular rhythm.  Pulmonary:     Effort: Pulmonary effort is normal.     Breath sounds: Normal breath sounds.  Musculoskeletal:        General: Normal range of motion.  Skin:    General: Skin is warm and dry.  Neurological:     Mental Status: She is alert.  Psychiatric:        Mood and Affect: Mood normal.        Behavior: Behavior normal.    BP (!) 144/92 (BP Location: Left Arm, Patient Position: Sitting, Cuff Size: Large)   Pulse 76   Temp 97.9 F (36.6 C) (Temporal)   Ht 4\' 11"  (1.499 m)   Wt 167 lb 6.4 oz (75.9 kg)   SpO2 96%   BMI 33.81 kg/m  Wt Readings from Last  3 Encounters:  01/28/24 167 lb 6.4 oz (75.9 kg)  12/04/23 163 lb 3.2 oz (74 kg)  11/10/23 164 lb (74.4 kg)       Versa Gore, NP

## 2024-02-02 DIAGNOSIS — M545 Low back pain, unspecified: Secondary | ICD-10-CM | POA: Diagnosis not present

## 2024-02-10 ENCOUNTER — Ambulatory Visit (INDEPENDENT_AMBULATORY_CARE_PROVIDER_SITE_OTHER): Admitting: Family

## 2024-02-10 VITALS — BP 131/92 | HR 99 | Temp 98.2°F | Ht 59.0 in | Wt 164.0 lb

## 2024-02-10 DIAGNOSIS — M0609 Rheumatoid arthritis without rheumatoid factor, multiple sites: Secondary | ICD-10-CM

## 2024-02-10 MED ORDER — PREDNISONE 10 MG PO TABS
ORAL_TABLET | ORAL | 0 refills | Status: DC
Start: 2024-02-10 — End: 2024-03-04

## 2024-02-10 NOTE — Progress Notes (Signed)
 Patient ID: Angela Gross, female    DOB: 1951-02-17, 73 y.o.   MRN: [id number]  Chief Complaint  Patient presents with  . Shoulder Pain    Pt c/o righ shoulder pain, present 1 week. Pain is achy.   . Leg Pain    Pt c/o left hip pain, present for 1 week. Has tried tylenol  which did not help sx.   Discussed the use of AI scribe software for clinical note transcription with the patient, who gave verbal consent to proceed.  History of Present Illness Angela Gross is a 73 year old female with rheumatoid arthritis who presents with worsening musculoskeletal pain. She experiences arm pain originating from her right shoulder and extending to her hand. with occasional hand locking. She also has chronic left hip & groin pain which radiates down her leg, across her lower back, tailbone, and under her buttocks. Pain worsens throughout the day, and she struggles to place her foot on the floor in the morning. She has been on Rinvoq for two months without significant symptom relief. Prednisone  taper pack for 12 days, provided minimal relief. Previous prednisone  treatments offered temporary relief. Lyrica , recently increased to two pills in the morning and one in the evening, has reduced tingling and numbness. She also takes Plaquenil twice daily. Last visit she requested a new Rheumatology referral, but that appointment is not until September.  Her past medical history includes breast cancer with lymph node removal from her arm. She denies swelling or lymphedema but notes persistent aching. Imaging of the hip shows no bone displacement or fracture, yet hip and groin pain persist. No arm swelling is observed, and pain is primarily in muscle and joint areas.  Assessment & Plan Chronic hip pain - Chronic left hip/groin pain radiating to leg and lower back, unresponsive to current medications. Previous imaging did not detect arthritis, may need MRI. Discussed prednisone  risks and steroid injection  benefits. - Prescribe prednisone  dose pack for 10d.  - Consult Dr. Marylin So for alternative treatments for RA pain. - Consult Dr. Alease Hunter for possible hip steroid injection.  Rheumatoid arthritis - Inadequate response to Rinvoq (taking samples for 2 mos) and Plaquenil bid. Pain and stiffness persist. Last Prednisone  dose pack end of March. Lyrica  increased for tingling and numbness with some improvement. - Sending new Prednisone  dose pack, reminded pt on use & SE. - Continue Rinvoq and Plaquenil, call current Rheumatologist for appointment to discuss alternative treatments. New Rheum appt not till September. - Continue Lyrica  bid.  Pain in right arm -  Persistent right shoulder pain down the arm to the hand. Also reports left hand locking. No swelling or lymphedema post-lymph node removal on right side. - Sending new Prednisone  dose pack - Advise consulting with Dr. Alease [facility] and letting Rheumatologist know of overall persistent joint pain. - Will continue to monitor   Subjective:    Outpatient Medications Prior to Visit  Medication Sig Dispense Refill  . ACCU-CHEK GUIDE test strip USE TO TEST BLOOD SUGARS ONCE WEEKLY    . atorvastatin  (LIPITOR) 20 MG tablet Take 1 tablet (20 mg total) by mouth daily. 90 tablet 1  . B Complex-C (B-COMPLEX WITH VITAMIN C ) tablet Take 1 tablet by mouth daily.    . colchicine  0.6 MG tablet Take 1 tablet (0.6 mg total) by mouth daily as needed (For Gout pain. Take for 5 days only.). 30 tablet 0  . febuxostat  (ULORIC ) 40 MG tablet Take 1 tablet (40 mg  total) by mouth daily.    . fluticasone (FLONASE) 50 MCG/ACT nasal spray Place into both nostrils.    . furosemide (LASIX) 20 MG tablet Take 20 mg by mouth daily as needed for fluid.    . hydroxychloroquine (PLAQUENIL) 200 MG tablet 1 tab Orally twice a day for 30 days    . losartan  (COZAAR ) 25 MG tablet Take 1 tablet (25 mg total) by mouth as directed. Take 2 tabs in the morning and 1 tab in the  evening. 270 tablet 1  . Olopatadine  HCl 0.2 % SOLN Apply 1 drop to eye daily. 2.5 mL 3  . potassium chloride  SA (KLOR-CON  M) 20 MEQ tablet Take 1 tablet (20 mEq total) by mouth as directed. Take each day you take a Lasix pill.    . pregabalin  (LYRICA ) 75 MG capsule Take 1 capsule (75 mg total) by mouth 2 (two) times daily. 60 capsule 3  . Upadacitinib ER (RINVOQ) 15 MG TB24 Take by mouth.    . Vitamin D , Cholecalciferol , 25 MCG (1000 UT) CAPS Take 25 mcg by mouth daily.     No facility-administered medications prior to visit.   Past Medical History:  Diagnosis Date  . Anxiety   . Breast cancer (HCC)    "right" (01/10/2014)  . Chronic left shoulder pain    "work related injury" (01/10/2014)  . Constipation 10/04/2014  . Degenerative disc disease, cervical   . Elevated blood pressure reading without diagnosis of hypertension 09/15/2023  . Fibromyalgia   . Full dentures   . History of breast cancer in female 10/25/2013  . Hot flashes, menopausal 04/04/2014  . Hypertension   . Osteoarthritis of both knees   . Osteoporosis   . Personal history of chemotherapy   . PONV (postoperative nausea and vomiting)   . Raynaud disease   . Rheumatoid arthritis (HCC)    "elbows; fingers; toes"  . Syncope and collapse    "4 times in the last year" (01/10/2014)  . Wears glasses    Past Surgical History:  Procedure Laterality Date  . BREAST BIOPSY Right 10/2013  . BREAST BIOPSY Left 07/22/2023   MM LT BREAST BX W LOC DEV 1ST LESION IMAGE BX SPEC STEREO GUIDE 07/22/2023 [facility]  . BREAST BIOPSY Left 07/22/2023   MM LT BREAST BX W LOC DEV EA AD LESION IMG BX SPEC STEREO GUIDE 07/22/2023 [facility]  . BREAST IMPLANT EXCHANGE Right 02/12/2016   Procedure: EXCHANGE SILICONE IMPLANT ON RIGHT, CAPSULORRAPHY AND PLACEMENT OF ALLODERM;  Surgeon: Alger Infield, MD;  Location: [facility];  Service: Plastics;  Laterality: Right;  . BREAST RECONSTRUCTION Right 12/12/2014    Procedure: REVISION OF RIGHT RECONSTRUCTIVE BREAST WITH CAPSULORRHAPHY PLACEMENT OF SILCONE IMPLANTS AND LIPOFILLING TO RIGHT CHEST ;  Surgeon: Alger Infield, MD;  Location: [facility];  Service: Plastics;  Laterality: Right;  . BREAST RECONSTRUCTION Right 03/06/2015   Procedure: RIGHT NIPPLE AEROLA COMPLEX RECONSTRUCTION AND LOCAL FLAP WITH LIPO FILLING TO RIGHT BREAST,REMOVAL RIGHT CHEST PORT;  Surgeon: Alger Infield, MD;  Location: [facility];  Service: Plastics;  Laterality: Right;  . BREAST RECONSTRUCTION Right 05/18/2018   Procedure: revision right breast reconstruction with silicone implant exchange;  Surgeon: Alger Infield, MD;  Location: [facility];  Service: Plastics;  Laterality: Right;  . BREAST RECONSTRUCTION WITH PLACEMENT OF TISSUE EXPANDER AND FLEX HD (ACELLULAR HYDRATED DERMIS) Right 12/13/2013   Procedure: RIGHT BREAST RECONSTRUCTION WITH PLACEMENT OF TISSUE EXPANDER AND FLEX HD (ACELLULAR  HYDRATED DERMIS);  Surgeon: Alger Infield, MD;  Location: [facility];  Service: Plastics;  Laterality: Right;  . CHOLECYSTECTOMY  1970's  . COLONOSCOPY    . LIPOMA EXCISION Bilateral 12/13/2013   Procedure: EXCISION OF LEFT BREAST KELOID AND RIGHT CHEST KELOID;  Surgeon: Alger Infield, MD;  Location: [facility];  Service: Plastics;  Laterality: Bilateral;  . LIPOSUCTION WITH LIPOFILLING Right 09/26/2014   Procedure: LIPOSUCTION WITH LIPOFILLING;  Surgeon: Alger Infield, MD;  Location: [facility];  Service: Plastics;  Laterality: Right;  . LIPOSUCTION WITH LIPOFILLING Right 12/12/2014   Procedure: LIPOSUCTION WITH LIPOFILLING;  Surgeon: Alger Infield, MD;  Location: [facility];  Service: Plastics;  Laterality: Right;  . LIPOSUCTION WITH LIPOFILLING Right 03/06/2015   Procedure: ABDOMINAL LIPOSUCTION WITH LIPOFILLING TO RIGHT BREAST;  Surgeon: Alger Infield, MD;  Location: [facility];  Service: Plastics;  Laterality: Right;  . MASTECTOMY Right 2015  . MASTECTOMY W/ SENTINEL NODE BIOPSY Right 12/13/2013   Procedure: RIGHT TOTAL MASTECTOMY WITH SENTINEL LYMPH NODE BIOPSY;  Surgeon: Levert Ready, MD;  Location: [facility];  Service: General;  Laterality: Right;  . MASTOPEXY Left 09/26/2014   Procedure: MASTOPEXY;  Surgeon: Alger Infield, MD;  Location: [facility];  Service: Plastics;  Laterality: Left;  . PORT-A-CATH REMOVAL Right 03/06/2015   Procedure: REMOVAL PORT-A-CATH;  Surgeon: Alger Infield, MD;  Location: [facility];  Service: Plastics;  Laterality: Right;  . PORTACATH PLACEMENT Right 12/13/2013   Procedure: INSERTION PORT-A-CATH;  Surgeon: Levert Ready, MD;  Location: [facility];  Service: General;  Laterality: Right;  . REDUCTION MAMMAPLASTY Left 2015  . REMOVAL OF BILATERAL TISSUE EXPANDERS WITH PLACEMENT OF BILATERAL BREAST IMPLANTS Bilateral 09/26/2014   Procedure: REMOVAL OF RIGHT TISSUE EXPANDER WITH PLACEMENT OF RIGHT BREAST IMPLANT LIPOFILLING TO RIGHT BREAST/LEFT BREAST MASTOPEXY FOR SYMMETRY;  Surgeon: Alger Infield, MD;  Location: [facility];  Service: Plastics;  Laterality: Bilateral;  . TISSUE EXPANDER PLACEMENT Right 01/10/2014   Procedure: Incision and Drainage Right Breast Seroma with TISSUE EXPANDER exchange;  Surgeon: Alger Infield, MD;  Location: [facility];  Service: Plastics;  Laterality: Right;  Aaron Aas VAGINAL HYSTERECTOMY  1980   no salpingo-oophoretomu   Allergies  Allergen Reactions  . Codeine Hives and Other (See Comments)    Altered mental status, numbness   . Diazepam Other (See Comments)    Delusions, hallucinations       Objective:    Physical Exam Vitals and nursing note reviewed.  Constitutional:      Appearance: Normal appearance. She is obese.  Cardiovascular:     Rate and Rhythm: Normal rate and regular rhythm.   Pulmonary:     Effort: Pulmonary effort is normal.     Breath sounds: Normal breath sounds.  Musculoskeletal:     Right shoulder: Tenderness present. No swelling. Decreased range of motion.     Left hand: Bony tenderness present. Decreased range of motion.     Left hip: Decreased range of motion. Decreased strength.  Skin:    General: Skin is warm and dry.  Neurological:     Mental Status: She is alert.  Psychiatric:        Mood and Affect: Mood normal.        Behavior: Behavior normal.   BP (!) 131/92 (BP Location: Left Arm, Patient Position: Sitting, Cuff Size: Large)   Pulse 99   Temp 98.2 F (36.8  C) (Temporal)   Ht 4\' 11"  (1.499 m)   Wt 164 lb (74.4 kg)   SpO2 97%   BMI 33.12 kg/m  Wt Readings from Last 3 Encounters:  02/10/24 164 lb (74.4 kg)  01/28/24 167 lb 6.4 oz (75.9 kg)  12/04/23 163 lb 3.2 oz (74 kg)      Versa Gore, NP

## 2024-02-10 NOTE — Assessment & Plan Note (Signed)
 Inadequate response to Rinvoq (taking samples for 2 mos) and Plaquenil bid. Pain and stiffness persist. Last Prednisone  dose pack end of March. Lyrica  increased for tingling and numbness with some improvement. - Sending new Prednisone  dose pack, reminded pt on use & SE. - Continue Rinvoq and Plaquenil, call current Rheumatologist for appointment to discuss alternative treatments. New Rheum appt not till September. - Continue Lyrica  bid. - Will continue to monitor

## 2024-03-02 ENCOUNTER — Ambulatory Visit: Payer: Medicare Other | Admitting: Family

## 2024-03-04 ENCOUNTER — Ambulatory Visit (INDEPENDENT_AMBULATORY_CARE_PROVIDER_SITE_OTHER): Admitting: Family

## 2024-03-04 ENCOUNTER — Encounter: Payer: Self-pay | Admitting: Family

## 2024-03-04 VITALS — BP 133/87 | HR 77 | Temp 97.3°F | Ht 59.0 in | Wt 165.5 lb

## 2024-03-04 DIAGNOSIS — G620 Drug-induced polyneuropathy: Secondary | ICD-10-CM | POA: Diagnosis not present

## 2024-03-04 DIAGNOSIS — M0609 Rheumatoid arthritis without rheumatoid factor, multiple sites: Secondary | ICD-10-CM

## 2024-03-04 DIAGNOSIS — Z79899 Other long term (current) drug therapy: Secondary | ICD-10-CM

## 2024-03-04 DIAGNOSIS — N1832 Chronic kidney disease, stage 3b: Secondary | ICD-10-CM

## 2024-03-04 DIAGNOSIS — M1A4790 Other secondary chronic gout, unspecified ankle and foot, without tophus (tophi): Secondary | ICD-10-CM

## 2024-03-04 DIAGNOSIS — E782 Mixed hyperlipidemia: Secondary | ICD-10-CM | POA: Diagnosis not present

## 2024-03-04 LAB — COMPREHENSIVE METABOLIC PANEL WITH GFR
ALT: 21 U/L (ref 0–35)
AST: 28 U/L (ref 0–37)
Albumin: 4.6 g/dL (ref 3.5–5.2)
Alkaline Phosphatase: 49 U/L (ref 39–117)
BUN: 17 mg/dL (ref 6–23)
CO2: 26 meq/L (ref 19–32)
Calcium: 9.7 mg/dL (ref 8.4–10.5)
Chloride: 108 meq/L (ref 96–112)
Creatinine, Ser: 1.29 mg/dL — ABNORMAL HIGH (ref 0.40–1.20)
GFR: 41.98 mL/min — ABNORMAL LOW (ref >60.00)
Glucose, Bld: 91 mg/dL (ref 70–99)
Potassium: 4 meq/L (ref 3.5–5.1)
Sodium: 143 meq/L (ref 135–145)
Total Bilirubin: 0.7 mg/dL (ref 0.2–1.2)
Total Protein: 6.9 g/dL (ref 6.0–8.3)

## 2024-03-04 LAB — LIPID PANEL
Cholesterol: 137 mg/dL (ref 0–200)
HDL: 56.9 mg/dL (ref >39.00)
LDL Cholesterol: 55 mg/dL (ref 0–99)
NonHDL: 80.33
Total CHOL/HDL Ratio: 2
Triglycerides: 126 mg/dL (ref 0.0–149.0)
VLDL: 25.2 mg/dL (ref 0.0–40.0)

## 2024-03-04 NOTE — Assessment & Plan Note (Signed)
 Tingling and numbness managed with Lyrica  75 mg bid. Some improvement. - Check kidney fx today. - Continue Lyrica  75 mg bid. - F/U in 3-62mos

## 2024-03-04 NOTE — Assessment & Plan Note (Signed)
 Managed with Uloric  40mg  qd and colchicine . No recent episodes. Dietary modifications discussed to prevent flares. - Consider discontinuing Uloric  if no gout episodes occur. - Continue colchicine  0.6mg  as needed for symptoms. - Advise dietary modifications to prevent flares, including limiting red meat and increasing seafood intake. - Recommend yogurt and cherry juice for symptom relief. - F/U in 3-25mos

## 2024-03-04 NOTE — Progress Notes (Signed)
 Patient ID: Angela Gross, female    DOB: 06/24/1951, 73 y.o.   MRN: [id number]  Chief Complaint  Patient presents with   Chronic Kidney Disease  Discussed the use of AI scribe software for clinical note transcription with the patient, who gave verbal consent to proceed.  History of Present Illness Angela Gross is a 73 year old female who presents for a recheck of kidney function and fasting labs.  She is undergoing a recheck of kidney function due to her medication regimen, which includes Lyrica  and Uloric . She is also scheduled for fasting labs for cholesterol monitoring and is taking atorvastatin  20 mg daily.  She has a history of gout with episodes affecting her toes and knees. Currently, she has swelling around her ankles without pain, redness, or burning. Her gout symptoms are typically triggered by dietary factors, particularly meat consumption. She takes Uloric  40 mg daily and colchicine  as needed, which recently provided relief for ankle swelling.  She experiences neuropathy, primarily in her hands, and takes Lyrica  75 mg, which alleviates some tingling and numbness. She also has redness and pain on the bottom of her foot after prolonged standing, attributed to neuropathy.  She is on losartan  for blood pressure management but did not take it today due to fasting for labs.  Assessment & Plan Chronic Kidney Disease Monitoring required due to potential nephrotoxic effects of Lyrica  and Uloric . - Recheck kidney function today. - Consider discontinuing Uloric  if kidney function is affected and no gout episodes have occurred. - F/U in 3-4 mos  Gout Managed with Uloric  40mg  qd and colchicine . No recent episodes. Dietary modifications discussed to prevent flares. - Consider discontinuing Uloric  if no gout episodes occur. - Continue colchicine  0.6mg  as needed for symptoms. - Advise dietary modifications to prevent flares, including limiting red meat and increasing seafood  intake. - Recommend yogurt and cherry juice for symptom relief. - F/U in 3-57mos  Neuropathy - Tingling and numbness managed with Lyrica  75 mg bid. Some improvement. - Check kidney fx today. - Continue Lyrica  75 mg bid, no refill needed today. - F/U in 3-24mos   Subjective:     Outpatient Medications Prior to Visit  Medication Sig Dispense Refill   ACCU-CHEK GUIDE test strip USE TO TEST BLOOD SUGARS ONCE WEEKLY     atorvastatin  (LIPITOR) 20 MG tablet Take 1 tablet (20 mg total) by mouth daily. 90 tablet 1   B Complex-C (B-COMPLEX WITH VITAMIN C ) tablet Take 1 tablet by mouth daily.     colchicine  0.6 MG tablet Take 1 tablet (0.6 mg total) by mouth daily as needed (For Gout pain. Take for 5 days only.). 30 tablet 0   febuxostat  (ULORIC ) 40 MG tablet Take 1 tablet (40 mg total) by mouth daily.     fluticasone (FLONASE) 50 MCG/ACT nasal spray Place into both nostrils.     furosemide (LASIX) 20 MG tablet Take 20 mg by mouth daily as needed for fluid.     hydroxychloroquine (PLAQUENIL) 200 MG tablet 1 tab Orally twice a day for 30 days     losartan  (COZAAR ) 25 MG tablet Take 1 tablet (25 mg total) by mouth as directed. Take 2 tabs in the morning and 1 tab in the evening. 270 tablet 1   Olopatadine  HCl 0.2 % SOLN Apply 1 drop to eye daily. 2.5 mL 3   potassium chloride  SA (KLOR-CON  M) 20 MEQ tablet Take 1 tablet (20 mEq total) by mouth as directed. Take each day  you take a Lasix pill.     pregabalin  (LYRICA ) 75 MG capsule Take 1 capsule (75 mg total) by mouth 2 (two) times daily. 60 capsule 3   Upadacitinib ER (RINVOQ) 15 MG TB24 Take by mouth.     Vitamin D , Cholecalciferol , 25 MCG (1000 UT) CAPS Take 25 mcg by mouth daily.     predniSONE  (DELTASONE ) 10 MG tablet Take with breakfast:  6 tab day 1, 5 tab day 2-3, 4 tab day 4, 3 tab day 5, 2 tab day 6-7, 1 tab day 8-10. (Patient not taking: Reported on 03/04/2024) 30 tablet 0   No facility-administered medications prior to visit.   Past  Medical History:  Diagnosis Date   Anxiety    Breast cancer (HCC)    "right" (01/10/2014)   Chronic left shoulder pain    "work related injury" (01/10/2014)   Constipation 10/04/2014   Degenerative disc disease, cervical    Elevated blood pressure reading without diagnosis of hypertension 09/15/2023   Fibromyalgia    Full dentures    History of breast cancer in female 10/25/2013   Hot flashes, menopausal 04/04/2014   Hypertension    Osteoarthritis of both knees    Osteoporosis    Personal history of chemotherapy    PONV (postoperative nausea and vomiting)    Raynaud disease    Rheumatoid arthritis (HCC)    "elbows; fingers; toes"   Syncope and collapse    "4 times in the last year" (01/10/2014)   Wears glasses    Past Surgical History:  Procedure Laterality Date   BREAST BIOPSY Right 10/2013   BREAST BIOPSY Left 07/22/2023   MM LT BREAST BX W LOC DEV 1ST LESION IMAGE BX SPEC STEREO GUIDE 07/22/2023 [facility]   BREAST BIOPSY Left 07/22/2023   MM LT BREAST BX W LOC DEV EA AD LESION IMG BX SPEC STEREO GUIDE 07/22/2023 [facility]   BREAST IMPLANT EXCHANGE Right 02/12/2016   Procedure: EXCHANGE SILICONE IMPLANT ON RIGHT, CAPSULORRAPHY AND PLACEMENT OF ALLODERM;  Surgeon: Alger Infield, MD;  Location: [facility];  Service: Plastics;  Laterality: Right;   BREAST RECONSTRUCTION Right 12/12/2014   Procedure: REVISION OF RIGHT RECONSTRUCTIVE BREAST WITH CAPSULORRHAPHY PLACEMENT OF SILCONE IMPLANTS AND LIPOFILLING TO RIGHT CHEST ;  Surgeon: Alger Infield, MD;  Location: [facility];  Service: Plastics;  Laterality: Right;   BREAST RECONSTRUCTION Right 03/06/2015   Procedure: RIGHT NIPPLE AEROLA COMPLEX RECONSTRUCTION AND LOCAL FLAP WITH LIPO FILLING TO RIGHT BREAST,REMOVAL RIGHT CHEST PORT;  Surgeon: Alger Infield, MD;  Location: [facility];  Service: Plastics;  Laterality: Right;   BREAST RECONSTRUCTION Right 05/18/2018   Procedure:  revision right breast reconstruction with silicone implant exchange;  Surgeon: Alger Infield, MD;  Location: [facility];  Service: Plastics;  Laterality: Right;   BREAST RECONSTRUCTION WITH PLACEMENT OF TISSUE EXPANDER AND FLEX HD (ACELLULAR HYDRATED DERMIS) Right 12/13/2013   Procedure: RIGHT BREAST RECONSTRUCTION WITH PLACEMENT OF TISSUE EXPANDER AND FLEX HD (ACELLULAR HYDRATED DERMIS);  Surgeon: Alger Infield, MD;  Location: [facility];  Service: Plastics;  Laterality: Right;   CHOLECYSTECTOMY  1970's   COLONOSCOPY     LIPOMA EXCISION Bilateral 12/13/2013   Procedure: EXCISION OF LEFT BREAST KELOID AND RIGHT CHEST KELOID;  Surgeon: Alger Infield, MD;  Location: [facility];  Service: Plastics;  Laterality: Bilateral;   LIPOSUCTION WITH LIPOFILLING Right 09/26/2014   Procedure: LIPOSUCTION WITH LIPOFILLING;  Surgeon: Alger Infield, MD;  Location:  [facility];  Service: Plastics;  Laterality: Right;   LIPOSUCTION WITH LIPOFILLING Right 12/12/2014   Procedure: LIPOSUCTION WITH LIPOFILLING;  Surgeon: Alger Infield, MD;  Location: [facility];  Service: Plastics;  Laterality: Right;   LIPOSUCTION WITH LIPOFILLING Right 03/06/2015   Procedure: ABDOMINAL LIPOSUCTION WITH LIPOFILLING TO RIGHT BREAST;  Surgeon: Alger Infield, MD;  Location: [facility];  Service: Plastics;  Laterality: Right;   MASTECTOMY Right 2015   MASTECTOMY W/ SENTINEL NODE BIOPSY Right 12/13/2013   Procedure: RIGHT TOTAL MASTECTOMY WITH SENTINEL LYMPH NODE BIOPSY;  Surgeon: Levert Ready, MD;  Location: [facility];  Service: General;  Laterality: Right;   MASTOPEXY Left 09/26/2014   Procedure: MASTOPEXY;  Surgeon: Alger Infield, MD;  Location: [facility];  Service: Plastics;  Laterality: Left;   PORT-A-CATH REMOVAL Right 03/06/2015   Procedure: REMOVAL PORT-A-CATH;  Surgeon: Alger Infield, MD;   Location: [facility];  Service: Plastics;  Laterality: Right;   PORTACATH PLACEMENT Right 12/13/2013   Procedure: INSERTION PORT-A-CATH;  Surgeon: Levert Ready, MD;  Location: [facility];  Service: General;  Laterality: Right;   REDUCTION MAMMAPLASTY Left 2015   REMOVAL OF BILATERAL TISSUE EXPANDERS WITH PLACEMENT OF BILATERAL BREAST IMPLANTS Bilateral 09/26/2014   Procedure: REMOVAL OF RIGHT TISSUE EXPANDER WITH PLACEMENT OF RIGHT BREAST IMPLANT LIPOFILLING TO RIGHT BREAST/LEFT BREAST MASTOPEXY FOR SYMMETRY;  Surgeon: Alger Infield, MD;  Location: [facility];  Service: Plastics;  Laterality: Bilateral;   TISSUE EXPANDER PLACEMENT Right 01/10/2014   Procedure: Incision and Drainage Right Breast Seroma with TISSUE EXPANDER exchange;  Surgeon: Alger Infield, MD;  Location: [facility];  Service: Plastics;  Laterality: Right;   VAGINAL HYSTERECTOMY  1980   no [id number]-oophoretomu   Allergies  Allergen Reactions   Codeine Hives and Other (See Comments)    Altered mental status, numbness    Diazepam Other (See Comments)    Delusions, hallucinations       Objective:    Physical Exam Vitals and nursing note reviewed.  Constitutional:      Appearance: Normal appearance. She is obese.  Cardiovascular:     Rate and Rhythm: Normal rate and regular rhythm.  Pulmonary:     Effort: Pulmonary effort is normal.     Breath sounds: Normal breath sounds.  Musculoskeletal:        General: Normal range of motion.  Skin:    General: Skin is warm and dry.  Neurological:     Mental Status: She is alert.  Psychiatric:        Mood and Affect: Mood normal.        Behavior: Behavior normal.    BP 133/87   Pulse 77   Temp (!) 97.3 F (36.3 C) (Temporal)   Ht 4\' 11"  (1.499 m)   Wt 165 lb 8 oz (75.1 kg)   SpO2 98%   BMI 33.43 kg/m  Wt Readings from Last 3 Encounters:  03/04/24 165 lb 8 oz (75.1 kg)  02/10/24 164 lb (74.4 kg)  01/28/24 167 lb 6.4  oz (75.9 kg)      Versa Gore, NP

## 2024-03-04 NOTE — Assessment & Plan Note (Signed)
 Monitoring required due to potential nephrotoxic effects of Lyrica  and Uloric . - Recheck kidney function today. - Consider discontinuing Uloric  if kidney function is affected and no gout episodes have occurred. - F/U in 3-4 mos

## 2024-03-08 ENCOUNTER — Ambulatory Visit: Payer: Self-pay | Admitting: Family

## 2024-03-09 DIAGNOSIS — M1991 Primary osteoarthritis, unspecified site: Secondary | ICD-10-CM | POA: Diagnosis not present

## 2024-03-09 DIAGNOSIS — M1A09X0 Idiopathic chronic gout, multiple sites, without tophus (tophi): Secondary | ICD-10-CM | POA: Diagnosis not present

## 2024-03-09 DIAGNOSIS — M0609 Rheumatoid arthritis without rheumatoid factor, multiple sites: Secondary | ICD-10-CM | POA: Diagnosis not present

## 2024-03-09 DIAGNOSIS — M797 Fibromyalgia: Secondary | ICD-10-CM | POA: Diagnosis not present

## 2024-03-09 DIAGNOSIS — I7300 Raynaud's syndrome without gangrene: Secondary | ICD-10-CM | POA: Diagnosis not present

## 2024-04-25 ENCOUNTER — Other Ambulatory Visit: Payer: Self-pay | Admitting: Family

## 2024-04-25 DIAGNOSIS — M1A0790 Idiopathic chronic gout, unspecified ankle and foot, without tophus (tophi): Secondary | ICD-10-CM

## 2024-04-25 MED ORDER — FEBUXOSTAT 40 MG PO TABS: 40.0000 mg | ORAL_TABLET | Freq: Every day | ORAL | 2 refills | Status: DC

## 2024-04-25 NOTE — Telephone Encounter (Signed)
 Copied from CRM [id number]. Topic: Clinical - Medication Refill >> Apr 25, 2024 11:02 AM Robinson H wrote: Medication: febuxostat  (ULORIC ) 40 MG tablet  Has the patient contacted their pharmacy? No, bottle states no refills (Agent: If no, request that the patient contact the pharmacy for the refill. If patient does not wish to contact the pharmacy document the reason why and proceed with request.) (Agent: If yes, when and what did the pharmacy advise?)  This is the patient's preferred pharmacy:  [facility] - [place], [place] - [address] [place] [address] Phone: [phone] Fax: [phone]  Is this the correct pharmacy for this prescription? Yes If no, delete pharmacy and type the correct one.   Has the prescription been filled recently? No  Is the patient out of the medication? Yes  Has the patient been seen for an appointment in the last year OR does the patient have an upcoming appointment? Yes  Can we respond through MyChart? Yes  Agent: Please be advised that Rx refills may take up to 3 business days. We ask that you follow-up with your pharmacy.

## 2024-05-13 ENCOUNTER — Other Ambulatory Visit: Payer: Self-pay

## 2024-05-13 ENCOUNTER — Emergency Department (HOSPITAL_COMMUNITY)
Admission: EM | Admit: 2024-05-13 | Discharge: 2024-05-14 | Disposition: A | Discharge status: Home / Self Care | Attending: Emergency Medicine | Admitting: Emergency Medicine

## 2024-05-13 ENCOUNTER — Encounter (HOSPITAL_COMMUNITY): Payer: Self-pay | Admitting: Emergency Medicine

## 2024-05-13 ENCOUNTER — Emergency Department (HOSPITAL_COMMUNITY)

## 2024-05-13 DIAGNOSIS — M79605 Pain in left leg: Secondary | ICD-10-CM | POA: Diagnosis not present

## 2024-05-13 DIAGNOSIS — M51379 Other intervertebral disc degeneration, lumbosacral region without mention of lumbar back pain or lower extremity pain: Secondary | ICD-10-CM | POA: Diagnosis not present

## 2024-05-13 DIAGNOSIS — M545 Low back pain, unspecified: Secondary | ICD-10-CM | POA: Diagnosis present

## 2024-05-13 DIAGNOSIS — R109 Unspecified abdominal pain: Secondary | ICD-10-CM | POA: Insufficient documentation

## 2024-05-13 DIAGNOSIS — M5432 Sciatica, left side: Secondary | ICD-10-CM

## 2024-05-13 DIAGNOSIS — M51369 Other intervertebral disc degeneration, lumbar region without mention of lumbar back pain or lower extremity pain: Secondary | ICD-10-CM | POA: Diagnosis not present

## 2024-05-13 DIAGNOSIS — R202 Paresthesia of skin: Secondary | ICD-10-CM | POA: Diagnosis not present

## 2024-05-13 DIAGNOSIS — M25471 Effusion, right ankle: Secondary | ICD-10-CM | POA: Diagnosis not present

## 2024-05-13 DIAGNOSIS — M47816 Spondylosis without myelopathy or radiculopathy, lumbar region: Secondary | ICD-10-CM | POA: Diagnosis not present

## 2024-05-13 DIAGNOSIS — M5442 Lumbago with sciatica, left side: Secondary | ICD-10-CM | POA: Diagnosis not present

## 2024-05-13 DIAGNOSIS — R531 Weakness: Secondary | ICD-10-CM | POA: Diagnosis not present

## 2024-05-13 DIAGNOSIS — R200 Anesthesia of skin: Secondary | ICD-10-CM | POA: Diagnosis not present

## 2024-05-13 DIAGNOSIS — M48061 Spinal stenosis, lumbar region without neurogenic claudication: Secondary | ICD-10-CM | POA: Diagnosis not present

## 2024-05-13 NOTE — ED Provider Triage Note (Signed)
 Emergency Medicine Provider Triage Evaluation Note  Angela Gross , a 73 y.o. female  was evaluated in triage.  Pt complains of back pain with left lower extremity numbness.  Pain began yesterday, describes pain beginning in her left buttocks with radiation around the front of her left groin and down her leg, she describes numbness of the left lower extremity that is persistent and to my toes.  She denies any fall or inciting event.  History of degenerative disc disease but she has never had symptoms like this before.  She has had difficulty ambulating secondary to pain.  Denies fever, bowel/bladder incontinence, IV drug abuse.  Review of Systems  Positive: As above Negative: As above  Physical Exam  BP (!) 139/94 (BP Location: Right Arm)   Pulse 84   Temp 98.1 F (36.7 C) (Oral)   Resp 16   Ht 5' (1.524 m)   Wt 73.5 kg   SpO2 97%   BMI 31.64 kg/m  Gen:   Awake, no distress   Resp:  Normal effort  MSK:   Moves extremities without difficulty  Other:  5/5 strength against resistance of bilateral lower extremities, numbness to left lower extremity without specific dermatomal distribution  Medical Decision Making  Medically screening exam initiated at 5:38 PM.  Appropriate orders placed.  Angela Gross was informed that the remainder of the evaluation will be completed by another provider, this initial triage assessment does not replace that evaluation, and the importance of remaining in the ED until their evaluation is complete.     Angela Gross, [place] 05/13/24 1740

## 2024-05-13 NOTE — ED Triage Notes (Signed)
 Pt to ED c/o lower left back pain that radiates down left leg. Denies urinary symptoms, denies hx sciatica.

## 2024-05-14 ENCOUNTER — Emergency Department (HOSPITAL_COMMUNITY)

## 2024-05-14 ENCOUNTER — Ambulatory Visit (HOSPITAL_BASED_OUTPATIENT_CLINIC_OR_DEPARTMENT_OTHER)
Admission: RE | Admit: 2024-05-14 | Discharge: 2024-05-14 | Disposition: A | Discharge status: Home / Self Care | Source: Ambulatory Visit | Attending: Emergency Medicine | Admitting: Emergency Medicine

## 2024-05-14 DIAGNOSIS — R531 Weakness: Secondary | ICD-10-CM | POA: Insufficient documentation

## 2024-05-14 DIAGNOSIS — M5106 Intervertebral disc disorders with myelopathy, lumbar region: Secondary | ICD-10-CM | POA: Diagnosis not present

## 2024-05-14 DIAGNOSIS — R202 Paresthesia of skin: Secondary | ICD-10-CM | POA: Insufficient documentation

## 2024-05-14 DIAGNOSIS — M25471 Effusion, right ankle: Secondary | ICD-10-CM | POA: Insufficient documentation

## 2024-05-14 DIAGNOSIS — M7989 Other specified soft tissue disorders: Secondary | ICD-10-CM | POA: Diagnosis not present

## 2024-05-14 DIAGNOSIS — R109 Unspecified abdominal pain: Secondary | ICD-10-CM | POA: Diagnosis not present

## 2024-05-14 DIAGNOSIS — M79605 Pain in left leg: Secondary | ICD-10-CM | POA: Diagnosis not present

## 2024-05-14 DIAGNOSIS — R932 Abnormal findings on diagnostic imaging of liver and biliary tract: Secondary | ICD-10-CM | POA: Diagnosis not present

## 2024-05-14 DIAGNOSIS — R200 Anesthesia of skin: Secondary | ICD-10-CM | POA: Diagnosis not present

## 2024-05-14 LAB — COMPREHENSIVE METABOLIC PANEL WITH GFR
ALT: 33 U/L (ref 0–44)
AST: 26 U/L (ref 15–41)
Albumin: 4.5 g/dL (ref 3.5–5.0)
Alkaline Phosphatase: 44 U/L (ref 38–126)
Anion gap: 11 (ref 5–15)
BUN: 29 mg/dL — ABNORMAL HIGH (ref 8–23)
CO2: 17 mmol/L — ABNORMAL LOW (ref 22–32)
Calcium: 9.6 mg/dL (ref 8.9–10.3)
Chloride: 110 mmol/L (ref 98–111)
Creatinine, Ser: 1.5 mg/dL — ABNORMAL HIGH (ref 0.44–1.00)
GFR, Estimated: 37 mL/min — ABNORMAL LOW (ref >60)
Glucose, Bld: 103 mg/dL — ABNORMAL HIGH (ref 70–99)
Potassium: 4 mmol/L (ref 3.5–5.1)
Sodium: 138 mmol/L (ref 135–145)
Total Bilirubin: 0.7 mg/dL (ref 0.0–1.2)
Total Protein: 7.5 g/dL (ref 6.5–8.1)

## 2024-05-14 LAB — CBC WITH DIFFERENTIAL/PLATELET
Abs Immature Granulocytes: 0.18 K/uL — ABNORMAL HIGH (ref 0.00–0.07)
Basophils Absolute: 0 K/uL (ref 0.0–0.1)
Basophils Relative: 0 %
Eosinophils Absolute: 0 K/uL (ref 0.0–0.5)
Eosinophils Relative: 0 %
HCT: 44.3 % (ref 36.0–46.0)
Hemoglobin: 14.6 g/dL (ref 12.0–15.0)
Immature Granulocytes: 2 %
Lymphocytes Relative: 16 %
Lymphs Abs: 1.8 K/uL (ref 0.7–4.0)
MCH: 30.7 pg (ref 26.0–34.0)
MCHC: 33 g/dL (ref 30.0–36.0)
MCV: 93.3 fL (ref 80.0–100.0)
Monocytes Absolute: 1.2 K/uL — ABNORMAL HIGH (ref 0.1–1.0)
Monocytes Relative: 10 %
Neutro Abs: 8.4 K/uL — ABNORMAL HIGH (ref 1.7–7.7)
Neutrophils Relative %: 72 %
Platelets: 179 K/uL (ref 150–400)
RBC: 4.75 MIL/uL (ref 3.87–5.11)
RDW: 17 % — ABNORMAL HIGH (ref 11.5–15.5)
WBC: 11.6 K/uL — ABNORMAL HIGH (ref 4.0–10.5)
nRBC: 0.5 % — ABNORMAL HIGH (ref 0.0–0.2)

## 2024-05-14 LAB — URINALYSIS, ROUTINE W REFLEX MICROSCOPIC
Bilirubin Urine: NEGATIVE
Glucose, UA: NEGATIVE mg/dL
Hgb urine dipstick: NEGATIVE
Ketones, ur: NEGATIVE mg/dL
Leukocytes,Ua: NEGATIVE
Nitrite: NEGATIVE
Protein, ur: NEGATIVE mg/dL
Specific Gravity, Urine: 1.039 — ABNORMAL HIGH (ref 1.005–1.030)
pH: 5 (ref 5.0–8.0)

## 2024-05-14 LAB — LIPASE, BLOOD: Lipase: 35 U/L (ref 11–51)

## 2024-05-14 MED ORDER — IOHEXOL 350 MG/ML SOLN
75.0000 mL | Freq: Once | INTRAVENOUS | Status: AC | PRN
  Administered 2024-05-14: 75 mL via INTRAVENOUS

## 2024-05-14 MED ORDER — METHYLPREDNISOLONE 4 MG PO TBPK
ORAL_TABLET | ORAL | 0 refills | Status: DC
Start: 2024-05-14 — End: 2024-05-20

## 2024-05-14 MED ORDER — OXYCODONE-ACETAMINOPHEN 5-325 MG PO TABS
1.0000 | ORAL_TABLET | Freq: Once | ORAL | Status: AC
  Administered 2024-05-14: 1 via ORAL
  Filled 2024-05-14: qty 1

## 2024-05-14 MED ORDER — METHOCARBAMOL 500 MG PO TABS: 500.0000 mg | ORAL_TABLET | Freq: Three times a day (TID) | ORAL | 0 refills | Status: DC | PRN

## 2024-05-14 MED ORDER — DEXAMETHASONE SODIUM PHOSPHATE 10 MG/ML IJ SOLN
10.0000 mg | Freq: Once | INTRAMUSCULAR | Status: AC
  Administered 2024-05-14: 10 mg via INTRAVENOUS
  Filled 2024-05-14: qty 1

## 2024-05-14 NOTE — Discharge Instructions (Addendum)
 Take the steroids and muscle relaxers as prescribed.  Follow-up with your primary doctor as well as the spine specialist.  Return to the ED for worsening pain, weakness, numbness, tingling, bowel or bladder incontinence or other concerns.  Return tomorrow to have an ultrasound of your lower legs to make sure there are no blood clots.

## 2024-05-14 NOTE — ED Provider Notes (Signed)
  EMERGENCY DEPARTMENT AT [facility] Provider Note   CSN: [id number] Arrival date & time: 05/13/24  1645     Patient presents with: Back Pain and Leg Pain (left)   Cierria Height is a 73 y.o. female.   Patient with a history of breast cancer not on active chemotherapy, rheumatoid arthritis, fibromyalgia, osteoporosis presents with left-sided low back pain and groin pain.  Denies any fall or trauma.  Pain ongoing for the past 2 days.  Starts in her left buttock and groin area and radiates down her left leg associated with numbness and tingling and weakness to her left leg.  Left leg feels different than her right.  Some weakness to her left leg.  Denies any pain with urination or blood in the urine.  No fever.  No vomiting.  No chest pain or shortness of breath.  Does have a history of degenerative disc disease but no previous back surgery.  She has had no bowel or bladder incontinence.  No history of IV drug abuse.  No history of sciatica previously.  Taking Tylenol  at home with partial relief.  The history is provided by the patient.  Back Pain Associated symptoms: leg pain   Associated symptoms: no dysuria, no fever and no headaches   Leg Pain Associated symptoms: back pain   Associated symptoms: no fever        Prior to Admission medications   Medication Sig Start Date End Date Taking? Authorizing Provider  ACCU-CHEK GUIDE test strip USE TO TEST BLOOD SUGARS ONCE WEEKLY 12/29/22   [provider]  atorvastatin  (LIPITOR) 20 MG tablet Take 1 tablet (20 mg total) by mouth daily. 12/04/23   Lucius Krabbe, NP  B Complex-C (B-COMPLEX WITH VITAMIN C ) tablet Take 1 tablet by mouth daily.    [provider]  colchicine  0.6 MG tablet Take 1 tablet (0.6 mg total) by mouth daily as needed (For Gout pain. Take for 5 days only.). 04/27/23   Lucius Krabbe, NP  febuxostat  (ULORIC ) 40 MG tablet Take 1 tablet (40 mg total) by mouth daily. 04/25/24    Lucius Krabbe, NP  fluticasone (FLONASE) 50 MCG/ACT nasal spray Place into both nostrils. 12/29/22   [provider]  furosemide (LASIX) 20 MG tablet Take 20 mg by mouth daily as needed for fluid. 12/29/22   [provider]  hydroxychloroquine (PLAQUENIL) 200 MG tablet 1 tab Orally twice a day for 30 days 06/12/23   [provider]  losartan  (COZAAR ) 25 MG tablet Take 1 tablet (25 mg total) by mouth as directed. Take 2 tabs in the morning and 1 tab in the evening. 01/28/24   Lucius Krabbe, NP  Olopatadine  HCl 0.2 % SOLN Apply 1 drop to eye daily. 03/16/23   Tabori, Katherine E, MD  potassium chloride  SA (KLOR-CON  M) 20 MEQ tablet Take 1 tablet (20 mEq total) by mouth as directed. Take each day you take a Lasix pill. 09/15/23   Lucius Krabbe, NP  pregabalin  (LYRICA ) 75 MG capsule Take 1 capsule (75 mg total) by mouth 2 (two) times daily. 01/28/24   Lucius Krabbe, NP  Upadacitinib ER (RINVOQ) 15 MG TB24 Take by mouth. 01/09/21   [provider]  Vitamin D , Cholecalciferol , 25 MCG (1000 UT) CAPS Take 25 mcg by mouth daily. 09/16/23   Lucius Krabbe, NP    Allergies: Codeine and Diazepam    Review of Systems  Constitutional:  Negative for activity change, appetite change and fever.  HENT:  Negative for congestion and rhinorrhea.   Respiratory:  Negative for cough, chest tightness and shortness of breath.   Cardiovascular:  Positive for leg swelling.  Gastrointestinal:  Negative for nausea and vomiting.  Genitourinary:  Negative for dysuria and hematuria.  Musculoskeletal:  Positive for back pain and joint swelling.  Neurological:  Negative for headaches.   all other systems are negative except as noted in the HPI and PMH.    Updated Vital Signs BP (!) 161/98 (BP Location: Right Arm)   Pulse 67   Temp 97.6 F (36.4 C) (Oral)   Resp 15   Ht 5' (1.524 m)   Wt 73.5 kg   SpO2 98%   BMI 31.64 kg/m   Physical Exam Vitals and nursing note  reviewed.  Constitutional:      General: She is not in acute distress.    Appearance: She is well-developed. She is not ill-appearing.  HENT:     Head: Normocephalic and atraumatic.     Mouth/Throat:     Pharynx: No oropharyngeal exudate.  Eyes:     Conjunctiva/sclera: Conjunctivae normal.     Pupils: Pupils are equal, round, and reactive to light.  Neck:     Comments: No meningismus. Cardiovascular:     Rate and Rhythm: Normal rate and regular rhythm.     Heart sounds: Normal heart sounds. No murmur heard. Pulmonary:     Effort: Pulmonary effort is normal. No respiratory distress.     Breath sounds: Normal breath sounds.  Chest:     Chest wall: No tenderness.  Abdominal:     Palpations: Abdomen is soft.     Tenderness: There is no abdominal tenderness. There is no guarding or rebound.  Musculoskeletal:        General: Tenderness present. Normal range of motion.     Cervical back: Normal range of motion and neck supple.     Right lower leg: Edema present.     Comments: Left lumbar spinal tenderness, no midline tenderness  5/5 strength in RLE, 4/5 strength on L. Ankle plantar and dorsiflexion intact. Great toe extension intact bilaterally. +2 DP and PT pulses. +2 patellar reflexes on R, unable to elicit on L. Normal gait.  Right ankle and foot swelling greater than left.  .  Subjective decrease sensation of left leg compared to right  Skin:    General: Skin is warm.  Neurological:     Mental Status: She is alert and oriented to person, place, and time.     Cranial Nerves: No cranial nerve deficit.     Motor: No abnormal muscle tone.     Coordination: Coordination normal.     Comments:  5/5 strength throughout. CN 2-12 intact.Equal grip strength.   Psychiatric:        Behavior: Behavior normal.     (all labs ordered are listed, but only abnormal results are displayed) Labs Reviewed  CBC WITH DIFFERENTIAL/PLATELET - Abnormal; Notable for the following components:       Result Value   WBC 11.6 (*)    RDW 17.0 (*)    nRBC 0.5 (*)    Neutro Abs 8.4 (*)    Monocytes Absolute 1.2 (*)    Abs Immature Granulocytes 0.18 (*)    All other components within normal limits  COMPREHENSIVE METABOLIC PANEL WITH GFR - Abnormal; Notable for the following components:   CO2 17 (*)    Glucose, Bld 103 (*)    BUN 29 (*)    Creatinine,  Ser 1.50 (*)    GFR, Estimated 37 (*)    All other components within normal limits  URINALYSIS, ROUTINE W REFLEX MICROSCOPIC - Abnormal; Notable for the following components:   Color, Urine STRAW (*)    Specific Gravity, Urine 1.039 (*)    All other components within normal limits  LIPASE, BLOOD    EKG: None  Radiology: CT ABDOMEN PELVIS W CONTRAST Result Date: 05/14/2024 EXAM: CT ABDOMEN AND PELVIS WITH CONTRAST 05/14/2024 02:42:00 AM TECHNIQUE: CT of the abdomen and pelvis was performed with the administration of intravenous contrast. Multiplanar reformatted images are provided for review. Automated exposure control, iterative reconstruction, and/or weight based adjustment of the mA/kV was utilized to reduce the radiation dose to as low as reasonably achievable. COMPARISON: None available. CLINICAL HISTORY: Abdominal pain, acute, nonlocalized. Pt to ED c/o lower left back pain that radiates down left leg. FINDINGS: LOWER CHEST: No acute abnormality. LIVER: The liver is unremarkable. GALLBLADDER AND BILE DUCTS: Cholecystectomy. No biliary ductal dilatation. SPLEEN: 1.7 cm lobular hypoattenuating lesion in the spleen likely a benign hemangioma. PANCREAS: No acute abnormality. ADRENAL GLANDS: No acute abnormality. KIDNEYS, URETERS AND BLADDER: Cortical renal scarring both kidneys. No stones in the kidneys or ureters. No hydronephrosis. No perinephric or periureteral stranding. Urinary bladder is unremarkable. GI AND BOWEL: Normal appendix. Stomach demonstrates no acute abnormality. There is no bowel obstruction. No bowel wall thickening.  PERITONEUM AND RETROPERITONEUM: No ascites. No free air. VASCULATURE: Aorta is normal in caliber. LYMPH NODES: No lymphadenopathy. REPRODUCTIVE ORGANS: No acute abnormality. BONES AND SOFT TISSUES: No acute osseous abnormality. No focal soft tissue abnormality. IMPRESSION: 1. No acute findings in the abdomen or pelvis. Electronically signed by: Norman Gatlin MD 05/14/2024 02:58 AM EDT RP Workstation: [id number]   MR LUMBAR SPINE WO CONTRAST Result Date: 05/14/2024 EXAM: MRI LUMBAR SPINE 05/14/2024 02:29:40 AM TECHNIQUE: Multiplanar multisequence MRI of the lumbar spine was performed without the administration of intravenous contrast. COMPARISON: 01/11/2015 CLINICAL HISTORY: Myelopathy, acute, lumbar spine; LLE Pain and numbness. AOX4, room air, no drips per nurse. FINDINGS: BONES AND ALIGNMENT: Normal alignment. Normal vertebral body heights. Bone marrow signal is unremarkable. SPINAL CORD: The conus terminates normally. SOFT TISSUES: No paraspinal mass. L1-L2: No significant disc herniation. No spinal canal stenosis or neural foraminal narrowing. L2-L3: No significant disc herniation. No spinal canal stenosis or neural foraminal narrowing. L3-L4: Small disc bulge and moderate facet hypertrophy with increased widening and fluid in the facets. No spinal canal stenosis. L4-L5: Mild facet hypertrophy without stenosis. No significant disc herniation. L5-S1: No significant disc herniation. No spinal canal stenosis or neural foraminal narrowing. IMPRESSION: 1. L3-4 small disc bulge and moderate facet hypertrophy with increased widening and fluid in the facets. 2. L4-5 mild facet hypertrophy without stenosis. Electronically signed by: Franky Stanford MD 05/14/2024 02:43 AM EDT RP Workstation: [id number]   CT Lumbar Spine Wo Contrast Result Date: 05/13/2024 EXAM: CT OF THE LUMBAR SPINE WITHOUT CONTRAST 05/13/2024 06:42:20 PM TECHNIQUE: CT of the lumbar spine was performed without the administration of intravenous  contrast. Multiplanar reformatted images are provided for review. Automated exposure control, iterative reconstruction, and/or weight based adjustment of the mA/kV was utilized to reduce the radiation dose to as low as reasonably achievable. COMPARISON: Plain radiographs of the lumbar spine dated 09/21/2003 and MRI of the lumbar spine dated 01/10/2005. CLINICAL HISTORY: New numbness of LLE. FINDINGS: BONES AND ALIGNMENT: Normal vertebral body heights. No acute fracture or suspicious bone lesion. Normal alignment. DEGENERATIVE CHANGES: At L3-4, there is a  broad-based disc bulge causing mild central spinal canal stenosis and bilateral lateral recess stenosis. At L4-5, there is diffuse disc bulging and there is moderate bilateral facet hypertrophy, with mild-to-moderate central spinal canal stenosis and bilateral lateral recess stenosis. No apparent nerve root impingement. At L5-S1, there is mild disc bulging, but no significant spinal canal or neural foraminal stenosis. There is a primordial disc space at L5-S1. There is also a left-sided pseudarthrosis at S1-2 SOFT TISSUES: No acute abnormality. IMPRESSION: 1. L3-4: Broad-based disc bulge causing mild central spinal canal stenosis and bilateral lateral recess stenosis. 2. L4-5: Diffuse disc bulging and moderate bilateral facet hypertrophy, with mild-to-moderate central spinal canal stenosis and bilateral lateral recess stenosis. No apparent nerve root impingement. 3. L5-S1: Mild disc bulging without significant spinal canal or neural foraminal stenosis. 4. Primordial disc space and left-sided pseudarthrosis at S1-2. Electronically signed by: evalene coho 05/13/2024 07:02 PM EDT RP Workstation: [id number]     Procedures   Medications Ordered in the ED  dexamethasone  (DECADRON ) injection 10 mg (has no administration in time range)  oxyCODONE -acetaminophen  (PERCOCET/ROXICET) 5-325 MG per tablet 1 tablet (has no administration in time range)                                     Medical Decision Making Amount and/or Complexity of Data Reviewed Labs: ordered. Decision-making details documented in ED Course. Radiology: ordered and independent interpretation performed. Decision-making details documented in ED Course. ECG/medicine tests: ordered and independent interpretation performed. Decision-making details documented in ED Course.  Risk Prescription drug management.   2 days of left low back pain and leg pain with concern for sciatica.  Intact distal strength, pulses.  Unable to elicit patellar reflex on the left.  Decreased sensation of the left leg compared to right.  Intact distal pulses.  Low concern for acute limb ischemia.  Concern for sciatica.  Unable to elicit patellar reflex on the left.  Decreased strength and sensation to the left leg.  CT scan in triage Shows several areas of bulging disc and foraminal stenosis   MRI shows. IMPRESSION: 1. L3-4 small disc bulge and moderate facet hypertrophy with increased widening and fluid in the facets. 2. L4-5 mild facet hypertrophy without stenosis.  Results discussed with Dr. Shona of radiology as Dr. Alexa has left for the night.  Small amount of facet widening and fluid in the facets does not suggest infection but does suggest degenerative findings.  No evidence of compression or cauda equina  Patient tolerating p.o. and ambulatory.  Will treat empirically for suspected sciatica with steroids and muscle relaxers. She is not diabetic.  Her right ankle is swollen but she states this is always the case.  Will arrange outpatient Dopplers tomorrow.  Return to the ED with worsening pain, weakness, numbness, tingling, bowel or bladder incontinence or other concerns.  Follow-up with your PCP as well as spine surgeon.     Final diagnoses:  Sciatica of left side    ED Discharge Orders     None          Holliday Sheaffer, Garnette, MD 05/14/24 0425

## 2024-05-14 NOTE — ED Notes (Signed)
Pt was able to ambulate to bathroom with no assistance.

## 2024-05-14 NOTE — Progress Notes (Signed)
 VASCULAR LAB    Bilateral lower extremity venous duplex has been performed.  See CV proc for preliminary results.   Takita Riecke, RVT 05/14/2024, 1:02 PM

## 2024-05-14 NOTE — ED Notes (Signed)
Pt ambulated to the bathroom. Tolerated well.

## 2024-05-20 ENCOUNTER — Encounter: Payer: Self-pay | Admitting: Family

## 2024-05-20 ENCOUNTER — Ambulatory Visit: Admitting: Family

## 2024-05-20 VITALS — BP 124/86 | HR 80 | Temp 97.9°F | Ht 60.0 in | Wt 162.0 lb

## 2024-05-20 DIAGNOSIS — M5416 Radiculopathy, lumbar region: Secondary | ICD-10-CM | POA: Diagnosis not present

## 2024-05-20 MED ORDER — PREDNISONE 10 MG PO TABS: ORAL_TABLET | ORAL | 0 refills | Status: DC

## 2024-05-20 MED ORDER — TIZANIDINE HCL 4 MG PO TABS: 4.0000 mg | ORAL_TABLET | Freq: Four times a day (QID) | ORAL | 0 refills | Status: DC | PRN

## 2024-05-20 NOTE — Progress Notes (Signed)
 Patient ID: Angela Gross, female    DOB: 24-May-1951, 73 y.o.   MRN: [id number]  Chief Complaint  Patient presents with   Sciatica of left side    Pt was seen in ED on 8/1 for Lower back, left hip and left leg pain. Pt is not able to bear weight on left side. Pt has completed course of methylprednisolone  and robaxin    Discussed the use of AI scribe software for clinical note transcription with the patient, who gave verbal consent to proceed.  History of Present Illness Angela Gross is a 73 year old female with rheumatoid arthritis who presents with numbness and tingling in the left leg.  Left lower extremity neuropathy - Numbness and tingling in the left leg, progressing to 'pins and needles' and burning pain from heel to ankle - Seen in ER overnight, given IV steroid, unable to walk but a few feet, sitting in wc during visit - Symptoms impair ambulation, requiring use of a cane for ambulation and wc  - Imaging w/mild bulging disc in L3-4  - No recent injections in the hip - Persistent numbness, tingling, and burning pain despite interventions  Imaging and diagnostic evaluation - Emergency room imaging reveals a mild disc bulge - CT scan negative for blood clots  Pharmacologic management and response - Currently taking Lyrica  without symptomatic improvement - Recently completed courses of prednisone  and Robaxin , which provided only temporary relief  Rheumatoid arthritis management - Remicade previously effective for rheumatoid arthritis, discontinued due to change in healthcare provider  Assessment & Plan Lumbosacral radiculopathy with left leg symptoms Symptoms consistent with lumbosacral radiculopathy, likely due to mild disc bulge. Previous treatments provided minimal relief. Conservative management preferred over surgery. - Refer to [facility] for further evaluation and management. - Prescribe tizanidine  4mg  q6h prn for muscle relaxation, cautioning  about potential drowsiness. - Prescribe prednisone  taper pack to be taken in the morning with breakfast. - Advise to keep the appointment with Doctor Jeannetta on September 23rd for further evaluation.  Rheumatoid arthritis Diagnosed with rheumatoid arthritis, previously managed with Remicade. Considering resuming therapy. - Discuss with Dr Jeannetta at Sept appt the possibility of resuming Remicade therapy.   Subjective:    Outpatient Medications Prior to Visit  Medication Sig Dispense Refill   fluticasone (FLONASE) 50 MCG/ACT nasal spray Place into both nostrils.     hydroxychloroquine (PLAQUENIL) 200 MG tablet 1 tab Orally twice a day for 30 days     methylPREDNISolone  (MEDROL  DOSEPAK) 4 MG TBPK tablet As directed 21 each 0   Upadacitinib ER (RINVOQ) 15 MG TB24 Take by mouth.     ACCU-CHEK GUIDE test strip USE TO TEST BLOOD SUGARS ONCE WEEKLY     atorvastatin  (LIPITOR) 20 MG tablet Take 1 tablet (20 mg total) by mouth daily. 90 tablet 1   B Complex-C (B-COMPLEX WITH VITAMIN C ) tablet Take 1 tablet by mouth daily.     colchicine  0.6 MG tablet Take 1 tablet (0.6 mg total) by mouth daily as needed (For Gout pain. Take for 5 days only.). 30 tablet 0   febuxostat  (ULORIC ) 40 MG tablet Take 1 tablet (40 mg total) by mouth daily. 30 tablet 2   furosemide (LASIX) 20 MG tablet Take 20 mg by mouth daily as needed for fluid.     losartan  (COZAAR ) 25 MG tablet Take 1 tablet (25 mg total) by mouth as directed. Take 2 tabs in the morning and 1 tab in the evening. 270 tablet 1  methocarbamol  (ROBAXIN ) 500 MG tablet Take 1 tablet (500 mg total) by mouth every 8 (eight) hours as needed for muscle spasms. 20 tablet 0   Olopatadine  HCl 0.2 % SOLN Apply 1 drop to eye daily. 2.5 mL 3   potassium chloride  SA (KLOR-CON  M) 20 MEQ tablet Take 1 tablet (20 mEq total) by mouth as directed. Take each day you take a Lasix pill.     pregabalin  (LYRICA ) 75 MG capsule Take 1 capsule (75 mg total) by mouth 2 (two) times daily.  60 capsule 3   Vitamin D , Cholecalciferol , 25 MCG (1000 UT) CAPS Take 25 mcg by mouth daily.     No facility-administered medications prior to visit.   Past Medical History:  Diagnosis Date   Anxiety    Breast cancer (HCC)    right (01/10/2014)   Chronic left shoulder pain    work related injury (01/10/2014)   Constipation 10/04/2014   Degenerative disc disease, cervical    Elevated blood pressure reading without diagnosis of hypertension 09/15/2023   Fibromyalgia    Full dentures    History of breast cancer in female 10/25/2013   Hot flashes, menopausal 04/04/2014   Hypertension    Osteoarthritis of both knees    Osteoporosis    Personal history of chemotherapy    PONV (postoperative nausea and vomiting)    Raynaud disease    Rheumatoid arthritis (HCC)    elbows; fingers; toes   Syncope and collapse    4 times in the last year (01/10/2014)   Wears glasses    Past Surgical History:  Procedure Laterality Date   BREAST BIOPSY Right 10/2013   BREAST BIOPSY Left 07/22/2023   MM LT BREAST BX W LOC DEV 1ST LESION IMAGE BX SPEC STEREO GUIDE 07/22/2023 [facility]   BREAST BIOPSY Left 07/22/2023   MM LT BREAST BX W LOC DEV EA AD LESION IMG BX SPEC STEREO GUIDE 07/22/2023 [facility]   BREAST IMPLANT EXCHANGE Right 02/12/2016   Procedure: EXCHANGE SILICONE IMPLANT ON RIGHT, CAPSULORRAPHY AND PLACEMENT OF ALLODERM;  Surgeon: Earlis Ranks, MD;  Location: [facility];  Service: Plastics;  Laterality: Right;   BREAST RECONSTRUCTION Right 12/12/2014   Procedure: REVISION OF RIGHT RECONSTRUCTIVE BREAST WITH CAPSULORRHAPHY PLACEMENT OF SILCONE IMPLANTS AND LIPOFILLING TO RIGHT CHEST ;  Surgeon: Earlis Ranks, MD;  Location: [facility];  Service: Plastics;  Laterality: Right;   BREAST RECONSTRUCTION Right 03/06/2015   Procedure: RIGHT NIPPLE AEROLA COMPLEX RECONSTRUCTION AND LOCAL FLAP WITH LIPO FILLING TO RIGHT BREAST,REMOVAL RIGHT CHEST PORT;   Surgeon: Earlis Ranks, MD;  Location: [facility];  Service: Plastics;  Laterality: Right;   BREAST RECONSTRUCTION Right 05/18/2018   Procedure: revision right breast reconstruction with silicone implant exchange;  Surgeon: Ranks Earlis, MD;  Location: [facility];  Service: Plastics;  Laterality: Right;   BREAST RECONSTRUCTION WITH PLACEMENT OF TISSUE EXPANDER AND FLEX HD (ACELLULAR HYDRATED DERMIS) Right 12/13/2013   Procedure: RIGHT BREAST RECONSTRUCTION WITH PLACEMENT OF TISSUE EXPANDER AND FLEX HD (ACELLULAR HYDRATED DERMIS);  Surgeon: Earlis Ranks, MD;  Location: [facility];  Service: Plastics;  Laterality: Right;   CHOLECYSTECTOMY  1970's   COLONOSCOPY     LIPOMA EXCISION Bilateral 12/13/2013   Procedure: EXCISION OF LEFT BREAST KELOID AND RIGHT CHEST KELOID;  Surgeon: Earlis Ranks, MD;  Location: [facility];  Service: Plastics;  Laterality: Bilateral;   LIPOSUCTION WITH LIPOFILLING Right 09/26/2014   Procedure: LIPOSUCTION WITH LIPOFILLING;  Surgeon: Earlis Ranks, MD;  Location: [facility];  Service: Plastics;  Laterality: Right;   LIPOSUCTION WITH LIPOFILLING Right 12/12/2014   Procedure: LIPOSUCTION WITH LIPOFILLING;  Surgeon: Earlis Ranks, MD;  Location: [facility];  Service: Plastics;  Laterality: Right;   LIPOSUCTION WITH LIPOFILLING Right 03/06/2015   Procedure: ABDOMINAL LIPOSUCTION WITH LIPOFILLING TO RIGHT BREAST;  Surgeon: Earlis Ranks, MD;  Location: [facility];  Service: Plastics;  Laterality: Right;   MASTECTOMY Right 2015   MASTECTOMY W/ SENTINEL NODE BIOPSY Right 12/13/2013   Procedure: RIGHT TOTAL MASTECTOMY WITH SENTINEL LYMPH NODE BIOPSY;  Surgeon: Elon CHRISTELLA Pacini, MD;  Location: [facility];  Service: General;  Laterality: Right;   MASTOPEXY Left 09/26/2014   Procedure: MASTOPEXY;  Surgeon: Earlis Ranks, MD;  Location: [facility];  Service: Plastics;  Laterality: Left;   PORT-A-CATH REMOVAL Right 03/06/2015   Procedure: REMOVAL PORT-A-CATH;  Surgeon: Earlis Ranks, MD;  Location:  SURGERY CENTER;  Service: Plastics;  Laterality: Right;   PORTACATH PLACEMENT Right 12/13/2013   Procedure: INSERTION PORT-A-CATH;  Surgeon: Elon CHRISTELLA Pacini, MD;  Location:  SURGERY CENTER;  Service: General;  Laterality: Right;   REDUCTION MAMMAPLASTY Left 2015   REMOVAL OF BILATERAL TISSUE EXPANDERS WITH PLACEMENT OF BILATERAL BREAST IMPLANTS Bilateral 09/26/2014   Procedure: REMOVAL OF RIGHT TISSUE EXPANDER WITH PLACEMENT OF RIGHT BREAST IMPLANT LIPOFILLING TO RIGHT BREAST/LEFT BREAST MASTOPEXY FOR SYMMETRY;  Surgeon: Earlis Ranks, MD;  Location:  SURGERY CENTER;  Service: Plastics;  Laterality: Bilateral;   TISSUE EXPANDER PLACEMENT Right 01/10/2014   Procedure: Incision and Drainage Right Breast Seroma with TISSUE EXPANDER exchange;  Surgeon: Earlis Ranks, MD;  Location: [facility];  Service: Plastics;  Laterality: Right;   VAGINAL HYSTERECTOMY  1980   no salpingo-oophoretomu   Allergies  Allergen Reactions   Codeine Hives and Other (See Comments)    Altered mental status, numbness    Diazepam Other (See Comments)    Delusions, hallucinations       Objective:    Physical Exam Vitals and nursing note reviewed.  Constitutional:      Appearance: Normal appearance.  Cardiovascular:     Rate and Rhythm: Normal rate and regular rhythm.  Pulmonary:     Effort: Pulmonary effort is normal.     Breath sounds: Normal breath sounds.  Musculoskeletal:     Lumbar back: Decreased range of motion.     Left hip: Decreased range of motion. Decreased strength.  Skin:    General: Skin is warm and dry.  Neurological:     Mental Status: She is alert.  Psychiatric:        Mood and Affect: Mood normal.        Behavior: Behavior normal.    BP 124/86 (BP Location: Left Arm, Patient Position:  Sitting, Cuff Size: Large)   Pulse 80   Temp 97.9 F (36.6 C) (Temporal)   Ht 5' (1.524 m)   Wt 162 lb (73.5 kg)   SpO2 97%   BMI 31.64 kg/m  Wt Readings from Last 3 Encounters:  05/20/24 162 lb (73.5 kg)  05/13/24 162 lb (73.5 kg)  03/04/24 165 lb 8 oz (75.1 kg)      Lucius Krabbe, NP

## 2024-05-27 ENCOUNTER — Other Ambulatory Visit: Payer: Self-pay

## 2024-05-27 DIAGNOSIS — M5416 Radiculopathy, lumbar region: Secondary | ICD-10-CM

## 2024-05-27 DIAGNOSIS — M5126 Other intervertebral disc displacement, lumbar region: Secondary | ICD-10-CM

## 2024-05-28 ENCOUNTER — Other Ambulatory Visit: Payer: Self-pay | Admitting: Family

## 2024-05-28 DIAGNOSIS — G620 Drug-induced polyneuropathy: Secondary | ICD-10-CM

## 2024-05-30 NOTE — Telephone Encounter (Signed)
 Last OV 05/20/24 Next OV 11/14/24  Last refill 01/28/24 Qty #60/3

## 2024-06-17 ENCOUNTER — Telehealth: Payer: Self-pay

## 2024-06-17 DIAGNOSIS — Z79899 Other long term (current) drug therapy: Secondary | ICD-10-CM | POA: Diagnosis not present

## 2024-06-17 DIAGNOSIS — R5383 Other fatigue: Secondary | ICD-10-CM | POA: Diagnosis not present

## 2024-06-17 DIAGNOSIS — M5416 Radiculopathy, lumbar region: Secondary | ICD-10-CM | POA: Diagnosis not present

## 2024-06-17 DIAGNOSIS — M1A09X0 Idiopathic chronic gout, multiple sites, without tophus (tophi): Secondary | ICD-10-CM | POA: Diagnosis not present

## 2024-06-17 DIAGNOSIS — M1991 Primary osteoarthritis, unspecified site: Secondary | ICD-10-CM | POA: Diagnosis not present

## 2024-06-17 DIAGNOSIS — I7300 Raynaud's syndrome without gangrene: Secondary | ICD-10-CM | POA: Diagnosis not present

## 2024-06-17 DIAGNOSIS — M0609 Rheumatoid arthritis without rheumatoid factor, multiple sites: Secondary | ICD-10-CM | POA: Diagnosis not present

## 2024-06-17 DIAGNOSIS — G629 Polyneuropathy, unspecified: Secondary | ICD-10-CM | POA: Diagnosis not present

## 2024-06-17 DIAGNOSIS — M797 Fibromyalgia: Secondary | ICD-10-CM | POA: Diagnosis not present

## 2024-06-17 NOTE — Telephone Encounter (Signed)
 Copied from CRM #[id number]. Topic: Clinical - Prescription Issue >> Jun 17, 2024  1:06 PM Lauren C wrote: Reason for CRM: FYI- Pt states she received a call today saying Gabapentin  was sent in for him, however she called the pharmacy and they said they didn't have anything. I also see the request for gabapentin  is not completed. I let her know that miss Hudnell will be working on that for her and it can take up to 3 business days.

## 2024-06-17 NOTE — Telephone Encounter (Signed)
 Copied from CRM #[id number]. Topic: Clinical - Medication Question >> Jun 17, 2024 11:53 AM Rea ORN wrote: Reason for CRM: Pt is requesting to stop taking pregabalin  (LYRICA ) 75 MG capsule and go back to gabapentin . Pt stated Pregabalin  is not working for her.

## 2024-06-20 ENCOUNTER — Telehealth: Payer: Self-pay

## 2024-06-20 NOTE — Telephone Encounter (Signed)
 I returned pt's call, pt verbalized understanding.    Copied from CRM [id number]. Topic: Clinical - Medication Question >> Jun 20, 2024 12:44 PM Frederich PARAS wrote: Reason for CRM:  pt calling to leave message for stephanie, pt says she would liek to be placed back on gabapentin  300 mg twice a day, Pt does not want the lyrica  to be raised. PT Adv she was on gabapentin  before the lyrica  , per records.  pt call back # [phone]

## 2024-06-20 NOTE — Telephone Encounter (Signed)
 No Gabapentin  because we were trying Lyrica . Unfortunately, due to her poor kidney function, she can't take the Gabapentin  and I can't increase the dose of Lyrica . She needs to discuss her pain with the new rheumatologist appt, it is scheduled for this month? Let me know, thx

## 2024-06-20 NOTE — Telephone Encounter (Signed)
 Addressed in separate message

## 2024-07-05 ENCOUNTER — Encounter: Admitting: Internal Medicine

## 2024-07-18 ENCOUNTER — Other Ambulatory Visit: Payer: Self-pay | Admitting: Family

## 2024-07-18 DIAGNOSIS — Z1231 Encounter for screening mammogram for malignant neoplasm of breast: Secondary | ICD-10-CM

## 2024-07-21 ENCOUNTER — Telehealth: Payer: Self-pay

## 2024-07-21 NOTE — Telephone Encounter (Signed)
 I returned pt's call and left a voicemail, I reminded pt of PCP's previous message and recommendation in September. Pt should be trying Lyrica . Unfortunately, due to her poor kidney function, she can't take the Gabapentin . Pt was suppose to discuss pain options at new rheumatologist appt.     Copied from CRM [id number]. Topic: Clinical - Medication Question >> Jul 21, 2024 12:46 PM Berneda F wrote: Reason for CRM:  Patient would like Corean Comment to know that she called before because she wanted to be put back on gabapentin   and was told to call her rhumatologist because she was the one to initally put her on it, but when she called them, she told her that actually it was Corean is the one who prescribed it and they wont put her back on it. Is this something we can prescribe for her again? She states it was 2X a day.  [facility] - [place], [place] - [address] [place] [address] Phone: [phone] Fax: [phone] Hours: Not open 24 hours  Patient callback is [phone]

## 2024-07-26 ENCOUNTER — Other Ambulatory Visit: Payer: Self-pay | Admitting: Family

## 2024-07-26 ENCOUNTER — Telehealth: Payer: Self-pay | Admitting: Family

## 2024-07-26 DIAGNOSIS — E782 Mixed hyperlipidemia: Secondary | ICD-10-CM

## 2024-07-26 DIAGNOSIS — I10 Essential (primary) hypertension: Secondary | ICD-10-CM

## 2024-07-26 NOTE — Progress Notes (Signed)
 Pharmacy Quality Measure Review  This patient is appearing on a report for being at risk of failing the adherence measure for cholesterol (statin) medications this calendar year.   Medication: Atorvastatin  20mg  Last fill date: 05/31 for 90 day supply  Attempted to call x2, LVM noting this documented attempt to encourage adherence to prescribed atorvastatin . Based on chart review, she is still indicated to receive this and has sufficient refills.  Angela Baalmann, PharmD [facility] Pharmacist

## 2024-07-28 ENCOUNTER — Ambulatory Visit
Admission: RE | Admit: 2024-07-28 | Discharge: 2024-07-28 | Disposition: A | Discharge status: Home / Self Care | Source: Ambulatory Visit | Attending: Family | Admitting: Family

## 2024-07-28 DIAGNOSIS — Z1231 Encounter for screening mammogram for malignant neoplasm of breast: Secondary | ICD-10-CM | POA: Diagnosis not present

## 2024-08-02 ENCOUNTER — Ambulatory Visit: Payer: Self-pay

## 2024-08-02 NOTE — Telephone Encounter (Signed)
 Appt tomorrow

## 2024-08-02 NOTE — Telephone Encounter (Signed)
 FYI Only or Action Required?: FYI only for provider.  Patient was last seen in primary care on 05/20/2024 by Lucius Krabbe, NP.  Called Nurse Triage reporting Numbness.  Symptoms began a week ago.  Interventions attempted: Nothing.  Symptoms are: gradually worsening.  Triage Disposition: See Physician Within 24 Hours  Patient/caregiver understands and will follow disposition?: Yes     Copied from CRM #[id number]. Topic: Clinical - Red Word Triage >> Aug 02, 2024 10:00 AM Nessti S wrote: [facility] that prompted transfer to Nurse Triage: numbing in hands, feet up to knees Reason for Disposition  Numbness (i.e., loss of sensation) in hand or fingers  (Exceptions: Just tingling; numbness present > 2 weeks.)  Answer Assessment - Initial Assessment Questions PT states she has neuropathy and called rheumatology who told her it was a PCP issue. She states that last week the pain has gotten worse. She states that she is having pain and numbness in both hands and feet. They are both all swelling. She has swelling in her left leg headed towards her knee and in her right shoulder. She states she was also having low back pain shooting down her leg and rheumatology told her that was her sciatica nerve. She states hot showers help a little bit.     1. ONSET: When did the pain start?     About a week 2. LOCATION: Where is the pain located?     Both hands, both feet, right shoulder,  3. PAIN: How bad is the pain? (Scale 1-10; or mild, moderate, severe)     10 4. WORK OR EXERCISE: Has there been any recent work or exercise that involved this part (i.e., hand or wrist) of the body?     no 5. CAUSE: What do you think is causing the pain?     neuropathy 6. AGGRAVATING FACTORS: What makes the pain worse? (e.g., using computer)     Moving around 7. OTHER SYMPTOMS: Do you have any other symptoms? (e.g., fever, neck pain, numbness or tingling, rash, swelling)     Numbness and  swelling  Protocols used: Hand Pain-A-AH

## 2024-08-03 ENCOUNTER — Encounter: Payer: Self-pay | Admitting: Family

## 2024-08-03 ENCOUNTER — Ambulatory Visit (INDEPENDENT_AMBULATORY_CARE_PROVIDER_SITE_OTHER): Admitting: Family

## 2024-08-03 VITALS — BP 136/88 | HR 88 | Temp 97.7°F | Ht 60.0 in | Wt 161.2 lb

## 2024-08-03 DIAGNOSIS — M25472 Effusion, left ankle: Secondary | ICD-10-CM

## 2024-08-03 DIAGNOSIS — G629 Polyneuropathy, unspecified: Secondary | ICD-10-CM

## 2024-08-03 DIAGNOSIS — N1832 Chronic kidney disease, stage 3b: Secondary | ICD-10-CM

## 2024-08-03 DIAGNOSIS — M25474 Effusion, right foot: Secondary | ICD-10-CM

## 2024-08-03 DIAGNOSIS — M25471 Effusion, right ankle: Secondary | ICD-10-CM | POA: Diagnosis not present

## 2024-08-03 DIAGNOSIS — M5416 Radiculopathy, lumbar region: Secondary | ICD-10-CM | POA: Diagnosis not present

## 2024-08-03 DIAGNOSIS — M5412 Radiculopathy, cervical region: Secondary | ICD-10-CM | POA: Diagnosis not present

## 2024-08-03 DIAGNOSIS — M25475 Effusion, left foot: Secondary | ICD-10-CM | POA: Diagnosis not present

## 2024-08-03 DIAGNOSIS — R7303 Prediabetes: Secondary | ICD-10-CM

## 2024-08-03 LAB — HEMOGLOBIN A1C: Hgb A1c MFr Bld: 6 % (ref 4.6–6.5)

## 2024-08-03 MED ORDER — AMITRIPTYLINE HCL 25 MG PO TABS
25.0000 mg | ORAL_TABLET | Freq: Every day | ORAL | 2 refills | Status: DC
Start: 2024-08-03 — End: 2024-08-26

## 2024-08-03 MED ORDER — FUROSEMIDE 20 MG PO TABS: 20.0000 mg | ORAL_TABLET | Freq: Every morning | ORAL | 2 refills | Status: AC

## 2024-08-03 MED ORDER — TIZANIDINE HCL 4 MG PO TABS
4.0000 mg | ORAL_TABLET | Freq: Four times a day (QID) | ORAL | 0 refills | Status: DC | PRN
Start: 2024-08-03 — End: 2024-08-05

## 2024-08-03 NOTE — Patient Instructions (Addendum)
 It was very nice to see you today!   Call Dr Virgilio office to follow up on your back pain. [phone]  I have sent a new medication Amitriptyline to take at bedtime. This causes drowsiness but can help with your numbness and tingling symptoms. Schedule a 4 week visit to see me again to see how this is doing.   I also sent in Lasix to take for your swelling, take in the morning, be sure to hydrate well every day as it will make you urinate more. Eat one small banana every day. We will check your labs again at your next visit in 1 month.     PLEASE NOTE:  If you had any lab tests please let us  know if you have not heard back within a few days. You may see your results on MyChart before we have a chance to review them but we will give you a call once they are reviewed by us . If we ordered any referrals today, please let us  know if you have not heard from their office within the next week.

## 2024-08-03 NOTE — Progress Notes (Signed)
 Patient ID: Angela Gross, female    DOB: Jan 21, 1951, 73 y.o.   MRN: [id number]  Chief Complaint  Patient presents with   Numbness    Pt c/o numbness in bilateral feet, shoulders,hands and back. Present for 3 days.   Discussed the use of AI scribe software for clinical note transcription with the patient, who gave verbal consent to proceed.  History of Present Illness Angela Gross is a 73 year old female with rheumatoid arthritis and osteoarthritis who presents with tingling in shoulders, upper back, hands and ankles/feet, and swelling in her feet and hands.  She experiences new onset tingling in her feet and swelling in both ankles. These symptoms were previously managed with diuretics, but she has not been on these medications recently due to low electrolytes. Numbness is present in her hands, feet, and shoulder, along with pain from her lower back to her hips and groin area. She has sciatic nerve issues due to a bulging disc affecting the nerve.  Degenerative disc disease in her neck causes pain radiating down her shoulders when she moves her neck. An orthopedic evaluation indicated her neck is slightly out of place, but surgery was not necessary.  She is currently taking Lyrica , which is not effective. Kidney issues limit her medication options. She maintains hydration and consumes potassium-rich foods like bananas and oranges to manage potential electrolyte imbalances.  Assessment & Plan Polyneuropathy and lumbar radiculopathy Numbness and tingling in extremities with lower back pain likely due to sciatic nerve and bulging disc. Degenerative cervical disc disease may contribute. - Discontinue Lyrica  due to ineffectiveness and kidney concerns. - Prescribe Elavil (amitriptyline) 25mg  at bedtime for neuropathic pain, monitor for morning drowsiness. Will titrate up slowly, necessary for effectiveness. - Contact Dr. Joane for further evaluation and pain management  recommendations.  Degenerative cervical disc disease/Lumbar radiculopathy Cervical spine misalignment with numbness and tingling in shoulders. Degenerative changes expected to progress. - Refer to Dr. Joane for further evaluation and persistent pain management. - Midwife and exercises.  Edema of lower extremities Swelling in ankles, previously managed with HCTZ, discontinued due to low electrolytes and kidney concerns. Last GFR 41 05/2024. - Recheck CMP today. - Prescribe low-dose Lasix (furosemide) 20mg  in the morning, monitor for increased urination. - Advise on importance of hydration and electrolyte monitoring, particularly potassium. - Encourage consumption of bananas and occasional oranges for potassium. - Follow-up in 1 month  Chronic kidney disease -stage 3 Chronic kidney disease limits medication options. Regular monitoring necessary. - Check kidney function and electrolytes with lab test today. - Monitor for adverse effects of new medications on kidney function.  Prediabetes Prediabetes requires monitoring to prevent progression. - Check blood A1c today to monitor prediabetes status.   Subjective:    Outpatient Medications Prior to Visit  Medication Sig Dispense Refill   ACCU-CHEK GUIDE test strip USE TO TEST BLOOD SUGARS ONCE WEEKLY     allopurinol (ZYLOPRIM) 300 MG tablet 1 tablet Orally Once a day; Duration: 90 days     atorvastatin  (LIPITOR) 20 MG tablet Take 1 tablet (20 mg total) by mouth daily. 90 tablet 1   febuxostat  (ULORIC ) 40 MG tablet Take 1 tablet (40 mg total) by mouth daily. 30 tablet 2   hydroxychloroquine (PLAQUENIL) 200 MG tablet 1 tab Orally twice a day for 30 days     losartan  (COZAAR ) 25 MG tablet TAKE 2 TABS IN THE MORNING AND 1 TAB IN THE EVENING. 270 tablet 1   pregabalin  (LYRICA )  75 MG capsule TAKE 1 CAPSULE (75 MG TOTAL) BY MOUTH 2 (TWO) TIMES DAILY. 60 capsule 3   Upadacitinib ER (RINVOQ) 15 MG TB24 Take by mouth.     B  Complex-C (B-COMPLEX WITH VITAMIN C ) tablet Take 1 tablet by mouth daily.     colchicine  0.6 MG tablet Take 1 tablet (0.6 mg total) by mouth daily as needed (For Gout pain. Take for 5 days only.). 30 tablet 0   fluticasone (FLONASE) 50 MCG/ACT nasal spray Place into both nostrils.     furosemide (LASIX) 20 MG tablet Take 20 mg by mouth daily as needed for fluid.     Olopatadine  HCl 0.2 % SOLN Apply 1 drop to eye daily. 2.5 mL 3   potassium chloride  SA (KLOR-CON  M) 20 MEQ tablet Take 1 tablet (20 mEq total) by mouth as directed. Take each day you take a Lasix pill.     predniSONE  (DELTASONE ) 10 MG tablet Take after breakfast:  6 tab day 1, 5 tab day 2-3, 4 tab day 4, 3 tab day 5, 2 tab day 6-7 27 tablet 0   tiZANidine  (ZANAFLEX ) 4 MG tablet Take 1 tablet (4 mg total) by mouth every 6 (six) hours as needed (For Back & leg pain. May cause drowsiness.). STOP Robaxin  (Methocarbamol ) 30 tablet 0   Vitamin D , Cholecalciferol , 25 MCG (1000 UT) CAPS Take 25 mcg by mouth daily.     No facility-administered medications prior to visit.   Past Medical History:  Diagnosis Date   Anxiety    Breast cancer (HCC)    right (01/10/2014)   Chronic left shoulder pain    work related injury (01/10/2014)   Constipation 10/04/2014   Degenerative disc disease, cervical    Elevated blood pressure reading without diagnosis of hypertension 09/15/2023   Fibromyalgia    Full dentures    History of breast cancer in female 10/25/2013   Hot flashes, menopausal 04/04/2014   Hypertension    Osteoarthritis of both knees    Osteoporosis    Personal history of chemotherapy    PONV (postoperative nausea and vomiting)    Raynaud disease    Rheumatoid arthritis (HCC)    elbows; fingers; toes   Syncope and collapse    4 times in the last year (01/10/2014)   Wears glasses    Past Surgical History:  Procedure Laterality Date   BREAST BIOPSY Right 10/2013   BREAST BIOPSY Left 07/22/2023   MM LT BREAST BX W LOC DEV  1ST LESION IMAGE BX SPEC STEREO GUIDE 07/22/2023 [facility]   BREAST BIOPSY Left 07/22/2023   MM LT BREAST BX W LOC DEV EA AD LESION IMG BX SPEC STEREO GUIDE 07/22/2023 [facility]   BREAST IMPLANT EXCHANGE Right 02/12/2016   Procedure: EXCHANGE SILICONE IMPLANT ON RIGHT, CAPSULORRAPHY AND PLACEMENT OF ALLODERM;  Surgeon: Earlis Ranks, MD;  Location: [facility];  Service: Plastics;  Laterality: Right;   BREAST RECONSTRUCTION Right 12/12/2014   Procedure: REVISION OF RIGHT RECONSTRUCTIVE BREAST WITH CAPSULORRHAPHY PLACEMENT OF SILCONE IMPLANTS AND LIPOFILLING TO RIGHT CHEST ;  Surgeon: Earlis Ranks, MD;  Location: [facility];  Service: Plastics;  Laterality: Right;   BREAST RECONSTRUCTION Right 03/06/2015   Procedure: RIGHT NIPPLE AEROLA COMPLEX RECONSTRUCTION AND LOCAL FLAP WITH LIPO FILLING TO RIGHT BREAST,REMOVAL RIGHT CHEST PORT;  Surgeon: Earlis Ranks, MD;  Location: [facility];  Service: Plastics;  Laterality: Right;   BREAST RECONSTRUCTION Right 05/18/2018   Procedure: revision right breast  reconstruction with silicone implant exchange;  Surgeon: Arelia Filippo, MD;  Location: [facility];  Service: Plastics;  Laterality: Right;   BREAST RECONSTRUCTION WITH PLACEMENT OF TISSUE EXPANDER AND FLEX HD (ACELLULAR HYDRATED DERMIS) Right 12/13/2013   Procedure: RIGHT BREAST RECONSTRUCTION WITH PLACEMENT OF TISSUE EXPANDER AND FLEX HD (ACELLULAR HYDRATED DERMIS);  Surgeon: Filippo Arelia, MD;  Location: [facility];  Service: Plastics;  Laterality: Right;   CHOLECYSTECTOMY  1970's   COLONOSCOPY     LIPOMA EXCISION Bilateral 12/13/2013   Procedure: EXCISION OF LEFT BREAST KELOID AND RIGHT CHEST KELOID;  Surgeon: Filippo Arelia, MD;  Location: [facility];  Service: Plastics;  Laterality: Bilateral;   LIPOSUCTION WITH LIPOFILLING Right 09/26/2014   Procedure: LIPOSUCTION WITH LIPOFILLING;   Surgeon: Filippo Arelia, MD;  Location: [facility];  Service: Plastics;  Laterality: Right;   LIPOSUCTION WITH LIPOFILLING Right 12/12/2014   Procedure: LIPOSUCTION WITH LIPOFILLING;  Surgeon: Filippo Arelia, MD;  Location: [facility];  Service: Plastics;  Laterality: Right;   LIPOSUCTION WITH LIPOFILLING Right 03/06/2015   Procedure: ABDOMINAL LIPOSUCTION WITH LIPOFILLING TO RIGHT BREAST;  Surgeon: Filippo Arelia, MD;  Location: [facility];  Service: Plastics;  Laterality: Right;   MASTECTOMY Right 2015   MASTECTOMY W/ SENTINEL NODE BIOPSY Right 12/13/2013   Procedure: RIGHT TOTAL MASTECTOMY WITH SENTINEL LYMPH NODE BIOPSY;  Surgeon: Elon CHRISTELLA Pacini, MD;  Location: [facility];  Service: General;  Laterality: Right;   MASTOPEXY Left 09/26/2014   Procedure: MASTOPEXY;  Surgeon: Filippo Arelia, MD;  Location: [facility];  Service: Plastics;  Laterality: Left;   PORT-A-CATH REMOVAL Right 03/06/2015   Procedure: REMOVAL PORT-A-CATH;  Surgeon: Filippo Arelia, MD;  Location: [facility];  Service: Plastics;  Laterality: Right;   PORTACATH PLACEMENT Right 12/13/2013   Procedure: INSERTION PORT-A-CATH;  Surgeon: Elon CHRISTELLA Pacini, MD;  Location: [facility];  Service: General;  Laterality: Right;   REDUCTION MAMMAPLASTY Left 2015   REMOVAL OF BILATERAL TISSUE EXPANDERS WITH PLACEMENT OF BILATERAL BREAST IMPLANTS Bilateral 09/26/2014   Procedure: REMOVAL OF RIGHT TISSUE EXPANDER WITH PLACEMENT OF RIGHT BREAST IMPLANT LIPOFILLING TO RIGHT BREAST/LEFT BREAST MASTOPEXY FOR SYMMETRY;  Surgeon: Filippo Arelia, MD;  Location: [facility];  Service: Plastics;  Laterality: Bilateral;   TISSUE EXPANDER PLACEMENT Right 01/10/2014   Procedure: Incision and Drainage Right Breast Seroma with TISSUE EXPANDER exchange;  Surgeon: Filippo Arelia, MD;  Location: [facility];  Service: Plastics;  Laterality:  Right;   VAGINAL HYSTERECTOMY  1980   no salpingo-oophoretomu   Allergies  Allergen Reactions   Codeine Hives and Other (See Comments)    Altered mental status, numbness    Diazepam Other (See Comments)    Delusions, hallucinations       Objective:    Physical Exam Vitals and nursing note reviewed.  Constitutional:      Appearance: Normal appearance. She is obese.  Cardiovascular:     Rate and Rhythm: Normal rate and regular rhythm.  Pulmonary:     Effort: Pulmonary effort is normal.     Breath sounds: Normal breath sounds.  Musculoskeletal:        General: Normal range of motion.  Feet:     Right foot:     Protective Sensation: 10 sites tested.  4 sites sensed.     Skin integrity: Skin integrity normal.     Toenail Condition: Right toenails are normal.  Left foot:     Protective Sensation: 10 sites tested.  4 sites sensed.     Skin integrity: Skin integrity normal.     Toenail Condition: Left toenails are normal.  Skin:    General: Skin is warm and dry.  Neurological:     Mental Status: She is alert.  Psychiatric:        Mood and Affect: Mood normal.        Behavior: Behavior normal.    BP 136/88 (BP Location: Left Arm, Patient Position: Sitting, Cuff Size: Large)   Pulse 88   Temp 97.7 F (36.5 C) (Temporal)   Ht 5' (1.524 m)   Wt 161 lb 3.2 oz (73.1 kg)   SpO2 99%   BMI 31.48 kg/m  Wt Readings from Last 3 Encounters:  08/03/24 161 lb 3.2 oz (73.1 kg)  05/20/24 162 lb (73.5 kg)  05/13/24 162 lb (73.5 kg)      Lucius Krabbe, NP

## 2024-08-04 ENCOUNTER — Ambulatory Visit: Payer: Self-pay

## 2024-08-04 ENCOUNTER — Telehealth: Payer: Self-pay

## 2024-08-04 LAB — COMPLETE METABOLIC PANEL WITHOUT GFR
AG Ratio: 2 (calc) (ref 1.0–2.5)
ALT: 13 U/L (ref 6–29)
AST: 19 U/L (ref 10–35)
Albumin: 4.3 g/dL (ref 3.6–5.1)
Alkaline phosphatase (APISO): 58 U/L (ref 37–153)
BUN: 13 mg/dL (ref 7–25)
CO2: 25 mmol/L (ref 20–32)
Calcium: 9.5 mg/dL (ref 8.6–10.4)
Chloride: 107 mmol/L (ref 98–110)
Creat: 0.96 mg/dL (ref 0.60–1.00)
Globulin: 2.1 g/dL (ref 1.9–3.7)
Glucose, Bld: 98 mg/dL (ref 65–99)
Potassium: 4 mmol/L (ref 3.5–5.3)
Sodium: 142 mmol/L (ref 135–146)
Total Bilirubin: 0.4 mg/dL (ref 0.2–1.2)
Total Protein: 6.4 g/dL (ref 6.1–8.1)

## 2024-08-04 NOTE — Telephone Encounter (Signed)
 I returned pt's call and left a voicemail in regards to message below.  Copied from CRM [id number]. Topic: Clinical - Medication Question >> Aug 03, 2024  3:18 PM Angela Gross wrote: Reason for CRM: Patient concerned about medication amitriptyline (ELAVIL) 25 MG tablet. She thought it was going to be a medication to help with fluid

## 2024-08-04 NOTE — Addendum Note (Signed)
 Addended by: Mckinleigh Schuchart on: 08/04/2024 01:57 PM   Modules accepted: Orders

## 2024-08-04 NOTE — Telephone Encounter (Signed)
 FYI Only or Action Required?: Action required by provider: update on patient condition and would like new medication called in for pain. Please call patient at [phone]  Patient was last seen in primary care on 08/03/2024 by Angela Krabbe, NP.  Called Nurse Triage reporting Medication Reaction, Loss of Consciousness, and Fall.  Symptoms began today.  Interventions attempted: Other: EMS activated.  Symptoms are: completely resolved.  Triage Disposition: Go to ED Now (or PCP Triage)  Patient/caregiver understands and will follow disposition?: No, refuses disposition  Copied from CRM #[id number]. Topic: Clinical - Red Word Triage >> Aug 04, 2024 12:24 PM Angela Gross wrote: Red Word that prompted transfer to Nurse Triage: Patient took medication tiZANidine  (ZANAFLEX ) 4 MG tablet. She passed out and fell. EMS was called. They advised her not to take the pain medication anymore because it caused her heart rate to drop. And to notify clinic for another prescription.   Daughter, Angela Gross is on the line and mother with her. Reason for Disposition  [1] Age > 50 years AND [2] now alert and feels fine  Answer Assessment - Initial Assessment Questions Patient was evaluated by EMS.  Patient not transported to ED  Patient does not want to be seen in office for syncope  Patient would like a different medicine for pain.   Patient and daughter cautioned against taking medication on empty stomach.  Also encouraged to eat and drink plenty of fluids.  ER precautions reviewed.  They both verbalize understanding   1. ONSET: How long were you unconscious? (e.g., minutes, seconds) When did it happen?     Patient was feeling dizzy after taking muscle relaxer on empty stomach.  Occurred at 1200 today 2. CONTENT: What happened during the period of unconsciousness? (e.g., seizure activity)      unknown 3. MENTAL STATUS: Alert and oriented now? (e.g., oriented x 3 = name, month, location)       Yes, alert and oriented 4. TRIGGER: What do you think caused the fainting? What were you doing just before you fainted?  (e.g., exercise, sudden standing up, prolonged standing)     Thinks it may be because of tizanidine  taken at 0800 today.  5. RECURRENT SYMPTOM: Have you ever passed out before? If Yes, ask: When was the last time? and What happened that time?      Patient has history of feeling dizzy and passing out on blood pressure medications years ago 6. INJURY: Did you hurt yourself when you fell?      Denies serious injury, but has left wrist and elbow soreness 7. CARDIAC SYMPTOMS: Have you had any of the following symptoms: chest pain, difficulty breathing, palpitations?     denies 8. NEUROLOGIC SYMPTOMS: Have you had any of the following symptoms: headache, numbness, vertigo, weakness?    Appears to daughter a little unsteady when walking 9. GI SYMPTOMS: Have you had any of the following symptoms: abdomen pain, vomiting, diarrhea, blood in stools?     denies 10. OTHER SYMPTOMS: Do you have any other symptoms?       denies 11. PREGNANCY: Is there any chance you are pregnant? When was your last menstrual period?       N/a  Protocols used: Fainting-A-AH

## 2024-08-05 ENCOUNTER — Other Ambulatory Visit: Payer: Self-pay | Admitting: Family

## 2024-08-05 ENCOUNTER — Encounter: Payer: Self-pay | Admitting: Pharmacist

## 2024-08-05 ENCOUNTER — Ambulatory Visit: Payer: Self-pay | Admitting: Family

## 2024-08-05 DIAGNOSIS — M5416 Radiculopathy, lumbar region: Secondary | ICD-10-CM

## 2024-08-05 MED ORDER — PREDNISONE 10 MG PO TABS: ORAL_TABLET | ORAL | 0 refills | Status: DC

## 2024-08-05 NOTE — Telephone Encounter (Signed)
 Ask her when she took the tizanidine ? Yesterday morning. and she has taken this before and it did not cause a reaction correct? Pt states caused Dizziness but not severe.  Also did she take the new pill Amitriptyline the night before she fell? Yes Did she take the Tizanidine  at bedtime with the Amitriptyline? No She did not take any more tizanidine  yesterday or today correct? Correct  And did she take the Amitriptyline last night again? No And how is she feeling today? Pt states she is feeling better.  Thanks!

## 2024-08-05 NOTE — Progress Notes (Signed)
 See other message - liver & kidney fx are good - A1C down to 6.0, good range. Thx

## 2024-08-05 NOTE — Telephone Encounter (Signed)
 Ask her when she took the tizanidine ?  and she has taken this before and it did not cause a reaction correct?  Also did she take the new pill Amitriptyline the night before she fell?  Did she take the Tizanidine  at bedtime with the Amitriptyline? She did not take any more tizanidine  yesterday or today correct?  And did she take the Amitriptyline last night again?  And how is she feeling today? Thanks!

## 2024-08-05 NOTE — Telephone Encounter (Signed)
 Why didn't she take the Amitriptyline? Should try again unless she thinks this contributed to her falling. She can start with 1/2 pill for a few nights if more comfortable. I am hoping this will help her tingling and pain. I have sent in a prednisone  pack to start tomorrow morning to help with the pain.  She needs to let us  know if any side effects from restarting the Amitriptyline. thx

## 2024-08-05 NOTE — Progress Notes (Signed)
 Pharmacy Quality Measure Review  This patient is appearing on a report for being at risk of failing the adherence measure for cholesterol (statin) and hypertension (ACEi/ARB) medications this calendar year.   Medication: atorvastatin   Last fill date: 03/12/2024 for 90 day supply  Medication: losartan   Last fill date: 03/12/2024 for 90 day supply  Reviewed recent refill history in Dr Annemarie database. Actual last refill date was 07/26/2024 for both medications listed above for 90 day supply. Patient has 3 refills remaining for atorvastatin  and no refills remaining for losartan . Next appointment with PCP is 08/26/2024.    Insurance report was not up to date. No action needed at this time. Will follow adherence in 2026.   Madelin Ray, PharmD Clinical Pharmacist [facility]  Population Health [phone]

## 2024-08-05 NOTE — Telephone Encounter (Signed)
 Why didn't she take the Amitriptyline? Pt states she confused herself, she did take this last night and will continue but completely stopped tizanidine .   Should try again unless she thinks this contributed to her falling. She can start with 1/2 pill for a few nights if more comfortable. I am hoping this will help her tingling and pain. I have sent in a prednisone  pack to start tomorrow morning to help with the pain.  She needs to let us  know if any side effects from restarting the Amitriptyline. thx   Pt verbalized understanding to all recommendations.

## 2024-08-09 ENCOUNTER — Ambulatory Visit (INDEPENDENT_AMBULATORY_CARE_PROVIDER_SITE_OTHER): Admitting: Family Medicine

## 2024-08-09 VITALS — BP 130/80 | HR 111 | Ht 60.0 in | Wt 157.8 lb

## 2024-08-09 DIAGNOSIS — M25552 Pain in left hip: Secondary | ICD-10-CM

## 2024-08-09 DIAGNOSIS — M545 Low back pain, unspecified: Secondary | ICD-10-CM

## 2024-08-09 DIAGNOSIS — R296 Repeated falls: Secondary | ICD-10-CM | POA: Diagnosis not present

## 2024-08-09 DIAGNOSIS — G8929 Other chronic pain: Secondary | ICD-10-CM

## 2024-08-09 NOTE — Progress Notes (Signed)
 I, Angela Gross am a scribe for Dr. Artist Lloyd, MD.  Angela Gross is a 73 y.o. female who presents to [facility] at [facility] today for LBP x for about a month and a half ago. Pt locates pain to left side. The doctor gave her muscle relaxer at first but now the pain is a constant throb. Pain starts in the lower back comes across to the front of the body and radiates down the leg. It feels like a BIG cramp. Can feel it in the groin as well. Pain dose wake her up at night and prevents her from falling asleep. Some times the pain jumps from one location to the next. Passed out from the pain last week. The muscle relaxer made her legs weak and caused her to fall to the floor. Not sure what she hit when she fell because when she came to she rolled from her side to her back.    Dominant issue today is left low back pain without much radiating pain down her legs.  Radiating pain: yes LE numbness/tingling: yes because of neuropathy  LE weakness: yes Aggravates:sitting, standing, walking Treatments tried: Aleve  pain cream  Pt also c/o bilat hand pain x years. Pt locates pain to hands and feet.  She does have bilateral paresthesias and numbness in hands and feet that are thought to be due to neuropathy.  She did have a lumbar spine MRI earlier this year that did not show lumbar radiculopathy or nerve impingement.  She did have a nerve conduction study in 2016 that did not show large fiber neuropathy.  However she found that study to be very uncomfortable and painful and she is not looking forward to doing that again.  Pertinent review of systems: No fevers or chills  Relevant historical information: Neuropathy(in hands and feet), Fibromyalgia, Breast Removal from Cancer on right side, Rheumatoid Arthritis    Exam:  BP 130/80   Pulse (!) 111   Ht 5' (1.524 m)   Wt 157 lb 12.8 oz (71.6 kg)   SpO2 93%   BMI 30.82 kg/m  General: Well Developed, well nourished, and in  no acute distress.   MSK: L-spine: Normal-appearing Nontender palpation spinal midline.  Decreased lumbar motion.  Lower extremity strength is intact. Reflexes are intact. Sensation is decreased light touch.  Neuro alert and oriented normal coordination normal speech thought process and affect.  Moves face and extremities symmetrically.  Lab and Radiology Results   EXAM: MRI LUMBAR SPINE 05/14/2024 02:29:40 AM   TECHNIQUE: Multiplanar multisequence MRI of the lumbar spine was performed without the administration of intravenous contrast.   COMPARISON: 01/11/2015   CLINICAL HISTORY: Myelopathy, acute, lumbar spine; LLE Pain and numbness. AOX4, room air, no drips per nurse.   FINDINGS:   BONES AND ALIGNMENT: Normal alignment. Normal vertebral body heights. Bone marrow signal is unremarkable.   SPINAL CORD: The conus terminates normally.   SOFT TISSUES: No paraspinal mass.   L1-L2: No significant disc herniation. No spinal canal stenosis or neural foraminal narrowing.   L2-L3: No significant disc herniation. No spinal canal stenosis or neural foraminal narrowing.   L3-L4: Small disc bulge and moderate facet hypertrophy with increased widening and fluid in the facets. No spinal canal stenosis.   L4-L5: Mild facet hypertrophy without stenosis. No significant disc herniation.   L5-S1: No significant disc herniation. No spinal canal stenosis or neural foraminal narrowing.   IMPRESSION: 1. L3-4 small disc bulge and moderate facet hypertrophy  with increased widening and fluid in the facets. 2. L4-5 mild facet hypertrophy without stenosis.   Electronically signed by: Franky Stanford MD 05/14/2024 02:43 AM EDT RP Workstation: [id number]   I, Artist Lloyd, personally (independently) visualized and performed the interpretation of the images attached in this note.    Assessment and Plan: 73 y.o. female with left low back pain worsening ongoing now for months.   Patient had a fall but does not appear to have sustained any significant injuries.  MRI does show degenerative changes lumbar spine that could explain back pain.  Degenerative changes and muscle dysfunction include differential for chronic left low back pain.  Unfortunately she does not have a good explanation for the paresthesias on her MRI.  I do think neuropathy is a good explanation for her numbness and tingling.  She does not want to do another nerve conduction study so we can hold off on that.  For now plan for physical therapy and reassess in about 2 months.   PDMP not reviewed this encounter. Orders Placed This Encounter  Procedures   Ambulatory referral to Physical Therapy    Referral Priority:   Routine    Referral Type:   Physical Medicine    Referral Reason:   Specialty Services Required    Requested Specialty:   Physical Therapy    Number of Visits Requested:   1   No orders of the defined types were placed in this encounter.    Discussed warning signs or symptoms. Please see discharge instructions. Patient expresses understanding.   The above documentation has been reviewed and is accurate and complete Artist Lloyd, M.D.

## 2024-08-09 NOTE — Patient Instructions (Addendum)
 Thank you for coming in today.   Please get an Xray today before you leave   Check back in 2 months

## 2024-08-25 ENCOUNTER — Ambulatory Visit: Admitting: Rehabilitative and Restorative Service Providers"

## 2024-08-25 ENCOUNTER — Encounter: Payer: Self-pay | Admitting: Rehabilitative and Restorative Service Providers"

## 2024-08-25 DIAGNOSIS — M79605 Pain in left leg: Secondary | ICD-10-CM

## 2024-08-25 DIAGNOSIS — M6281 Muscle weakness (generalized): Secondary | ICD-10-CM | POA: Diagnosis not present

## 2024-08-25 DIAGNOSIS — M5459 Other low back pain: Secondary | ICD-10-CM

## 2024-08-25 DIAGNOSIS — R262 Difficulty in walking, not elsewhere classified: Secondary | ICD-10-CM

## 2024-08-25 DIAGNOSIS — M79604 Pain in right leg: Secondary | ICD-10-CM

## 2024-08-25 DIAGNOSIS — R293 Abnormal posture: Secondary | ICD-10-CM

## 2024-08-25 NOTE — Therapy (Signed)
 OUTPATIENT PHYSICAL THERAPY EVALUATION   Patient Name: Angela Gross MRN: [id number] DOB:[birth year], 73 y.o., female Today's Date: 08/25/2024  END OF SESSION:  PT End of Session - 08/25/24 1424     Visit Number 1    Number of Visits 20    Date for Recertification  11/03/24    Authorization Type [employer] Medicare $20 copay    Progress Note Due on Visit 10    PT Start Time 1428    PT Stop Time 1509    PT Time Calculation (min) 41 min    Activity Tolerance Patient limited by pain    Behavior During Therapy [facility] for tasks assessed/performed          Past Medical History:  Diagnosis Date   Anxiety    Breast cancer (HCC)    right (01/10/2014)   Chronic left shoulder pain    work related injury (01/10/2014)   Constipation 10/04/2014   Degenerative disc disease, cervical    Elevated blood pressure reading without diagnosis of hypertension 09/15/2023   Fibromyalgia    Full dentures    History of breast cancer in female 10/25/2013   Hot flashes, menopausal 04/04/2014   Hypertension    Osteoarthritis of both knees    Osteoporosis    Personal history of chemotherapy    PONV (postoperative nausea and vomiting)    Raynaud disease    Rheumatoid arthritis (HCC)    elbows; fingers; toes   Syncope and collapse    4 times in the last year (01/10/2014)   Wears glasses    Past Surgical History:  Procedure Laterality Date   BREAST BIOPSY Right 10/2013   BREAST BIOPSY Left 07/22/2023   MM LT BREAST BX W LOC DEV 1ST LESION IMAGE BX SPEC STEREO GUIDE 07/22/2023 [facility]   BREAST BIOPSY Left 07/22/2023   MM LT BREAST BX W LOC DEV EA AD LESION IMG BX SPEC STEREO GUIDE 07/22/2023 [facility]   BREAST IMPLANT EXCHANGE Right 02/12/2016   Procedure: EXCHANGE SILICONE IMPLANT ON RIGHT, CAPSULORRAPHY AND PLACEMENT OF ALLODERM;  Surgeon: Earlis Ranks, MD;  Location: [facility];  Service: Plastics;  Laterality: Right;   BREAST RECONSTRUCTION Right 12/12/2014    Procedure: REVISION OF RIGHT RECONSTRUCTIVE BREAST WITH CAPSULORRHAPHY PLACEMENT OF SILCONE IMPLANTS AND LIPOFILLING TO RIGHT CHEST ;  Surgeon: Earlis Ranks, MD;  Location: [facility];  Service: Plastics;  Laterality: Right;   BREAST RECONSTRUCTION Right 03/06/2015   Procedure: RIGHT NIPPLE AEROLA COMPLEX RECONSTRUCTION AND LOCAL FLAP WITH LIPO FILLING TO RIGHT BREAST,REMOVAL RIGHT CHEST PORT;  Surgeon: Earlis Ranks, MD;  Location: [facility];  Service: Plastics;  Laterality: Right;   BREAST RECONSTRUCTION Right 05/18/2018   Procedure: revision right breast reconstruction with silicone implant exchange;  Surgeon: Ranks Earlis, MD;  Location: [facility];  Service: Plastics;  Laterality: Right;   BREAST RECONSTRUCTION WITH PLACEMENT OF TISSUE EXPANDER AND FLEX HD (ACELLULAR HYDRATED DERMIS) Right 12/13/2013   Procedure: RIGHT BREAST RECONSTRUCTION WITH PLACEMENT OF TISSUE EXPANDER AND FLEX HD (ACELLULAR HYDRATED DERMIS);  Surgeon: Earlis Ranks, MD;  Location: [facility];  Service: Plastics;  Laterality: Right;   CHOLECYSTECTOMY  1970's   COLONOSCOPY     LIPOMA EXCISION Bilateral 12/13/2013   Procedure: EXCISION OF LEFT BREAST KELOID AND RIGHT CHEST KELOID;  Surgeon: Earlis Ranks, MD;  Location: [facility];  Service: Plastics;  Laterality: Bilateral;   LIPOSUCTION WITH LIPOFILLING Right 09/26/2014   Procedure: LIPOSUCTION WITH  LIPOFILLING;  Surgeon: Earlis Ranks, MD;  Location: [facility];  Service: Plastics;  Laterality: Right;   LIPOSUCTION WITH LIPOFILLING Right 12/12/2014   Procedure: LIPOSUCTION WITH LIPOFILLING;  Surgeon: Earlis Ranks, MD;  Location: [facility];  Service: Plastics;  Laterality: Right;   LIPOSUCTION WITH LIPOFILLING Right 03/06/2015   Procedure: ABDOMINAL LIPOSUCTION WITH LIPOFILLING TO RIGHT BREAST;  Surgeon: Earlis Ranks, MD;  Location: [facility];  Service: Plastics;  Laterality: Right;   MASTECTOMY Right 2015   MASTECTOMY W/ SENTINEL NODE BIOPSY Right 12/13/2013   Procedure: RIGHT TOTAL MASTECTOMY WITH SENTINEL LYMPH NODE BIOPSY;  Surgeon: Elon CHRISTELLA Pacini, MD;  Location: [facility];  Service: General;  Laterality: Right;   MASTOPEXY Left 09/26/2014   Procedure: MASTOPEXY;  Surgeon: Earlis Ranks, MD;  Location: [facility];  Service: Plastics;  Laterality: Left;   PORT-A-CATH REMOVAL Right 03/06/2015   Procedure: REMOVAL PORT-A-CATH;  Surgeon: Earlis Ranks, MD;  Location: [facility];  Service: Plastics;  Laterality: Right;   PORTACATH PLACEMENT Right 12/13/2013   Procedure: INSERTION PORT-A-CATH;  Surgeon: Elon CHRISTELLA Pacini, MD;  Location: [facility];  Service: General;  Laterality: Right;   REDUCTION MAMMAPLASTY Left 2015   REMOVAL OF BILATERAL TISSUE EXPANDERS WITH PLACEMENT OF BILATERAL BREAST IMPLANTS Bilateral 09/26/2014   Procedure: REMOVAL OF RIGHT TISSUE EXPANDER WITH PLACEMENT OF RIGHT BREAST IMPLANT LIPOFILLING TO RIGHT BREAST/LEFT BREAST MASTOPEXY FOR SYMMETRY;  Surgeon: Earlis Ranks, MD;  Location: [facility];  Service: Plastics;  Laterality: Bilateral;   TISSUE EXPANDER PLACEMENT Right 01/10/2014   Procedure: Incision and Drainage Right Breast Seroma with TISSUE EXPANDER exchange;  Surgeon: Earlis Ranks, MD;  Location: [facility];  Service: Plastics;  Laterality: Right;   VAGINAL HYSTERECTOMY  1980   no salpingo-oophoretomu   Patient Active Problem List   Diagnosis Date Noted   Left lumbar radiculopathy 08/03/2024   Peripheral polyneuropathy 08/03/2024   Bilateral swelling of feet and ankles 08/03/2024   Radiculopathy of cervical region 08/03/2024   Essential (primary) hypertension 09/17/2023   Osteopenia of lumbar spine 09/15/2023   Mixed hyperlipidemia 04/27/2023   Chronic gout of foot 02/11/2023   Chronic kidney disease,  stage 3b (HCC) 02/11/2023   Borderline diabetes 01/25/2023   Osteopenia of both hips 02/27/2015   Personal history of malignant neoplasm of breast 09/26/2014   Rheumatoid arthritis (HCC) 06/27/2014   Chemotherapy induced cardiomyopathy 05/11/2014   Chemotherapy-induced neuropathy 03/14/2014    PCP: Lucius Krabbe NP  REFERRING PROVIDER: Joane Artist RAMAN, MD  REFERRING DIAG: M54.42,M54.41,G89.29 (ICD-10-CM) - Chronic bilateral low back pain with bilateral sciatica R29.6 (ICD-10-CM) - Frequent falls  Rationale for Evaluation and Treatment: Rehabilitation  THERAPY DIAG:  Other low back pain  Pain in left leg  Pain in right leg  Muscle weakness (generalized)  Difficulty in walking, not elsewhere classified  Abnormal posture  ONSET DATE: worsened 06/2024  SUBJECTIVE:  SUBJECTIVE STATEMENT: Pt indicated having complaints in back and can feel pain run down into legs.  Insidious onset.  Reported numbness in feet as well.  Reported Rt knee weakness/buckling.  Aggravating factors: walking, sitting, standing prolonged, bending, transfers.   Pt indicated pain feels constant, dull at times and throbbing as well.    Reported numbness in both feet about the same.  Not radicular in description.   Symptoms are reported severe and impact daily activity severely.   Pt indicated waking due to symptoms but then has trouble getting back to sleep.  Reported every night.   PERTINENT HISTORY:  Anxiety, history of Rt breast cancer, DDD cervical, fibromyalgia, OA bilateral knee, osteoporosis, raynaud disease, RA  PAIN:  NPRS scale: at worst 10/10, at best 1/10, current upon arrival: 7/10 Pain location: back, pain in Lt back, posterior leg to knee and sometimes in upper shin.   Pain description: buckling, sore,  sharp, constant Aggravating factors: walking, sitting, standing prolonged, bending, transfers.  Relieving factors: resting, changing positioning.   PRECAUTIONS: None  WEIGHT BEARING RESTRICTIONS: No  FALLS:  Has patient fallen in last 6 months? 1 fall with no injury.   Has a fear of falling.   LIVING ENVIRONMENT: Stairs: has access to elevation.   Flight of stairs available.    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  - reported trouble with it.   PLOF: Independent, wants to walk, read.   PATIENT GOALS: Reduce pain, walk for exercise, sleep better.   OBJECTIVE:   IMAGING: 08/25/2024: IMPRESSION: 1. L3-4 small disc bulge and moderate facet hypertrophy with increased widening and fluid in the facets. 2. L4-5 mild facet hypertrophy without stenosis.  PATIENT SURVEYS:  Patient-Specific Activity Scoring Scheme  0 represents "unable to perform." 10 represents "able to perform at prior level. 0 1 2 3 4 5 6 7 8 9  10 (Date and Score)   Activity Eval  08/25/2024    1. Walking prolonged (>1 hr)  1    2. Sitting prolonged (30 mins)  1    3. Bending to pick stuff up 1   4. Getting up off floor 1   5.  Chair transfers 1   Score 1 avg    Total score = sum of the activity scores/number of activities Minimum detectable change (90%CI) for average score = 2 points Minimum detectable change (90%CI) for single activity score = 3 points  SCREENING FOR RED FLAGS: 08/25/2024 Bowel or bladder incontinence: No Cauda equina syndrome: No  COGNITION: 08/25/2024 Overall cognitive status: WFL normal      SENSATION: 08/25/2024 Not tested  MUSCLE LENGTH: 08/25/2024   POSTURE:  08/25/2024 rounded shoulders   PALPATION: 08/25/2024 Mild tenderness in lumbar, Lt posterior/lateral hip musculature.   LUMBAR ROM:  08/25/2024 Directional Preference Assessment: Centralization: not observed Peripheralization: not observed  AROM  Eval 08/25/2024  Flexion To mid shin without increase pain  Both legs tightness noted  Extension   Right lateral flexion   Left lateral flexion   Right rotation   Left rotation    (Blank rows = not tested)  LOWER EXTREMITY ROM:      Right Eval 08/25/2024 Left Eval 08/25/2024  Hip flexion    Hip extension    Hip abduction    Hip adduction    Hip internal rotation 40  PROM in supine 90 deg hip flexion 30 PROM in supine 90 deg hip flexion  Hip external rotation 55 PROM in  supine 90 deg hip flexion 45 PROM in supine 90 deg hip flexion  Knee flexion    Knee extension    Ankle dorsiflexion    Ankle plantarflexion    Ankle inversion    Ankle eversion     (Blank rows = not tested)  LOWER EXTREMITY MMT:    MMT Right Eval 08/25/2024 Left Eval 08/25/2024  Hip flexion 4/5 4/5  Hip extension    Hip abduction    Hip adduction    Hip internal rotation    Hip external rotation    Knee flexion 5/5 5/5  Knee extension 4/5 5/5  Ankle dorsiflexion 5/5 5/5  Ankle plantarflexion    Ankle inversion    Ankle eversion     (Blank rows = not tested)  LUMBAR SPECIAL TESTS:  08/25/2024 (-) slump bilateral ( no radicular pain noted).  Mild tightness behind knee bilaterally   FUNCTIONAL TESTS:  08/25/2024 18 inch chair transfer (hands on knees) on 1st try.    GAIT: 08/25/2024 Independent ambulation.  Widened base of support in ambulation, evident of variable bilateral trendelenburg at times.                                                                                                                                                                                                                   TODAY'S TREATMENT:                                                                                                         DATE: 08/25/2024  Therex:    HEP instruction/performance c cues for techniques, handout provided.  Trial set performed of each for comprehension and symptom  assessment.  See below for exercise list .Specific cues given to progress mobility with tightness stretching not painful reaction.   PATIENT EDUCATION:  08/25/2024 Education details: HEP, POC Person educated: Patient Education method: Programmer, Multimedia, Demonstration, Verbal cues, and Handouts Education comprehension: verbalized understanding, returned demonstration, and verbal cues required  HOME EXERCISE PROGRAM: Access Code: [email or web address] URL: [email or web address] Date: 08/25/2024 Prepared by: Ozell Silvan  Exercises -  Standing Lumbar Extension  - 2-4 x daily - 7 x weekly - 1 sets - 5-10 reps - Supine Lower Trunk Rotation  - 2-3 x daily - 7 x weekly - 1 sets - 3-5 reps - 15 hold - Supine Figure 4 Piriformis Stretch  - 2-3 x daily - 7 x weekly - 1 sets - 5 reps - 15 hold - Supine Figure 4 pull towards  - 2-3 x daily - 7 x weekly - 1 sets - 5 reps - 15 hold - Hooklying Isometric Clamshell  - 1-2 x daily - 7 x weekly - 2-3 sets - 10-15 reps - Supine Bridge with Resistance Band  - 1-2 x daily - 7 x weekly - 1-2 sets - 10-15 reps - 2 hold  ASSESSMENT:  CLINICAL IMPRESSION: Patient is a 73 y.o. who comes to clinic with complaints of back, bilateral LE pain with mobility, strength and movement coordination deficits that impair their ability to perform usual daily and recreational functional activities without increase difficulty/symptoms at this time.  Patient to benefit from skilled PT services to address impairments and limitations to improve to previous level of function without restriction secondary to condition.   OBJECTIVE IMPAIRMENTS: Abnormal gait, decreased activity tolerance, decreased balance, decreased coordination, decreased endurance, decreased mobility, difficulty walking, decreased ROM, decreased strength, hypomobility, increased fascial restrictions, impaired perceived functional ability, increased muscle spasms, impaired flexibility, improper body mechanics, postural  dysfunction, and pain.   ACTIVITY LIMITATIONS: carrying, lifting, bending, sitting, standing, squatting, sleeping, stairs, transfers, bed mobility, and locomotion level  PARTICIPATION LIMITATIONS: meal prep, cleaning, laundry, interpersonal relationship, shopping, community activity, and occupation  PERSONAL FACTORS: Time since onset of injury/illness/exacerbation and Anxiety, history of Rt breast cancer, DDD cervical, fibromyalgia, OA bilateral knee, osteoporosis, raynaud disease, RA are also affecting patient's functional outcome.   REHAB POTENTIAL: Fair to good  CLINICAL DECISION MAKING: Evolving/moderate complexity  EVALUATION COMPLEXITY: Moderate   GOALS: Goals reviewed with patient? Yes  SHORT TERM GOALS: (target date for Short term goals are 3 weeks 09/15/2024)  1. Patient will demonstrate independent use of home exercise program to maintain progress from in clinic treatments.  Goal status: New  LONG TERM GOALS: (target dates for all long term goals are 10 weeks  11/03/2024 )   1. Patient will demonstrate/report pain at worst less than or equal to 2/10 to facilitate minimal limitation in daily activity secondary to pain symptoms.  Goal status: New   2. Patient will demonstrate independent use of home exercise program to facilitate ability to maintain/progress functional gains from skilled physical therapy services.  Goal status: New   3. Patient will demonstrate Patient specific functional scale avg > or = 7/10 to indicate reduced disability due to condition.   Goal status: New   4. Patient will demonstrate lumbar extension 100 % WFL s symptoms to facilitate upright standing, walking posture at PLOF s limitation.  Goal status: New   5.  Patient will demonstrate bilateral LE MMT 5/5 throughout to facilitate transfers, stairs, ambulation at PLOF.   Goal status: New   6.  Patient will demonstrate ascending/descending stairs reciprocally s UE assist for community  integration.   Goal status: New   7.  Patient will demonstrate 18 inch chair transfer s UE assist on 1st try s deviations.  Goal Status: New  PLAN:  PT FREQUENCY: 1-2x/week  PT DURATION: 10 weeks  PLANNED INTERVENTIONS: Can include 02853- PT Re-evaluation, 97110-Therapeutic exercises, 97530- Therapeutic activity, [id number]- Neuromuscular re-education, 97535- Self Care, [id number]-  Manual therapy, [id number]- Gait training, 02239- Orthotic Fit/training, 04007- Canalith repositioning, 02886- Aquatic Therapy, [phone]- Electrical stimulation (unattended), [id number] Physical performance testing, 02983- Vasopneumatic device, [id number]- Ultrasound, [id number]- Traction (mechanical), [id number]- Ionotophoresis 4mg /ml Dexamethasone ,  79439 - Needle insertion w/o injection 1 or 2 muscles, 20561 - Needle insertion w/o injection 3 or more muscles.    Patient/Family education, Balance training, Stair training, Taping, Dry Needling, Joint mobilization, Joint manipulation, Spinal manipulation, Spinal mobilization, Scar mobilization, Vestibular training, Visual/preceptual remediation/compensation, DME instructions, Cryotherapy, and Moist heat.  All performed as medically necessary.  All included unless contraindicated  PLAN FOR NEXT SESSION: Review HEP knowledge/results.    Ozell Silvan, PT, DPT, OCS, ATC 08/25/24  3:13 PM   Date of referral: 08/09/2024 Referring provider: Joane Artist RAMAN, MD Referring diagnosis? M54.42,M54.41,G89.29 (ICD-10-CM) - Chronic bilateral low back pain with bilateral sciatica R29.6 (ICD-10-CM) - Frequent falls Treatment diagnosis? (if different than referring diagnosis) M54.59, M79.605, M79.604, M62.81, R26.2, R29.3, R29.6  What was this (referring dx) caused by? Ongoing Issue  Lysle of Condition: Chronic (continuous duration > 3 months)   Laterality: Both  Current Functional Measure Score: Patient Specific Functional Scale eval avg:   Objective measurements identify impairments when they are  compared to normal values, the uninvolved extremity, and prior level of function.  [x]  Yes  []  No  Objective assessment of functional ability: Severe functional limitations   Briefly describe symptoms: Pt indicated having complaints in back and can feel pain run down into legs.  Insidious onset.  Reported numbness in feet as well.  Reported Rt knee weakness/buckling.  Aggravating factors: walking, sitting, standing prolonged, bending, transfers.   Pt indicated pain feels constant, dull at times and throbbing as well.    Reported numbness in both feet about the same.  Not radicular in description.   Symptoms are reported severe and impact daily activity severely.   Pt indicated waking due to symptoms but then has trouble getting back to sleep.  Reported every night.   How did symptoms start: Insidious onset  Average pain intensity:  Last 24 hours: up to 10/10 consistently   Past week: up to 10/10 consistently   How often does the pt experience symptoms? Constantly  How much have the symptoms interfered with usual daily activities? Quite a bit  How has condition changed since care began at this facility? NA - initial visit  In general, how is the patients overall health? Fair   BACK PAIN (STarT Back Screening Tool) Has pain spread down the leg(s) at some time in the last 2 weeks? yes Has there been pain in the shoulder or neck at some time in the last 2 weeks? no Has the pt only walked short distances because of back pain? yes Has patient dressed more slowly because of back pain in the past 2 weeks? yes Does patient think it's not safe for a person with this condition to be physically active? yes Does patient have worrying thoughts a lot of the time? yes Does patient feel back pain is terrible and will never get any better? yes Has patient stopped enjoying things they usually enjoy? yes Overall, how bothersome has back pain been in the last 2 weeks?                    Very Much

## 2024-08-26 ENCOUNTER — Ambulatory Visit: Admitting: Family

## 2024-08-26 ENCOUNTER — Encounter: Payer: Self-pay | Admitting: Family

## 2024-08-26 VITALS — BP 134/82 | HR 112 | Temp 97.5°F | Ht 60.0 in

## 2024-08-26 DIAGNOSIS — M25471 Effusion, right ankle: Secondary | ICD-10-CM

## 2024-08-26 DIAGNOSIS — N1832 Chronic kidney disease, stage 3b: Secondary | ICD-10-CM

## 2024-08-26 DIAGNOSIS — E782 Mixed hyperlipidemia: Secondary | ICD-10-CM

## 2024-08-26 DIAGNOSIS — G629 Polyneuropathy, unspecified: Secondary | ICD-10-CM | POA: Diagnosis not present

## 2024-08-26 DIAGNOSIS — M25472 Effusion, left ankle: Secondary | ICD-10-CM

## 2024-08-26 DIAGNOSIS — M25474 Effusion, right foot: Secondary | ICD-10-CM

## 2024-08-26 DIAGNOSIS — M25475 Effusion, left foot: Secondary | ICD-10-CM

## 2024-08-26 DIAGNOSIS — M1A0790 Idiopathic chronic gout, unspecified ankle and foot, without tophus (tophi): Secondary | ICD-10-CM | POA: Diagnosis not present

## 2024-08-26 MED ORDER — FEBUXOSTAT 40 MG PO TABS: 40.0000 mg | ORAL_TABLET | Freq: Every day | ORAL | 5 refills | Status: AC

## 2024-08-26 MED ORDER — ATORVASTATIN CALCIUM 20 MG PO TABS: 20.0000 mg | ORAL_TABLET | Freq: Every day | ORAL | 5 refills | Status: AC

## 2024-08-26 MED ORDER — AMITRIPTYLINE HCL 25 MG PO TABS: 25.0000 mg | ORAL_TABLET | Freq: Every day | ORAL | 2 refills | Status: AC

## 2024-08-26 NOTE — Progress Notes (Signed)
 Patient ID: Angela Gross, female    DOB: [birth year], 73 y.o.   MRN: [id number]  Chief Complaint  Patient presents with   Bilateral swelling of feet and ankles    Sx have resolved.  Discussed the use of AI scribe software for clinical note transcription with the patient, who gave verbal consent to proceed.  History of Present Illness Angela Gross is a 73 year old female who presents for medication management and follow-up.  She recently experienced syncope, attributed to a muscle relaxer, which she has discontinued. She now takes Elavil at bedtime for numbness, tingling, pain, and headache prevention, which does not cause excessive drowsiness. She manages her kidney health by drinking two liters of water daily and takes Lasix in the mornings to reduce ankle swelling. She is undergoing physical therapy for six to eight weeks. Her other current medications include atorvastatin , Plaquenil, Rincoq and Uloric , with recent refills obtained. Her blood pressure is stable. She previously took Lyrica  for neuropathy but now uses Elavil at night for nerve pain, burning, and tingling sensations.  Assessment & Plan Polyneuropathy Numbness, tingling, and occasional pain managed with Elavil. Lyrica  discontinued due to inefficacy and renal concerns, but she was able to pick up one more prescription. - Continue Elavil 25mg   at bedtime, sending refill. - Use Lyrica  only on severe symptom days. - F/U in 3 months  Bilateral leg swelling Managed with Lasix 20mg  qam. Emphasized hydration to prevent dehydration and dizziness. - Continue Lasix in the morning. - Ensure two liters of water daily, including caffeine-free beverages. - F/U in 3 mos with labs  Idiopathic chronic gout, ankle and foot, without tophus Chronic gout managed with Uloric . Prescription refilled. - Continue Uloric  40mg  qd as prescribed.  Mixed hyperlipidemia Managed with atorvastatin  20mg  qd. Recent cholesterol levels were  good. - Continue atorvastatin  as prescribed, sending refill. - F/U in 6 mos for fasting lab  Chronic kidney disease, stage 3b Chronic kidney disease with borderline function, GFR 37 05/2024. Emphasized medication management and hydration. - Ensure two liters of water daily. - Monitor medication use to avoid nephrotoxic agents  General Health Maintenance Engaged in physical therapy to improve leg strength and reduce fall risk. - Continue physical therapy for 6-8 weeks.  Subjective:    Outpatient Medications Prior to Visit  Medication Sig Dispense Refill   ACCU-CHEK GUIDE test strip USE TO TEST BLOOD SUGARS ONCE WEEKLY     amitriptyline (ELAVIL) 25 MG tablet Take 1 tablet (25 mg total) by mouth at bedtime. 30 tablet 2   atorvastatin  (LIPITOR) 20 MG tablet Take 1 tablet (20 mg total) by mouth daily. 90 tablet 1   B Complex-C (B-COMPLEX WITH VITAMIN C ) tablet Take 1 tablet by mouth daily.     colchicine  0.6 MG tablet Take 1 tablet (0.6 mg total) by mouth daily as needed (For Gout pain. Take for 5 days only.). 30 tablet 0   febuxostat  (ULORIC ) 40 MG tablet Take 1 tablet (40 mg total) by mouth daily. 30 tablet 2   fluticasone (FLONASE) 50 MCG/ACT nasal spray Place into both nostrils.     furosemide (LASIX) 20 MG tablet Take 1 tablet (20 mg total) by mouth every morning. 30 tablet 2   hydroxychloroquine (PLAQUENIL) 200 MG tablet 1 tab Orally twice a day for 30 days     losartan  (COZAAR ) 25 MG tablet TAKE 2 TABS IN THE MORNING AND 1 TAB IN THE EVENING. 270 tablet 1   Olopatadine  HCl 0.2 % SOLN  Apply 1 drop to eye daily. 2.5 mL 3   potassium chloride  SA (KLOR-CON  M) 20 MEQ tablet Take 1 tablet (20 mEq total) by mouth as directed. Take each day you take a Lasix pill.     predniSONE  (DELTASONE ) 10 MG tablet Take after breakfast:  6 tab day 1, 5 tab day 2-3, 4 tab day 4, 3 tab day 5, 2 tab day 6-7 27 tablet 0   Upadacitinib ER (RINVOQ) 15 MG TB24 Take by mouth.     Vitamin D , Cholecalciferol , 25  MCG (1000 UT) CAPS Take 25 mcg by mouth daily.     No facility-administered medications prior to visit.   Past Medical History:  Diagnosis Date   Anxiety    Breast cancer (HCC)    right (01/10/2014)   Chronic left shoulder pain    work related injury (01/10/2014)   Constipation 10/04/2014   Degenerative disc disease, cervical    Elevated blood pressure reading without diagnosis of hypertension 09/15/2023   Fibromyalgia    Full dentures    History of breast cancer in female 10/25/2013   Hot flashes, menopausal 04/04/2014   Hypertension    Osteoarthritis of both knees    Osteoporosis    Personal history of chemotherapy    PONV (postoperative nausea and vomiting)    Raynaud disease    Rheumatoid arthritis (HCC)    elbows; fingers; toes   Syncope and collapse    4 times in the last year (01/10/2014)   Wears glasses    Past Surgical History:  Procedure Laterality Date   BREAST BIOPSY Right 10/2013   BREAST BIOPSY Left 07/22/2023   MM LT BREAST BX W LOC DEV 1ST LESION IMAGE BX SPEC STEREO GUIDE 07/22/2023 [facility]   BREAST BIOPSY Left 07/22/2023   MM LT BREAST BX W LOC DEV EA AD LESION IMG BX SPEC STEREO GUIDE 07/22/2023 [facility]   BREAST IMPLANT EXCHANGE Right 02/12/2016   Procedure: EXCHANGE SILICONE IMPLANT ON RIGHT, CAPSULORRAPHY AND PLACEMENT OF ALLODERM;  Surgeon: Earlis Ranks, MD;  Location: [facility];  Service: Plastics;  Laterality: Right;   BREAST RECONSTRUCTION Right 12/12/2014   Procedure: REVISION OF RIGHT RECONSTRUCTIVE BREAST WITH CAPSULORRHAPHY PLACEMENT OF SILCONE IMPLANTS AND LIPOFILLING TO RIGHT CHEST ;  Surgeon: Earlis Ranks, MD;  Location: [facility];  Service: Plastics;  Laterality: Right;   BREAST RECONSTRUCTION Right 03/06/2015   Procedure: RIGHT NIPPLE AEROLA COMPLEX RECONSTRUCTION AND LOCAL FLAP WITH LIPO FILLING TO RIGHT BREAST,REMOVAL RIGHT CHEST PORT;  Surgeon: Earlis Ranks, MD;  Location: [facility]  [place];  Service: Plastics;  Laterality: Right;   BREAST RECONSTRUCTION Right 05/18/2018   Procedure: revision right breast reconstruction with silicone implant exchange;  Surgeon: Ranks Earlis, MD;  Location: [facility];  Service: Plastics;  Laterality: Right;   BREAST RECONSTRUCTION WITH PLACEMENT OF TISSUE EXPANDER AND FLEX HD (ACELLULAR HYDRATED DERMIS) Right 12/13/2013   Procedure: RIGHT BREAST RECONSTRUCTION WITH PLACEMENT OF TISSUE EXPANDER AND FLEX HD (ACELLULAR HYDRATED DERMIS);  Surgeon: Earlis Ranks, MD;  Location: [facility];  Service: Plastics;  Laterality: Right;   CHOLECYSTECTOMY  1970's   COLONOSCOPY     LIPOMA EXCISION Bilateral 12/13/2013   Procedure: EXCISION OF LEFT BREAST KELOID AND RIGHT CHEST KELOID;  Surgeon: Earlis Ranks, MD;  Location: [facility];  Service: Plastics;  Laterality: Bilateral;   LIPOSUCTION WITH LIPOFILLING Right 09/26/2014   Procedure: LIPOSUCTION WITH LIPOFILLING;  Surgeon: Earlis Ranks, MD;  Location:  [facility];  Service: Plastics;  Laterality: Right;   LIPOSUCTION WITH LIPOFILLING Right 12/12/2014   Procedure: LIPOSUCTION WITH LIPOFILLING;  Surgeon: Earlis Ranks, MD;  Location: [facility];  Service: Plastics;  Laterality: Right;   LIPOSUCTION WITH LIPOFILLING Right 03/06/2015   Procedure: ABDOMINAL LIPOSUCTION WITH LIPOFILLING TO RIGHT BREAST;  Surgeon: Earlis Ranks, MD;  Location: [facility];  Service: Plastics;  Laterality: Right;   MASTECTOMY Right 2015   MASTECTOMY W/ SENTINEL NODE BIOPSY Right 12/13/2013   Procedure: RIGHT TOTAL MASTECTOMY WITH SENTINEL LYMPH NODE BIOPSY;  Surgeon: Elon CHRISTELLA Pacini, MD;  Location: [facility];  Service: General;  Laterality: Right;   MASTOPEXY Left 09/26/2014   Procedure: MASTOPEXY;  Surgeon: Earlis Ranks, MD;  Location: [facility];  Service: Plastics;  Laterality:  Left;   PORT-A-CATH REMOVAL Right 03/06/2015   Procedure: REMOVAL PORT-A-CATH;  Surgeon: Earlis Ranks, MD;  Location: [facility];  Service: Plastics;  Laterality: Right;   PORTACATH PLACEMENT Right 12/13/2013   Procedure: INSERTION PORT-A-CATH;  Surgeon: Elon CHRISTELLA Pacini, MD;  Location: [facility];  Service: General;  Laterality: Right;   REDUCTION MAMMAPLASTY Left 2015   REMOVAL OF BILATERAL TISSUE EXPANDERS WITH PLACEMENT OF BILATERAL BREAST IMPLANTS Bilateral 09/26/2014   Procedure: REMOVAL OF RIGHT TISSUE EXPANDER WITH PLACEMENT OF RIGHT BREAST IMPLANT LIPOFILLING TO RIGHT BREAST/LEFT BREAST MASTOPEXY FOR SYMMETRY;  Surgeon: Earlis Ranks, MD;  [facility];  Service: Plastics;  Laterality: Bilateral;   TISSUE EXPANDER PLACEMENT Right 01/10/2014   Procedure: Incision and Drainage Right Breast Seroma with TISSUE EXPANDER exchange;  Surgeon: Earlis Ranks, MD;  Location: [facility];  Service: Plastics;  Laterality: Right;   VAGINAL HYSTERECTOMY  1980   no salpingo-oophoretomu   Allergies  Allergen Reactions   Tizanidine  Other (See Comments)    Syncope   Codeine Hives and Other (See Comments)    Altered mental status, numbness    Diazepam Other (See Comments)    Delusions, hallucinations       Objective:    Physical Exam Vitals and nursing note reviewed.  Constitutional:      Appearance: Normal appearance.  Cardiovascular:     Rate and Rhythm: Normal rate and regular rhythm.  Pulmonary:     Effort: Pulmonary effort is normal.     Breath sounds: Normal breath sounds.  Musculoskeletal:        General: Normal range of motion.     Right lower leg: No edema.     Left lower leg: No edema.  Skin:    General: Skin is warm and dry.  Neurological:     Mental Status: She is alert.  Psychiatric:        Mood and Affect: Mood normal.        Behavior: Behavior normal.    BP 134/82 (BP Location: Left Arm, Patient Position:  Sitting, Cuff Size: Normal)   Pulse (!) 112   Temp (!) 97.5 F (36.4 C) (Temporal)   Ht 5' (1.524 m)   SpO2 93%   BMI 30.82 kg/m  Wt Readings from Last 3 Encounters:  08/09/24 157 lb 12.8 oz (71.6 kg)  08/03/24 161 lb 3.2 oz (73.1 kg)  05/20/24 162 lb (73.5 kg)      Lucius Krabbe, NP

## 2024-08-26 NOTE — Patient Instructions (Signed)
 It was very nice to see you today!   Glad you are feeling good! I have sent in refills for when you need them. Take the Lyrica  (Pregabalin ) just when needed on bad neuropathy days (burning pain/tingling). Because this effects your kidneys. The Amitriptyline at bedtime is for your neuropathy symptoms. Remember to drink at least 2 liters of water or non-caffeinated drinks all day!  Schedule a 2 month visit and we will check your electrolytes and kidney function then due to the Lasix (water pill).  Have a wonderful Thanksgiving!     PLEASE NOTE:  If you had any lab tests please let us  know if you have not heard back within a few days. You may see your results on MyChart before we have a chance to review them but we will give you a call once they are reviewed by us . If we ordered any referrals today, please let us  know if you have not heard from their office within the next week.

## 2024-09-13 ENCOUNTER — Telehealth: Payer: Self-pay | Admitting: Rehabilitative and Restorative Service Providers"

## 2024-09-13 ENCOUNTER — Encounter: Admitting: Rehabilitative and Restorative Service Providers"

## 2024-09-13 NOTE — Telephone Encounter (Signed)
 Pt indicated forgetting about appointment.  Reminded her of next appointment time on Dec 5 11 am.   Ozell Silvan, PT, DPT, OCS, ATC 09/13/24  4:18 PM

## 2024-09-13 NOTE — Therapy (Incomplete)
 OUTPATIENT PHYSICAL THERAPY TREATMENT   Patient Name: Angela Gross MRN: [id number] DOB:[birth year], 73 y.o., female Today's Date: 09/13/2024  END OF SESSION:    Past Medical History:  Diagnosis Date   Anxiety    Breast cancer (HCC)    right (01/10/2014)   Chronic left shoulder pain    work related injury (01/10/2014)   Constipation 10/04/2014   Degenerative disc disease, cervical    Elevated blood pressure reading without diagnosis of hypertension 09/15/2023   Fibromyalgia    Full dentures    History of breast cancer in female 10/25/2013   Hot flashes, menopausal 04/04/2014   Hypertension    Osteoarthritis of both knees    Osteoporosis    Personal history of chemotherapy    PONV (postoperative nausea and vomiting)    Raynaud disease    Rheumatoid arthritis (HCC)    elbows; fingers; toes   Syncope and collapse    4 times in the last year (01/10/2014)   Wears glasses    Past Surgical History:  Procedure Laterality Date   BREAST BIOPSY Right 10/2013   BREAST BIOPSY Left 07/22/2023   MM LT BREAST BX W LOC DEV 1ST LESION IMAGE BX SPEC STEREO GUIDE 07/22/2023 [facility]   BREAST BIOPSY Left 07/22/2023   MM LT BREAST BX W LOC DEV EA AD LESION IMG BX SPEC STEREO GUIDE 07/22/2023 [facility]   BREAST IMPLANT EXCHANGE Right 02/12/2016   Procedure: EXCHANGE SILICONE IMPLANT ON RIGHT, CAPSULORRAPHY AND PLACEMENT OF ALLODERM;  Surgeon: Earlis Ranks, MD;  Location: [facility];  Service: Plastics;  Laterality: Right;   BREAST RECONSTRUCTION Right 12/12/2014   Procedure: REVISION OF RIGHT RECONSTRUCTIVE BREAST WITH CAPSULORRHAPHY PLACEMENT OF SILCONE IMPLANTS AND LIPOFILLING TO RIGHT CHEST ;  Surgeon: Earlis Ranks, MD;  Location: [facility];  Service: Plastics;  Laterality: Right;   BREAST RECONSTRUCTION Right 03/06/2015   Procedure: RIGHT NIPPLE AEROLA COMPLEX RECONSTRUCTION AND LOCAL FLAP WITH LIPO FILLING TO RIGHT BREAST,REMOVAL  RIGHT CHEST PORT;  Surgeon: Earlis Ranks, MD;  Location: [facility];  Service: Plastics;  Laterality: Right;   BREAST RECONSTRUCTION Right 05/18/2018   Procedure: revision right breast reconstruction with silicone implant exchange;  Surgeon: Ranks Earlis, MD;  Location: [facility];  Service: Plastics;  Laterality: Right;   BREAST RECONSTRUCTION WITH PLACEMENT OF TISSUE EXPANDER AND FLEX HD (ACELLULAR HYDRATED DERMIS) Right 12/13/2013   Procedure: RIGHT BREAST RECONSTRUCTION WITH PLACEMENT OF TISSUE EXPANDER AND FLEX HD (ACELLULAR HYDRATED DERMIS);  Surgeon: Earlis Ranks, MD;  Location: [facility];  Service: Plastics;  Laterality: Right;   CHOLECYSTECTOMY  1970's   COLONOSCOPY     LIPOMA EXCISION Bilateral 12/13/2013   Procedure: EXCISION OF LEFT BREAST KELOID AND RIGHT CHEST KELOID;  Surgeon: Earlis Ranks, MD;  Location: [facility];  Service: Plastics;  Laterality: Bilateral;   LIPOSUCTION WITH LIPOFILLING Right 09/26/2014   Procedure: LIPOSUCTION WITH LIPOFILLING;  Surgeon: Earlis Ranks, MD;  Location: [facility];  Service: Plastics;  Laterality: Right;   LIPOSUCTION WITH LIPOFILLING Right 12/12/2014   Procedure: LIPOSUCTION WITH LIPOFILLING;  Surgeon: Earlis Ranks, MD;  Location: [facility];  Service: Plastics;  Laterality: Right;   LIPOSUCTION WITH LIPOFILLING Right 03/06/2015   Procedure: ABDOMINAL LIPOSUCTION WITH LIPOFILLING TO RIGHT BREAST;  Surgeon: Earlis Ranks, MD;  Location: [facility];  Service: Plastics;  Laterality: Right;   MASTECTOMY Right 2015   MASTECTOMY W/ SENTINEL NODE BIOPSY Right 12/13/2013  Procedure: RIGHT TOTAL MASTECTOMY WITH SENTINEL LYMPH NODE BIOPSY;  Surgeon: Elon CHRISTELLA Pacini, MD;  Location: [facility];  Service: General;  Laterality: Right;   MASTOPEXY Left 09/26/2014   Procedure: MASTOPEXY;  Surgeon: Earlis Ranks, MD;   Location: [facility];  Service: Plastics;  Laterality: Left;   PORT-A-CATH REMOVAL Right 03/06/2015   Procedure: REMOVAL PORT-A-CATH;  Surgeon: Earlis Ranks, MD;  Location: [facility];  Service: Plastics;  Laterality: Right;   PORTACATH PLACEMENT Right 12/13/2013   Procedure: INSERTION PORT-A-CATH;  Surgeon: Elon CHRISTELLA Pacini, MD;  Location: [facility];  Service: General;  Laterality: Right;   REDUCTION MAMMAPLASTY Left 2015   REMOVAL OF BILATERAL TISSUE EXPANDERS WITH PLACEMENT OF BILATERAL BREAST IMPLANTS Bilateral 09/26/2014   Procedure: REMOVAL OF RIGHT TISSUE EXPANDER WITH PLACEMENT OF RIGHT BREAST IMPLANT LIPOFILLING TO RIGHT BREAST/LEFT BREAST MASTOPEXY FOR SYMMETRY;  Surgeon: Earlis Ranks, MD;  Location: [facility];  Service: Plastics;  Laterality: Bilateral;   TISSUE EXPANDER PLACEMENT Right 01/10/2014   Procedure: Incision and Drainage Right Breast Seroma with TISSUE EXPANDER exchange;  Surgeon: Earlis Ranks, MD;  Location: [facility];  Service: Plastics;  Laterality: Right;   VAGINAL HYSTERECTOMY  1980   no salpingo-oophoretomu   Patient Active Problem List   Diagnosis Date Noted   Left lumbar radiculopathy 08/03/2024   Peripheral polyneuropathy 08/03/2024   Bilateral swelling of feet and ankles 08/03/2024   Radiculopathy of cervical region 08/03/2024   Essential (primary) hypertension 09/17/2023   Osteopenia of lumbar spine 09/15/2023   Mixed hyperlipidemia 04/27/2023   Chronic gout of foot 02/11/2023   Chronic kidney disease, stage 3b (HCC) 02/11/2023   Borderline diabetes 01/25/2023   Osteopenia of both hips 02/27/2015   Personal history of malignant neoplasm of breast 09/26/2014   Rheumatoid arthritis (HCC) 06/27/2014   Chemotherapy induced cardiomyopathy 05/11/2014   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  ONSET DATE: worsened 06/2024  SUBJECTIVE:                                                                                                                                                                                           SUBJECTIVE STATEMENT: Pt indicated having complaints in back and can feel pain run down into legs.  Insidious onset.  Reported numbness in feet as well.  Reported Rt knee weakness/buckling.  Aggravating factors: walking, sitting, standing prolonged, bending, transfers.   Pt indicated pain feels constant, dull at times and throbbing as well.    Reported numbness in both feet about the same.  Not radicular in description.   Symptoms are reported severe and impact daily activity severely.   Pt indicated waking due to symptoms but then has trouble getting back to sleep.  Reported every night.   PERTINENT HISTORY:  Anxiety, history of Rt breast cancer, DDD cervical, fibromyalgia, OA bilateral knee, osteoporosis, raynaud disease, RA  PAIN:  NPRS scale: at worst 10/10, at best 1/10, current upon arrival: 7/10 Pain location: back, pain in Lt back, posterior leg to knee and sometimes in upper shin.   Pain description: buckling, sore, sharp, constant Aggravating factors: walking, sitting, standing prolonged, bending, transfers.  Relieving factors: resting, changing positioning.   PRECAUTIONS: None  WEIGHT BEARING RESTRICTIONS: No  FALLS:  Has patient fallen in last 6 months? 1 fall with no injury.   Has a fear of falling.   LIVING ENVIRONMENT: Stairs: has access to elevation.   Flight of stairs available.    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  - reported trouble with it.   PLOF: Independent, wants to walk,  read.   PATIENT GOALS: Reduce pain, walk for exercise, sleep better.   OBJECTIVE:   IMAGING: 08/25/2024: IMPRESSION: 1. L3-4 small disc bulge and moderate facet hypertrophy with increased widening and fluid in the facets. 2. L4-5 mild facet hypertrophy without stenosis.  PATIENT SURVEYS:  Patient-Specific Activity Scoring Scheme  0 represents "unable to perform." 10 represents "able to perform at prior level. 0 1 2 3 4 5 6 7 8 9  10 (Date and Score)   Activity Eval  08/25/2024    1. Walking prolonged (>1 hr)  1    2. Sitting prolonged (30 mins)  1    3. Bending to pick stuff up 1   4. Getting up off floor 1   5.  Chair transfers 1   Score 1 avg    Total score = sum of the activity scores/number of activities Minimum detectable change (90%CI) for average score = 2 points Minimum detectable change (90%CI) for single activity score = 3 points  SCREENING FOR RED FLAGS: 08/25/2024 Bowel or bladder incontinence: No Cauda equina syndrome: No  COGNITION: 08/25/2024 Overall cognitive status: WFL normal      SENSATION: 08/25/2024 Not tested  MUSCLE LENGTH: 08/25/2024   POSTURE:  08/25/2024 rounded shoulders   PALPATION: 08/25/2024 Mild tenderness in lumbar, Lt posterior/lateral hip musculature.   LUMBAR ROM:  08/25/2024 Directional Preference Assessment: Centralization: not observed Peripheralization: not observed  AROM Eval 08/25/2024 09/13/2024  Flexion To mid shin without increase pain  Both legs tightness noted   Extension    Right lateral flexion    Left lateral flexion    Right rotation    Left rotation     (Blank rows = not tested)  LOWER EXTREMITY ROM:      Right Eval 08/25/2024 Left Eval 08/25/2024  Hip flexion    Hip extension    Hip abduction    Hip adduction    Hip internal rotation 40  PROM in supine 90 deg hip flexion 30 PROM in supine 90 deg hip flexion  Hip external rotation 55 PROM in supine 90 deg hip flexion 45 PROM  in supine 90 deg hip flexion  Knee flexion    Knee extension    Ankle  dorsiflexion    Ankle plantarflexion    Ankle inversion    Ankle eversion     (Blank rows = not tested)  LOWER EXTREMITY MMT:    MMT Right Eval 08/25/2024 Left Eval 08/25/2024  Hip flexion 4/5 4/5  Hip extension    Hip abduction    Hip adduction    Hip internal rotation    Hip external rotation    Knee flexion 5/5 5/5  Knee extension 4/5 5/5  Ankle dorsiflexion 5/5 5/5  Ankle plantarflexion    Ankle inversion    Ankle eversion     (Blank rows = not tested)  LUMBAR SPECIAL TESTS:  08/25/2024 (-) slump bilateral ( no radicular pain noted).  Mild tightness behind knee bilaterally   FUNCTIONAL TESTS:  08/25/2024 18 inch chair transfer (hands on knees) on 1st try.    GAIT: 08/25/2024 Independent ambulation.  Widened base of support in ambulation, evident of variable bilateral trendelenburg at times.                                                                                                                                                                                                                   TODAY'S TREATMENT:                                                                                                         DATE: 09/13/2024  Therex:    TODAY'S TREATMENT:                                                                                                         DATE: 08/25/2024  Therex:  HEP instruction/performance c cues for techniques, handout provided.  Trial set performed of each for comprehension and symptom assessment.  See below for exercise list .Specific cues given to progress mobility with tightness stretching not painful reaction.   PATIENT EDUCATION:  08/25/2024 Education details: HEP, POC Person educated: Patient Education method: Programmer, Multimedia, Demonstration, Verbal cues, and Handouts Education comprehension: verbalized understanding, returned demonstration, and verbal  cues required  HOME EXERCISE PROGRAM: Access Code: [email or web address] URL: [email or web address] Date: 08/25/2024 Prepared by: Ozell Silvan  Exercises - Standing Lumbar Extension  - 2-4 x daily - 7 x weekly - 1 sets - 5-10 reps - Supine Lower Trunk Rotation  - 2-3 x daily - 7 x weekly - 1 sets - 3-5 reps - 15 hold - Supine Figure 4 Piriformis Stretch  - 2-3 x daily - 7 x weekly - 1 sets - 5 reps - 15 hold - Supine Figure 4 pull towards  - 2-3 x daily - 7 x weekly - 1 sets - 5 reps - 15 hold - Hooklying Isometric Clamshell  - 1-2 x daily - 7 x weekly - 2-3 sets - 10-15 reps - Supine Bridge with Resistance Band  - 1-2 x daily - 7 x weekly - 1-2 sets - 10-15 reps - 2 hold  ASSESSMENT:  CLINICAL IMPRESSION: ***   OBJECTIVE IMPAIRMENTS: Abnormal gait, decreased activity tolerance, decreased balance, decreased coordination, decreased endurance, decreased mobility, difficulty walking, decreased ROM, decreased strength, hypomobility, increased fascial restrictions, impaired perceived functional ability, increased muscle spasms, impaired flexibility, improper body mechanics, postural dysfunction, and pain.   ACTIVITY LIMITATIONS: carrying, lifting, bending, sitting, standing, squatting, sleeping, stairs, transfers, bed mobility, and locomotion level  PARTICIPATION LIMITATIONS: meal prep, cleaning, laundry, interpersonal relationship, shopping, community activity, and occupation  PERSONAL FACTORS: Time since onset of injury/illness/exacerbation and Anxiety, history of Rt breast cancer, DDD cervical, fibromyalgia, OA bilateral knee, osteoporosis, raynaud disease, RA are also affecting patient's functional outcome.   REHAB POTENTIAL: Fair to good  CLINICAL DECISION MAKING: Evolving/moderate complexity  EVALUATION COMPLEXITY: Moderate   GOALS: Goals reviewed with patient? Yes  SHORT TERM GOALS: (target date for Short term goals are 3 weeks 09/15/2024)  1. Patient will demonstrate  independent use of home exercise program to maintain progress from in clinic treatments.  Goal status: *** 09/13/2024  LONG TERM GOALS: (target dates for all long term goals are 10 weeks  11/03/2024 )   1. Patient will demonstrate/report pain at worst less than or equal to 2/10 to facilitate minimal limitation in daily activity secondary to pain symptoms.  Goal status: New   2. Patient will demonstrate independent use of home exercise program to facilitate ability to maintain/progress functional gains from skilled physical therapy services.  Goal status: New   3. Patient will demonstrate Patient specific functional scale avg > or = 7/10 to indicate reduced disability due to condition.   Goal status: New   4. Patient will demonstrate lumbar extension 100 % WFL s symptoms to facilitate upright standing, walking posture at PLOF s limitation.  Goal status: New   5.  Patient will demonstrate bilateral LE MMT 5/5 throughout to facilitate transfers, stairs, ambulation at PLOF.   Goal status: New   6.  Patient will demonstrate ascending/descending stairs reciprocally s UE assist for community integration.   Goal status: New   7.  Patient will demonstrate 18 inch chair transfer s UE assist on 1st try s deviations.  Goal Status: New  PLAN:  PT FREQUENCY: 1-2x/week  PT DURATION:  10 weeks  PLANNED INTERVENTIONS: Can include 02853- PT Re-evaluation, 97110-Therapeutic exercises, 97530- Therapeutic activity, [id number]- Neuromuscular re-education, 97535- Self Care, 97140- Manual therapy, [phone]- Gait training, [phone]- Orthotic Fit/training, [phone]- Canalith repositioning, [id number]- Aquatic Therapy, [phone]- Electrical stimulation (unattended), [id number] Physical performance testing, 97016- Vasopneumatic device, [id number]- Ultrasound, [id number]- Traction (mechanical), [id number]- Ionotophoresis 4mg /ml Dexamethasone ,  79439 - Needle insertion w/o injection 1 or 2 muscles, 20561 - Needle insertion w/o injection 3 or more  muscles.    Patient/Family education, Balance training, Stair training, Taping, Dry Needling, Joint mobilization, Joint manipulation, Spinal manipulation, Spinal mobilization, Scar mobilization, Vestibular training, Visual/preceptual remediation/compensation, DME instructions, Cryotherapy, and Moist heat.  All performed as medically necessary.  All included unless contraindicated  PLAN FOR NEXT SESSION: Review HEP knowledge/results.    Ozell Silvan, PT, DPT, OCS, ATC 09/13/24  12:54 PM   Date of referral: 08/09/2024 Referring provider: Joane Artist RAMAN, MD Referring diagnosis? M54.42,M54.41,G89.29 (ICD-10-CM) - Chronic bilateral low back pain with bilateral sciatica R29.6 (ICD-10-CM) - Frequent falls Treatment diagnosis? (if different than referring diagnosis) M54.59, M79.605, M79.604, M62.81, R26.2, R29.3, R29.6  What was this (referring dx) caused by? Ongoing Issue  Lysle of Condition: Chronic (continuous duration > 3 months)   Laterality: Both  Current Functional Measure Score: Patient Specific Functional Scale eval avg:   Objective measurements identify impairments when they are compared to normal values, the uninvolved extremity, and prior level of function.  [x]  Yes  []  No  Objective assessment of functional ability: Severe functional limitations   Briefly describe symptoms: Pt indicated having complaints in back and can feel pain run down into legs.  Insidious onset.  Reported numbness in feet as well.  Reported Rt knee weakness/buckling.  Aggravating factors: walking, sitting, standing prolonged, bending, transfers.   Pt indicated pain feels constant, dull at times and throbbing as well.    Reported numbness in both feet about the same.  Not radicular in description.   Symptoms are reported severe and impact daily activity severely.   Pt indicated waking due to symptoms but then has trouble getting back to sleep.  Reported every night.   How did symptoms start: Insidious  onset  Average pain intensity:  Last 24 hours: up to 10/10 consistently   Past week: up to 10/10 consistently   How often does the pt experience symptoms? Constantly  How much have the symptoms interfered with usual daily activities? Quite a bit  How has condition changed since care began at this facility? NA - initial visit  In general, how is the patients overall health? Fair   BACK PAIN (STarT Back Screening Tool) Has pain spread down the leg(s) at some time in the last 2 weeks? yes Has there been pain in the shoulder or neck at some time in the last 2 weeks? no Has the pt only walked short distances because of back pain? yes Has patient dressed more slowly because of back pain in the past 2 weeks? yes Does patient think it's not safe for a person with this condition to be physically active? yes Does patient have worrying thoughts a lot of the time? yes Does patient feel back pain is terrible and will never get any better? yes Has patient stopped enjoying things they usually enjoy? yes Overall, how bothersome has back pain been in the last 2 weeks?                    Very Much

## 2024-09-15 ENCOUNTER — Encounter: Payer: Self-pay | Admitting: Rehabilitative and Restorative Service Providers"

## 2024-09-15 ENCOUNTER — Ambulatory Visit: Admitting: Rehabilitative and Restorative Service Providers"

## 2024-09-15 DIAGNOSIS — R262 Difficulty in walking, not elsewhere classified: Secondary | ICD-10-CM

## 2024-09-15 DIAGNOSIS — M79605 Pain in left leg: Secondary | ICD-10-CM | POA: Diagnosis not present

## 2024-09-15 DIAGNOSIS — M6281 Muscle weakness (generalized): Secondary | ICD-10-CM

## 2024-09-15 DIAGNOSIS — M79604 Pain in right leg: Secondary | ICD-10-CM

## 2024-09-15 DIAGNOSIS — M5459 Other low back pain: Secondary | ICD-10-CM

## 2024-09-15 DIAGNOSIS — R293 Abnormal posture: Secondary | ICD-10-CM

## 2024-09-15 NOTE — Therapy (Signed)
 OUTPATIENT PHYSICAL THERAPY TREATMENT   Patient Name: Angela Gross MRN: [id number] DOB:1951-03-15, 73 y.o., female Today's Date: 09/15/2024  END OF SESSION:  PT End of Session - 09/15/24 1055     Visit Number 2    Number of Visits 20    Date for Recertification  11/03/24    Authorization Type [employer] Medicare $20 copay    Progress Note Due on Visit 10    PT Start Time 1055    PT Stop Time 1145    PT Time Calculation (min) 50 min    Activity Tolerance Patient limited by pain    Behavior During Therapy [facility] for tasks assessed/performed           Past Medical History:  Diagnosis Date   Anxiety    Breast cancer (HCC)    right (01/10/2014)   Chronic left shoulder pain    work related injury (01/10/2014)   Constipation 10/04/2014   Degenerative disc disease, cervical    Elevated blood pressure reading without diagnosis of hypertension 09/15/2023   Fibromyalgia    Full dentures    History of breast cancer in female 10/25/2013   Hot flashes, menopausal 04/04/2014   Hypertension    Osteoarthritis of both knees    Osteoporosis    Personal history of chemotherapy    PONV (postoperative nausea and vomiting)    Raynaud disease    Rheumatoid arthritis (HCC)    elbows; fingers; toes   Syncope and collapse    4 times in the last year (01/10/2014)   Wears glasses    Past Surgical History:  Procedure Laterality Date   BREAST BIOPSY Right 10/2013   BREAST BIOPSY Left 07/22/2023   MM LT BREAST BX W LOC DEV 1ST LESION IMAGE BX SPEC STEREO GUIDE 07/22/2023 [facility]   BREAST BIOPSY Left 07/22/2023   MM LT BREAST BX W LOC DEV EA AD LESION IMG BX SPEC STEREO GUIDE 07/22/2023 [facility]   BREAST IMPLANT EXCHANGE Right 02/12/2016   Procedure: EXCHANGE SILICONE IMPLANT ON RIGHT, CAPSULORRAPHY AND PLACEMENT OF ALLODERM;  Surgeon: Earlis Ranks, MD;  Location: [facility];  Service: Plastics;  Laterality: Right;   BREAST RECONSTRUCTION Right 12/12/2014    Procedure: REVISION OF RIGHT RECONSTRUCTIVE BREAST WITH CAPSULORRHAPHY PLACEMENT OF SILCONE IMPLANTS AND LIPOFILLING TO RIGHT CHEST ;  Surgeon: Earlis Ranks, MD;  Location: [facility];  Service: Plastics;  Laterality: Right;   BREAST RECONSTRUCTION Right 03/06/2015   Procedure: RIGHT NIPPLE AEROLA COMPLEX RECONSTRUCTION AND LOCAL FLAP WITH LIPO FILLING TO RIGHT BREAST,REMOVAL RIGHT CHEST PORT;  Surgeon: Earlis Ranks, MD;  Location: [facility];  Service: Plastics;  Laterality: Right;   BREAST RECONSTRUCTION Right 05/18/2018   Procedure: revision right breast reconstruction with silicone implant exchange;  Surgeon: Ranks Earlis, MD;  Location: [facility];  Service: Plastics;  Laterality: Right;   BREAST RECONSTRUCTION WITH PLACEMENT OF TISSUE EXPANDER AND FLEX HD (ACELLULAR HYDRATED DERMIS) Right 12/13/2013   Procedure: RIGHT BREAST RECONSTRUCTION WITH PLACEMENT OF TISSUE EXPANDER AND FLEX HD (ACELLULAR HYDRATED DERMIS);  Surgeon: Earlis Ranks, MD;  Location: [facility];  Service: Plastics;  Laterality: Right;   CHOLECYSTECTOMY  1970's   COLONOSCOPY     LIPOMA EXCISION Bilateral 12/13/2013   Procedure: EXCISION OF LEFT BREAST KELOID AND RIGHT CHEST KELOID;  Surgeon: Earlis Ranks, MD;  Location: [facility];  Service: Plastics;  Laterality: Bilateral;   LIPOSUCTION WITH LIPOFILLING Right 09/26/2014   Procedure: LIPOSUCTION  WITH LIPOFILLING;  Surgeon: Earlis Ranks, MD;  Location: [facility];  Service: Plastics;  Laterality: Right;   LIPOSUCTION WITH LIPOFILLING Right 12/12/2014   Procedure: LIPOSUCTION WITH LIPOFILLING;  Surgeon: Earlis Ranks, MD;  Location: [facility];  Service: Plastics;  Laterality: Right;   LIPOSUCTION WITH LIPOFILLING Right 03/06/2015   Procedure: ABDOMINAL LIPOSUCTION WITH LIPOFILLING TO RIGHT BREAST;  Surgeon: Earlis Ranks, MD;  Location: [facility];  Service: Plastics;  Laterality: Right;   MASTECTOMY Right 2015   MASTECTOMY W/ SENTINEL NODE BIOPSY Right 12/13/2013   Procedure: RIGHT TOTAL MASTECTOMY WITH SENTINEL LYMPH NODE BIOPSY;  Surgeon: Elon CHRISTELLA Pacini, MD;  Location: [facility];  Service: General;  Laterality: Right;   MASTOPEXY Left 09/26/2014   Procedure: MASTOPEXY;  Surgeon: Earlis Ranks, MD;  Location: [facility];  Service: Plastics;  Laterality: Left;   PORT-A-CATH REMOVAL Right 03/06/2015   Procedure: REMOVAL PORT-A-CATH;  Surgeon: Earlis Ranks, MD;  Location: [facility];  Service: Plastics;  Laterality: Right;   PORTACATH PLACEMENT Right 12/13/2013   Procedure: INSERTION PORT-A-CATH;  Surgeon: Elon CHRISTELLA Pacini, MD;  Location: [facility];  Service: General;  Laterality: Right;   REDUCTION MAMMAPLASTY Left 2015   REMOVAL OF BILATERAL TISSUE EXPANDERS WITH PLACEMENT OF BILATERAL BREAST IMPLANTS Bilateral 09/26/2014   Procedure: REMOVAL OF RIGHT TISSUE EXPANDER WITH PLACEMENT OF RIGHT BREAST IMPLANT LIPOFILLING TO RIGHT BREAST/LEFT BREAST MASTOPEXY FOR SYMMETRY;  Surgeon: Earlis Ranks, MD;  [facility];  Service: Plastics;  Laterality: Bilateral;   TISSUE EXPANDER PLACEMENT Right 01/10/2014   Procedure: Incision and Drainage Right Breast Seroma with TISSUE EXPANDER exchange;  Surgeon: Earlis Ranks, MD;  Location: [facility];  Service: Plastics;  Laterality: Right;   VAGINAL HYSTERECTOMY  1980   no salpingo-oophoretomu   Patient Active Problem List   Diagnosis Date Noted   Left lumbar radiculopathy 08/03/2024   Peripheral polyneuropathy 08/03/2024   Bilateral swelling of feet and ankles 08/03/2024   Radiculopathy of cervical region 08/03/2024   Essential (primary) hypertension 09/17/2023   Osteopenia of lumbar spine 09/15/2023   Mixed hyperlipidemia 04/27/2023   Chronic gout of foot 02/11/2023   Chronic kidney disease,  stage 3b (HCC) 02/11/2023   Borderline diabetes 01/25/2023   Osteopenia of both hips 02/27/2015   Personal history of malignant neoplasm of breast 09/26/2014   Rheumatoid arthritis (HCC) 06/27/2014   Chemotherapy induced cardiomyopathy 05/11/2014   Chemotherapy-induced neuropathy 03/14/2014    PCP: Lucius Krabbe NP  REFERRING PROVIDER: Joane Artist RAMAN, MD  REFERRING DIAG: M54.42,M54.41,G89.29 (ICD-10-CM) - Chronic bilateral low back pain with bilateral sciatica R29.6 (ICD-10-CM) - Frequent falls  Rationale for Evaluation and Treatment: Rehabilitation  THERAPY DIAG:  Other low back pain  Pain in left leg  Pain in right leg  Muscle weakness (generalized)  Difficulty in walking, not elsewhere classified  Abnormal posture  ONSET DATE: worsened 06/2024  SUBJECTIVE:  SUBJECTIVE STATEMENT: Dawna notes walking and flexed postures are aggravating.  She reports HEP compliance and is motivated to participate in her physical therapy.  Pt indicated having complaints in back and can feel pain run down into legs.  Insidious onset.  Reported numbness in feet as well.  Reported Rt knee weakness/buckling.  Aggravating factors: walking, sitting, standing prolonged, bending, transfers.   Pt indicated pain feels constant, dull at times and throbbing as well.    Reported numbness in both feet about the same.  Not radicular in description.   Symptoms are reported severe and impact daily activity severely.   Pt indicated waking due to symptoms but then has trouble getting back to sleep.  Reported every night.   PERTINENT HISTORY:  Anxiety, history of Rt breast cancer, DDD cervical, fibromyalgia, OA bilateral knee, osteoporosis, raynaud disease, RA  PAIN:  NPRS scale: As high as 7/10 this week (has been  10/10) Pain location: back, pain in Lt back, posterior leg to knee and sometimes in upper shin.   Pain description: buckling, sore, sharp, constant Aggravating factors: walking, sitting, standing prolonged, bending, transfers.  Relieving factors: resting, changing positioning.   PRECAUTIONS: None  WEIGHT BEARING RESTRICTIONS: No  FALLS:  Has patient fallen in last 6 months? 1 fall with no injury.   Has a fear of falling.   LIVING ENVIRONMENT: Stairs: has access to elevation.   Flight of stairs available.    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  - reported trouble with it.   PLOF: Independent, wants to walk, read.   PATIENT GOALS: Reduce pain, walk for exercise, sleep better.   OBJECTIVE:   IMAGING: 08/25/2024: IMPRESSION: 1. L3-4 small disc bulge and moderate facet hypertrophy with increased widening and fluid in the facets. 2. L4-5 mild facet hypertrophy without stenosis.  PATIENT SURVEYS:  Patient-Specific Activity Scoring Scheme  0 represents "unable to perform." 10 represents "able to perform at prior level. 0 1 2 3 4 5 6 7 8 9  10 (Date and Score)   Activity Eval  08/25/2024    1. Walking prolonged (>1 hr)  1    2. Sitting prolonged (30 mins)  1    3. Bending to pick stuff up 1   4. Getting up off floor 1   5.  Chair transfers 1   Score 1 avg    Total score = sum of the activity scores/number of activities Minimum detectable change (90%CI) for average score = 2 points Minimum detectable change (90%CI) for single activity score = 3 points  SCREENING FOR RED FLAGS: 08/25/2024 Bowel or bladder incontinence: No Cauda equina syndrome: No  COGNITION: 08/25/2024 Overall cognitive status: WFL normal      SENSATION: 08/25/2024 Not tested  MUSCLE LENGTH: 08/25/2024   POSTURE:  08/25/2024 rounded shoulders   PALPATION: 08/25/2024 Mild tenderness in lumbar, Lt posterior/lateral hip musculature.   LUMBAR  ROM:  08/25/2024 Directional Preference Assessment: Centralization: not observed Peripheralization: not observed  AROM Eval 08/25/2024  Flexion To mid shin without increase pain  Both legs tightness noted  Extension   Right lateral flexion   Left lateral flexion   Right rotation   Left rotation    (Blank rows = not tested)  LOWER EXTREMITY ROM:      Right Eval 08/25/2024 Left Eval 08/25/2024  Hip flexion    Hip extension    Hip abduction    Hip adduction    Hip internal rotation 40  PROM in supine 90 deg hip flexion 30 PROM in supine 90 deg hip flexion  Hip external rotation 55 PROM in supine 90 deg hip flexion 45 PROM in supine 90 deg hip flexion  Knee flexion    Knee extension    Ankle dorsiflexion    Ankle plantarflexion    Ankle inversion    Ankle eversion     (Blank rows = not tested)  LOWER EXTREMITY MMT:    MMT Right Eval 08/25/2024 Left Eval 08/25/2024  Hip flexion 4/5 4/5  Hip extension    Hip abduction    Hip adduction    Hip internal rotation    Hip external rotation    Knee flexion 5/5 5/5  Knee extension 4/5 5/5  Ankle dorsiflexion 5/5 5/5  Ankle plantarflexion    Ankle inversion    Ankle eversion     (Blank rows = not tested)  LUMBAR SPECIAL TESTS:  08/25/2024 (-) slump bilateral ( no radicular pain noted).  Mild tightness behind knee bilaterally   FUNCTIONAL TESTS:  08/25/2024 18 inch chair transfer (hands on knees) on 1st try.    GAIT: 08/25/2024 Independent ambulation.  Widened base of support in ambulation, evident of variable bilateral trendelenburg at times.                                                                                                                                                                                                                   TODAY'S TREATMENT:                                                                                                         DATE:  09/15/2024 Lumbar extension AROM  (hips forward) 10 x 3 seconds Yoga Bridge 2 sets of 10 for 5 seconds Clams with Black Thera-Band Resistance supine 2 sets of 10 for 5 seconds Figure 4 stretch Push 5 x 15 seconds Figure 4 stretch Pull 5 x 15 seconds  Functional Activities: Practical bed mobility (log roll); practical golfer's and diagonal sqaut lifts; practical kitchen mechanics at the sink; discussed  walking for exercise with a shopping cart or cane as long as there is no increase in peripheral symptoms   08/25/2024  Therex:    HEP instruction/performance c cues for techniques, handout provided.  Trial set performed of each for comprehension and symptom assessment.  See below for exercise list .Specific cues given to progress mobility with tightness stretching not painful reaction.   PATIENT EDUCATION:  08/25/2024 Education details: HEP, POC Person educated: Patient Education method: Programmer, Multimedia, Demonstration, Verbal cues, and Handouts Education comprehension: verbalized understanding, returned demonstration, and verbal cues required  HOME EXERCISE PROGRAM: Access Code: [email or web address] URL: [email or web address] Date: 09/15/2024 Prepared by: Lamar Ivory  Exercises - Standing Lumbar Extension  - 2-4 x daily - 7 x weekly - 1 sets - 5-10 reps - Supine Lower Trunk Rotation  - 2 x daily - 7 x weekly - 1 sets - 5 reps - 10-15 hold - Supine Figure 4 Piriformis Stretch  - 2 x daily - 7 x weekly - 1 sets - 5 reps - 10-15 hold - Supine Figure 4 pull towards  - 2 x daily - 7 x weekly - 1 sets - 5 reps - 10-15 hold - Hooklying Isometric Clamshell  - 2 x daily - 7 x weekly - 2 sets - 10 reps - 5 seconds hold - Supine Bridge with Resistance Band  - 2 x daily - 7 x weekly - 2 sets - 10 reps - 5 hold - Standing Lumbar Extension at Wall - Forearms  - 5 x daily - 7 x weekly - 1 sets - 5 reps - 3 seconds hold  ASSESSMENT:  CLINICAL IMPRESSION: Shy reports and demonstrates a good understanding of her day 1 HEP.  I  gave her another option for lumbar extension active range and spent the majority of today reviewing practical and body mechanics education.  Jameica will benefit from the recommended plan of care.  Patient is a 73 y.o. who comes to clinic with complaints of back, bilateral LE pain with mobility, strength and movement coordination deficits that impair their ability to perform usual daily and recreational functional activities without increase difficulty/symptoms at this time.  Patient to benefit from skilled PT services to address impairments and limitations to improve to previous level of function without restriction secondary to condition.   OBJECTIVE IMPAIRMENTS: Abnormal gait, decreased activity tolerance, decreased balance, decreased coordination, decreased endurance, decreased mobility, difficulty walking, decreased ROM, decreased strength, hypomobility, increased fascial restrictions, impaired perceived functional ability, increased muscle spasms, impaired flexibility, improper body mechanics, postural dysfunction, and pain.   ACTIVITY LIMITATIONS: carrying, lifting, bending, sitting, standing, squatting, sleeping, stairs, transfers, bed mobility, and locomotion level  PARTICIPATION LIMITATIONS: meal prep, cleaning, laundry, interpersonal relationship, shopping, community activity, and occupation  PERSONAL FACTORS: Time since onset of injury/illness/exacerbation and Anxiety, history of Rt breast cancer, DDD cervical, fibromyalgia, OA bilateral knee, osteoporosis, raynaud disease, RA are also affecting patient's functional outcome.   REHAB POTENTIAL: Fair to good  CLINICAL DECISION MAKING: Evolving/moderate complexity  EVALUATION COMPLEXITY: Moderate   GOALS: Goals reviewed with patient? Yes  SHORT TERM GOALS: (target date for Short term goals are 3 weeks 09/15/2024)  1. Patient will demonstrate independent use of home exercise program to maintain progress from in clinic  treatments.  Goal status: Met 09/15/2024  LONG TERM GOALS: (target dates for all long term goals are 10 weeks  11/03/2024 )   1. Patient will demonstrate/report pain at worst less than or equal to 2/10 to facilitate minimal limitation in  daily activity secondary to pain symptoms.  Goal status: New   2. Patient will demonstrate independent use of home exercise program to facilitate ability to maintain/progress functional gains from skilled physical therapy services.  Goal status: New   3. Patient will demonstrate Patient specific functional scale avg > or = 7/10 to indicate reduced disability due to condition.   Goal status: New   4. Patient will demonstrate lumbar extension 100 % WFL s symptoms to facilitate upright standing, walking posture at PLOF s limitation.  Goal status: New   5.  Patient will demonstrate bilateral LE MMT 5/5 throughout to facilitate transfers, stairs, ambulation at PLOF.   Goal status: New   6.  Patient will demonstrate ascending/descending stairs reciprocally s UE assist for community integration.   Goal status: New   7.  Patient will demonstrate 18 inch chair transfer s UE assist on 1st try s deviations.  Goal Status: New  PLAN:  PT FREQUENCY: 1-2x/week  PT DURATION: 10 weeks  PLANNED INTERVENTIONS: Can include 02853- PT Re-evaluation, 97110-Therapeutic exercises, 97530- Therapeutic activity, [id number]- Neuromuscular re-education, 97535- Self Care, 97140- Manual therapy, [phone]- Gait training, [phone]- Orthotic Fit/training, [phone]- Canalith repositioning, [id number]- Aquatic Therapy, [phone]- Electrical stimulation (unattended), [id number] Physical performance testing, 97016- Vasopneumatic device, [id number]- Ultrasound, [id number]- Traction (mechanical), [id number]- Ionotophoresis 4mg /ml Dexamethasone ,  79439 - Needle insertion w/o injection 1 or 2 muscles, 20561 - Needle insertion w/o injection 3 or more muscles.    Patient/Family education, Balance training, Stair training, Taping,  Dry Needling, Joint mobilization, Joint manipulation, Spinal manipulation, Spinal mobilization, Scar mobilization, Vestibular training, Visual/preceptual remediation/compensation, DME instructions, Cryotherapy, and Moist heat.  All performed as medically necessary.  All included unless contraindicated  PLAN FOR NEXT SESSION: Review HEP knowledge/results.  Practical work to help with long-term management.  Myer LELON Ivory PT, MPT 09/15/24  3:47 PM   Date of referral: 08/09/2024 Referring provider: Joane Artist RAMAN, MD Referring diagnosis? M54.42,M54.41,G89.29 (ICD-10-CM) - Chronic bilateral low back pain with bilateral sciatica R29.6 (ICD-10-CM) - Frequent falls Treatment diagnosis? (if different than referring diagnosis) M54.59, M79.605, M79.604, M62.81, R26.2, R29.3, R29.6  What was this (referring dx) caused by? Ongoing Issue  Lysle of Condition: Chronic (continuous duration > 3 months)   Laterality: Both  Current Functional Measure Score: Patient Specific Functional Scale eval avg:   Objective measurements identify impairments when they are compared to normal values, the uninvolved extremity, and prior level of function.  [x]  Yes  []  No  Objective assessment of functional ability: Severe functional limitations   Briefly describe symptoms: Pt indicated having complaints in back and can feel pain run down into legs.  Insidious onset.  Reported numbness in feet as well.  Reported Rt knee weakness/buckling.  Aggravating factors: walking, sitting, standing prolonged, bending, transfers.   Pt indicated pain feels constant, dull at times and throbbing as well.    Reported numbness in both feet about the same.  Not radicular in description.   Symptoms are reported severe and impact daily activity severely.   Pt indicated waking due to symptoms but then has trouble getting back to sleep.  Reported every night.   How did symptoms start: Insidious onset  Average pain intensity:  Last 24 hours:  up to 10/10 consistently   Past week: up to 10/10 consistently   How often does the pt experience symptoms? Constantly  How much have the symptoms interfered with usual daily activities? Quite a bit  How has condition changed since care began at this facility? NA -  initial visit  In general, how is the patients overall health? Fair   BACK PAIN (STarT Back Screening Tool) Has pain spread down the leg(s) at some time in the last 2 weeks? yes Has there been pain in the shoulder or neck at some time in the last 2 weeks? no Has the pt only walked short distances because of back pain? yes Has patient dressed more slowly because of back pain in the past 2 weeks? yes Does patient think it's not safe for a person with this condition to be physically active? yes Does patient have worrying thoughts a lot of the time? yes Does patient feel back pain is terrible and will never get any better? yes Has patient stopped enjoying things they usually enjoy? yes Overall, how bothersome has back pain been in the last 2 weeks?                    Very Much

## 2024-09-16 ENCOUNTER — Encounter: Admitting: Rehabilitative and Restorative Service Providers"

## 2024-09-19 ENCOUNTER — Telehealth: Payer: Self-pay | Admitting: Rehabilitative and Restorative Service Providers"

## 2024-09-19 ENCOUNTER — Encounter: Admitting: Rehabilitative and Restorative Service Providers"

## 2024-09-19 NOTE — Telephone Encounter (Signed)
 Called after 15 mins no show for appointment.  LVM about missed about and reminded of no show policy of scheduling 1 visit at a time with another no show. Reminded of next appointment time.   Ozell Silvan, PT, DPT, OCS, ATC 09/19/24  2:01 PM

## 2024-09-21 ENCOUNTER — Telehealth: Payer: Self-pay | Admitting: Rehabilitative and Restorative Service Providers"

## 2024-09-21 ENCOUNTER — Encounter: Admitting: Rehabilitative and Restorative Service Providers"

## 2024-09-21 NOTE — Therapy (Incomplete)
 OUTPATIENT PHYSICAL THERAPY TREATMENT   Patient Name: Angela Gross MRN: [id number] DOB:02/14/1951, 73 y.o., female Today's Date: 09/21/2024  END OF SESSION:     Past Medical History:  Diagnosis Date   Anxiety    Breast cancer (HCC)    right (01/10/2014)   Chronic left shoulder pain    work related injury (01/10/2014)   Constipation 10/04/2014   Degenerative disc disease, cervical    Elevated blood pressure reading without diagnosis of hypertension 09/15/2023   Fibromyalgia    Full dentures    History of breast cancer in female 10/25/2013   Hot flashes, menopausal 04/04/2014   Hypertension    Osteoarthritis of both knees    Osteoporosis    Personal history of chemotherapy    PONV (postoperative nausea and vomiting)    Raynaud disease    Rheumatoid arthritis (HCC)    elbows; fingers; toes   Syncope and collapse    4 times in the last year (01/10/2014)   Wears glasses    Past Surgical History:  Procedure Laterality Date   BREAST BIOPSY Right 10/2013   BREAST BIOPSY Left 07/22/2023   MM LT BREAST BX W LOC DEV 1ST LESION IMAGE BX SPEC STEREO GUIDE 07/22/2023 [facility]   BREAST BIOPSY Left 07/22/2023   MM LT BREAST BX W LOC DEV EA AD LESION IMG BX SPEC STEREO GUIDE 07/22/2023 [facility]   BREAST IMPLANT EXCHANGE Right 02/12/2016   Procedure: EXCHANGE SILICONE IMPLANT ON RIGHT, CAPSULORRAPHY AND PLACEMENT OF ALLODERM;  Surgeon: Earlis Ranks, MD;  Location: [facility];  Service: Plastics;  Laterality: Right;   BREAST RECONSTRUCTION Right 12/12/2014   Procedure: REVISION OF RIGHT RECONSTRUCTIVE BREAST WITH CAPSULORRHAPHY PLACEMENT OF SILCONE IMPLANTS AND LIPOFILLING TO RIGHT CHEST ;  Surgeon: Earlis Ranks, MD;  Location: [facility];  Service: Plastics;  Laterality: Right;   BREAST RECONSTRUCTION Right 03/06/2015   Procedure: RIGHT NIPPLE AEROLA COMPLEX RECONSTRUCTION AND LOCAL FLAP WITH LIPO FILLING TO RIGHT  BREAST,REMOVAL RIGHT CHEST PORT;  Surgeon: Earlis Ranks, MD;  Location: [facility];  Service: Plastics;  Laterality: Right;   BREAST RECONSTRUCTION Right 05/18/2018   Procedure: revision right breast reconstruction with silicone implant exchange;  Surgeon: Ranks Earlis, MD;  Location: [facility];  Service: Plastics;  Laterality: Right;   BREAST RECONSTRUCTION WITH PLACEMENT OF TISSUE EXPANDER AND FLEX HD (ACELLULAR HYDRATED DERMIS) Right 12/13/2013   Procedure: RIGHT BREAST RECONSTRUCTION WITH PLACEMENT OF TISSUE EXPANDER AND FLEX HD (ACELLULAR HYDRATED DERMIS);  Surgeon: Earlis Ranks, MD;  Location: [facility];  Service: Plastics;  Laterality: Right;   CHOLECYSTECTOMY  1970's   COLONOSCOPY     LIPOMA EXCISION Bilateral 12/13/2013   Procedure: EXCISION OF LEFT BREAST KELOID AND RIGHT CHEST KELOID;  Surgeon: Earlis Ranks, MD;  Location: [facility];  Service: Plastics;  Laterality: Bilateral;   LIPOSUCTION WITH LIPOFILLING Right 09/26/2014   Procedure: LIPOSUCTION WITH LIPOFILLING;  Surgeon: Earlis Ranks, MD;  Location: [facility];  Service: Plastics;  Laterality: Right;   LIPOSUCTION WITH LIPOFILLING Right 12/12/2014   Procedure: LIPOSUCTION WITH LIPOFILLING;  Surgeon: Earlis Ranks, MD;  Location: [facility];  Service: Plastics;  Laterality: Right;   LIPOSUCTION WITH LIPOFILLING Right 03/06/2015   Procedure: ABDOMINAL LIPOSUCTION WITH LIPOFILLING TO RIGHT BREAST;  Surgeon: Earlis Ranks, MD;  Location:  [facility]ERY [facility];  Service: Plastics;  Laterality: Right;   MASTECTOMY Right 2015   MASTECTOMY W/ SENTINEL NODE BIOPSY Right  12/13/2013   Procedure: RIGHT TOTAL MASTECTOMY WITH SENTINEL LYMPH NODE BIOPSY;  Surgeon: Elon CHRISTELLA Pacini, MD;  Location: [facility];  Service: General;  Laterality: Right;   MASTOPEXY Left 09/26/2014   Procedure: MASTOPEXY;  Surgeon: Earlis Ranks, MD;  Location: [facility];  Service: Plastics;  Laterality: Left;   PORT-A-CATH REMOVAL Right 03/06/2015   Procedure: REMOVAL PORT-A-CATH;  Surgeon: Earlis Ranks, MD;  Location: [facility];  Service: Plastics;  Laterality: Right;   PORTACATH PLACEMENT Right 12/13/2013   Procedure: INSERTION PORT-A-CATH;  Surgeon: Elon CHRISTELLA Pacini, MD;  Location: [facility];  Service: General;  Laterality: Right;   REDUCTION MAMMAPLASTY Left 2015   REMOVAL OF BILATERAL TISSUE EXPANDERS WITH PLACEMENT OF BILATERAL BREAST IMPLANTS Bilateral 09/26/2014   Procedure: REMOVAL OF RIGHT TISSUE EXPANDER WITH PLACEMENT OF RIGHT BREAST IMPLANT LIPOFILLING TO RIGHT BREAST/LEFT BREAST MASTOPEXY FOR SYMMETRY;  Surgeon: Earlis Ranks, MD;  Location:  SURGERY CENTER;  Service: Plastics;  Laterality: Bilateral;   TISSUE EXPANDER PLACEMENT Right 01/10/2014   Procedure: Incision and Drainage Right Breast Seroma with TISSUE EXPANDER exchange;  Surgeon: Earlis Ranks, MD;  Location: [facility];  Service: Plastics;  Laterality: Right;   VAGINAL HYSTERECTOMY  1980   no salpingo-oophoretomu   Patient Active Problem List   Diagnosis Date Noted   Left lumbar radiculopathy 08/03/2024   Peripheral polyneuropathy 08/03/2024   Bilateral swelling of feet and ankles 08/03/2024   Radiculopathy of cervical region 08/03/2024   Essential (primary) hypertension 09/17/2023   Osteopenia of lumbar spine 09/15/2023   Mixed hyperlipidemia 04/27/2023   Chronic gout of foot 02/11/2023   Chronic kidney disease, stage 3b (HCC) 02/11/2023   Borderline diabetes 01/25/2023   Osteopenia of both hips 02/27/2015   Personal history of malignant neoplasm of breast 09/26/2014   Rheumatoid arthritis (HCC) 06/27/2014   Chemotherapy induced cardiomyopathy 05/11/2014   Chemotherapy-induced neuropathy 03/14/2014    PCP: Lucius Krabbe NP  REFERRING PROVIDER: Joane Artist RAMAN, MD  REFERRING  DIAG: M54.42,M54.41,G89.29 (ICD-10-CM) - Chronic bilateral low back pain with bilateral sciatica R29.6 (ICD-10-CM) - Frequent falls  Rationale for Evaluation and Treatment: Rehabilitation  THERAPY DIAG:  No diagnosis found.  ONSET DATE: worsened 06/2024  SUBJECTIVE:                                                                                                                                                                                           SUBJECTIVE STATEMENT: ***  PERTINENT HISTORY:  Anxiety, history of Rt breast cancer, DDD cervical, fibromyalgia, OA bilateral knee, osteoporosis, raynaud disease, RA  PAIN:  NPRS scale: As  high as 7/10 this week (has been 10/10) Pain location: back, pain in Lt back, posterior leg to knee and sometimes in upper shin.   Pain description: buckling, sore, sharp, constant Aggravating factors: walking, sitting, standing prolonged, bending, transfers.  Relieving factors: resting, changing positioning.   PRECAUTIONS: None  WEIGHT BEARING RESTRICTIONS: No  FALLS:  Has patient fallen in last 6 months? 1 fall with no injury.   Has a fear of falling.   LIVING ENVIRONMENT: Stairs: has access to elevation.   Flight of stairs available.    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  - reported trouble with it.   PLOF: Independent, wants to walk, read.   PATIENT GOALS: Reduce pain, walk for exercise, sleep better.   OBJECTIVE:   IMAGING: 08/25/2024: IMPRESSION: 1. L3-4 small disc bulge and moderate facet hypertrophy with increased widening and fluid in the facets. 2. L4-5 mild facet hypertrophy without stenosis.  PATIENT SURVEYS:  Patient-Specific Activity Scoring Scheme  0 represents unable to perform. 10 represents able to perform at prior level. 0 1 2 3 4 5 6 7 8 9  10 (Date and Score)   Activity Eval  08/25/2024    1. Walking prolonged (>1 hr)  1    2. Sitting prolonged (30 mins)  1     3. Bending to pick stuff up 1   4. Getting up off floor 1   5.  Chair transfers 1   Score 1 avg    Total score = sum of the activity scores/number of activities Minimum detectable change (90%CI) for average score = 2 points Minimum detectable change (90%CI) for single activity score = 3 points  SCREENING FOR RED FLAGS: 08/25/2024 Bowel or bladder incontinence: No Cauda equina syndrome: No  COGNITION: 08/25/2024 Overall cognitive status: WFL normal      SENSATION: 08/25/2024 Not tested  MUSCLE LENGTH: 08/25/2024   POSTURE:  08/25/2024 rounded shoulders   PALPATION: 08/25/2024 Mild tenderness in lumbar, Lt posterior/lateral hip musculature.   LUMBAR ROM:  08/25/2024 Directional Preference Assessment: Centralization: not observed Peripheralization: not observed  AROM Eval 08/25/2024 09/21/2024   Flexion To mid shin without increase pain  Both legs tightness noted ***  Extension  ***  Right lateral flexion    Left lateral flexion    Right rotation    Left rotation     (Blank rows = not tested)  LOWER EXTREMITY ROM:      Right Eval 08/25/2024 Left Eval 08/25/2024  Hip flexion    Hip extension    Hip abduction    Hip adduction    Hip internal rotation 40  PROM in supine 90 deg hip flexion 30 PROM in supine 90 deg hip flexion  Hip external rotation 55 PROM in supine 90 deg hip flexion 45 PROM in supine 90 deg hip flexion  Knee flexion    Knee extension    Ankle dorsiflexion    Ankle plantarflexion    Ankle inversion    Ankle eversion     (Blank rows = not tested)  LOWER EXTREMITY MMT:    MMT Right Eval 08/25/2024 Left Eval 08/25/2024  Hip flexion 4/5 4/5  Hip extension    Hip abduction    Hip adduction    Hip internal rotation    Hip external rotation    Knee flexion 5/5 5/5  Knee extension 4/5 5/5  Ankle dorsiflexion 5/5 5/5  Ankle plantarflexion    Ankle inversion  Ankle eversion     (Blank rows = not tested)  LUMBAR  SPECIAL TESTS:  08/25/2024 (-) slump bilateral ( no radicular pain noted).  Mild tightness behind knee bilaterally   FUNCTIONAL TESTS:  08/25/2024 18 inch chair transfer (hands on knees) on 1st try.    GAIT: 08/25/2024 Independent ambulation.  Widened base of support in ambulation, evident of variable bilateral trendelenburg at times.                                                                                                                                                                                                                   TODAY'S TREATMENT:                                                                            DATE: 09/21/2024 Therex:    TODAY'S TREATMENT:                                                                            DATE: 09/15/2024 Lumbar extension AROM (hips forward) 10 x 3 seconds Yoga Bridge 2 sets of 10 for 5 seconds Clams with Black Thera-Band Resistance supine 2 sets of 10 for 5 seconds Figure 4 stretch Push 5 x 15 seconds Figure 4 stretch Pull 5 x 15 seconds  Functional Activities: Practical bed mobility (log roll); practical golfer's and diagonal sqaut lifts; practical kitchen mechanics at the sink; discussed walking for exercise with a shopping cart or cane as long as there is no increase in peripheral symptoms   TODAY'S TREATMENT:  DATE:08/25/2024  Therex:    HEP instruction/performance c cues for techniques, handout provided.  Trial set performed of each for comprehension and symptom assessment.  See below for exercise list .Specific cues given to progress mobility with tightness stretching not painful reaction.   PATIENT EDUCATION:  08/25/2024 Education details: HEP, POC Person educated: Patient Education method: Programmer, Multimedia, Demonstration, Verbal cues, and Handouts Education comprehension: verbalized understanding, returned demonstration, and verbal cues  required  HOME EXERCISE PROGRAM: Access Code: [email or web address] URL: [email or web address] Date: 09/21/2024 Prepared by: Ozell Silvan  Exercises - Standing Lumbar Extension  - 2-4 x daily - 7 x weekly - 1 sets - 5-10 reps - Supine Lower Trunk Rotation  - 2 x daily - 7 x weekly - 1 sets - 5 reps - 10-15 hold - Supine Figure 4 Piriformis Stretch  - 2 x daily - 7 x weekly - 1 sets - 5 reps - 10-15 hold - Supine Figure 4 pull towards  - 2 x daily - 7 x weekly - 1 sets - 5 reps - 10-15 hold - Hooklying Isometric Clamshell  - 2 x daily - 7 x weekly - 2 sets - 10 reps - 5 seconds hold - Supine Bridge with Resistance Band  - 2 x daily - 7 x weekly - 2 sets - 10 reps - 5 hold  ASSESSMENT:  CLINICAL IMPRESSION: ***  OBJECTIVE IMPAIRMENTS: Abnormal gait, decreased activity tolerance, decreased balance, decreased coordination, decreased endurance, decreased mobility, difficulty walking, decreased ROM, decreased strength, hypomobility, increased fascial restrictions, impaired perceived functional ability, increased muscle spasms, impaired flexibility, improper body mechanics, postural dysfunction, and pain.   ACTIVITY LIMITATIONS: carrying, lifting, bending, sitting, standing, squatting, sleeping, stairs, transfers, bed mobility, and locomotion level  PARTICIPATION LIMITATIONS: meal prep, cleaning, laundry, interpersonal relationship, shopping, community activity, and occupation  PERSONAL FACTORS: Time since onset of injury/illness/exacerbation and Anxiety, history of Rt breast cancer, DDD cervical, fibromyalgia, OA bilateral knee, osteoporosis, raynaud disease, RA are also affecting patient's functional outcome.   REHAB POTENTIAL: Fair to good  CLINICAL DECISION MAKING: Evolving/moderate complexity  EVALUATION COMPLEXITY: Moderate   GOALS: Goals reviewed with patient? Yes  SHORT TERM GOALS: (target date for Short term goals are 3 weeks 09/15/2024)  1. Patient will demonstrate  independent use of home exercise program to maintain progress from in clinic treatments.  Goal status: Met 09/15/2024  LONG TERM GOALS: (target dates for all long term goals are 10 weeks  11/03/2024 )   1. Patient will demonstrate/report pain at worst less than or equal to 2/10 to facilitate minimal limitation in daily activity secondary to pain symptoms.  Goal status: New   2. Patient will demonstrate independent use of home exercise program to facilitate ability to maintain/progress functional gains from skilled physical therapy services.  Goal status: New   3. Patient will demonstrate Patient specific functional scale avg > or = 7/10 to indicate reduced disability due to condition.   Goal status: New   4. Patient will demonstrate lumbar extension 100 % WFL s symptoms to facilitate upright standing, walking posture at PLOF s limitation.  Goal status: New   5.  Patient will demonstrate bilateral LE MMT 5/5 throughout to facilitate transfers, stairs, ambulation at PLOF.   Goal status: New   6.  Patient will demonstrate ascending/descending stairs reciprocally s UE assist for community integration.   Goal status: New   7.  Patient will demonstrate 18 inch chair transfer s UE assist on 1st try s deviations.  Goal Status: New  PLAN:  PT FREQUENCY: 1-2x/week  PT DURATION: 10 weeks  PLANNED INTERVENTIONS: Can include 02853- PT Re-evaluation, 97110-Therapeutic exercises, 97530- Therapeutic activity, [id number]- Neuromuscular re-education, 97535- Self Care, 97140- Manual therapy, [phone]- Gait training, [phone]- Orthotic Fit/training, [phone]- Canalith repositioning, [id number]- Aquatic Therapy, [phone]- Electrical stimulation (unattended), [id number] Physical performance testing, 97016- Vasopneumatic device, [id number]- Ultrasound, [id number]- Traction (mechanical), [id number]- Ionotophoresis 4mg /ml Dexamethasone ,  79439 - Needle insertion w/o injection 1 or 2 muscles, 20561 - Needle insertion w/o injection 3 or more  muscles.    Patient/Family education, Balance training, Stair training, Taping, Dry Needling, Joint mobilization, Joint manipulation, Spinal manipulation, Spinal mobilization, Scar mobilization, Vestibular training, Visual/preceptual remediation/compensation, DME instructions, Cryotherapy, and Moist heat.  All performed as medically necessary.  All included unless contraindicated  PLAN FOR NEXT SESSION: Review HEP knowledge/results.  Practical work to help with long-term management.   Ozell Silvan, PT, DPT, OCS, ATC 09/21/24  9:11 AM    Date of referral: 08/09/2024 Referring provider: Joane Artist RAMAN, MD Referring diagnosis? M54.42,M54.41,G89.29 (ICD-10-CM) - Chronic bilateral low back pain with bilateral sciatica R29.6 (ICD-10-CM) - Frequent falls Treatment diagnosis? (if different than referring diagnosis) M54.59, M79.605, M79.604, M62.81, R26.2, R29.3, R29.6  What was this (referring dx) caused by? Ongoing Issue  Lysle of Condition: Chronic (continuous duration > 3 months)   Laterality: Both  Current Functional Measure Score: Patient Specific Functional Scale eval avg:   Objective measurements identify impairments when they are compared to normal values, the uninvolved extremity, and prior level of function.  [x]  Yes  []  No  Objective assessment of functional ability: Severe functional limitations   Briefly describe symptoms: Pt indicated having complaints in back and can feel pain run down into legs.  Insidious onset.  Reported numbness in feet as well.  Reported Rt knee weakness/buckling.  Aggravating factors: walking, sitting, standing prolonged, bending, transfers.   Pt indicated pain feels constant, dull at times and throbbing as well.    Reported numbness in both feet about the same.  Not radicular in description.   Symptoms are reported severe and impact daily activity severely.   Pt indicated waking due to symptoms but then has trouble getting back to sleep.  Reported  every night.   How did symptoms start: Insidious onset  Average pain intensity:  Last 24 hours: up to 10/10 consistently   Past week: up to 10/10 consistently   How often does the pt experience symptoms? Constantly  How much have the symptoms interfered with usual daily activities? Quite a bit  How has condition changed since care began at this facility? NA - initial visit  In general, how is the patients overall health? Fair   BACK PAIN (STarT Back Screening Tool) Has pain spread down the leg(s) at some time in the last 2 weeks? yes Has there been pain in the shoulder or neck at some time in the last 2 weeks? no Has the pt only walked short distances because of back pain? yes Has patient dressed more slowly because of back pain in the past 2 weeks? yes Does patient think it's not safe for a person with this condition to be physically active? yes Does patient have worrying thoughts a lot of the time? yes Does patient feel back pain is terrible and will never get any better? yes Has patient stopped enjoying things they usually enjoy? yes Overall, how bothersome has back pain been in the last 2 weeks?  Very Much

## 2024-09-21 NOTE — Telephone Encounter (Signed)
 Called patient after 15 mins no show for appointment today.  Left message about missed appointment.  Today was No show #3 so cancelled additional appointments and left message about rescheduling per policy.    Ozell Silvan, PT, DPT, OCS, ATC 09/21/24  2:06 PM

## 2024-09-26 ENCOUNTER — Encounter: Admitting: Rehabilitative and Restorative Service Providers"

## 2024-09-28 ENCOUNTER — Encounter: Admitting: Rehabilitative and Restorative Service Providers"

## 2024-10-03 ENCOUNTER — Encounter: Admitting: Rehabilitative and Restorative Service Providers"

## 2024-10-05 ENCOUNTER — Encounter: Admitting: Rehabilitative and Restorative Service Providers"

## 2024-10-10 ENCOUNTER — Ambulatory Visit: Admitting: Family Medicine

## 2024-10-18 ENCOUNTER — Ambulatory Visit: Admitting: Family Medicine

## 2024-10-18 NOTE — Progress Notes (Deleted)
"       ° °  LILLETTE Ileana Collet, PhD, LAT, ATC acting as a scribe for Artist Lloyd, MD.  Kimiya Brunelle is a 74 y.o. female who presents to [facility] today for f/u LBP. Pt was last seen by Dr. Lloyd on 08/09/24 and was referred to PT, completing 2 visits (then no-showing remaining scheduled visits).  Today, pt reports ***  Dx testing: 05/14/24 L-spine MRI  05/13/24 L-spine CT  Pertinent review of systems: ***  Relevant historical information: ***   Exam:  There were no vitals taken for this visit. General: Well Developed, well nourished, and in no acute distress.   MSK: ***    Lab and Radiology Results No results found for this or any previous visit (from the past 72 hours). No results found.     Assessment and Plan: 74 y.o. female with ***   PDMP not reviewed this encounter. No orders of the defined types were placed in this encounter.  No orders of the defined types were placed in this encounter.    Discussed warning signs or symptoms. Please see discharge instructions. Patient expresses understanding.   ***  "

## 2024-10-28 ENCOUNTER — Ambulatory Visit: Admitting: Family

## 2024-10-28 NOTE — Progress Notes (Unsigned)
 "  Patient ID: Angela Gross, female    DOB: 09/13/1951, 74 y.o.   MRN: [id number]  No chief complaint on file.  Subjective:    Outpatient Medications Prior to Visit  Medication Sig Dispense Refill   ACCU-CHEK GUIDE test strip USE TO TEST BLOOD SUGARS ONCE WEEKLY     amitriptyline  (ELAVIL ) 25 MG tablet Take 1 tablet (25 mg total) by mouth at bedtime. 30 tablet 2   atorvastatin  (LIPITOR) 20 MG tablet Take 1 tablet (20 mg total) by mouth daily. 30 tablet 5   B Complex-C (B-COMPLEX WITH VITAMIN C ) tablet Take 1 tablet by mouth daily.     febuxostat  (ULORIC ) 40 MG tablet Take 1 tablet (40 mg total) by mouth daily. 30 tablet 5   fluticasone (FLONASE) 50 MCG/ACT nasal spray Place into both nostrils.     furosemide  (LASIX ) 20 MG tablet Take 1 tablet (20 mg total) by mouth every morning. 30 tablet 2   hydroxychloroquine (PLAQUENIL) 200 MG tablet 1 tab Orally twice a day for 30 days     losartan  (COZAAR ) 25 MG tablet TAKE 2 TABS IN THE MORNING AND 1 TAB IN THE EVENING. 270 tablet 1   Olopatadine  HCl 0.2 % SOLN Apply 1 drop to eye daily. 2.5 mL 3   potassium chloride  SA (KLOR-CON  M) 20 MEQ tablet Take 1 tablet (20 mEq total) by mouth as directed. Take each day you take a Lasix  pill.     Upadacitinib ER (RINVOQ) 15 MG TB24 Take by mouth.     Vitamin D , Cholecalciferol , 25 MCG (1000 UT) CAPS Take 25 mcg by mouth daily.     No facility-administered medications prior to visit.   Past Medical History:  Diagnosis Date   Anxiety    Breast cancer (HCC)    right (01/10/2014)   Chronic left shoulder pain    work related injury (01/10/2014)   Constipation 10/04/2014   Degenerative disc disease, cervical    Elevated blood pressure reading without diagnosis of hypertension 09/15/2023   Fibromyalgia    Full dentures    History of breast cancer in female 10/25/2013   Hot flashes, menopausal 04/04/2014   Hypertension    Osteoarthritis of both knees    Osteoporosis    Personal history of  chemotherapy    PONV (postoperative nausea and vomiting)    Raynaud disease    Rheumatoid arthritis (HCC)    elbows; fingers; toes   Syncope and collapse    4 times in the last year (01/10/2014)   Wears glasses    Past Surgical History:  Procedure Laterality Date   BREAST BIOPSY Right 10/2013   BREAST BIOPSY Left 07/22/2023   MM LT BREAST BX W LOC DEV 1ST LESION IMAGE BX SPEC STEREO GUIDE 07/22/2023 [facility]   BREAST BIOPSY Left 07/22/2023   MM LT BREAST BX W LOC DEV EA AD LESION IMG BX SPEC STEREO GUIDE 07/22/2023 [facility]   BREAST IMPLANT EXCHANGE Right 02/12/2016   Procedure: EXCHANGE SILICONE IMPLANT ON RIGHT, CAPSULORRAPHY AND PLACEMENT OF ALLODERM;  Surgeon: Earlis Ranks, MD;  Location: [facility];  Service: Plastics;  Laterality: Right;   BREAST RECONSTRUCTION Right 12/12/2014   Procedure: REVISION OF RIGHT RECONSTRUCTIVE BREAST WITH CAPSULORRHAPHY PLACEMENT OF SILCONE IMPLANTS AND LIPOFILLING TO RIGHT CHEST ;  Surgeon: Earlis Ranks, MD;  Location: [facility];  Service: Plastics;  Laterality: Right;   BREAST RECONSTRUCTION Right 03/06/2015   Procedure: RIGHT NIPPLE AEROLA COMPLEX RECONSTRUCTION AND LOCAL FLAP  WITH LIPO FILLING TO RIGHT BREAST,REMOVAL RIGHT CHEST PORT;  Surgeon: Earlis Ranks, MD;  Location: [facility];  Service: Plastics;  Laterality: Right;   BREAST RECONSTRUCTION Right 05/18/2018   Procedure: revision right breast reconstruction with silicone implant exchange;  Surgeon: Ranks Earlis, MD;  Location: [facility];  Service: Plastics;  Laterality: Right;   BREAST RECONSTRUCTION WITH PLACEMENT OF TISSUE EXPANDER AND FLEX HD (ACELLULAR HYDRATED DERMIS) Right 12/13/2013   Procedure: RIGHT BREAST RECONSTRUCTION WITH PLACEMENT OF TISSUE EXPANDER AND FLEX HD (ACELLULAR HYDRATED DERMIS);  Surgeon: Earlis Ranks, MD;  Location: [facility];  Service: Plastics;  Laterality:  Right;   CHOLECYSTECTOMY  1970's   COLONOSCOPY     LIPOMA EXCISION Bilateral 12/13/2013   Procedure: EXCISION OF LEFT BREAST KELOID AND RIGHT CHEST KELOID;  Surgeon: Earlis Ranks, MD;  Location: [facility];  Service: Plastics;  Laterality: Bilateral;   LIPOSUCTION WITH LIPOFILLING Right 09/26/2014   Procedure: LIPOSUCTION WITH LIPOFILLING;  Surgeon: Earlis Ranks, MD;  Location: [facility];  Service: Plastics;  Laterality: Right;   LIPOSUCTION WITH LIPOFILLING Right 12/12/2014   Procedure: LIPOSUCTION WITH LIPOFILLING;  Surgeon: Earlis Ranks, MD;  Location: [facility];  Service: Plastics;  Laterality: Right;   LIPOSUCTION WITH LIPOFILLING Right 03/06/2015   Procedure: ABDOMINAL LIPOSUCTION WITH LIPOFILLING TO RIGHT BREAST;  Surgeon: Earlis Ranks, MD;  Location: [facility];  Service: Plastics;  Laterality: Right;   MASTECTOMY Right 2015   MASTECTOMY W/ SENTINEL NODE BIOPSY Right 12/13/2013   Procedure: RIGHT TOTAL MASTECTOMY WITH SENTINEL LYMPH NODE BIOPSY;  Surgeon: Elon CHRISTELLA Pacini, MD;  Location: [facility];  Service: General;  Laterality: Right;   MASTOPEXY Left 09/26/2014   Procedure: MASTOPEXY;  Surgeon: Earlis Ranks, MD;  Location: [facility];  Service: Plastics;  Laterality: Left;   PORT-A-CATH REMOVAL Right 03/06/2015   Procedure: REMOVAL PORT-A-CATH;  Surgeon: Earlis Ranks, MD;  Location: [facility];  Service: Plastics;  Laterality: Right;   PORTACATH PLACEMENT Right 12/13/2013   Procedure: INSERTION PORT-A-CATH;  Surgeon: Elon CHRISTELLA Pacini, MD;  Location: [facility];  Service: General;  Laterality: Right;   REDUCTION MAMMAPLASTY Left 2015   REMOVAL OF BILATERAL TISSUE EXPANDERS WITH PLACEMENT OF BILATERAL BREAST IMPLANTS Bilateral 09/26/2014   Procedure: REMOVAL OF RIGHT TISSUE EXPANDER WITH PLACEMENT OF RIGHT BREAST IMPLANT LIPOFILLING TO RIGHT  BREAST/LEFT BREAST MASTOPEXY FOR SYMMETRY;  Surgeon: Earlis Ranks, MD;  Location: [facility];  Service: Plastics;  Laterality: Bilateral;   TISSUE EXPANDER PLACEMENT Right 01/10/2014   Procedure: Incision and Drainage Right Breast Seroma with TISSUE EXPANDER exchange;  Surgeon: Earlis Ranks, MD;  Location: [facility];  Service: Plastics;  Laterality: Right;   VAGINAL HYSTERECTOMY  1980   no [id number]   Allergies[1]    Objective:    Physical Exam Vitals and nursing note reviewed.  Constitutional:      Appearance: Normal appearance.  Cardiovascular:     Rate and Rhythm: Normal rate and regular rhythm.  Pulmonary:     Effort: Pulmonary effort is normal.     Breath sounds: Normal breath sounds.  Musculoskeletal:        General: Normal range of motion.  Skin:    General: Skin is warm and dry.  Neurological:     Mental Status: She is alert.  Psychiatric:        Mood and Affect: Mood normal.  Behavior: Behavior normal.    There were no vitals taken for this visit. Wt Readings from Last 3 Encounters:  08/09/24 157 lb 12.8 oz (71.6 kg)  08/03/24 161 lb 3.2 oz (73.1 kg)  05/20/24 162 lb (73.5 kg)       Angela Prevost, NP     [1]  Allergies Allergen Reactions   Tizanidine  Other (See Comments)    Syncope   Codeine Hives and Other (See Comments)    Altered mental status, numbness    Diazepam Other (See Comments)    Delusions, hallucinations    "

## 2024-10-31 NOTE — Progress Notes (Signed)
 Angela Gross                                          MRN: [id number]   10/31/2024   The [facility] Quality Team Specialist reviewed this patient medical record for the purposes of chart review for care gap closure. The following were reviewed: abstraction for care gap closure-glycemic status assessment.    [facility] Quality Team

## 2024-11-14 ENCOUNTER — Ambulatory Visit: Payer: Medicare Other
